# Patient Record
Sex: Male | Born: 1975
Health system: Southern US, Community
[De-identification: ages and names within clinical notes are randomized; demographics above are authoritative.]

## PROBLEM LIST (undated history)

## (undated) DIAGNOSIS — I5022 Chronic systolic (congestive) heart failure: Secondary | ICD-10-CM

## (undated) DIAGNOSIS — Z95810 Presence of automatic (implantable) cardiac defibrillator: Secondary | ICD-10-CM

## (undated) DIAGNOSIS — I509 Heart failure, unspecified: Secondary | ICD-10-CM

## (undated) DIAGNOSIS — G4733 Obstructive sleep apnea (adult) (pediatric): Secondary | ICD-10-CM

## (undated) DIAGNOSIS — I10 Essential (primary) hypertension: Secondary | ICD-10-CM

## (undated) DIAGNOSIS — Q210 Ventricular septal defect: Secondary | ICD-10-CM

## (undated) DIAGNOSIS — I428 Other cardiomyopathies: Secondary | ICD-10-CM

## (undated) HISTORY — DX: Ventricular septal defect: Q21.0

## (undated) HISTORY — PX: VSD REPAIR: SHX276

## (undated) HISTORY — DX: Heart failure, unspecified: I50.9

## (undated) HISTORY — DX: Obstructive sleep apnea (adult) (pediatric): G47.33

## (undated) HISTORY — DX: Essential (primary) hypertension: I10

## (undated) HISTORY — DX: Other cardiomyopathies: I42.8

---
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
## 1997-07-25 ENCOUNTER — Emergency Department (HOSPITAL_COMMUNITY): Admission: EM | Admit: 1997-07-25 | Discharge: 1997-07-25 | Payer: Self-pay | Admitting: Emergency Medicine

## 2006-02-13 ENCOUNTER — Encounter: Admission: RE | Admit: 2006-02-13 | Discharge: 2006-02-13 | Payer: Self-pay | Admitting: General Practice

## 2006-12-26 ENCOUNTER — Emergency Department (HOSPITAL_COMMUNITY): Admission: EM | Admit: 2006-12-26 | Discharge: 2006-12-26 | Payer: Self-pay | Admitting: Emergency Medicine

## 2007-02-28 ENCOUNTER — Inpatient Hospital Stay (HOSPITAL_COMMUNITY): Admission: EM | Admit: 2007-02-28 | Discharge: 2007-03-02 | Payer: Self-pay | Admitting: Emergency Medicine

## 2007-03-02 ENCOUNTER — Encounter (INDEPENDENT_AMBULATORY_CARE_PROVIDER_SITE_OTHER): Payer: Self-pay | Admitting: Emergency Medicine

## 2007-07-20 ENCOUNTER — Inpatient Hospital Stay (HOSPITAL_COMMUNITY): Admission: EM | Admit: 2007-07-20 | Discharge: 2007-07-27 | Payer: Self-pay | Admitting: Family Medicine

## 2007-08-26 ENCOUNTER — Ambulatory Visit (HOSPITAL_COMMUNITY): Admission: RE | Admit: 2007-08-26 | Discharge: 2007-08-26 | Payer: Self-pay | Admitting: Cardiology

## 2007-08-26 ENCOUNTER — Encounter (INDEPENDENT_AMBULATORY_CARE_PROVIDER_SITE_OTHER): Payer: Self-pay | Admitting: Cardiology

## 2009-02-12 ENCOUNTER — Observation Stay (HOSPITAL_COMMUNITY): Admission: EM | Admit: 2009-02-12 | Discharge: 2009-02-12 | Payer: Self-pay | Admitting: Emergency Medicine

## 2009-12-31 ENCOUNTER — Inpatient Hospital Stay (HOSPITAL_COMMUNITY): Admission: EM | Admit: 2009-12-31 | Discharge: 2010-01-04 | Payer: Self-pay | Admitting: Emergency Medicine

## 2009-12-31 ENCOUNTER — Emergency Department (HOSPITAL_COMMUNITY): Admission: EM | Admit: 2009-12-31 | Discharge: 2009-12-31 | Payer: Self-pay | Admitting: Emergency Medicine

## 2010-01-03 ENCOUNTER — Encounter (INDEPENDENT_AMBULATORY_CARE_PROVIDER_SITE_OTHER): Payer: Self-pay | Admitting: Internal Medicine

## 2010-06-27 LAB — CBC
HCT: 45.5 % (ref 39.0–52.0)
HCT: 48.3 % (ref 39.0–52.0)
Hemoglobin: 15.2 g/dL (ref 13.0–17.0)
Hemoglobin: 16 g/dL (ref 13.0–17.0)
MCHC: 33.4 g/dL (ref 30.0–36.0)
MCV: 97.6 fL (ref 78.0–100.0)
MCV: 97.6 fL (ref 78.0–100.0)
RBC: 4.95 MIL/uL (ref 4.22–5.81)
RDW: 13.1 % (ref 11.5–15.5)
WBC: 5.6 10*3/uL (ref 4.0–10.5)

## 2010-06-27 LAB — BASIC METABOLIC PANEL
BUN: 9 mg/dL (ref 6–23)
CO2: 30 mEq/L (ref 19–32)
CO2: 34 mEq/L — ABNORMAL HIGH (ref 19–32)
Calcium: 9.2 mg/dL (ref 8.4–10.5)
Chloride: 104 mEq/L (ref 96–112)
Chloride: 96 mEq/L (ref 96–112)
Creatinine, Ser: 1.01 mg/dL (ref 0.4–1.5)
GFR calc Af Amer: >60 mL/min (ref >60)
GFR calc Af Amer: >60 mL/min (ref >60)
GFR calc non Af Amer: >60 mL/min (ref >60)
Glucose, Bld: 159 mg/dL — ABNORMAL HIGH (ref 70–99)
Potassium: 3.8 mEq/L (ref 3.5–5.1)
Potassium: 4.6 mEq/L (ref 3.5–5.1)
Sodium: 139 mEq/L (ref 135–145)
Sodium: 140 mEq/L (ref 135–145)

## 2010-06-27 LAB — DIFFERENTIAL
Basophils Absolute: 0 10*3/uL (ref 0.0–0.1)
Basophils Relative: 0 % (ref 0–1)
Eosinophils Relative: 1 % (ref 0–5)
Eosinophils Relative: 1 % (ref 0–5)
Lymphocytes Relative: 15 % (ref 12–46)
Lymphocytes Relative: 29 % (ref 12–46)
Lymphs Abs: 0.8 10*3/uL (ref 0.7–4.0)
Monocytes Absolute: 0.6 10*3/uL (ref 0.1–1.0)
Monocytes Absolute: 0.6 10*3/uL (ref 0.1–1.0)
Monocytes Relative: 10 % (ref 3–12)
Monocytes Relative: 13 % — ABNORMAL HIGH (ref 3–12)

## 2010-06-27 LAB — POCT I-STAT, CHEM 8
BUN: 9 mg/dL (ref 6–23)
Calcium, Ion: 1.11 mmol/L — ABNORMAL LOW (ref 1.12–1.32)
Chloride: 100 mEq/L (ref 96–112)
Chloride: 110 mEq/L (ref 96–112)
Creatinine, Ser: 1.2 mg/dL (ref 0.4–1.5)
Glucose, Bld: 127 mg/dL — ABNORMAL HIGH (ref 70–99)
HCT: 42 % (ref 39.0–52.0)
Hemoglobin: 14.3 g/dL (ref 13.0–17.0)
Potassium: 2.6 mEq/L — CL (ref 3.5–5.1)
Sodium: 145 mEq/L (ref 135–145)
TCO2: 32 mmol/L (ref 0–100)

## 2010-06-27 LAB — GLUCOSE, CAPILLARY
Glucose-Capillary: 126 mg/dL — ABNORMAL HIGH (ref 70–99)
Glucose-Capillary: 131 mg/dL — ABNORMAL HIGH (ref 70–99)
Glucose-Capillary: 186 mg/dL — ABNORMAL HIGH (ref 70–99)
Glucose-Capillary: 187 mg/dL — ABNORMAL HIGH (ref 70–99)
Glucose-Capillary: 192 mg/dL — ABNORMAL HIGH (ref 70–99)
Glucose-Capillary: 212 mg/dL — ABNORMAL HIGH (ref 70–99)

## 2010-06-27 LAB — DIGOXIN LEVEL: Digoxin Level: <0.2 ng/mL — ABNORMAL LOW (ref 0.8–2.0)

## 2010-06-27 LAB — CK TOTAL AND CKMB (NOT AT ARMC): Total CK: 345 U/L — ABNORMAL HIGH (ref 7–232)

## 2010-06-27 LAB — MRSA PCR SCREENING: MRSA by PCR: POSITIVE — AB

## 2010-06-27 LAB — CARDIAC PANEL(CRET KIN+CKTOT+MB+TROPI)
CK, MB: 2.7 ng/mL (ref 0.3–4.0)
Troponin I: 0.03 ng/mL (ref 0.00–0.06)

## 2010-06-27 LAB — TROPONIN I: Troponin I: 0.01 ng/mL (ref 0.00–0.06)

## 2010-06-27 LAB — POTASSIUM: Potassium: 4.4 mEq/L (ref 3.5–5.1)

## 2010-07-17 LAB — GLUCOSE, CAPILLARY: Glucose-Capillary: 115 mg/dL — ABNORMAL HIGH (ref 70–99)

## 2010-07-17 LAB — WOUND CULTURE: Gram Stain: NONE SEEN

## 2010-08-27 NOTE — Discharge Summary (Signed)
Kevin Cardenas, Kevin Cardenas               ACCOUNT NO.:  [id number]   MEDICAL RECORD NO.:  [id number]          PATIENT TYPE:  INP   LOCATION:  4707                         FACILITY:  [facility]   PHYSICIAN:  Benito Mccreedy, M.D.DATE OF BIRTH:  1975-10-26   DATE OF ADMISSION:  02/28/2007  DATE OF DISCHARGE:                               DISCHARGE SUMMARY   CHIEF COMPLAINT:  Shortness of breath.   HISTORY OF PRESENT ILLNESS:  The patient is a 35 year old African  American gentleman with previous history of hole in heart, status post  repair as a child and hypertension.  He presented with 48 hour history  of shortness of breath.  Symptoms were proceeded by cold symptoms last  week when he walked in the rain.  The patient __________ at [employer]  where he works.  Shortness of breath had been stable, chest discomfort,  cough which is nonproductive and malaise.  He had not had any PND or  orthopnea.  He has felt a little bit dizzy.  On account of worsening  symptoms, the patient comes to the ED.  On admission to [facility] ED,  Dr. Dorie Rank, the patient was tachycardic with initial O2 saturation by  pulse oximetry, on room air, being 72%.  He was given oxygen by nasal  cannula with improvement in his oxygenation.  An x-ray was done, which  showed pneumonic infiltrates and the patient was admitted for further  evaluation and management in this regard.   PAST MEDICAL HISTORY:  As mentioned above.   PAST SURGICAL HISTORY:  The patient is status post open heart surgery as  a child, as stated above.   ALLERGIES:  HE DOES NOT HAVE ANY MEDICATION ALLERGIES, EXCEPT FOR  ASPIRIN INTOLERANCE WITH NAUSEA.   MEDICATIONS:  He takes:  1. Benicar 20 mg daily.  2. Lasix 25 mg daily p.r.n. for lower extremity edema.   FAMILY HISTORY:  Positive for heart disease in his family.   SOCIAL HISTORY:  The patient smokes cigarettes and drinks alcohol on a  social basis.   REVIEW OF SYSTEMS:  Twelve systems on 10  point systemic and __________  was unremarkable, except as note above.   PHYSICAL EXAMINATION:  VITAL SIGNS:  Blood pressure 102/67.  Temperature  was 99.4.  Pulse 109.  Respiratory rate was documented to be 22, but by  the time of my evaluation check by me was 18 per minute.  GENERAL:  Notable for morbid obese, African American gentleman, lying on  a stretcher in no acute cardiopulmonary distress.  HEENT:  Normocephalic, atraumatic.  Pupils equal, round and reactive to  light.  Extraocular movements intact.  Oropharynx moist.  NECK:  Supple.  No JVD.  LUNGS:  Diminished breath sounds at the bases.  Occasional rhonchi  without any wheezes.  CARDIOVASCULAR:  Tachycardic.  Rhythm appropriate with occasional  systolics.  No gallops.  A murmur appreciated in the log ER.  ABDOMEN:  Obese, soft, nontender.  Positive bowel sounds present.  EXTREMITIES:  No cyanosis and no edema noted.  NEURO:  The patient is alert and oriented x3.  No acute focal deficits.   LABORATORY DATA:  A 12-lead EKG showed fast heart sounds, tachycardia at  98 per minute and left anterior vesicular block upon __________  duration.  The patient had LVH.  X-ray of the chest as mentioned above  WBC count 8.9, hemoglobin 16.4, hematocrit 46.5, MCV 94.0, platelet  count 170.  Sodium 136, potassium 3.6, chloride 96, CO2 30, glucose 126,  BUN 8, creatinine 0.85, calcium 8.4, glomerular filtration rate  estimation for African American was more than 60 mL per minute.  B-type  natriuretic peptide was 164.0.   IMPRESSION:  1. Community acquired pneumonia.  2. Hypertensive heart disease with left ventricular hypertrophy.  3. Abnormal EKG with left anterior vesicular block of uncertain      duration.  4. Chest pain likely related to diagnosis number 1.  5. Hypertension.  6. Morbid obesity.   PLAN:  The patient was admitted for IV antibiotics, oxygen  supplementation, we will cycle his cardiac enzymes, we will check a 2D   echocardiogram because of his left ventricular hypertrophy, hypertensive  heart disease and abnormal EKG with left anterior vesicular block of  unknown duration with atypical chest pain, though likely related to his  pulmonary pathology.  The patient was seen and evaluated with blood  cultures x2 and further recommendations will be made according to  results.      Benito Mccreedy, M.D.  Electronically Signed     GO/MEDQ  D:  02/28/2007  T:  02/28/2007  Job:  [id number]

## 2010-08-27 NOTE — Cardiovascular Report (Signed)
Kevin Cardenas, Kevin Cardenas NO.:  [id number]   MEDICAL RECORD NO.:  [id number]          PATIENT TYPE:  INP   LOCATION:  2627                         FACILITY:  [facility]   PHYSICIAN:  Eden Lathe. Einar Gip, MD       DATE OF BIRTH:  Jan 01, 1976   DATE OF PROCEDURE:  07/22/2007  DATE OF DISCHARGE:                            CARDIAC CATHETERIZATION   PROCEDURE PERFORMED:  1. Right heart catheterization.  2. Left heart catheterization including:      a.     Left ventriculography.      b.     Selective right and left coronary arteriography.  3. Abdominal aortogram.   INDICATIONS:  Kevin Cardenas is a 35-year gentleman with history of  new-onset diabetes, history of hypertension, morbid obesity, obstructive  sleep apnea, who has found to have severe LV systolic dysfunction when  he was admitted to [facility] about 36 months ago.  He was  admitted with pneumonia that time.  He is now readmitted back with  shortness of breath and chest discomfort with positive cardiac markers.  Given his cardiomyopathy by echocardiogram and positive cardiac markers  suggestive of myocardial injury, he was brought to the cardiac  catheterization laboratory to evaluate his coronary anatomy.  He also  has history of ventricular septal defect status post pulmonary artery  banding followed by VSD repair and removal of the pulmonary artery band  at that time when he was 35 years of age.  Right heart catheterization is  being performed to evaluate for pulmonary hypertension.   HEMODYNAMIC DATA, RIGHT HEART CATHETERIZATION:  RA pressure 25/24, mean  22 mmHg.  Saturation 67%.  IVC saturation was 73%.   RV pressure 62/20 with end of pressure of 25 mmHg.   PA pressure 59/39, mean of 47 mmHg.  The PA saturation 56%.   Pulmonary capillary wedge pressure 37/41 with a mean of 37 mmHg.   Cardiac output was 6.21 with a cardiac index of 2.85 by FICK, and 4.95  and 2.27 respectively by thermodilution.   There was no shunting at the atrial or ventricular level.   HEMODYNAMIC DATA, LEFT HEART CATHETERIZATION:  Ventricular pressure was  981/19 with end-diastolic pressure of 30 mmHg.  Aortic pressure was  126/92 with a mean of 104 mmHg.  There was no pressure gradient across  the aortic valve.   ANGIOGRAPHIC DATA:  Left ventricle:  10 mL of contrast was injected and  the EF estimated to be around 35-40%.  Formal left ventriculography was  not performed given the fact that his wedge pressure mean was 37 mmHg.   Right coronary artery:  Right coronary artery is a large-caliber vessel  and a dominant vessel.  It is smooth and normal.   Left main coronary artery:  Left main coronary artery is a large-caliber  vessel that is smooth and normal.   Circumflex:  Circumflex is large-caliber vessel.  It is smooth and  normal.   Ramus intermediate:  Ramus intermedius is a large-caliber vessel.  It is  smooth and normal.   LAD:  LAD is large-caliber  vessel.  It is smooth and normal.   IMPRESSION:  1. Severe nonischemic dilated cardiomyopathy, ejection fraction of 35-      40%.  2. Moderate to severe pulmonary hypertension.  3. Congenital heart disease status post ventricular septal repair with      no residual left-to-right shunting by right heart catheterization.   RECOMMENDATIONS:  He will need medical therapy for now.  Should consider  placement of ICD for primary prevention of sudden cardiac death.  He did  have one episode of nonsustained ventricle tachycardia while he was in  the hospital.  Weight loss, exercise and diabetes control is indicated.   Right femoral arteriography was performed.  Arterial access closed with  StarClose with excellent hemostasis.   A total of 110 mL of contrast was utilized for diagnostic angiography.   TECHNIQUE OF THE PROCEDURE:  Under usual sterile precautions using a 7-  [place] right femoral venous and a 6-French right femoral arterial  access, a  balloon-tip Swan-Ganz catheter was advanced through the venous  access into the right atrium, right ventricle and pulmonary capillary  wedge position easily.  Right-sided hemodynamics were performed and then  the catheter was pulled out of body.   A 6-French multipurpose B2 catheter was advanced through the arterial  sheath into the left ventricle over a J-wire and left ventriculography  was performed in the RAO projection.  This same catheter was pulled into  the ascending aorta, right coronary artery selectively engaged and  angiography was performed.  Then the left main coronary artery was  selectively engaged and angiography was performed.  Then the catheter  was pulled back into the abdominal aorta and abdominal aortogram was  performed, then pulled out of the body.  The patient tolerated the  procedure.  Right femoral arteriography was performed through the  arterial access sheath and access closed with StarClose with excellent  hemostasis.  The venous access sheath was pulled in the catheterization  laboratory and adequate hemostasis also obtained.  The patient tolerated  the procedure.  No immediate complication.      Eden Lathe. Einar Gip, MD  Electronically Signed     JRG/MEDQ  D:  07/22/2007  T:  07/23/2007  Job:  [id number]   cc:   Nolene Ebbs, M.D.

## 2010-08-27 NOTE — H&P (Signed)
[name], Cardenas               ACCOUNT NO.:  [id number]   MEDICAL RECORD NO.:  [id number]          PATIENT TYPE:  INP   LOCATION:  2627                         FACILITY:  [facility]   PHYSICIAN:  Nolene Ebbs, M.D.    DATE OF BIRTH:  [birth year]   DATE OF ADMISSION:  07/20/2007  DATE OF DISCHARGE:                              HISTORY & PHYSICAL   PRESENTING COMPLAINTS:  Shortness of breath and leg swelling.   HISTORY OF PRESENT ILLNESS:  Kevin Cardenas is a 35 year old African American  gentleman with present medical history significant for congenital heart  disease, systemic hypertension and dyslipidemia.  He presented to the  emergency room at [facility] with 1-week history of swelling of  the legs and shortness of breath, progressively getting worse.  He  stated that he had been taking his medications and had not been using an  excessive amount of salt, but he noticed that his legs have been  progressively getting more swollen.  There was no pain and he denied  having any kind of injury.  He also noted that he became more easily  short of breath with mild exertion associated with two-pillow orthopnea  and paroxysmal nocturnal dyspnea.  He denied having any chest pain or  palpitations.  He had no nausea, diaphoresis, dizziness or hemoptysis.  He did not have any cough or sputum production and he had no fevers or  chills.  Due to progression of his symptoms, he decided to come to the  hospital.  At the emergency room his vital signs were stable.  He was  not in any acute respiratory or painful distress.  His initial  laboratory data with a chest x-ray that showed cardiomegaly with  congestive pattern and was thought to have developed congestive heart  failure and therefore admitted to the hospital for further evaluation  and appropriate management.   PAST MEDICAL HISTORY:  1. Congenital heart disease.  2. Systemic hypertension.  3. Dyslipidemia.   PAST SURGICAL HISTORY:   Heart surgery as a child to repair a hole in the  heart.   MEDICATION HISTORY:  1. Benicar/HCT 20/12.5 mg once a day.  2. Zocor 20 mg daily.  3. Aspirin 81 mg once a day.   ALLERGY HISTORY:  He says he is allergic to ASPIRIN, which upset his  stomach.   FAMILY AND SOCIAL HISTORY:  He lives with his mother and his sister.  He  is not married.  He has no children.  He works as a Engineer, maintenance (IT) at  [employer].  He denies any use of alcohol or illicit drugs.  He smokes  about half a pack of cigarettes daily.   REVIEW OF SYSTEMS:  CNS:  He denies any headaches, dizziness blurring of  vision, weakness of his extremities or slurring of speech.  CARDIAC:  As  above.  RESPIRATORY:  As above.  GI:  He has no nausea, vomiting,  diarrhea or constipation.  MUSCULOSKELETAL:  He had no joint pains or  joint swelling.  GU:  He has no dysuria, frequency or hematuria.   PHYSICAL EXAMINATION:  He is lying comfortably in the hospital bed at  about a 60-degree angle.  He is not in any acute respiratory or painful  distress.  VITAL SIGNS:  A blood pressure of 113/72, heart rate  of 88 and regular,  respiratory rate of 18, temperature 99.2, and O2 saturation on room air  initially on arrival was 88% on room air.  His pupils are equal and react to light and accommodation.  His oral  mucosa is moist.  NECK:  Supple with elevated jugular venous distention.  There is no  carotid bruit.  No cervical lymphadenopathy.  CHEST:  Reduced air entry with rales at the lung bases.  Heart sounds were not well-heard with no murmurs, no S3 gallops or rubs.  ABDOMEN:  Obese, soft and nontender, no masses.  Bowel sounds are  present.  EXTREMITIES:  2+ pitting edema of the foot, ankle and legs up to the  knees.  There is no calf tenderness or swelling.  Peripheral pulses are  present bilaterally.  CNS:  He is alert and oriented x3 with no focal neurological deficits.   LABORATORY DATA:  Chest x-ray shows moderate  congestive heart failure  with cardiomegaly.  BNP is 122.  I-STAT panel shows sodium 135,  potassium 4.5, chloride 95, BUN 16, creatinine 1.6.  Hemoglobin is 18.4,  hematocrit 54.  Point of care cardiac panel showed myoglobin 92.6, CK-MB  9.4 and troponins less than 0.05.  His white count is 7.4, hemoglobin  16.3, hematocrit 50.3, platelet count 207.  EKG shows normal sinus  rhythm with new T-wave inversions in the inferior and anterior leads.   ASSESSMENT:  Kevin Cardenas is a 27-year African American gentleman with  multiple coronary risk factors, who presented to the emergency room with  new symptoms and signs suggestive of congestive heart failure.   ADMISSION DIAGNOSES:  1. One congestive heart failure, decompensated.  2. Systemic hypertension, well-controlled.  3. History of dyslipidemia.  4. History of congenital heart disease, status post repair as a child.   PLAN OF CARE:  He will be admitted to a step-down unit.  He will be on  IV nitroglycerin at the 5 mcg, IV Lasix 40 mg q12 h., subcutaneous  Lovenox 40 mg daily, Protonix 40 mg once a day, aspirin 81 mg once a  day, lisinopril 10 mg once a day and Zocor 20 mg once a day.  A 2-D  echocardiogram be obtained.  He will be monitored on telemetry.  Serial  CKs and troponin will be performed q.8 h. x3.  Cardiology consult will  be requested for further evaluation.  Strict I&O's and daily weights  will be done.  He will be on a cardiac diet and activity as tolerated.  This plan of care has been discussed with him and his questions  answered.      Nolene Ebbs, M.D.  Electronically Signed     EA/MEDQ  D:  07/21/2007  T:  07/22/2007  Job:  [id number]

## 2010-08-30 NOTE — Discharge Summary (Signed)
NAMELEVI, KLAIBER NO.:  [id number]   MEDICAL RECORD NO.:  [id number]          PATIENT TYPE:  INP   LOCATION:  2010                         FACILITY:  [place] [facility]   PHYSICIAN:  Nolene Ebbs, M.D.    DATE OF BIRTH:  1975-10-07   DATE OF ADMISSION:  07/20/2007  DATE OF DISCHARGE:  07/27/2007                               DISCHARGE SUMMARY   HISTORY OF PRESENT ILLNESS:  Mr. Clydene Laming is a 35 year old African American  gentleman with past medical history significant for congenital heart  disease status post repair as a child, systemic hypertension, and  dyslipidemia.  He presented to the emergency room at [facility]  with 1-week history of shortness of breath and swelling of the legs  progressively getting worse.  He did not have any cough, sputum  production, fevers, or chills.  He had two-pillow orthopnea and  paroxysmal nocturnal dyspnea.  In the hospital emergency room, he was  not in acute respiratory or painful distress.  Initial laboratory data  showed chest x-ray with cardiomegaly and congestive heart.  He was,  therefore, admitted to the hospital for appropriate management.   HOSPITAL COURSE:  On admission, his BNP was 22.  He was placed on  intravenous nitroglycerin and intravenous Lasix, subcu Lovenox was given  for DVT prophylaxis and Protonix for GI prophylaxis with 2-D  echocardiogram, ejection fraction of 15-25% .  A Cardiology consult was  requested, and the patient was kindly seen by Dr. Adrian Prows who thought  that the patient had a cardiomyopathy of unknown etiology and also that  the patient to have obstructive sleep apnea, and wants to have a sleep  study done in the outpatient.  On July 22, 2007, he underwent cardiac  catheterization which revealed ejection fraction of 35-40% estimated.  He has no left-to-right shunting, he has moderate to severe pulmonary  hypertension, and severe nonischemic dilated cardiomyopathy.  Medical  therapy was  recommended and to be considered in future for biventricular  ICD.  On July 24, 2007, he had a low-grade temperature of 100.4 with  cough productive of yellowish sputum.  He was thought to have developed  acute bronchitis, and he was started on oral Avelox which he tolerated.  His Lasix was converted to p.o.  On July 27, 2007, he was feeling much  better and eager to go home.  His vital signs were stable.  His chest  was clear to auscultation.  Abdomen was benign.  She had no peripheral  leg edema, and was therefore considered stable for discharge home.   LABORATORY DATA:  His laboratory data were on July 26, 2007, sodium  123, potassium 3.8, chloride 90, bicarbonate of 34, BUN of 20,  creatinine 1.06, and glucose of 171.   CONDITION ON DISCHARGE:  Stable.   DISPOSITION:  His disposition was for home.  He is to follow up with me  in the office 1-2 weeks, with Dr. Einar Gip for sleep study on Aug 17, 2007.  He was advised not to return to work until evaluated in the office.  He  was discharged  home on oxygen 1-2 liters for ambulation and for  nighttime use.      Nolene Ebbs, M.D.  Electronically Signed     EA/MEDQ  D:  09/15/2007  T:  09/16/2007  Job:  [id number]

## 2010-09-22 ENCOUNTER — Emergency Department (HOSPITAL_COMMUNITY)
Admission: EM | Admit: 2010-09-22 | Discharge: 2010-09-22 | Disposition: A | Discharge status: Home / Self Care | Payer: BC Managed Care – PPO | Attending: Emergency Medicine | Admitting: Emergency Medicine

## 2010-09-22 ENCOUNTER — Emergency Department (HOSPITAL_COMMUNITY): Payer: BC Managed Care – PPO

## 2010-09-22 DIAGNOSIS — E119 Type 2 diabetes mellitus without complications: Secondary | ICD-10-CM | POA: Insufficient documentation

## 2010-09-22 DIAGNOSIS — H5789 Other specified disorders of eye and adnexa: Secondary | ICD-10-CM | POA: Insufficient documentation

## 2010-09-22 DIAGNOSIS — I10 Essential (primary) hypertension: Secondary | ICD-10-CM | POA: Insufficient documentation

## 2010-09-22 DIAGNOSIS — Z79899 Other long term (current) drug therapy: Secondary | ICD-10-CM | POA: Insufficient documentation

## 2010-09-22 DIAGNOSIS — Z760 Encounter for issue of repeat prescription: Secondary | ICD-10-CM | POA: Insufficient documentation

## 2010-09-22 DIAGNOSIS — I509 Heart failure, unspecified: Secondary | ICD-10-CM | POA: Insufficient documentation

## 2010-09-22 DIAGNOSIS — R42 Dizziness and giddiness: Secondary | ICD-10-CM | POA: Insufficient documentation

## 2010-09-22 DIAGNOSIS — R51 Headache: Secondary | ICD-10-CM | POA: Insufficient documentation

## 2010-09-22 LAB — DIFFERENTIAL
Eosinophils Relative: 0 % (ref 0–5)
Lymphocytes Relative: 17 % (ref 12–46)
Lymphs Abs: 1.3 10*3/uL (ref 0.7–4.0)
Monocytes Relative: 12 % (ref 3–12)

## 2010-09-22 LAB — POCT I-STAT, CHEM 8
BUN: 8 mg/dL (ref 6–23)
Chloride: 102 mEq/L (ref 96–112)
Creatinine, Ser: 0.9 mg/dL (ref 0.4–1.5)
Glucose, Bld: 114 mg/dL — ABNORMAL HIGH (ref 70–99)
HCT: 47 % (ref 39.0–52.0)
Potassium: 4.2 mEq/L (ref 3.5–5.1)

## 2010-09-22 LAB — CBC
HCT: 43.8 % (ref 39.0–52.0)
MCV: 93.8 fL (ref 78.0–100.0)
RDW: 12.6 % (ref 11.5–15.5)
WBC: 7.6 10*3/uL (ref 4.0–10.5)

## 2011-01-07 LAB — LIPID PANEL
Cholesterol: 105
HDL: 26 — ABNORMAL LOW
LDL Cholesterol: 64
Total CHOL/HDL Ratio: 4
Triglycerides: 73

## 2011-01-07 LAB — POCT I-STAT 3, VENOUS BLOOD GAS (G3P V)
Acid-Base Excess: 11 — ABNORMAL HIGH
Acid-Base Excess: 12 — ABNORMAL HIGH
Acid-Base Excess: 14 — ABNORMAL HIGH
Acid-Base Excess: 14 — ABNORMAL HIGH
Bicarbonate: 42.2 — ABNORMAL HIGH
Bicarbonate: 42.6 — ABNORMAL HIGH
O2 Saturation: 56
O2 Saturation: 67
O2 Saturation: 67
O2 Saturation: 73
Operator id: 250901
TCO2: 45
TCO2: 45
TCO2: 47
pH, Ven: 7.345 — ABNORMAL HIGH
pO2, Ven: 33
pO2, Ven: 39

## 2011-01-07 LAB — POCT I-STAT, CHEM 8
BUN: 16
Calcium, Ion: 0.94 — ABNORMAL LOW
Chloride: 95 — ABNORMAL LOW
Creatinine, Ser: 1.6 — ABNORMAL HIGH
Glucose, Bld: 145 — ABNORMAL HIGH

## 2011-01-07 LAB — POCT I-STAT 3, ART BLOOD GAS (G3+)
Acid-Base Excess: 15 — ABNORMAL HIGH
O2 Saturation: 89
TCO2: 46
pO2, Arterial: 58 — ABNORMAL LOW

## 2011-01-07 LAB — CBC
HCT: 48
HCT: 50.3
Hemoglobin: 15.6
Hemoglobin: 16.2
Hemoglobin: 16.3
MCHC: 32.7
MCHC: 33.5
MCHC: 33.7
MCV: 96.6
MCV: 96.8
Platelets: 178
Platelets: 192
RBC: 4.94
RBC: 4.98
RBC: 5.21
RDW: 14.3
RDW: 14.6
WBC: 7.4

## 2011-01-07 LAB — POCT CARDIAC MARKERS
Operator id: 277751
Troponin i, poc: <0.05

## 2011-01-07 LAB — CULTURE, BLOOD (ROUTINE X 2): Culture: NO GROWTH

## 2011-01-07 LAB — B-NATRIURETIC PEPTIDE (CONVERTED LAB)
Pro B Natriuretic peptide (BNP): 122 — ABNORMAL HIGH
Pro B Natriuretic peptide (BNP): 66
Pro B Natriuretic peptide (BNP): <30

## 2011-01-07 LAB — URINALYSIS, ROUTINE W REFLEX MICROSCOPIC
Glucose, UA: NEGATIVE
Hgb urine dipstick: NEGATIVE
Specific Gravity, Urine: 1.007
Urobilinogen, UA: 0.2

## 2011-01-07 LAB — DIFFERENTIAL
Eosinophils Relative: 1
Lymphocytes Relative: 18
Lymphs Abs: 1.3
Monocytes Absolute: 0.7
Monocytes Relative: 9

## 2011-01-07 LAB — BASIC METABOLIC PANEL
BUN: 20
CO2: 34 — ABNORMAL HIGH
CO2: 41 — ABNORMAL HIGH
CO2: 41 — ABNORMAL HIGH
Calcium: 8.3 — ABNORMAL LOW
Calcium: 8.6
Calcium: 8.8
Calcium: 8.9
Chloride: 87 — ABNORMAL LOW
Creatinine, Ser: 1.06
Creatinine, Ser: 1.11
Creatinine, Ser: 1.13
Creatinine, Ser: 1.2
GFR calc Af Amer: >60
GFR calc Af Amer: >60
GFR calc Af Amer: >60
GFR calc Af Amer: >60
GFR calc non Af Amer: >60
Glucose, Bld: 171 — ABNORMAL HIGH

## 2011-01-07 LAB — CARDIAC PANEL(CRET KIN+CKTOT+MB+TROPI): Total CK: 225

## 2011-01-07 LAB — URINE CULTURE
Culture: NO GROWTH
Special Requests: POSITIVE

## 2011-01-07 LAB — URINE DRUGS OF ABUSE SCREEN W ALC, ROUTINE (REF LAB)
Benzodiazepines.: NEGATIVE
Cocaine Metabolites: NEGATIVE
Opiate Screen, Urine: NEGATIVE
Propoxyphene: NEGATIVE

## 2011-01-07 LAB — APTT: aPTT: 29

## 2011-01-07 LAB — CK TOTAL AND CKMB (NOT AT ARMC): Total CK: 341 — ABNORMAL HIGH

## 2011-01-21 LAB — BASIC METABOLIC PANEL
BUN: 8
Calcium: 8.4
Chloride: 96
Creatinine, Ser: 0.85
GFR calc non Af Amer: >60
GFR calc non Af Amer: >60
Potassium: 3.9
Sodium: 139

## 2011-01-21 LAB — CBC
HCT: 45
Hemoglobin: 14.7
MCV: 94
Platelets: 170
WBC: 13 — ABNORMAL HIGH
WBC: 8.9

## 2011-01-21 LAB — LIPID PANEL
Cholesterol: 151
HDL: 37 — ABNORMAL LOW
LDL Cholesterol: 104 — ABNORMAL HIGH
Total CHOL/HDL Ratio: 4.1

## 2011-01-21 LAB — CARDIAC PANEL(CRET KIN+CKTOT+MB+TROPI)
CK, MB: 4.6 — ABNORMAL HIGH
Relative Index: 3.8 — ABNORMAL HIGH

## 2011-01-21 LAB — DIFFERENTIAL
Basophils Relative: 0
Eosinophils Absolute: 0.1 — ABNORMAL LOW
Lymphs Abs: 1.3
Neutro Abs: 6.7
Neutrophils Relative %: 75

## 2011-01-21 LAB — CK TOTAL AND CKMB (NOT AT ARMC)
Relative Index: 4 — ABNORMAL HIGH
Total CK: 189

## 2011-01-21 LAB — TROPONIN I: Troponin I: 0.04

## 2011-01-21 LAB — B-NATRIURETIC PEPTIDE (CONVERTED LAB): Pro B Natriuretic peptide (BNP): 164 — ABNORMAL HIGH

## 2011-01-21 LAB — CULTURE, BLOOD (ROUTINE X 2)

## 2011-01-24 LAB — CBC
MCHC: 34
MCV: 93.6
Platelets: 176
RBC: 4.62

## 2011-01-24 LAB — COMPREHENSIVE METABOLIC PANEL
Alkaline Phosphatase: 57
BUN: 9
Glucose, Bld: 96
Potassium: 3.2 — ABNORMAL LOW
Total Bilirubin: 0.9
Total Protein: 6.4

## 2011-01-24 LAB — POCT CARDIAC MARKERS
CKMB, poc: 2.4
Myoglobin, poc: 109

## 2011-01-24 LAB — DIFFERENTIAL
Basophils Absolute: 0
Basophils Relative: 0
Neutro Abs: 4.4
Neutrophils Relative %: 66

## 2011-01-28 ENCOUNTER — Emergency Department (HOSPITAL_COMMUNITY)
Admission: EM | Admit: 2011-01-28 | Discharge: 2011-01-28 | Disposition: A | Discharge status: Home / Self Care | Payer: BC Managed Care – PPO | Attending: Emergency Medicine | Admitting: Emergency Medicine

## 2011-01-28 DIAGNOSIS — E119 Type 2 diabetes mellitus without complications: Secondary | ICD-10-CM | POA: Insufficient documentation

## 2011-01-28 DIAGNOSIS — Z79899 Other long term (current) drug therapy: Secondary | ICD-10-CM | POA: Insufficient documentation

## 2011-01-28 DIAGNOSIS — I509 Heart failure, unspecified: Secondary | ICD-10-CM | POA: Insufficient documentation

## 2011-01-28 DIAGNOSIS — R05 Cough: Secondary | ICD-10-CM | POA: Insufficient documentation

## 2011-01-28 DIAGNOSIS — R059 Cough, unspecified: Secondary | ICD-10-CM | POA: Insufficient documentation

## 2011-01-28 DIAGNOSIS — I10 Essential (primary) hypertension: Secondary | ICD-10-CM | POA: Insufficient documentation

## 2011-01-28 DIAGNOSIS — J40 Bronchitis, not specified as acute or chronic: Secondary | ICD-10-CM | POA: Insufficient documentation

## 2011-01-30 ENCOUNTER — Encounter: Payer: Self-pay | Admitting: Internal Medicine

## 2011-01-30 ENCOUNTER — Ambulatory Visit (INDEPENDENT_AMBULATORY_CARE_PROVIDER_SITE_OTHER): Payer: BC Managed Care – PPO | Admitting: Internal Medicine

## 2011-01-30 ENCOUNTER — Ambulatory Visit (INDEPENDENT_AMBULATORY_CARE_PROVIDER_SITE_OTHER)
Admission: RE | Admit: 2011-01-30 | Discharge: 2011-01-30 | Disposition: A | Discharge status: Home / Self Care | Payer: BC Managed Care – PPO | Source: Ambulatory Visit | Attending: Internal Medicine | Admitting: Internal Medicine

## 2011-01-30 ENCOUNTER — Other Ambulatory Visit (INDEPENDENT_AMBULATORY_CARE_PROVIDER_SITE_OTHER): Payer: BC Managed Care – PPO

## 2011-01-30 VITALS — BP 120/86 | HR 88 | Temp 97.7°F | Resp 16 | Ht 66.0 in | Wt 202.0 lb

## 2011-01-30 DIAGNOSIS — IMO0001 Reserved for inherently not codable concepts without codable children: Secondary | ICD-10-CM

## 2011-01-30 DIAGNOSIS — Z23 Encounter for immunization: Secondary | ICD-10-CM

## 2011-01-30 DIAGNOSIS — I509 Heart failure, unspecified: Secondary | ICD-10-CM

## 2011-01-30 DIAGNOSIS — R7303 Prediabetes: Secondary | ICD-10-CM | POA: Insufficient documentation

## 2011-01-30 DIAGNOSIS — R011 Cardiac murmur, unspecified: Secondary | ICD-10-CM

## 2011-01-30 DIAGNOSIS — R05 Cough: Secondary | ICD-10-CM

## 2011-01-30 DIAGNOSIS — R059 Cough, unspecified: Secondary | ICD-10-CM

## 2011-01-30 LAB — COMPREHENSIVE METABOLIC PANEL
ALT: 19 U/L (ref 0–53)
AST: 19 U/L (ref 0–37)
Calcium: 8.9 mg/dL (ref 8.4–10.5)
Chloride: 105 mEq/L (ref 96–112)
Creatinine, Ser: 0.9 mg/dL (ref 0.4–1.5)
Potassium: 3.7 mEq/L (ref 3.5–5.1)

## 2011-01-30 LAB — LIPID PANEL
HDL: 47.4 mg/dL (ref >39.00)
LDL Cholesterol: 59 mg/dL (ref 0–99)
Total CHOL/HDL Ratio: 3
Triglycerides: 145 mg/dL (ref 0.0–149.0)
VLDL: 29 mg/dL (ref 0.0–40.0)

## 2011-01-30 LAB — HEMOGLOBIN A1C: Hgb A1c MFr Bld: 5.7 % (ref 4.6–6.5)

## 2011-01-30 LAB — CBC WITH DIFFERENTIAL/PLATELET
Basophils Absolute: 0 10*3/uL (ref 0.0–0.1)
Lymphocytes Relative: 36.8 % (ref 12.0–46.0)
Lymphs Abs: 1.4 10*3/uL (ref 0.7–4.0)
Monocytes Relative: 8.7 % (ref 3.0–12.0)
Platelets: 159 10*3/uL (ref 150.0–400.0)
RDW: 14.3 % (ref 11.5–14.6)

## 2011-01-30 LAB — BRAIN NATRIURETIC PEPTIDE: Pro B Natriuretic peptide (BNP): 68 pg/mL (ref 0.0–100.0)

## 2011-01-30 MED ORDER — FUROSEMIDE 20 MG PO TABS: 20.0000 mg | ORAL_TABLET | Freq: Two times a day (BID) | ORAL | Status: DC

## 2011-01-30 MED ORDER — SPIRONOLACTONE 25 MG PO TABS: 25.0000 mg | ORAL_TABLET | Freq: Every day | ORAL | Status: DC

## 2011-01-30 MED ORDER — SIMVASTATIN 20 MG PO TABS: 20.0000 mg | ORAL_TABLET | Freq: Every day | ORAL | Status: DC

## 2011-01-30 MED ORDER — POTASSIUM CHLORIDE 20 MEQ PO PACK: 20.0000 meq | PACK | Freq: Every day | ORAL | Status: DC

## 2011-01-30 MED ORDER — METFORMIN HCL 500 MG PO TABS: 500.0000 mg | ORAL_TABLET | Freq: Every day | ORAL | Status: DC

## 2011-01-30 MED ORDER — CARVEDILOL 6.25 MG PO TABS: 6.2500 mg | ORAL_TABLET | Freq: Two times a day (BID) | ORAL | Status: DC

## 2011-01-30 MED ORDER — DIGOXIN 250 MCG PO TABS: 250.0000 ug | ORAL_TABLET | Freq: Every day | ORAL | Status: DC

## 2011-01-30 NOTE — Assessment & Plan Note (Signed)
He needs an updated cardiology visit

## 2011-01-30 NOTE — Progress Notes (Signed)
Subjective:    Patient ID: Kevin Cardenas, male    DOB: [birth year], 35 y.o.   MRN: [id number]  HPI New to me this man is a poor historian and tells me that he was previously seeing a doctor in [place] but he does not know their name. He was seen in the ER  2 days ago for URI symptoms (cough productive of yellow phlegm, SOB, wheezing, ST) and was started on Zpak and continue with the same symptoms, he was given an inhaler but he has not started using it yet.  He tells me that he had open heart surgery as a child (?ASD repair) and has been treated for intermittent fluid retention since then with an ER visit for diuresis 4 months ago. He can't recall that last time he had any heart testing done.  He has had DM for about 15 years and does not know any recent lab results.   Review of Systems  Constitutional: Negative for fever, chills, diaphoresis, activity change, appetite change, fatigue and unexpected weight change.  HENT: Negative.   Eyes: Negative.   Respiratory: Positive for cough, shortness of breath and wheezing. Negative for apnea, choking, chest tightness and stridor.   Cardiovascular: Negative for chest pain, palpitations and leg swelling.  Gastrointestinal: Negative for nausea, vomiting, abdominal pain, diarrhea, constipation, blood in stool, abdominal distention, anal bleeding and rectal pain.  Genitourinary: Negative for dysuria, urgency, frequency, hematuria, flank pain, decreased urine volume, enuresis, difficulty urinating and genital sores.  Musculoskeletal: Negative for myalgias, back pain, joint swelling, arthralgias and gait problem.  Skin: Negative for color change, pallor, rash and wound.  Neurological: Negative for dizziness, tremors, seizures, syncope, facial asymmetry, speech difficulty, weakness, light-headedness, numbness and headaches.  Hematological: Negative for adenopathy. Does not bruise/bleed easily.  Psychiatric/Behavioral: Negative.        Objective:   Physical Exam  Vitals reviewed. Constitutional: He is oriented to person, place, and time. He appears well-developed and well-nourished. No distress.  HENT:  Head: Normocephalic and atraumatic.  Mouth/Throat: Oropharynx is clear and moist. No oropharyngeal exudate.  Eyes: Conjunctivae are normal. Right eye exhibits no discharge. Left eye exhibits no discharge. No scleral icterus.  Neck: Normal range of motion. Neck supple. No JVD present. No tracheal deviation present. No thyromegaly present.  Cardiovascular: Normal rate, regular rhythm and intact distal pulses.  Exam reveals no gallop and no friction rub.   Murmur heard. Pulmonary/Chest: No accessory muscle usage or stridor. Not tachypneic. No respiratory distress. He has no decreased breath sounds. He has no wheezes. He has rhonchi in the right upper field, the right middle field, the right lower field, the left upper field, the left middle field and the left lower field. He has no rales.  Abdominal: Soft. Bowel sounds are normal. He exhibits no distension and no mass. There is no tenderness. There is no rebound and no guarding.  Musculoskeletal: Normal range of motion. He exhibits no edema and no tenderness.  Lymphadenopathy:    He has no cervical adenopathy.  Neurological: He is alert and oriented to person, place, and time. He has normal reflexes. He displays normal reflexes. He exhibits normal muscle tone.  Skin: Skin is warm and dry. No rash noted. He is not diaphoretic. No erythema. No pallor.  Psychiatric: He has a normal mood and affect. His behavior is normal. Judgment and thought content normal.          Lab Results  Component Value Date   WBC 7.6 09/22/2010  HGB 16.0 09/22/2010   HCT 47.0 09/22/2010   PLT 154 09/22/2010   GLUCOSE 114* 09/22/2010   CHOL  Value: 105        ATP III CLASSIFICATION:  <200     mg/dL   Desirable  200-239  mg/dL   Borderline High  >=240    mg/dL   High 07/21/2007   TRIG 73 07/21/2007   HDL 26* 07/21/2007     LDLCALC  Value: 64        Total Cholesterol/HDL:CHD Risk Coronary Heart Disease Risk Table                     Men   Women  1/2 Average Risk   3.4   3.3 07/21/2007   ALT 67* 12/26/2006   AST 61* 12/26/2006   NA 135 09/22/2010   K 4.2 09/22/2010   CL 102 09/22/2010   CREATININE 0.90 09/22/2010   BUN 8 09/22/2010   CO2 34* 01/04/2010   TSH 0.659 12/31/2009   INR 1.1 07/22/2007   HGBA1C  Value: 6.8 (NOTE)   The ADA recommends the following therapeutic goals for glycemic   control related to Hgb A1C measurement:   Goal of Therapy:   < 7.0% Hgb A1C   Action Suggested:  > 8.0% Hgb A1C   Ref:  Diabetes Care, 22, Suppl. 1, 1999* 07/21/2007   Assessment & Plan:

## 2011-01-30 NOTE — Assessment & Plan Note (Signed)
I have asked him to start using the inhaler, I will check his CXR for edema, pna, etc.

## 2011-01-30 NOTE — Patient Instructions (Signed)
Diabetes, Type 2 Diabetes is a long-lasting (chronic) disease. In type 2 diabetes, the pancreas does not make enough insulin (a hormone), and the body does not respond normally to the insulin that is made. This type of diabetes was also previously called adult-onset diabetes. It usually occurs after the age of 54, but it can occur at any age.  CAUSES  Type 2 diabetes happens because the pancreasis not making enough insulin or your body has trouble using the insulin that your pancreas does make properly. SYMPTOMS   Drinking more than usual.   Urinating more than usual.   Blurred vision.   Dry, itchy skin.   Frequent infections.   Feeling more tired than usual (fatigue).  DIAGNOSIS The diagnosis of type 2 diabetes is usually made by one of the following tests:  Fasting blood glucose test. You will not eat for at least 8 hours and then take a blood test.   Random blood glucose test. Your blood glucose (sugar) is checked at any time of the day regardless of when you ate.   Oral glucose tolerance test (OGTT). Your blood glucose is measured after you have not eaten (fasted) and then after you drink a glucose containing beverage.  TREATMENT   Healthy eating.   Exercise.   Medicine, if needed.   Monitoring blood glucose.   Seeing your caregiver regularly.  HOME CARE INSTRUCTIONS   Check your blood glucose at least once a day. More frequent monitoring may be necessary, depending on your medicines and on how well your diabetes is controlled. Your caregiver will advise you.   Take your medicine as directed by your caregiver.   Do not smoke.   Make wise food choices. Ask your caregiver for information. Weight loss can improve your diabetes.   Learn about low blood glucose (hypoglycemia) and how to treat it.   Get your eyes checked regularly.   Have a yearly physical exam. Have your blood pressure checked and your blood and urine tested.   Wear a pendant or bracelet saying  that you have diabetes.   Check your feet every night for cuts, sores, blisters, and redness. Let your caregiver know if you have any problems.  SEEK MEDICAL CARE IF:   You have problems keeping your blood glucose in target range.   You have problems with your medicines.   You have symptoms of an illness that do not improve after 24 hours.   You have a sore or wound that is not healing.   You notice a change in vision or a new problem with your vision.   You have a fever.  MAKE SURE YOU:  Understand these instructions.   Will watch your condition.   Will get help right away if you are not doing well or get worse.  Document Released: 03/31/2005 Document Revised: 12/12/2010 Document Reviewed: 09/16/2010 [facility] Patient Information 2012 [place].

## 2011-01-30 NOTE — Assessment & Plan Note (Signed)
I will check his A1C and will monitor his renal function

## 2011-01-30 NOTE — Assessment & Plan Note (Addendum)
I will check a BNP to see if he is in fluid retention, his EKG today does show LVH but there is no acute pathology. Will check other labs as well and have asked him to see cardiology asap, I will refill all of his prior meds

## 2011-01-31 LAB — URINALYSIS, ROUTINE W REFLEX MICROSCOPIC
Hgb urine dipstick: NEGATIVE
Total Protein, Urine: NEGATIVE
Urine Glucose: NEGATIVE
pH: 6 (ref 5.0–8.0)

## 2011-01-31 LAB — DIGOXIN LEVEL: Digoxin Level: <0.1 ng/mL — ABNORMAL LOW (ref 0.8–2.0)

## 2011-02-04 ENCOUNTER — Encounter: Payer: Self-pay | Admitting: Cardiovascular Disease

## 2011-02-06 ENCOUNTER — Ambulatory Visit (INDEPENDENT_AMBULATORY_CARE_PROVIDER_SITE_OTHER): Payer: BC Managed Care – PPO | Admitting: Cardiovascular Disease

## 2011-02-06 ENCOUNTER — Encounter: Payer: Self-pay | Admitting: Cardiovascular Disease

## 2011-02-06 VITALS — BP 131/81 | HR 89 | Ht 66.0 in | Wt 199.8 lb

## 2011-02-06 DIAGNOSIS — I272 Pulmonary hypertension, unspecified: Secondary | ICD-10-CM | POA: Insufficient documentation

## 2011-02-06 DIAGNOSIS — Q210 Ventricular septal defect: Secondary | ICD-10-CM

## 2011-02-06 DIAGNOSIS — I2789 Other specified pulmonary heart diseases: Secondary | ICD-10-CM

## 2011-02-06 DIAGNOSIS — I428 Other cardiomyopathies: Secondary | ICD-10-CM | POA: Insufficient documentation

## 2011-02-06 DIAGNOSIS — I509 Heart failure, unspecified: Secondary | ICD-10-CM

## 2011-02-06 DIAGNOSIS — I10 Essential (primary) hypertension: Secondary | ICD-10-CM | POA: Insufficient documentation

## 2011-02-06 DIAGNOSIS — I5022 Chronic systolic (congestive) heart failure: Secondary | ICD-10-CM | POA: Insufficient documentation

## 2011-02-06 MED ORDER — LISINOPRIL 5 MG PO TABS: 5.0000 mg | ORAL_TABLET | Freq: Every day | ORAL | Status: DC

## 2011-02-06 NOTE — Assessment & Plan Note (Signed)
No assessment since 2009. Will refer to Dr. Haroldine Laws.

## 2011-02-06 NOTE — Progress Notes (Signed)
History of Present Illness:35 yo male with history of VSD repair as a child, DM, HTN, obesity and obstructive sleep apnea referred today to establish cardiology care. He was recently seen by Dr. Ronnald Ramp in primary care and was referred to our office. He is a poor historian but review of records from [facility] over past few years shines some light on his past. He had a VSD repair and removal of pulmonary artery band at age 51. He had a cardiac cath in April 2009 per Dr. Einar Gip that showed PA pressure 59/39. Pulmonary capillary wedge pressure was 37. Cardiac output was 6.21 L/min. There were no shunts. His LVEF was 35-40%. Coronary arteries were normal. He was admitted to [facility] September 2011 with SOB/volume overload and was diuresed. Echo September 2011 with LVEF of 20-25%. There was severe LVH. He has had no cardiac workup since then. There is some mention in the chart of non-sustained VT. No discussions about ICD in the past.   He is doing well. He denies any chest pain, SOB, or palpitations. His weight has been stable. No lower extremity edema. He is working without any problems.    Past Medical History  Diagnosis Date  . Diabetes mellitus   . Hypertension   . CHF (congestive heart failure)   . VSD (ventricular septal defect)   . Non-ischemic cardiomyopathy   . Obstructive sleep apnea     Past Surgical History  Procedure Date  . Vsd repair     Current Outpatient Prescriptions  Medication Sig Dispense Refill  . albuterol (PROVENTIL HFA;VENTOLIN HFA) 108 (90 BASE) MCG/ACT inhaler Inhale 2 puffs into the lungs every 6 (six) hours as needed.        . carvedilol (COREG) 6.25 MG tablet Take 1 tablet (6.25 mg total) by mouth 2 (two) times daily with a meal.  60 tablet  11  . digoxin (LANOXIN) 0.25 MG tablet Take 1 tablet (250 mcg total) by mouth daily.  30 tablet  11  . furosemide (LASIX) 20 MG tablet Take 1 tablet (20 mg total) by mouth 2 (two) times daily.  60 tablet  11  .  metFORMIN (GLUCOPHAGE) 500 MG tablet Take 1 tablet (500 mg total) by mouth daily.  30 tablet  11  . potassium chloride (KLOR-CON) 20 MEQ packet Take 20 mEq by mouth daily.  30 packet  11  . simvastatin (ZOCOR) 20 MG tablet Take 1 tablet (20 mg total) by mouth at bedtime.  30 tablet  11  . spironolactone (ALDACTONE) 25 MG tablet Take 1 tablet (25 mg total) by mouth daily.  30 tablet  11    No Known Allergies  History   Social History  . Marital Status: Single    Spouse Name: N/A    Number of Children: 0  . Years of Education: N/A   Occupational History  . Works at [employer]  . Smoking status: Former Smoker -- 0.3 packs/day for 15 years    Types: Cigarettes  . Smokeless tobacco: Never Used  . Alcohol Use: 3.0 oz/week    5 Cans of beer per week  . Drug Use: No  . Sexually Active: Yes   Other Topics Concern  . Not on file   Social History Narrative   Caffienated drinks-noSeat belt use often-noRegular Exercise-Smoke alarm in the home-yesFirearms/guns in the home-noHistory of physical abuse-no    Family History  Problem Relation Age of Onset  . Lung cancer  Father   . Stroke Father   . Diabetes Maternal Grandfather   . Diabetes Paternal Grandmother     Review of Systems:  As stated in the HPI and otherwise negative.   BP 131/81  Pulse 89  Ht _0  (1.676 m)  Wt 199 lb 12.8 oz (90.629 kg)  BMI 32.25 kg/m2  Physical Examination: General: Well developed, well nourished, NAD HEENT: OP clear, mucus membranes moist SKIN: warm, dry. No rashes. Neuro: No focal deficits Musculoskeletal: Muscle strength 5/5 all ext Psychiatric: Mood and affect normal Neck: No JVD, no carotid bruits, no thyromegaly, no lymphadenopathy. Lungs:Clear bilaterally, no wheezes, rhonci, crackles Cardiovascular: Regular rate and rhythm. No murmurs. +S3 gallop. No rubs. Abdomen:Soft. Bowel sounds present. Non-tender.  Extremities: No lower extremity edema. Pulses are 2 +  in the bilateral DP/PT.

## 2011-02-06 NOTE — Assessment & Plan Note (Signed)
Volume status is ok. Only change in therapy is addition of Lisinopril.

## 2011-02-06 NOTE — Assessment & Plan Note (Signed)
Will continue his current medical regimen. Volume ok with Lasix 40 mg po once daily. Will add Lisinopril 5 mg po Qdaily. Will order echocardiogram to assess LV function. He will need to be considered for an ICD in the future. Given his non-ischemic CM and his pulmonary HTN with h/o congenital heart disease and prior VSD repair, I will refer him to the [facility] to see Dr. Haroldine Laws. He is in agreement with this plan.

## 2011-02-06 NOTE — Patient Instructions (Signed)
Your physician recommends that you schedule a follow-up appointment in 4 weeks with Dr. Haroldine Laws in the [facility].   Your physician has requested that you have an echocardiogram. Echocardiography is a painless test that uses sound waves to create images of your heart. It provides your doctor with information about the size and shape of your heart and how well your heart's chambers and valves are working. This procedure takes approximately one hour. There are no restrictions for this procedure.  Your physician has recommended you make the following change in your medication: Start Lisinopril 5 mg by mouth daily.

## 2011-02-06 NOTE — Assessment & Plan Note (Signed)
No evidence of shunting on right heart cath April 2009. No VSD present on echo September 2011. Repeat echo.

## 2011-02-13 ENCOUNTER — Ambulatory Visit (HOSPITAL_COMMUNITY): Payer: BC Managed Care – PPO | Attending: Cardiovascular Disease | Admitting: Radiology

## 2011-02-13 DIAGNOSIS — I428 Other cardiomyopathies: Secondary | ICD-10-CM

## 2011-02-13 DIAGNOSIS — I10 Essential (primary) hypertension: Secondary | ICD-10-CM | POA: Insufficient documentation

## 2011-02-13 DIAGNOSIS — I059 Rheumatic mitral valve disease, unspecified: Secondary | ICD-10-CM | POA: Insufficient documentation

## 2011-02-13 DIAGNOSIS — I379 Nonrheumatic pulmonary valve disorder, unspecified: Secondary | ICD-10-CM | POA: Insufficient documentation

## 2011-02-13 DIAGNOSIS — E119 Type 2 diabetes mellitus without complications: Secondary | ICD-10-CM | POA: Insufficient documentation

## 2011-02-13 DIAGNOSIS — R011 Cardiac murmur, unspecified: Secondary | ICD-10-CM | POA: Insufficient documentation

## 2011-02-13 DIAGNOSIS — I079 Rheumatic tricuspid valve disease, unspecified: Secondary | ICD-10-CM | POA: Insufficient documentation

## 2011-02-20 ENCOUNTER — Ambulatory Visit: Payer: BC Managed Care – PPO | Admitting: Internal Medicine

## 2011-02-20 DIAGNOSIS — Z0289 Encounter for other administrative examinations: Secondary | ICD-10-CM

## 2011-03-11 ENCOUNTER — Ambulatory Visit (HOSPITAL_COMMUNITY)
Admission: RE | Admit: 2011-03-11 | Discharge: 2011-03-11 | Disposition: A | Discharge status: Home / Self Care | Payer: BC Managed Care – PPO | Source: Ambulatory Visit | Attending: Internal Medicine | Admitting: Internal Medicine

## 2011-03-11 ENCOUNTER — Other Ambulatory Visit (HOSPITAL_COMMUNITY): Payer: Self-pay | Admitting: Pharmacist

## 2011-03-11 ENCOUNTER — Encounter (HOSPITAL_COMMUNITY): Payer: Self-pay

## 2011-03-11 VITALS — BP 114/68 | HR 81 | Wt 198.0 lb

## 2011-03-11 DIAGNOSIS — I509 Heart failure, unspecified: Secondary | ICD-10-CM

## 2011-03-11 DIAGNOSIS — I5022 Chronic systolic (congestive) heart failure: Secondary | ICD-10-CM | POA: Insufficient documentation

## 2011-03-11 DIAGNOSIS — I428 Other cardiomyopathies: Secondary | ICD-10-CM

## 2011-03-11 LAB — BASIC METABOLIC PANEL
BUN: 15 mg/dL (ref 6–23)
Calcium: 8.7 mg/dL (ref 8.4–10.5)
GFR calc Af Amer: >90 mL/min (ref >90)
GFR calc non Af Amer: >90 mL/min (ref >90)
Potassium: 4.2 mEq/L (ref 3.5–5.1)

## 2011-03-11 MED ORDER — CARVEDILOL 6.25 MG PO TABS: 9.3750 mg | ORAL_TABLET | Freq: Two times a day (BID) | ORAL | Status: DC

## 2011-03-11 MED ORDER — ALBUTEROL SULFATE HFA 108 (90 BASE) MCG/ACT IN AERS: 2.0000 | INHALATION_SPRAY | Freq: Four times a day (QID) | RESPIRATORY_TRACT | Status: DC | PRN

## 2011-03-11 MED ORDER — LISINOPRIL 5 MG PO TABS: 5.0000 mg | ORAL_TABLET | Freq: Two times a day (BID) | ORAL | Status: DC

## 2011-03-11 NOTE — Assessment & Plan Note (Addendum)
Discussed role of HF clinic in full with the patient and his wife.  Volume status looks good today.  NYHA I-II.  Currently patient is not on optimal doses of his HF medications therefore at this time will titrate coreg to 9.375 mg BID and lisinopril to 5 mg BID.  Will continue to optimize dosages prior to evaluation for ICD implantation.  Currently with NYHA I-II symptoms digoxin is not recommended, will discontinue at this time.  Discussed need for daily weights, scale given and sliding scale lasix discussed.  Reviewed low sodium diet and fluid restrictions.  Patient and his wife voiced understanding.  Patient seen and examined with Leone Haven PA-C. We discussed all aspects of the encounter. I agree with the assessment and plan as stated above.   Greater than 50 min spent with the patient and 50% of the time spent educating about diet, weight and medication compliance.

## 2011-03-11 NOTE — Progress Notes (Signed)
HPI:  Kevin Cardenas is a 35 y.o. gentlemen with history of VSD repair and removal of a pulmonary artery band at age 66, DM, HTN, systolic heart failure, NICM and OSA with CPAP but doesn't wear it all the time.  Cardiac cath in April 2009 per Dr. Einar Gip that showed His LVEF was 35-40%. Coronary arteries were normal. RA pressure 25/24, mean 22 mmHg.  Saturation 67%.  IVC saturation was 73%. RV pressure 62/20 with end of pressure of 25 mmHg. PA pressure 59/39, mean of 47 mmHg.  The PA saturation 56%. Pulmonary capillary wedge pressure 37/41 with a mean of 37 mmHg. Cardiac output was 6.21 with a cardiac index of 2.85 by FICK, and 4.95 and 2.27 respectively by thermodilution.  There was no shunting at the atrial or ventricular level.  Echo 9/11: LVEF 20-25%  Echo November 1st, 2012.  LVEF 30-35% Diffuse hypokinesis and mild LVH.  RV systolic mildly reduced.  LA mildly dilated.  Pulmonic valve with mild stenosis.    The patient has been referred by Dr. Angelena Form for further management of his heart failure.  He feels well today.  He occasionally takes extra lasix when he feels dyspneic or has lower extremity edema.  He took an extra lasix last week.  He does not weigh himself daily but is agreeable to getting a scale.  He has been out of his digoxin for several days, he is otherwise compliant with his medications.  He denies SOB/othopnea or PND.  He tries to wear his CPAP but isn't always compliant. No CP.    Recently married to high school sweetheart who wants to keep the patient on track with his medications and diet.  He continues to use tobacco occasionally and rarely uses ETOH.  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  Occasionally is SOB after work.      Review of Systems:     Cardiac Review of Systems: {Y] = yes _0  = no  Chest Pain [    ]  Resting SOB [   ] Exertional SOB  [  ]  Orthopnea [  ]   Pedal Edema [   ]    Palpitations [  ] Syncope   [  ]   Presyncope [   ]  General Review of Systems: [Y] = yes [  ]=no Constitional: recent weight change [  ]; anorexia [  ]; fatigue [  ]; nausea [  ]; night sweats [  ]; fever [  ]; or chills [  ];                                                                                                                                          Dental: poor dentition[  ];   Eye : blurred vision [  ]; diplopia [   ];  vision changes [  ];  Amaurosis fugax[  ]; Resp: cough [  ];  wheezing[  ];  hemoptysis[  ]; shortness of breath[  ]; paroxysmal nocturnal dyspnea[  ]; dyspnea on exertion[  ]; or orthopnea[  ];  GI:  gallstones[  ], vomiting[  ];  dysphagia[  ]; melena[  ];  hematochezia [  ]; heartburn[  ];   Hx of  Colonoscopy[  ]; GU: kidney stones [  ]; hematuria[  ];   dysuria [  ];  nocturia[  ];  history of     obstruction [  ];                 Skin: rash, swelling[  ];, hair loss[  ];  peripheral edema[  ];  or itching[  ]; Musculosketetal: myalgias[  ];  joint swelling[  ];  joint erythema[  ];  joint pain[  ];  back pain[  ];  Heme/Lymph: bruising[  ];  bleeding[  ];  anemia[  ];  Neuro: TIA[  ];  headaches[  ];  stroke[  ];  vertigo[  ];  seizures[  ];   paresthesias[  ];  difficulty walking[  ];  Psych:depression[  ]; anxiety[  ];  Endocrine: diabetes[  ];  thyroid dysfunction[  ];  Immunizations: Flu [  ]; Pneumococcal[  ];  Other:    Past Medical History  Diagnosis Date  . Diabetes mellitus   . Hypertension   . CHF (congestive heart failure)   . VSD (ventricular septal defect)   . Non-ischemic cardiomyopathy   . Obstructive sleep apnea     Current Outpatient Prescriptions  Medication Sig Dispense Refill  . acetaminophen (TYLENOL) 500 MG tablet Take 1,000 mg by mouth as needed.        Marland Kitchen albuterol (PROVENTIL HFA;VENTOLIN HFA) 108 (90 BASE) MCG/ACT inhaler Inhale 2 puffs into the lungs every 6 (six) hours as needed.        . carvedilol (COREG) 6.25 MG tablet Take 1 tablet (6.25 mg  total) by mouth 2 (two) times daily with a meal.  60 tablet  11  . digoxin (LANOXIN) 0.25 MG tablet Take 1 tablet (250 mcg total) by mouth daily.  30 tablet  11  . furosemide (LASIX) 20 MG tablet Take 1 tablet (20 mg total) by mouth 2 (two) times daily.  60 tablet  11  . lisinopril (PRINIVIL,ZESTRIL) 5 MG tablet Take 1 tablet (5 mg total) by mouth daily.  30 tablet  11  . metFORMIN (GLUCOPHAGE) 500 MG tablet Take 1 tablet (500 mg total) by mouth daily.  30 tablet  11  . potassium chloride SA (K-DUR,KLOR-CON) 20 MEQ tablet Take 20 mEq by mouth daily.        . simvastatin (ZOCOR) 20 MG tablet Take 1 tablet (20 mg total) by mouth at bedtime.  30 tablet  11  . spironolactone (ALDACTONE) 25 MG tablet Take 1 tablet (25 mg total) by mouth daily.  30 tablet  11     No Known Allergies  History   Social History  . Marital Status: Single    Spouse Name: Recently Married     Number of Children: 0  . Years of Education: N/A   Occupational History  . 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    Social History Main Topics  . Smoking status: Current Some Day Smoker -- 0.3 packs/day for 15 years  Types: Cigarettes  . Smokeless tobacco: Never Used   Comment: Smokes depends on who is around him.    . Alcohol Use: 0.0 oz/week     Rarely   . Drug Use: No  . Sexually Active: Yes   Other Topics Concern  . Not on file   Social History Narrative   Caffienated drinks-noSeat belt use often-noRegular Exercise-Smoke alarm in the home-yesFirearms/guns in the home-noHistory of physical abuse-no    Family History  Problem Relation Age of Onset  . Lung cancer Father   . Stroke Father   . Diabetes Maternal Grandfather   . Diabetes Paternal Grandmother   . Stroke Mother     PHYSICAL EXAM: Filed Vitals:   03/11/11 1113  BP: 98/60  Pulse: 81  Wt 198  General:  Well appearing. No respiratory difficulty HEENT: normal Neck: supple. JVD 6-7.  Carotids 2+ bilat; no  bruits. No lymphadenopathy or thryomegaly appreciated. Cor: PMI nondisplaced. Regular rate & rhythm. No rubs, gallops or murmurs. Lungs: clear Abdomen: soft, nontender, nondistended. No hepatosplenomegaly. No bruits or masses. Good bowel sounds. Extremities: no cyanosis, clubbing, rash, edema Neuro: alert & oriented x 3, cranial nerves grossly intact. moves all 4 extremities w/o difficulty. Affect pleasant.    ASSESSMENT & PLAN:

## 2011-03-11 NOTE — Patient Instructions (Signed)
Stop digoxin.  Increase Carvedilol 9.375 mg (1.5 tabs) twice daily.  Increase Lisinopril 5 mg twice daily.  Weigh yourself daily, call if increase in 3-4 lbs over night.    Follow up in 4 weeks.   Labs today.

## 2011-03-14 NOTE — Progress Notes (Signed)
Patient seen and examined with Leone Haven PA-C. We discussed all aspects of the encounter. I agree with the assessment and plan as stated above.

## 2011-04-22 ENCOUNTER — Ambulatory Visit (HOSPITAL_COMMUNITY)
Admission: RE | Admit: 2011-04-22 | Discharge: 2011-04-22 | Disposition: A | Discharge status: Home / Self Care | Payer: BC Managed Care – PPO | Source: Ambulatory Visit | Attending: Internal Medicine | Admitting: Internal Medicine

## 2011-04-22 ENCOUNTER — Encounter (HOSPITAL_COMMUNITY): Payer: Self-pay

## 2011-04-22 VITALS — BP 110/74 | HR 79 | Wt 195.8 lb

## 2011-04-22 DIAGNOSIS — I5022 Chronic systolic (congestive) heart failure: Secondary | ICD-10-CM

## 2011-04-22 DIAGNOSIS — I509 Heart failure, unspecified: Secondary | ICD-10-CM

## 2011-04-22 LAB — BASIC METABOLIC PANEL
BUN: 10 mg/dL (ref 6–23)
Calcium: 9.1 mg/dL (ref 8.4–10.5)
Chloride: 106 mEq/L (ref 96–112)
Creatinine, Ser: 0.85 mg/dL (ref 0.50–1.35)
GFR calc Af Amer: >90 mL/min (ref >90)
GFR calc non Af Amer: >90 mL/min (ref >90)

## 2011-04-22 MED ORDER — CARVEDILOL 6.25 MG PO TABS: 12.5000 mg | ORAL_TABLET | Freq: Two times a day (BID) | ORAL | Status: DC

## 2011-04-22 MED ORDER — LISINOPRIL 5 MG PO TABS: 5.0000 mg | ORAL_TABLET | Freq: Two times a day (BID) | ORAL | Status: DC

## 2011-04-22 NOTE — Progress Notes (Signed)
HPI:  Kevin Cardenas is a 36 y.o. gentlemen with history of VSD repair and removal of a pulmonary artery band at age 6, DM, HTN, systolic heart failure, NICM and OSA with CPAP but doesn't wear it all the time.   Cardiac cath in April 2009 per Dr. Einar Gip that showed His LVEF was 35-40%. Coronary arteries were normal. RA pressure 25/24, mean 22 mmHg. Saturation 67%. IVC saturation was 73%. RV pressure 62/20 with end of pressure of 25 mmHg. PA pressure 59/39, mean of 47 mmHg. The PA saturation 56%. Pulmonary capillary wedge pressure 37/41 with a mean of 37 mmHg. Cardiac output was 6.21 with a cardiac index of 2.85 by FICK, and 4.95 and 2.27 respectively by thermodilution. There was no shunting at the atrial or ventricular level.   Echo 9/11: LVEF 20-25%   Echo November 1st, 2012. LVEF 30-35% Diffuse hypokinesis and mild LVH. RV systolic mildly reduced. LA mildly dilated. Pulmonic valve with mild stenosis.   The patient has been referred by Dr. Angelena Form for further management of his heart failure. Recently married to high school sweetheart who wants to keep the patient on track with his medications and diet. He continues to use tobacco occasionally and rarely uses ETOH. He works at [employer].    He returns for follow up today.  Coreg increased 9.375 mg BID, potassium stopped last visit.  He was suppose to increase lisinopril as well but this did not happen.  He feels well.  No c/o SOB/orthopnea/PND/CP.  He has to walk up the hill to get to the bus stop and he has no issues with this.  He has sliding scale lasix but has not needed this since last visit.  He weighs daily, stable at home.  He is compliant with all meds.  No extra lasix.  Weight steady at home.  Exercising some, walking and lifting weights.     ROS: All systems negative except as listed in HPI, PMH and Problem List.  Past Medical History  Diagnosis Date  . Diabetes mellitus   . Hypertension   . VSD (ventricular septal defect)   .  Non-ischemic cardiomyopathy   . Obstructive sleep apnea   . CHF (congestive heart failure)     Systolic, EF 38% per echo 11/12    Current Outpatient Prescriptions  Medication Sig Dispense Refill  . acetaminophen (TYLENOL) 500 MG tablet Take 1,000 mg by mouth as needed.        Marland Kitchen aspirin 81 MG tablet Take 81 mg by mouth daily.        . carvedilol (COREG) 6.25 MG tablet Take 1.5 tablets (9.375 mg total) by mouth 2 (two) times daily with a meal.  90 tablet  11  . furosemide (LASIX) 20 MG tablet Take 1 tablet (20 mg total) by mouth 2 (two) times daily.  60 tablet  11  . lisinopril (PRINIVIL,ZESTRIL) 5 MG tablet Take 5 mg by mouth daily.        . metFORMIN (GLUCOPHAGE) 500 MG tablet Take 1 tablet (500 mg total) by mouth daily.  30 tablet  11  . simvastatin (ZOCOR) 20 MG tablet Take 1 tablet (20 mg total) by mouth at bedtime.  30 tablet  11  . spironolactone (ALDACTONE) 25 MG tablet Take 1 tablet (25 mg total) by mouth daily.  30 tablet  11  . DISCONTD: lisinopril (PRINIVIL,ZESTRIL) 5 MG tablet Take 1 tablet (5 mg total) by mouth 2 (two) times daily.  60 tablet  11  . albuterol (  PROVENTIL HFA;VENTOLIN HFA) 108 (90 BASE) MCG/ACT inhaler Inhale 2 puffs into the lungs every 6 (six) hours as needed.  1 Inhaler  1    PHYSICAL EXAM: Filed Vitals:   04/22/11 0911  BP: 110/74  Pulse: 79  Weight: 195 lb 12 oz (88.792 kg)  SpO2: 98%    General: Well appearing. No respiratory difficulty  HEENT: normal  Neck: supple. JVD 7-8. Carotids 2+ bilat; no bruits. No lymphadenopathy or thryomegaly appreciated.  Cor: PMI nondisplaced. Regular rate & rhythm. No rubs, gallops or murmurs.  Lungs: clear  Abdomen: soft, nontender, nondistended. No hepatosplenomegaly. No bruits or masses. Good bowel sounds.  Extremities: no cyanosis, clubbing, rash, edema  Neuro: alert & oriented x 3, cranial nerves grossly intact. moves all 4 extremities w/o difficulty. Affect pleasant.     ASSESSMENT & PLAN:

## 2011-04-22 NOTE — Assessment & Plan Note (Addendum)
Volume status looks good today.  NYHA II.  Will continue to optimize HF meds, increase coreg 12.5 mg BID and increase lisinopril 5 mg BID.  Follow up in 4 weeks.  Will need further evaluation for ICD placement after HF meds are optimized.  Continue daily weights and sodium restriction.   Patient seen and examined with Darrick Grinder, NP. We discussed all aspects of the encounter. I agree with the assessment and plan as stated above. Agree with medication titration.

## 2011-04-22 NOTE — Patient Instructions (Signed)
Increase coreg 12.5 mg (2 tabs) twice daily.  Increase lisinopril 5 mg twice daily.  Follow up in 1 month.  Labs today.  Do the following things EVERYDAY: 1) Weigh yourself in the morning before breakfast. Write it down and keep it in a log. 2) Take your medicines as prescribed 3) Eat low salt foods-Limit salt (sodium) to 2059m per day.  4) Stay as active as you can everyday

## 2011-04-24 ENCOUNTER — Encounter (HOSPITAL_COMMUNITY): Payer: BC Managed Care – PPO

## 2011-05-27 ENCOUNTER — Ambulatory Visit (HOSPITAL_COMMUNITY): Payer: BC Managed Care – PPO | Attending: Internal Medicine

## 2011-06-20 ENCOUNTER — Encounter (HOSPITAL_COMMUNITY): Payer: Self-pay | Admitting: *Deleted

## 2012-01-12 ENCOUNTER — Encounter (HOSPITAL_COMMUNITY): Payer: Self-pay | Admitting: Family Medicine

## 2012-01-12 ENCOUNTER — Emergency Department (HOSPITAL_COMMUNITY): Payer: BC Managed Care – PPO

## 2012-01-12 ENCOUNTER — Emergency Department (HOSPITAL_COMMUNITY)
Admission: EM | Admit: 2012-01-12 | Discharge: 2012-01-12 | Disposition: A | Discharge status: Home / Self Care | Payer: BC Managed Care – PPO | Attending: Emergency Medicine | Admitting: Emergency Medicine

## 2012-01-12 DIAGNOSIS — Z87891 Personal history of nicotine dependence: Secondary | ICD-10-CM | POA: Insufficient documentation

## 2012-01-12 DIAGNOSIS — G4733 Obstructive sleep apnea (adult) (pediatric): Secondary | ICD-10-CM | POA: Insufficient documentation

## 2012-01-12 DIAGNOSIS — I10 Essential (primary) hypertension: Secondary | ICD-10-CM | POA: Insufficient documentation

## 2012-01-12 DIAGNOSIS — Z7982 Long term (current) use of aspirin: Secondary | ICD-10-CM | POA: Insufficient documentation

## 2012-01-12 DIAGNOSIS — I428 Other cardiomyopathies: Secondary | ICD-10-CM | POA: Insufficient documentation

## 2012-01-12 DIAGNOSIS — J40 Bronchitis, not specified as acute or chronic: Secondary | ICD-10-CM

## 2012-01-12 DIAGNOSIS — I509 Heart failure, unspecified: Secondary | ICD-10-CM | POA: Insufficient documentation

## 2012-01-12 DIAGNOSIS — E119 Type 2 diabetes mellitus without complications: Secondary | ICD-10-CM | POA: Insufficient documentation

## 2012-01-12 DIAGNOSIS — Q210 Ventricular septal defect: Secondary | ICD-10-CM | POA: Insufficient documentation

## 2012-01-12 MED ORDER — AEROCHAMBER Z-STAT PLUS/MEDIUM MISC
1.0000 | Freq: Once | Status: AC
  Administered 2012-01-12: 1
  Filled 2012-01-12: qty 1

## 2012-01-12 MED ORDER — DOXYCYCLINE HYCLATE 100 MG PO CAPS: 100.0000 mg | ORAL_CAPSULE | Freq: Two times a day (BID) | ORAL | Status: DC

## 2012-01-12 MED ORDER — ALBUTEROL SULFATE HFA 108 (90 BASE) MCG/ACT IN AERS
2.0000 | INHALATION_SPRAY | RESPIRATORY_TRACT | Status: DC | PRN
  Administered 2012-01-12: 2 via RESPIRATORY_TRACT
  Filled 2012-01-12: qty 6.7

## 2012-01-12 MED ORDER — ALBUTEROL SULFATE (2.5 MG/3ML) 0.083% IN NEBU: 2.5000 mg | INHALATION_SOLUTION | RESPIRATORY_TRACT | Status: DC | PRN

## 2012-01-12 NOTE — ED Notes (Signed)
Pt sts SOB and prodcutive cough for the past few days.

## 2012-01-12 NOTE — ED Provider Notes (Signed)
History  This chart was scribed for Kevin Kick, MD by Jenne Campus. This patient was seen in room TR02C/TR02C and the patient's care was started at 7:10PM.  CSN: [id number]  Arrival date & time 01/12/12  1653   First MD Initiated Contact with Patient 01/12/12 1910    Chief Complaint  Patient presents with  . Shortness of Breath  . Cough     The history is provided by the patient. No language interpreter was used.    STALIN GRUENBERG is a 36 y.o. male who presents to the Emergency Department complaining of 2 to 3 days of gradual onset, gradually worsening, constant cough productive of yellow tinged with brown mucus with associated occasional SOB. He states that he used his wife's nebulizer with improvement in his symptoms. He denies fever, nausea, emesis, leg swelling and rash as associated symptoms. He has a h/o HTN, DM and CHF. He reports that he weights 190 currently but is trying to get down to 175 (recommended weight by cardiologist).   He sees [facility] for his CHF and cardiomyopathy  Past Medical History  Diagnosis Date  . Diabetes mellitus   . Hypertension   . VSD (ventricular septal defect)   . Non-ischemic cardiomyopathy   . Obstructive sleep apnea   . CHF (congestive heart failure)     Systolic, EF 47% per echo 11/12    Past Surgical History  Procedure Date  . Vsd repair     Family History  Problem Relation Age of Onset  . Lung cancer Father   . Stroke Father   . Diabetes Maternal Grandfather   . Diabetes Paternal Grandmother   . Stroke Mother     History  Substance Use Topics  . Smoking status: Former Smoker -- 0.3 packs/day for 15 years    Types: Cigarettes    Quit date: 04/08/2011  . Smokeless tobacco: Never Used   Comment: Smokes depends on who is around him.    . Alcohol Use: 0.0 oz/week     Rarely       Review of Systems  A complete 10 system review of systems was obtained and all systems are negative except as  noted in the HPI and PMH.    Allergies  Review of patient's allergies indicates no known allergies.  Home Medications   Current Outpatient Rx  Name Route Sig Dispense Refill  . ASPIRIN 81 MG PO TABS Oral Take 81 mg by mouth daily.      Marland Kitchen CARVEDILOL 6.25 MG PO TABS Oral Take 2 tablets (12.5 mg total) by mouth 2 (two) times daily with a meal. 90 tablet 11  . FUROSEMIDE 20 MG PO TABS Oral Take 1 tablet (20 mg total) by mouth 2 (two) times daily. 60 tablet 11  . LISINOPRIL 5 MG PO TABS Oral Take 1 tablet (5 mg total) by mouth 2 (two) times daily. 60 tablet 6  . METFORMIN HCL 500 MG PO TABS Oral Take 1 tablet (500 mg total) by mouth daily. 30 tablet 11  . SIMVASTATIN 20 MG PO TABS Oral Take 1 tablet (20 mg total) by mouth at bedtime. 30 tablet 11  . SPIRONOLACTONE 25 MG PO TABS Oral Take 1 tablet (25 mg total) by mouth daily. 30 tablet 11  . ALBUTEROL SULFATE (2.5 MG/3ML) 0.083% IN NEBU Nebulization Take 3 mLs (2.5 mg total) by nebulization every 4 (four) hours as needed for wheezing. 30 vial 0  . DOXYCYCLINE HYCLATE 100 MG PO CAPS  Oral Take 1 capsule (100 mg total) by mouth 2 (two) times daily. 20 capsule 0    Triage Vitals: BP 126/89  Pulse 84  Temp 98.6 F (37 C)  Resp 18  SpO2 98%  Physical Exam  Nursing note and vitals reviewed. Constitutional: He is oriented to person, place, and time. He appears well-developed and well-nourished. No distress.  HENT:  Head: Normocephalic and atraumatic.  Eyes: Conjunctivae normal and EOM are normal.  Neck: Neck supple. No tracheal deviation present.  Cardiovascular: Normal rate and regular rhythm.   Pulmonary/Chest: Effort normal. No respiratory distress. He has no wheezes. He has no rales.       Decreased air movement bilaterally with scattered wheezes, no rhonchi   Abdominal: He exhibits no distension.  Musculoskeletal: Normal range of motion. He exhibits no edema.  Neurological: He is alert and oriented to person, place, and time. No  sensory deficit.  Skin: Skin is dry.  Psychiatric: He has a normal mood and affect. His behavior is normal.    ED Course  Procedures (including critical care time)  DIAGNOSTIC STUDIES: Oxygen Saturation is 98% on room air, normal by my interpretation.    COORDINATION OF CARE: 7:21PM-Discussed discharge plan which includes an albuterol inhaler, prescription for albuterol nebulizer and antibiotic with pt at bedside and pt agreed to plan.  Labs Reviewed - No data to display No results found.   1. Bronchitis       MDM  Evaluation consistent with bronchitis. Doubt pneumonia, metabolic instability, or serious bacterial infection  Plan: Home Medications- albuterol inhaler, albuterol nebulizer and doxycycline; Home Treatments- take prescriptions as directed; Recommended follow up- with PCP or return to ED if symptoms worsen  I personally performed the services described in this documentation, which was scribed in my presence. The recorded information has been reviewed and considered.      Richarda Blade, MD 01/16/12 [phone]

## 2012-03-18 ENCOUNTER — Other Ambulatory Visit: Payer: Self-pay | Admitting: Internal Medicine

## 2012-04-01 ENCOUNTER — Ambulatory Visit (INDEPENDENT_AMBULATORY_CARE_PROVIDER_SITE_OTHER): Payer: BC Managed Care – PPO | Admitting: Internal Medicine

## 2012-04-01 ENCOUNTER — Encounter: Payer: Self-pay | Admitting: Internal Medicine

## 2012-04-01 ENCOUNTER — Other Ambulatory Visit (INDEPENDENT_AMBULATORY_CARE_PROVIDER_SITE_OTHER): Payer: BC Managed Care – PPO

## 2012-04-01 VITALS — BP 110/68 | HR 88 | Temp 97.0°F | Resp 16 | Ht 66.0 in | Wt 188.0 lb

## 2012-04-01 DIAGNOSIS — I10 Essential (primary) hypertension: Secondary | ICD-10-CM

## 2012-04-01 DIAGNOSIS — I428 Other cardiomyopathies: Secondary | ICD-10-CM

## 2012-04-01 DIAGNOSIS — Q210 Ventricular septal defect: Secondary | ICD-10-CM

## 2012-04-01 DIAGNOSIS — I5022 Chronic systolic (congestive) heart failure: Secondary | ICD-10-CM

## 2012-04-01 DIAGNOSIS — IMO0001 Reserved for inherently not codable concepts without codable children: Secondary | ICD-10-CM

## 2012-04-01 DIAGNOSIS — I509 Heart failure, unspecified: Secondary | ICD-10-CM

## 2012-04-01 DIAGNOSIS — Z23 Encounter for immunization: Secondary | ICD-10-CM

## 2012-04-01 LAB — COMPREHENSIVE METABOLIC PANEL
ALT: 17 U/L (ref 0–53)
AST: 26 U/L (ref 0–37)
CO2: 30 mEq/L (ref 19–32)
Chloride: 104 mEq/L (ref 96–112)
Creatinine, Ser: 1 mg/dL (ref 0.4–1.5)
Sodium: 141 mEq/L (ref 135–145)
Total Bilirubin: 0.6 mg/dL (ref 0.3–1.2)
Total Protein: 7.1 g/dL (ref 6.0–8.3)

## 2012-04-01 LAB — CBC WITH DIFFERENTIAL/PLATELET
Basophils Absolute: 0.1 10*3/uL (ref 0.0–0.1)
Eosinophils Absolute: 0.1 10*3/uL (ref 0.0–0.7)
Hemoglobin: 14.7 g/dL (ref 13.0–17.0)
Lymphocytes Relative: 36.1 % (ref 12.0–46.0)
Lymphs Abs: 1.9 10*3/uL (ref 0.7–4.0)
MCHC: 34.4 g/dL (ref 30.0–36.0)
Monocytes Relative: 10 % (ref 3.0–12.0)
Neutro Abs: 2.8 10*3/uL (ref 1.4–7.7)
Platelets: 167 10*3/uL (ref 150.0–400.0)
RDW: 13.4 % (ref 11.5–14.6)

## 2012-04-01 LAB — LIPID PANEL
HDL: 50.3 mg/dL (ref >39.00)
LDL Cholesterol: 76 mg/dL (ref 0–99)
Total CHOL/HDL Ratio: 3
Triglycerides: 77 mg/dL (ref 0.0–149.0)
VLDL: 15.4 mg/dL (ref 0.0–40.0)

## 2012-04-01 LAB — URINALYSIS, ROUTINE W REFLEX MICROSCOPIC
Bilirubin Urine: NEGATIVE
Hgb urine dipstick: NEGATIVE
Ketones, ur: NEGATIVE
Total Protein, Urine: NEGATIVE
Urine Glucose: NEGATIVE
pH: 5.5 (ref 5.0–8.0)

## 2012-04-01 LAB — HEMOGLOBIN A1C: Hgb A1c MFr Bld: 5.8 % (ref 4.6–6.5)

## 2012-04-01 LAB — HM DIABETES FOOT EXAM

## 2012-04-01 MED ORDER — SIMVASTATIN 20 MG PO TABS: 20.0000 mg | ORAL_TABLET | Freq: Every day | ORAL | Status: DC

## 2012-04-01 MED ORDER — CARVEDILOL 6.25 MG PO TABS: 12.5000 mg | ORAL_TABLET | Freq: Two times a day (BID) | ORAL | Status: DC

## 2012-04-01 MED ORDER — SPIRONOLACTONE 25 MG PO TABS: 25.0000 mg | ORAL_TABLET | Freq: Every day | ORAL | Status: DC

## 2012-04-01 MED ORDER — METFORMIN HCL 500 MG PO TABS: 500.0000 mg | ORAL_TABLET | Freq: Every day | ORAL | Status: DC

## 2012-04-01 MED ORDER — ROSUVASTATIN CALCIUM 10 MG PO TABS: 10.0000 mg | ORAL_TABLET | Freq: Every day | ORAL | Status: DC

## 2012-04-01 MED ORDER — FUROSEMIDE 20 MG PO TABS: 20.0000 mg | ORAL_TABLET | Freq: Two times a day (BID) | ORAL | Status: DC

## 2012-04-01 MED ORDER — LISINOPRIL 5 MG PO TABS: 5.0000 mg | ORAL_TABLET | Freq: Two times a day (BID) | ORAL | Status: DC

## 2012-04-01 NOTE — Patient Instructions (Signed)

## 2012-04-01 NOTE — Progress Notes (Signed)
  Subjective:    Patient ID: Kevin Cardenas, male    DOB: 1975-05-19, 36 y.o.   MRN: [id number]  Hypertension This is a chronic problem. The current episode started more than 1 year ago. The problem is unchanged. The problem is controlled. Pertinent negatives include no anxiety, blurred vision, chest pain, headaches, malaise/fatigue, neck pain, orthopnea, palpitations, peripheral edema, PND, shortness of breath or sweats. Past treatments include diuretics, beta blockers and ACE inhibitors. The current treatment provides significant improvement. Compliance problems include exercise and diet.  Hypertensive end-organ damage includes heart failure.      Review of Systems  Constitutional: Negative.  Negative for malaise/fatigue.  HENT: Negative.  Negative for neck pain.   Eyes: Negative.  Negative for blurred vision.  Respiratory: Negative for apnea, cough, choking, chest tightness, shortness of breath, wheezing and stridor.   Cardiovascular: Negative for chest pain, palpitations, orthopnea, leg swelling and PND.  Gastrointestinal: Negative for nausea, vomiting, abdominal pain, diarrhea, constipation and blood in stool.  Genitourinary: Negative.   Musculoskeletal: Negative for myalgias, back pain, joint swelling, arthralgias and gait problem.  Neurological: Negative for dizziness, weakness, light-headedness, numbness and headaches.  Hematological: Negative for adenopathy. Does not bruise/bleed easily.  Psychiatric/Behavioral: Negative.        Objective:   Physical Exam  Vitals reviewed. Constitutional: He is oriented to person, place, and time. He appears well-developed and well-nourished. No distress.  HENT:  Head: Normocephalic and atraumatic.  Mouth/Throat: Oropharynx is clear and moist. No oropharyngeal exudate.  Eyes: Conjunctivae normal are normal. Right eye exhibits no discharge. Left eye exhibits no discharge. No scleral icterus.  Neck: Normal range of motion. Neck supple. No JVD  present. No tracheal deviation present. No thyromegaly present.  Cardiovascular: Normal rate, regular rhythm and intact distal pulses.  Exam reveals no gallop and no friction rub.   Murmur heard.  Decrescendo systolic murmur is present with a grade of 3/6   No diastolic murmur is present  Pulmonary/Chest: Effort normal and breath sounds normal. No stridor. No respiratory distress. He has no wheezes. He has no rales. He exhibits no tenderness.  Abdominal: Soft. Bowel sounds are normal. He exhibits no distension and no mass. There is no tenderness. There is no rebound and no guarding.  Musculoskeletal: Normal range of motion. He exhibits no edema and no tenderness.  Lymphadenopathy:    He has no cervical adenopathy.  Neurological: He is oriented to person, place, and time.  Skin: Skin is warm and dry. No rash noted. He is not diaphoretic. No erythema. No pallor.  Psychiatric: He has a normal mood and affect. His behavior is normal. Judgment and thought content normal.     Lab Results  Component Value Date   WBC 3.9* 01/30/2011   HGB 14.4 01/30/2011   HCT 42.6 01/30/2011   PLT 159.0 01/30/2011   GLUCOSE 95 04/22/2011   CHOL 135 01/30/2011   TRIG 145.0 01/30/2011   HDL 47.40 01/30/2011   LDLCALC 59 01/30/2011   ALT 19 01/30/2011   AST 19 01/30/2011   NA 140 04/22/2011   K 4.0 04/22/2011   CL 106 04/22/2011   CREATININE 0.85 04/22/2011   BUN 10 04/22/2011   CO2 26 04/22/2011   TSH 1.02 01/30/2011   INR 1.1 07/22/2007   HGBA1C 5.7 01/30/2011       Assessment & Plan:

## 2012-04-02 ENCOUNTER — Encounter: Payer: Self-pay | Admitting: Internal Medicine

## 2012-04-02 NOTE — Assessment & Plan Note (Addendum)
His A1C is low so I have asked him to stop taking metformin He has normal renal function He needs to have his annual eye exam done

## 2012-04-02 NOTE — Assessment & Plan Note (Signed)
His BP is well controlled I will check his lytes and renal function

## 2012-04-02 NOTE — Assessment & Plan Note (Signed)
He looks great today but I think he needs to have his annual cardiology f/up soon

## 2012-04-02 NOTE — Assessment & Plan Note (Signed)
He needs a cardiology f/up

## 2012-04-11 ENCOUNTER — Encounter (HOSPITAL_COMMUNITY): Payer: Self-pay | Admitting: *Deleted

## 2012-04-11 ENCOUNTER — Emergency Department (HOSPITAL_COMMUNITY)
Admission: EM | Admit: 2012-04-11 | Discharge: 2012-04-11 | Disposition: A | Discharge status: Home / Self Care | Payer: BC Managed Care – PPO | Attending: Emergency Medicine | Admitting: Emergency Medicine

## 2012-04-11 DIAGNOSIS — E119 Type 2 diabetes mellitus without complications: Secondary | ICD-10-CM | POA: Insufficient documentation

## 2012-04-11 DIAGNOSIS — Z7982 Long term (current) use of aspirin: Secondary | ICD-10-CM | POA: Insufficient documentation

## 2012-04-11 DIAGNOSIS — Z8669 Personal history of other diseases of the nervous system and sense organs: Secondary | ICD-10-CM | POA: Insufficient documentation

## 2012-04-11 DIAGNOSIS — R0989 Other specified symptoms and signs involving the circulatory and respiratory systems: Secondary | ICD-10-CM | POA: Insufficient documentation

## 2012-04-11 DIAGNOSIS — R42 Dizziness and giddiness: Secondary | ICD-10-CM | POA: Insufficient documentation

## 2012-04-11 DIAGNOSIS — Z79899 Other long term (current) drug therapy: Secondary | ICD-10-CM | POA: Insufficient documentation

## 2012-04-11 DIAGNOSIS — F411 Generalized anxiety disorder: Secondary | ICD-10-CM | POA: Insufficient documentation

## 2012-04-11 DIAGNOSIS — I428 Other cardiomyopathies: Secondary | ICD-10-CM | POA: Insufficient documentation

## 2012-04-11 DIAGNOSIS — R0609 Other forms of dyspnea: Secondary | ICD-10-CM | POA: Insufficient documentation

## 2012-04-11 DIAGNOSIS — Z87891 Personal history of nicotine dependence: Secondary | ICD-10-CM | POA: Insufficient documentation

## 2012-04-11 DIAGNOSIS — F419 Anxiety disorder, unspecified: Secondary | ICD-10-CM

## 2012-04-11 DIAGNOSIS — R450 Nervousness: Secondary | ICD-10-CM | POA: Insufficient documentation

## 2012-04-11 DIAGNOSIS — Z8774 Personal history of (corrected) congenital malformations of heart and circulatory system: Secondary | ICD-10-CM | POA: Insufficient documentation

## 2012-04-11 DIAGNOSIS — I10 Essential (primary) hypertension: Secondary | ICD-10-CM | POA: Insufficient documentation

## 2012-04-11 DIAGNOSIS — I509 Heart failure, unspecified: Secondary | ICD-10-CM | POA: Insufficient documentation

## 2012-04-11 DIAGNOSIS — R0600 Dyspnea, unspecified: Secondary | ICD-10-CM

## 2012-04-11 MED ORDER — SIMVASTATIN 20 MG PO TABS
20.0000 mg | ORAL_TABLET | Freq: Every day | ORAL | Status: DC
Start: 2012-04-11 — End: 2014-04-17

## 2012-04-11 MED ORDER — METFORMIN HCL 500 MG PO TABS: 500.0000 mg | ORAL_TABLET | Freq: Every day | ORAL | Status: DC

## 2012-04-11 MED ORDER — CARVEDILOL 6.25 MG PO TABS: 12.5000 mg | ORAL_TABLET | Freq: Two times a day (BID) | ORAL | Status: DC

## 2012-04-11 MED ORDER — FUROSEMIDE 20 MG PO TABS: 20.0000 mg | ORAL_TABLET | Freq: Two times a day (BID) | ORAL | Status: DC

## 2012-04-11 MED ORDER — ASPIRIN 81 MG PO CHEW: 81.0000 mg | CHEWABLE_TABLET | Freq: Every day | ORAL | Status: DC

## 2012-04-11 MED ORDER — LISINOPRIL 5 MG PO TABS: 5.0000 mg | ORAL_TABLET | Freq: Two times a day (BID) | ORAL | Status: DC

## 2012-04-11 MED ORDER — SPIRONOLACTONE 25 MG PO TABS: 25.0000 mg | ORAL_TABLET | Freq: Every day | ORAL | Status: DC

## 2012-04-11 NOTE — ED Notes (Addendum)
C/o sob & dizziness. H/o DM. Presents anxious, "trying to calm self down", "upset about meds being stolen today from home, all my meds gone, need my meds". Alert, NAD, anxious, interactive skin W&D, resps e/u, speaking in clear complete sentences. "Last dose of meds was this am, but takes lasix & another med BID". Here with family. Here both for acute sx & needs meds. "he just got them filled", easily working himself up, more anxious the more he answers questions. LS CTA, no pedal edema noted. VS WNL. Low grade fever.

## 2012-04-11 NOTE — ED Notes (Signed)
"  feel better after I was able to sit, relax and calm down", ambulatory to room 31 with steady gait, NAD, calm.

## 2012-04-11 NOTE — ED Provider Notes (Signed)
History     CSN: [id number]  Arrival date & time 04/11/12  0016   First MD Initiated Contact with Patient 04/11/12 0401      Chief Complaint  Patient presents with  . Shortness of Breath  . Dizziness     Patient is a 36 y.o. male presenting with anxiety. The history is provided by the patient.  Anxiety This is a new problem. The problem occurs constantly. The problem has been gradually improving. Associated symptoms include shortness of breath. Pertinent negatives include no chest pain. Nothing aggravates the symptoms. Nothing relieves the symptoms. Treatments tried: rest. The treatment provided mild relief.  pt reports after work, he had "to have police escort" to get home "was unable to get my meds" Due to this, he became anxious, short of breath and dizzy.  No cp.  No syncope.  No abd pain.  No vomiting.  He is now feeling improved.  While at work he felt fine and has otherwise been at his baseline.  He reports he can not go back to get his meds and is unable to take them  Pt reports he has safe place to go.  He is with an escort who confirms story.  He does not want to share details why he needed police escort to go home from work  Past Medical History  Diagnosis Date  . Diabetes mellitus   . Hypertension   . VSD (ventricular septal defect)   . Non-ischemic cardiomyopathy   . Obstructive sleep apnea   . CHF (congestive heart failure)     Systolic, EF 18% per echo 11/12    Past Surgical History  Procedure Date  . Vsd repair     Family History  Problem Relation Age of Onset  . Lung cancer Father   . Stroke Father   . Diabetes Maternal Grandfather   . Diabetes Paternal Grandmother   . Stroke Mother   . Cancer Neg Hx   . Alcohol abuse Neg Hx   . Drug abuse Neg Hx   . Early death Neg Hx   . Heart disease Neg Hx   . Hyperlipidemia Neg Hx   . Hypertension Neg Hx   . Kidney disease Neg Hx     History  Substance Use Topics  . Smoking status: Former Smoker -- 0.3  packs/day for 15 years    Types: Cigarettes    Quit date: 04/08/2011  . Smokeless tobacco: Never Used     Comment: Smokes depends on who is around him.    . Alcohol Use: No     Comment: Rarely       Review of Systems  Constitutional: Negative for fever.  Respiratory: Positive for shortness of breath.   Cardiovascular: Negative for chest pain.  Neurological: Negative for weakness.  Psychiatric/Behavioral: The patient is nervous/anxious.     Allergies  Review of patient's allergies indicates no known allergies.  Home Medications   Current Outpatient Rx  Name  Route  Sig  Dispense  Refill  . ASPIRIN 81 MG PO TABS   Oral   Take 81 mg by mouth daily.           Marland Kitchen CARVEDILOL 6.25 MG PO TABS   Oral   Take 2 tablets (12.5 mg total) by mouth 2 (two) times daily with a meal.   180 tablet   1   . FUROSEMIDE 20 MG PO TABS   Oral   Take 1 tablet (20 mg total) by mouth 2 (  two) times daily.   180 tablet   1   . LISINOPRIL 5 MG PO TABS   Oral   Take 1 tablet (5 mg total) by mouth 2 (two) times daily.   180 tablet   1   . METFORMIN HCL 500 MG PO TABS   Oral   Take 500 mg by mouth daily with breakfast.         . SIMVASTATIN 20 MG PO TABS   Oral   Take 1 tablet (20 mg total) by mouth at bedtime.   90 tablet   3   . SPIRONOLACTONE 25 MG PO TABS   Oral   Take 1 tablet (25 mg total) by mouth daily.   90 tablet   1     BP 125/73  Temp 99.1 F (37.3 C) (Oral)  Resp 18  SpO2 97%  Physical Exam CONSTITUTIONAL: Well developed/well nourished HEAD AND FACE: Normocephalic/atraumatic EYES: EOMI/PERRL ENMT: Mucous membranes moist NECK: supple no meningeal signs CV: S1/S2 noted LUNGS: Lungs are clear to auscultation bilaterally, no apparent distress ABDOMEN: soft, nontender, no rebound or guarding GU:no cva tenderness NEURO: Pt is awake/alert, moves all extremitiesx4 EXTREMITIES: pulses normal, full ROM SKIN: warm, color normal PSYCH: mildly anxious in  appearance  ED Course  Procedures   Pt reports all of his symptoms started after having stressful situation.  He now feels improved.  No active CP.  EKG reviewed.  He requests refills on all his meds and this was given.  He reports he feels safe for d/c home  MDM  Nursing notes including past medical history and social history reviewed and considered in documentation        Date: 04/11/2012  Rate: 69  Rhythm: normal sinus rhythm  QRS Axis: left  Intervals: normal  ST/T Wave abnormalities: nonspecific ST changes  Conduction Disutrbances:nonspecific intraventricular conduction delay  Narrative Interpretation:   Old EKG Reviewed: unchanged and from EKG from 2012    Sharyon Cable, MD 04/11/12 [phone]

## 2012-04-28 LAB — HM DIABETES EYE EXAM: HM Diabetic Eye Exam: NORMAL

## 2012-06-30 ENCOUNTER — Ambulatory Visit: Payer: BC Managed Care – PPO | Admitting: Internal Medicine

## 2012-07-02 ENCOUNTER — Encounter: Payer: Self-pay | Admitting: Internal Medicine

## 2012-07-02 ENCOUNTER — Other Ambulatory Visit (INDEPENDENT_AMBULATORY_CARE_PROVIDER_SITE_OTHER): Payer: BC Managed Care – PPO

## 2012-07-02 ENCOUNTER — Ambulatory Visit (INDEPENDENT_AMBULATORY_CARE_PROVIDER_SITE_OTHER): Payer: BC Managed Care – PPO | Admitting: Internal Medicine

## 2012-07-02 VITALS — BP 106/82 | HR 79 | Temp 97.5°F | Resp 16 | Wt 188.0 lb

## 2012-07-02 DIAGNOSIS — I10 Essential (primary) hypertension: Secondary | ICD-10-CM

## 2012-07-02 DIAGNOSIS — I5022 Chronic systolic (congestive) heart failure: Secondary | ICD-10-CM

## 2012-07-02 DIAGNOSIS — I509 Heart failure, unspecified: Secondary | ICD-10-CM

## 2012-07-02 LAB — BASIC METABOLIC PANEL
BUN: 13 mg/dL (ref 6–23)
CO2: 25 mEq/L (ref 19–32)
Chloride: 103 mEq/L (ref 96–112)
Potassium: 3.8 mEq/L (ref 3.5–5.1)

## 2012-07-02 NOTE — Patient Instructions (Signed)

## 2012-07-02 NOTE — Progress Notes (Signed)
Subjective:    Patient ID: Kevin Cardenas, male    DOB: [birth year], 37 y.o.   MRN: [id number]  Hypertension This is a chronic problem. The current episode started more than 1 year ago. The problem has been gradually improving since onset. The problem is controlled. Pertinent negatives include no anxiety, blurred vision, chest pain, headaches, malaise/fatigue, neck pain, orthopnea, palpitations, peripheral edema, PND, shortness of breath or sweats. Past treatments include beta blockers, diuretics and ACE inhibitors. The current treatment provides significant improvement. There are no compliance problems.  Hypertensive end-organ damage includes heart failure.      Review of Systems  Constitutional: Negative.  Negative for malaise/fatigue.  HENT: Negative.  Negative for neck pain.   Eyes: Negative.  Negative for blurred vision.  Respiratory: Negative.  Negative for apnea, cough, choking, chest tightness, shortness of breath, wheezing and stridor.   Cardiovascular: Negative.  Negative for chest pain, palpitations, orthopnea, leg swelling and PND.  Gastrointestinal: Negative.  Negative for nausea, vomiting, abdominal pain and diarrhea.  Endocrine: Negative.   Genitourinary: Negative.   Musculoskeletal: Negative.  Negative for myalgias and arthralgias.  Skin: Negative.   Allergic/Immunologic: Negative.   Neurological: Negative.  Negative for dizziness, syncope, weakness, light-headedness and headaches.  Hematological: Negative.  Negative for adenopathy. Does not bruise/bleed easily.  Psychiatric/Behavioral: Negative.        Objective:   Physical Exam  Vitals reviewed. Constitutional: He is oriented to person, place, and time. He appears well-developed and well-nourished. No distress.  HENT:  Head: Normocephalic and atraumatic.  Mouth/Throat: Oropharynx is clear and moist. No oropharyngeal exudate.  Eyes: Conjunctivae are normal. Right eye exhibits no discharge. Left eye exhibits no  discharge. No scleral icterus.  Neck: Normal range of motion. Neck supple. No JVD present. No tracheal deviation present. No thyromegaly present.  Cardiovascular: Normal rate, regular rhythm, S1 normal, S2 normal and intact distal pulses.  Exam reveals no gallop, no S3, no S4, no distant heart sounds and no friction rub.   Murmur heard.  Decrescendo systolic murmur is present with a grade of 3/6   No diastolic murmur is present  Pulses:      Carotid pulses are 1+ on the right side, and 1+ on the left side.      Radial pulses are 1+ on the right side, and 1+ on the left side.       Femoral pulses are 1+ on the right side, and 1+ on the left side.      Popliteal pulses are 1+ on the right side, and 1+ on the left side.       Dorsalis pedis pulses are 1+ on the right side, and 1+ on the left side.       Posterior tibial pulses are 1+ on the right side, and 1+ on the left side.  Pulmonary/Chest: Effort normal and breath sounds normal. No stridor. No respiratory distress. He has no wheezes. He has no rales. He exhibits no tenderness.  Abdominal: Soft. Bowel sounds are normal. He exhibits no distension and no mass. There is no tenderness. There is no rebound and no guarding.  Musculoskeletal: Normal range of motion. He exhibits no edema and no tenderness.  Lymphadenopathy:    He has no cervical adenopathy.  Neurological: He is oriented to person, place, and time.  Skin: Skin is warm and dry. No rash noted. He is not diaphoretic. No erythema. No pallor.  Psychiatric: He has a normal mood and affect. His behavior is normal. Judgment  and thought content normal.     Lab Results  Component Value Date   WBC 5.4 04/01/2012   HGB 14.7 04/01/2012   HCT 42.7 04/01/2012   PLT 167.0 04/01/2012   GLUCOSE 98 04/01/2012   CHOL 142 04/01/2012   TRIG 77.0 04/01/2012   HDL 50.30 04/01/2012   LDLCALC 76 04/01/2012   ALT 17 04/01/2012   AST 26 04/01/2012   NA 141 04/01/2012   K 3.9 04/01/2012   CL 104  04/01/2012   CREATININE 1.0 04/01/2012   BUN 10 04/01/2012   CO2 30 04/01/2012   TSH 2.07 04/01/2012   INR 1.1 07/22/2007   HGBA1C 5.8 04/01/2012       Assessment & Plan:

## 2012-07-04 ENCOUNTER — Encounter: Payer: Self-pay | Admitting: Internal Medicine

## 2012-07-04 NOTE — Assessment & Plan Note (Signed)
His volume status is normal today

## 2012-07-04 NOTE — Assessment & Plan Note (Signed)
His a1c has been low so I asked him to stop metformin I will check his random blood sugar and his renal function today

## 2012-07-04 NOTE — Assessment & Plan Note (Signed)
His BP is well controlled Today I will check his lytes and renal function 

## 2012-10-04 ENCOUNTER — Ambulatory Visit: Payer: BC Managed Care – PPO | Admitting: Internal Medicine

## 2012-11-01 ENCOUNTER — Ambulatory Visit: Payer: BC Managed Care – PPO | Admitting: Internal Medicine

## 2012-11-01 DIAGNOSIS — Z0289 Encounter for other administrative examinations: Secondary | ICD-10-CM

## 2014-02-18 ENCOUNTER — Other Ambulatory Visit: Payer: Self-pay | Admitting: Internal Medicine

## 2014-04-17 ENCOUNTER — Ambulatory Visit (INDEPENDENT_AMBULATORY_CARE_PROVIDER_SITE_OTHER): Payer: Self-pay | Admitting: *Deleted

## 2014-04-17 ENCOUNTER — Ambulatory Visit (INDEPENDENT_AMBULATORY_CARE_PROVIDER_SITE_OTHER): Payer: Self-pay | Admitting: Internal Medicine

## 2014-04-17 ENCOUNTER — Encounter: Payer: Self-pay | Admitting: Internal Medicine

## 2014-04-17 ENCOUNTER — Other Ambulatory Visit (INDEPENDENT_AMBULATORY_CARE_PROVIDER_SITE_OTHER): Payer: Self-pay

## 2014-04-17 VITALS — BP 114/72 | HR 61 | Temp 98.0°F | Resp 16 | Ht 66.0 in | Wt 202.0 lb

## 2014-04-17 DIAGNOSIS — Z23 Encounter for immunization: Secondary | ICD-10-CM

## 2014-04-17 DIAGNOSIS — I5022 Chronic systolic (congestive) heart failure: Secondary | ICD-10-CM

## 2014-04-17 DIAGNOSIS — E118 Type 2 diabetes mellitus with unspecified complications: Secondary | ICD-10-CM

## 2014-04-17 DIAGNOSIS — E785 Hyperlipidemia, unspecified: Secondary | ICD-10-CM

## 2014-04-17 DIAGNOSIS — I10 Essential (primary) hypertension: Secondary | ICD-10-CM

## 2014-04-17 DIAGNOSIS — I429 Cardiomyopathy, unspecified: Secondary | ICD-10-CM

## 2014-04-17 DIAGNOSIS — I428 Other cardiomyopathies: Secondary | ICD-10-CM

## 2014-04-17 DIAGNOSIS — Z0000 Encounter for general adult medical examination without abnormal findings: Secondary | ICD-10-CM

## 2014-04-17 LAB — MICROALBUMIN / CREATININE URINE RATIO
Creatinine,U: 153.1 mg/dL
MICROALB/CREAT RATIO: 0.3 mg/g (ref 0.0–30.0)
Microalb, Ur: 0.5 mg/dL (ref 0.0–1.9)

## 2014-04-17 LAB — COMPREHENSIVE METABOLIC PANEL
ALT: 46 U/L (ref 0–53)
AST: 27 U/L (ref 0–37)
Albumin: 4 g/dL (ref 3.5–5.2)
Alkaline Phosphatase: 62 U/L (ref 39–117)
BILIRUBIN TOTAL: 0.6 mg/dL (ref 0.2–1.2)
BUN: 16 mg/dL (ref 6–23)
CHLORIDE: 104 meq/L (ref 96–112)
CO2: 28 meq/L (ref 19–32)
CREATININE: 0.9 mg/dL (ref 0.4–1.5)
Calcium: 8.9 mg/dL (ref 8.4–10.5)
GFR: 117.62 mL/min (ref >60.00)
Glucose, Bld: 86 mg/dL (ref 70–99)
Potassium: 4.1 mEq/L (ref 3.5–5.1)
SODIUM: 141 meq/L (ref 135–145)
TOTAL PROTEIN: 6.9 g/dL (ref 6.0–8.3)

## 2014-04-17 LAB — CBC WITH DIFFERENTIAL/PLATELET
BASOS PCT: 0.6 % (ref 0.0–3.0)
Basophils Absolute: 0 10*3/uL (ref 0.0–0.1)
EOS ABS: 0.1 10*3/uL (ref 0.0–0.7)
EOS PCT: 1.6 % (ref 0.0–5.0)
HCT: 46 % (ref 39.0–52.0)
HEMOGLOBIN: 15 g/dL (ref 13.0–17.0)
LYMPHS PCT: 41.1 % (ref 12.0–46.0)
Lymphs Abs: 1.9 10*3/uL (ref 0.7–4.0)
MCHC: 32.5 g/dL (ref 30.0–36.0)
MCV: 97.6 fl (ref 78.0–100.0)
MONOS PCT: 10 % (ref 3.0–12.0)
Monocytes Absolute: 0.5 10*3/uL (ref 0.1–1.0)
NEUTROS ABS: 2.2 10*3/uL (ref 1.4–7.7)
NEUTROS PCT: 46.7 % (ref 43.0–77.0)
Platelets: 150 10*3/uL (ref 150.0–400.0)
RBC: 4.72 Mil/uL (ref 4.22–5.81)
RDW: 13.7 % (ref 11.5–15.5)
WBC: 4.7 10*3/uL (ref 4.0–10.5)

## 2014-04-17 LAB — URINALYSIS, ROUTINE W REFLEX MICROSCOPIC
BILIRUBIN URINE: NEGATIVE
Hgb urine dipstick: NEGATIVE
KETONES UR: NEGATIVE
LEUKOCYTES UA: NEGATIVE
Nitrite: NEGATIVE
PH: 5.5 (ref 5.0–8.0)
TOTAL PROTEIN, URINE-UPE24: NEGATIVE
URINE GLUCOSE: NEGATIVE
Urobilinogen, UA: 0.2 (ref 0.0–1.0)

## 2014-04-17 LAB — LIPID PANEL
CHOL/HDL RATIO: 3
Cholesterol: 151 mg/dL (ref 0–200)
HDL: 46.2 mg/dL (ref >39.00)
LDL Cholesterol: 89 mg/dL (ref 0–99)
NONHDL: 104.8
Triglycerides: 80 mg/dL (ref 0.0–149.0)
VLDL: 16 mg/dL (ref 0.0–40.0)

## 2014-04-17 LAB — HEMOGLOBIN A1C: Hgb A1c MFr Bld: 5.9 % (ref 4.6–6.5)

## 2014-04-17 LAB — TSH: TSH: 1.9 u[IU]/mL (ref 0.35–4.50)

## 2014-04-17 MED ORDER — FUROSEMIDE 20 MG PO TABS: 20.0000 mg | ORAL_TABLET | Freq: Two times a day (BID) | ORAL | Status: DC

## 2014-04-17 MED ORDER — SIMVASTATIN 20 MG PO TABS: 20.0000 mg | ORAL_TABLET | Freq: Every day | ORAL | Status: DC

## 2014-04-17 MED ORDER — SPIRONOLACTONE 25 MG PO TABS: 25.0000 mg | ORAL_TABLET | Freq: Every day | ORAL | Status: DC

## 2014-04-17 MED ORDER — LISINOPRIL 5 MG PO TABS: 5.0000 mg | ORAL_TABLET | Freq: Two times a day (BID) | ORAL | Status: DC

## 2014-04-17 MED ORDER — CARVEDILOL 6.25 MG PO TABS: 12.5000 mg | ORAL_TABLET | Freq: Two times a day (BID) | ORAL | Status: DC

## 2014-04-17 NOTE — Patient Instructions (Signed)
Hypertension Hypertension, commonly called high blood pressure, is when the force of blood pumping through your arteries is too strong. Your arteries are the blood vessels that carry blood from your heart throughout your body. A blood pressure reading consists of a higher number over a lower number, such as 110/72. The higher number (systolic) is the pressure inside your arteries when your heart pumps. The lower number (diastolic) is the pressure inside your arteries when your heart relaxes. Ideally you want your blood pressure below 120/80. Hypertension forces your heart to work harder to pump blood. Your arteries may become narrow or stiff. Having hypertension puts you at risk for heart disease, stroke, and other problems.  RISK FACTORS Some risk factors for high blood pressure are controllable. Others are not.  Risk factors you cannot control include:   Race. You may be at higher risk if you are African American.  Age. Risk increases with age.  Gender. Men are at higher risk than women before age 45 years. After age 65, women are at higher risk than men. Risk factors you can control include:  Not getting enough exercise or physical activity.  Being overweight.  Getting too much fat, sugar, calories, or salt in your diet.  Drinking too much alcohol. SIGNS AND SYMPTOMS Hypertension does not usually cause signs or symptoms. Extremely high blood pressure (hypertensive crisis) may cause headache, anxiety, shortness of breath, and nosebleed. DIAGNOSIS  To check if you have hypertension, your health care provider will measure your blood pressure while you are seated, with your arm held at the level of your heart. It should be measured at least twice using the same arm. Certain conditions can cause a difference in blood pressure between your right and left arms. A blood pressure reading that is higher than normal on one occasion does not mean that you need treatment. If one blood pressure reading  is high, ask your health care provider about having it checked again. TREATMENT  Treating high blood pressure includes making lifestyle changes and possibly taking medicine. Living a healthy lifestyle can help lower high blood pressure. You may need to change some of your habits. Lifestyle changes may include:  Following the DASH diet. This diet is high in fruits, vegetables, and whole grains. It is low in salt, red meat, and added sugars.  Getting at least 2 hours of brisk physical activity every week.  Losing weight if necessary.  Not smoking.  Limiting alcoholic beverages.  Learning ways to reduce stress. If lifestyle changes are not enough to get your blood pressure under control, your health care provider may prescribe medicine. You may need to take more than one. Work closely with your health care provider to understand the risks and benefits. HOME CARE INSTRUCTIONS  Have your blood pressure rechecked as directed by your health care provider.   Take medicines only as directed by your health care provider. Follow the directions carefully. Blood pressure medicines must be taken as prescribed. The medicine does not work as well when you skip doses. Skipping doses also puts you at risk for problems.   Do not smoke.   Monitor your blood pressure at home as directed by your health care provider. SEEK MEDICAL CARE IF:   You think you are having a reaction to medicines taken.  You have recurrent headaches or feel dizzy.  You have swelling in your ankles.  You have trouble with your vision. SEEK IMMEDIATE MEDICAL CARE IF:  You develop a severe headache or confusion.    You have unusual weakness, numbness, or feel faint.  You have severe chest or abdominal pain.  You vomit repeatedly.  You have trouble breathing. MAKE SURE YOU:   Understand these instructions.  Will watch your condition.  Will get help right away if you are not doing well or get worse. Document  Released: 03/31/2005 Document Revised: 08/15/2013 Document Reviewed: 01/21/2013 [facility] Patient Information 2015 [place], [place]. This information is not intended to replace advice given to you by your health care provider. Make sure you discuss any questions you have with your health care provider. Health Maintenance A healthy lifestyle and preventative care can promote health and wellness.  Maintain regular health, dental, and eye exams.  Eat a healthy diet. Foods like vegetables, fruits, whole grains, low-fat dairy products, and lean protein foods contain the nutrients you need and are low in calories. Decrease your intake of foods high in solid fats, added sugars, and salt. Get information about a proper diet from your health care provider, if necessary.  Regular physical exercise is one of the most important things you can do for your health. Most adults should get at least 150 minutes of moderate-intensity exercise (any activity that increases your heart rate and causes you to sweat) each week. In addition, most adults need muscle-strengthening exercises on 2 or more days a week.   Maintain a healthy weight. The body mass index (BMI) is a screening tool to identify possible weight problems. It provides an estimate of body fat based on height and weight. Your health care provider can find your BMI and can help you achieve or maintain a healthy weight. For males 20 years and older:  A BMI below 18.5 is considered underweight.  A BMI of 18.5 to 24.9 is normal.  A BMI of 25 to 29.9 is considered overweight.  A BMI of 30 and above is considered obese.  Maintain normal blood lipids and cholesterol by exercising and minimizing your intake of saturated fat. Eat a balanced diet with plenty of fruits and vegetables. Blood tests for lipids and cholesterol should begin at age 56 and be repeated every 5 years. If your lipid or cholesterol levels are high, you are over age 25, or you are at high risk  for heart disease, you may need your cholesterol levels checked more frequently.Ongoing high lipid and cholesterol levels should be treated with medicines if diet and exercise are not working.  If you smoke, find out from your health care provider how to quit. If you do not use tobacco, do not start.  Lung cancer screening is recommended for adults aged 46-80 years who are at high risk for developing lung cancer because of a history of smoking. A yearly low-dose CT scan of the lungs is recommended for people who have at least a 30-pack-year history of smoking and are current smokers or have quit within the past 15 years. A pack year of smoking is smoking an average of 1 pack of cigarettes a day for 1 year (for example, a 30-pack-year history of smoking could mean smoking 1 pack a day for 30 years or 2 packs a day for 15 years). Yearly screening should continue until the smoker has stopped smoking for at least 15 years. Yearly screening should be stopped for people who develop a health problem that would prevent them from having lung cancer treatment.  If you choose to drink alcohol, do not have more than 2 drinks per day. One drink is considered to be 12 oz (  360 mL) of beer, 5 oz (150 mL) of wine, or 1.5 oz (45 mL) of liquor.  Avoid the use of street drugs. Do not share needles with anyone. Ask for help if you need support or instructions about stopping the use of drugs.  High blood pressure causes heart disease and increases the risk of stroke. Blood pressure should be checked at least every 1-2 years. Ongoing high blood pressure should be treated with medicines if weight loss and exercise are not effective.  If you are 58-3 years old, ask your health care provider if you should take aspirin to prevent heart disease.  Diabetes screening involves taking a blood sample to check your fasting blood sugar level. This should be done once every 3 years after age 28 if you are at a normal weight and without  risk factors for diabetes. Testing should be considered at a younger age or be carried out more frequently if you are overweight and have at least 1 risk factor for diabetes.  Colorectal cancer can be detected and often prevented. Most routine colorectal cancer screening begins at the age of 69 and continues through age 36. However, your health care provider may recommend screening at an earlier age if you have risk factors for colon cancer. On a yearly basis, your health care provider may provide home test kits to check for hidden blood in the stool. A small camera at the end of a tube may be used to directly examine the colon (sigmoidoscopy or colonoscopy) to detect the earliest forms of colorectal cancer. Talk to your health care provider about this at age 79 when routine screening begins. A direct exam of the colon should be repeated every 5-10 years through age 39, unless early forms of precancerous polyps or small growths are found.  People who are at an increased risk for hepatitis B should be screened for this virus. You are considered at high risk for hepatitis B if:  You were born in a country where hepatitis B occurs often. Talk with your health care provider about which countries are considered high risk.  Your parents were born in a high-risk country and you have not received a shot to protect against hepatitis B (hepatitis B vaccine).  You have HIV or AIDS.  You use needles to inject street drugs.  You live with, or have sex with, someone who has hepatitis B.  You are a man who has sex with other men (MSM).  You get hemodialysis treatment.  You take certain medicines for conditions like cancer, organ transplantation, and autoimmune conditions.  Hepatitis C blood testing is recommended for all people born from [birth year] through 1966 and any individual with known risk factors for hepatitis C.  Healthy men should no longer receive prostate-specific antigen (PSA) blood tests as part of  routine cancer screening. Talk to your health care provider about prostate cancer screening.  Testicular cancer screening is not recommended for adolescents or adult males who have no symptoms. Screening includes self-exam, a health care provider exam, and other screening tests. Consult with your health care provider about any symptoms you have or any concerns you have about testicular cancer.  Practice safe sex. Use condoms and avoid high-risk sexual practices to reduce the spread of sexually transmitted infections (STIs).  You should be screened for STIs, including gonorrhea and chlamydia if:  You are sexually active and are younger than 24 years.  You are older than 24 years, and your health care provider tells you  that you are at risk for this type of infection.  Your sexual activity has changed since you were last screened, and you are at an increased risk for chlamydia or gonorrhea. Ask your health care provider if you are at risk.  If you are at risk of being infected with HIV, it is recommended that you take a prescription medicine daily to prevent HIV infection. This is called pre-exposure prophylaxis (PrEP). You are considered at risk if:  You are a man who has sex with other men (MSM).  You are a heterosexual man who is sexually active with multiple partners.  You take drugs by injection.  You are sexually active with a partner who has HIV.  Talk with your health care provider about whether you are at high risk of being infected with HIV. If you choose to begin PrEP, you should first be tested for HIV. You should then be tested every 3 months for as long as you are taking PrEP.  Use sunscreen. Apply sunscreen liberally and repeatedly throughout the day. You should seek shade when your shadow is shorter than you. Protect yourself by wearing long sleeves, pants, a wide-brimmed hat, and sunglasses year round whenever you are outdoors.  Tell your health care provider of new moles  or changes in moles, especially if there is a change in shape or color. Also, tell your health care provider if a mole is larger than the size of a pencil eraser.  A one-time screening for abdominal aortic aneurysm (AAA) and surgical repair of large AAAs by ultrasound is recommended for men aged 65-75 years who are current or former smokers.  Stay current with your vaccines (immunizations). Document Released: 09/27/2007 Document Revised: 04/05/2013 Document Reviewed: 08/26/2010 [facility] Patient Information 2015 [place], [place]. This information is not intended to replace advice given to you by your health care provider. Make sure you discuss any questions you have with your health care provider.

## 2014-04-17 NOTE — Progress Notes (Signed)
Subjective:    Patient ID: Kevin Cardenas, male    DOB: [birth year], 39 y.o.   MRN: [id number]  Hypertension This is a chronic problem. The current episode started more than 1 year ago. The problem is unchanged. The problem is controlled. Pertinent negatives include no anxiety, blurred vision, chest pain, headaches, malaise/fatigue, neck pain, orthopnea, palpitations, peripheral edema, PND, shortness of breath or sweats. Past treatments include beta blockers, ACE inhibitors and diuretics. The current treatment provides significant improvement. Compliance problems include diet, exercise and psychosocial issues.  Hypertensive end-organ damage includes heart failure and left ventricular hypertrophy. There is no history of angina, kidney disease, CAD/MI, CVA, PVD or retinopathy.      Review of Systems  Constitutional: Negative.  Negative for fever, chills, malaise/fatigue, diaphoresis, appetite change and fatigue.  HENT: Negative.   Eyes: Negative.  Negative for blurred vision.  Respiratory: Negative.  Negative for apnea, cough, choking, chest tightness, shortness of breath, wheezing and stridor.   Cardiovascular: Negative.  Negative for chest pain, palpitations, orthopnea, leg swelling and PND.  Gastrointestinal: Negative.  Negative for nausea, abdominal pain, diarrhea, constipation and blood in stool.  Endocrine: Negative.   Genitourinary: Negative.  Negative for scrotal swelling, difficulty urinating and testicular pain.  Musculoskeletal: Negative.  Negative for back pain, arthralgias, gait problem, neck pain and neck stiffness.  Skin: Negative.  Negative for rash.  Allergic/Immunologic: Negative.   Neurological: Negative.  Negative for headaches.  Hematological: Negative.  Negative for adenopathy. Does not bruise/bleed easily.  Psychiatric/Behavioral: Negative.        Objective:   Physical Exam  Constitutional: He is oriented to person, place, and time. He appears well-developed and  well-nourished. No distress.  HENT:  Head: Normocephalic and atraumatic.  Mouth/Throat: Oropharynx is clear and moist. No oropharyngeal exudate.  Eyes: Conjunctivae are normal. Right eye exhibits no discharge. Left eye exhibits no discharge. No scleral icterus.  Neck: Normal range of motion. Neck supple. No JVD present. No tracheal deviation present. No thyromegaly present.  Cardiovascular: Normal rate, regular rhythm and intact distal pulses.  Exam reveals no gallop and no friction rub.   Murmur heard. Pulmonary/Chest: Effort normal and breath sounds normal. No stridor. No respiratory distress. He has no wheezes. He has no rales. He exhibits no tenderness.  Abdominal: Soft. Bowel sounds are normal. He exhibits no distension and no mass. There is no tenderness. There is no rebound and no guarding. Hernia confirmed negative in the right inguinal area and confirmed negative in the left inguinal area.  Genitourinary: Testes normal and penis normal. Right testis shows no mass, no swelling and no tenderness. Right testis is descended. Left testis shows no mass, no swelling and no tenderness. Left testis is descended. Circumcised. No penile erythema or penile tenderness. No discharge found.  Musculoskeletal: Normal range of motion. He exhibits no edema or tenderness.  Lymphadenopathy:    He has no cervical adenopathy.       Right: No inguinal adenopathy present.       Left: No inguinal adenopathy present.  Neurological: He is oriented to person, place, and time.  Skin: Skin is warm and dry. No rash noted. He is not diaphoretic. No erythema. No pallor.  Psychiatric: He has a normal mood and affect. His behavior is normal. Judgment and thought content normal.  Vitals reviewed.     Lab Results  Component Value Date   WBC 5.4 04/01/2012   HGB 14.7 04/01/2012   HCT 42.7 04/01/2012   PLT 167.0  04/01/2012   GLUCOSE 91 07/02/2012   CHOL 142 04/01/2012   TRIG 77.0 04/01/2012   HDL 50.30 04/01/2012     LDLCALC 76 04/01/2012   ALT 17 04/01/2012   AST 26 04/01/2012   NA 137 07/02/2012   K 3.8 07/02/2012   CL 103 07/02/2012   CREATININE 0.9 07/02/2012   BUN 13 07/02/2012   CO2 25 07/02/2012   TSH 2.07 04/01/2012   INR 1.1 07/22/2007   HGBA1C 5.8 04/01/2012      Assessment & Plan:

## 2014-04-18 DIAGNOSIS — Z0000 Encounter for general adult medical examination without abnormal findings: Secondary | ICD-10-CM | POA: Insufficient documentation

## 2014-04-18 NOTE — Assessment & Plan Note (Signed)
He has achieved his LDL goal and is doing well on zocor

## 2014-04-18 NOTE — Assessment & Plan Note (Signed)
His blood sugars are well controlled Ne meds needed at this time

## 2014-04-18 NOTE — Assessment & Plan Note (Signed)
He has not seen cardiology in a few years and may be due for a f/up ECHO, refer to cardiology

## 2014-04-18 NOTE — Assessment & Plan Note (Signed)
His BP is well controlled Will cont the current meds Will monitor his lytes and renal function

## 2014-04-18 NOTE — Assessment & Plan Note (Signed)
Vaccines were reviewed and updated Exam done Labs ordered Pt ed material was given

## 2014-05-09 ENCOUNTER — Encounter (HOSPITAL_COMMUNITY): Payer: Self-pay

## 2014-05-09 ENCOUNTER — Ambulatory Visit (HOSPITAL_COMMUNITY)
Admission: RE | Admit: 2014-05-09 | Discharge: 2014-05-09 | Disposition: A | Discharge status: Home / Self Care | Payer: Self-pay | Source: Ambulatory Visit | Attending: Cardiology | Admitting: Cardiology

## 2014-05-09 DIAGNOSIS — Q210 Ventricular septal defect: Secondary | ICD-10-CM | POA: Insufficient documentation

## 2014-05-09 DIAGNOSIS — I5022 Chronic systolic (congestive) heart failure: Secondary | ICD-10-CM | POA: Insufficient documentation

## 2014-05-09 DIAGNOSIS — Q256 Stenosis of pulmonary artery: Secondary | ICD-10-CM | POA: Insufficient documentation

## 2014-05-09 MED ORDER — SACUBITRIL-VALSARTAN 24-26 MG PO TABS: 1.0000 | ORAL_TABLET | Freq: Two times a day (BID) | ORAL | Status: DC

## 2014-05-09 NOTE — Patient Instructions (Signed)
STOP Lisinopril.  START Entresto 24/34m one tablet twice daily - WAIT until Thursday morning.  Return 1-2 weeks for lab work.  [facility]  Follow up 6 weeks.  Do the following things EVERYDAY: 1) Weigh yourself in the morning before breakfast. Write it down and keep it in a log. 2) Take your medicines as prescribed 3) Eat low salt foods-Limit salt (sodium) to 2000 mg per day.  4) Stay as active as you can everyday 5) Limit all fluids for the day to less than 2 liters

## 2014-05-10 NOTE — Progress Notes (Signed)
Patient ID: DERION KREITER, male   DOB: 1975/11/14, 39 y.o.   MRN: [id number] PCP: Dr. Ronnald Ramp  Mr. Bobrowski is a 39 y.o. gentlemen with history of VSD repair and removal of a pulmonary artery band at age 55, DM, HTN, systolic heart failure, NICM and OSA with CPAP.   Cardiac cath in April 2009 showed His LVEF was 35-40%. Coronary arteries were normal. RA mean 22 mmHg, RV 62/25, PA 59/39 mean 47 mmHg, mean pulmonary capillary wedge pressure 37 mmHg. Cardiac output was 6.21 with a cardiac index of 2.85 by FICK, and 4.95 and 2.27 respectively by thermodilution. There was no shunting at the atrial or ventricular level.   Echo 9/11: LVEF 20-25%   Echo November 1st, 2012: LVEF 30-35%, diffuse hypokinesis and mild LVH. RV systolic mildly reduced. LA mildly dilated. Pulmonic valve with mild stenosis.   Patient returns for cardiology evaluation today.  He has not been seen for 3 years and was referred back by his PCP.  He is taking his medications.  He is short of breath after walking 1-2 blocks if he walks briskly.  Mild dyspnea with stairs. No orthopnea/PND.  No chest pain.  No lightheadedness, syncope, or palpitations. He is using CPAP.   Labs (1/16): K 4.1, creatinine 0.9, TSH normal, LDL 89, HDL 46  ROS: All systems negative except as listed in HPI, PMH and Problem List.  Past Medical History  Diagnosis Date  . Diabetes mellitus   . Hypertension   . VSD (ventricular septal defect)   . Non-ischemic cardiomyopathy   . Obstructive sleep apnea   . CHF (congestive heart failure)     Systolic, EF 27% per echo 11/12    Current Outpatient Prescriptions  Medication Sig Dispense Refill  . aspirin 81 MG chewable tablet Chew 1 tablet (81 mg total) by mouth daily. 30 tablet 0  . carvedilol (COREG) 6.25 MG tablet Take 2 tablets (12.5 mg total) by mouth 2 (two) times daily with a meal. 60 tablet 5  . furosemide (LASIX) 20 MG tablet Take 1 tablet (20 mg total) by mouth 2 (two) times daily. 60 tablet 5  .  simvastatin (ZOCOR) 20 MG tablet Take 1 tablet (20 mg total) by mouth at bedtime. 30 tablet 5  . spironolactone (ALDACTONE) 25 MG tablet Take 1 tablet (25 mg total) by mouth daily. 30 tablet 5  . sacubitril-valsartan (ENTRESTO) 24-26 MG Take 1 tablet by mouth 2 (two) times daily. 60 tablet 1   No current facility-administered medications for this encounter.    PHYSICAL EXAM: Filed Vitals:   05/09/14 1354  BP: 113/76  Pulse: 86  Resp: 18  Weight: 202 lb 4 oz (91.74 kg)  SpO2: 97%    General: Well appearing. No respiratory difficulty  HEENT: normal  Neck: supple. JVD 7-8. Carotids 2+ bilat; no bruits. No lymphadenopathy or thryomegaly appreciated.  Cor: PMI nondisplaced. Regular rate & rhythm with widely split S2. No rubs, gallops or murmurs.  Lungs: clear  Abdomen: soft, nontender, nondistended. No hepatosplenomegaly. No bruits or masses. Good bowel sounds.  Extremities: no cyanosis, clubbing, rash, edema  Neuro: alert & oriented x 3, cranial nerves grossly intact. moves all 4 extremities w/o difficulty. Affect pleasant.   ASSESSMENT & PLAN: 1. Chronic systolic CHF: Nonischemic cardiomyopathy.  Last echo in 2012 showed EF 30-35%.  Currently NYHA class II symptoms.   - Echocardiogram to reassess LV systolic function.  - Continue current spironolactone, Coreg, and Lasix.  - Stop lisinopril, start Entresto 24/26  bid with BMET in 10 days.  2. H/o VSD: Will assess for residual shunt on echo.  Last RHC did not suggest any significant left to right shunting.  3. Pulmonary stenosis: Mild by last echo, will reassess.  4. OSA: Continue CPAP.  5. Will make social work referral to help with finding health insurance and to look into getting him an [employer].   Followup in 6 wks.   [name] 05/10/2014

## 2014-05-12 ENCOUNTER — Ambulatory Visit (HOSPITAL_COMMUNITY): Payer: Self-pay | Attending: Cardiology | Admitting: Radiology

## 2014-05-12 DIAGNOSIS — G4733 Obstructive sleep apnea (adult) (pediatric): Secondary | ICD-10-CM | POA: Insufficient documentation

## 2014-05-12 DIAGNOSIS — E119 Type 2 diabetes mellitus without complications: Secondary | ICD-10-CM | POA: Insufficient documentation

## 2014-05-12 DIAGNOSIS — I10 Essential (primary) hypertension: Secondary | ICD-10-CM | POA: Insufficient documentation

## 2014-05-12 DIAGNOSIS — Q210 Ventricular septal defect: Secondary | ICD-10-CM | POA: Insufficient documentation

## 2014-05-12 DIAGNOSIS — G210 Malignant neuroleptic syndrome: Secondary | ICD-10-CM

## 2014-05-12 DIAGNOSIS — I428 Other cardiomyopathies: Secondary | ICD-10-CM

## 2014-05-12 DIAGNOSIS — I5022 Chronic systolic (congestive) heart failure: Secondary | ICD-10-CM

## 2014-05-12 NOTE — Progress Notes (Signed)
Echocardiogram performed.

## 2014-05-24 ENCOUNTER — Ambulatory Visit (HOSPITAL_COMMUNITY)
Admission: RE | Admit: 2014-05-24 | Discharge: 2014-05-24 | Disposition: A | Discharge status: Home / Self Care | Payer: Self-pay | Source: Ambulatory Visit | Attending: Adult Health | Admitting: Adult Health

## 2014-05-24 DIAGNOSIS — I5022 Chronic systolic (congestive) heart failure: Secondary | ICD-10-CM

## 2014-05-24 LAB — BASIC METABOLIC PANEL
ANION GAP: 8 (ref 5–15)
BUN: 10 mg/dL (ref 6–23)
CHLORIDE: 106 mmol/L (ref 96–112)
CO2: 24 mmol/L (ref 19–32)
Calcium: 8.9 mg/dL (ref 8.4–10.5)
Creatinine, Ser: 0.97 mg/dL (ref 0.50–1.35)
GFR calc non Af Amer: >90 mL/min (ref >90)
Glucose, Bld: 98 mg/dL (ref 70–99)
POTASSIUM: 4.1 mmol/L (ref 3.5–5.1)
SODIUM: 138 mmol/L (ref 135–145)

## 2014-06-20 ENCOUNTER — Ambulatory Visit (HOSPITAL_COMMUNITY)
Admission: RE | Admit: 2014-06-20 | Discharge: 2014-06-20 | Disposition: A | Discharge status: Home / Self Care | Payer: Self-pay | Source: Ambulatory Visit | Attending: Cardiology | Admitting: Cardiology

## 2014-06-20 ENCOUNTER — Encounter: Payer: Self-pay | Admitting: Licensed Clinical Social Worker

## 2014-06-20 VITALS — BP 122/78 | HR 78 | Wt 203.0 lb

## 2014-06-20 DIAGNOSIS — Z79899 Other long term (current) drug therapy: Secondary | ICD-10-CM | POA: Insufficient documentation

## 2014-06-20 DIAGNOSIS — I371 Nonrheumatic pulmonary valve insufficiency: Secondary | ICD-10-CM | POA: Insufficient documentation

## 2014-06-20 DIAGNOSIS — Z8774 Personal history of (corrected) congenital malformations of heart and circulatory system: Secondary | ICD-10-CM | POA: Insufficient documentation

## 2014-06-20 DIAGNOSIS — G4733 Obstructive sleep apnea (adult) (pediatric): Secondary | ICD-10-CM

## 2014-06-20 DIAGNOSIS — I10 Essential (primary) hypertension: Secondary | ICD-10-CM | POA: Insufficient documentation

## 2014-06-20 DIAGNOSIS — I429 Cardiomyopathy, unspecified: Secondary | ICD-10-CM

## 2014-06-20 DIAGNOSIS — Z7982 Long term (current) use of aspirin: Secondary | ICD-10-CM | POA: Insufficient documentation

## 2014-06-20 DIAGNOSIS — I5022 Chronic systolic (congestive) heart failure: Secondary | ICD-10-CM

## 2014-06-20 DIAGNOSIS — I428 Other cardiomyopathies: Secondary | ICD-10-CM

## 2014-06-20 DIAGNOSIS — Z9989 Dependence on other enabling machines and devices: Secondary | ICD-10-CM

## 2014-06-20 DIAGNOSIS — E119 Type 2 diabetes mellitus without complications: Secondary | ICD-10-CM | POA: Insufficient documentation

## 2014-06-20 DIAGNOSIS — I159 Secondary hypertension, unspecified: Secondary | ICD-10-CM

## 2014-06-20 DIAGNOSIS — Q210 Ventricular septal defect: Secondary | ICD-10-CM

## 2014-06-20 MED ORDER — SACUBITRIL-VALSARTAN 49-51 MG PO TABS: 1.0000 | ORAL_TABLET | Freq: Two times a day (BID) | ORAL | Status: DC

## 2014-06-20 NOTE — Patient Instructions (Signed)
Increase Entresto to 49/51 mg Twice daily, we have sent you in a new prescription at [facility] and have activated a free trial card for you.  Please contact [facility] at [phone] and let them know you need assistance affording medication  Labs in 7-10 days  Your physician has recommended that you have a cardiopulmonary stress test (CPX). CPX testing is a non-invasive measurement of heart and lung function. It replaces a traditional treadmill stress test. This type of test provides a tremendous amount of information that relates not only to your present condition but also for future outcomes. This test combines measurements of you ventilation, respiratory gas exchange in the lungs, electrocardiogram (EKG), blood pressure and physical response before, during, and following an exercise protocol.  Your physician recommends that you schedule a follow-up appointment in: 6 weeks

## 2014-06-20 NOTE — Progress Notes (Addendum)
Patient ID: Kevin Cardenas, male   DOB: 01/29/1976, 39 y.o.   MRN: [id number] PCP: Dr. Ronnald Ramp  Kevin Cardenas is a 39 y.o. gentlemen with history of VSD repair and removal of a pulmonary artery band at age 39, DM, HTN, systolic heart failure, NICM and OSA with CPAP.   Cardiac cath in April 2009 showed His LVEF was 35-40%. Coronary arteries were normal. RA mean 22 mmHg, RV 62/25, PA 59/39 mean 47 mmHg, mean pulmonary capillary wedge pressure 37 mmHg. Cardiac output was 6.21 with a cardiac index of 2.85 by FICK, and 4.95 and 2.27 respectively by thermodilution. There was no shunting at the atrial or ventricular level.   He returns for follow up. Last visit lisinopril was stopped and he started entresto 24/26. Can walk 3-4 blocks but gets tired. + Bendopnea. No orthopnea.  Does admit to dyspnea with steps. Uses CPAP nightly. Weight at home 199-200 pounds. Taking all medications. Currently not working. Lives with his Mom and wife. Does smoke or drink alcohol.    Echo 9/11: LVEF 20-25%  Echo November 1st, 2012: LVEF 30-35%, diffuse hypokinesis and mild LVH. RV systolic mildly reduced. LA mildly dilated. Pulmonic valve with mild stenosis.  Echo 05/12/2014 EF 15-20% Grade IDD, LV moderately dilated.  RV normal. Mild pulmonc stenosis mean peak gradient 14.   Labs (1/16): K 4.1, creatinine 0.9, TSH normal, LDL 89, HDL 46 Labs 05/24/2014: K 4.1 Creatinine 0.97   ECG: NSR, LVH with repolarization abnormality  ROS: All systems negative except as listed in HPI, PMH and Problem List.  Past Medical History  Diagnosis Date  . Diabetes mellitus   . Hypertension   . VSD (ventricular septal defect)   . Non-ischemic cardiomyopathy   . Obstructive sleep apnea   . CHF (congestive heart failure)     Systolic, EF 97% per echo 11/12    Current Outpatient Prescriptions  Medication Sig Dispense Refill  . aspirin 81 MG chewable tablet Chew 1 tablet (81 mg total) by mouth daily. 30 tablet 0  . carvedilol (COREG) 6.25 MG  tablet Take 2 tablets (12.5 mg total) by mouth 2 (two) times daily with a meal. 60 tablet 5  . furosemide (LASIX) 20 MG tablet Take 1 tablet (20 mg total) by mouth 2 (two) times daily. 60 tablet 5  . sacubitril-valsartan (ENTRESTO) 24-26 MG Take 1 tablet by mouth 2 (two) times daily. 60 tablet 1  . simvastatin (ZOCOR) 20 MG tablet Take 1 tablet (20 mg total) by mouth at bedtime. 30 tablet 5  . spironolactone (ALDACTONE) 25 MG tablet Take 1 tablet (25 mg total) by mouth daily. 30 tablet 5   No current facility-administered medications for this encounter.    PHYSICAL EXAM: Filed Vitals:   06/20/14 1336  BP: 122/78  Pulse: 78  Weight: 203 lb (92.08 kg)  SpO2: 98%    General: Well appearing. No respiratory difficulty  HEENT: normal  Neck: supple. JVD 7-8. Carotids 2+ bilat; no bruits. No lymphadenopathy or thyromegaly appreciated.  Cor: PMI nondisplaced. Regular rate & rhythm with widely split S2. No rubs, gallops.  2/6 SEM LUSB.  Lungs: clear  Abdomen: soft, nontender, nondistended. No hepatosplenomegaly. No bruits or masses. Good bowel sounds.  Extremities: no cyanosis, clubbing, rash, edema  Neuro: alert & oriented x 3, cranial nerves grossly intact. moves all 4 extremities w/o difficulty. Affect pleasant.   ASSESSMENT & PLAN: 1. Chronic systolic CHF: Nonischemic cardiomyopathy.  Last echo 04/2014 EF 15-20% RV normal.  NYHA class III  symptoms.  Euvolemic on exam.   -Volume status stable. Continue lasix 20 mg twice a day + 25 mg spironolactone daily -Continue 12.5 mg carvedilol twice a day. - Increase Entresto to 49/51 twice a day.   - Set up for CPX 2. H/o VSD: No residual shunt noted on last echo.  Last RHC also did not suggest any significant left to right shunting.  3. Pulmonary stenosis: Very mild by last echo in January.  4. OSA: Continue CPAP.  5. Ask HF SW to met with him today for orange card.     CLEGG,AMY NP_C  06/20/2014   Patient seen with NP, agree with the above  note. He looks euvolemic on exam.  - Increase Entresto as above.   - Arrange for CPX.  - BMET/BNP in 10 days.   Followup 6 wks.   [name] 06/21/2014

## 2014-06-20 NOTE — Progress Notes (Signed)
CSW referred to assist with [facility] card. 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 CSW provided process and location for orange card application. Patient verbalizes understanding of follow up and will contact CSW if further needs arise. CSW continues to be available as needed. Raquel Sarna, [place]

## 2014-06-21 DIAGNOSIS — Z9989 Dependence on other enabling machines and devices: Secondary | ICD-10-CM

## 2014-06-21 DIAGNOSIS — G4733 Obstructive sleep apnea (adult) (pediatric): Secondary | ICD-10-CM | POA: Insufficient documentation

## 2014-06-21 NOTE — Addendum Note (Signed)
Encounter addended by: Larey Dresser, MD on: 06/21/2014  8:47 PM<BR>     Documentation filed: Follow-up Section, LOS Section, Notes Section

## 2014-06-28 ENCOUNTER — Ambulatory Visit (HOSPITAL_COMMUNITY)
Admission: RE | Admit: 2014-06-28 | Discharge: 2014-06-28 | Disposition: A | Discharge status: Home / Self Care | Payer: Self-pay | Source: Ambulatory Visit | Attending: Internal Medicine | Admitting: Internal Medicine

## 2014-06-28 DIAGNOSIS — I5022 Chronic systolic (congestive) heart failure: Secondary | ICD-10-CM | POA: Insufficient documentation

## 2014-06-28 LAB — BASIC METABOLIC PANEL
Anion gap: 9 (ref 5–15)
BUN: 17 mg/dL (ref 6–23)
CALCIUM: 8.9 mg/dL (ref 8.4–10.5)
CHLORIDE: 105 mmol/L (ref 96–112)
CO2: 25 mmol/L (ref 19–32)
CREATININE: 1 mg/dL (ref 0.50–1.35)
GFR calc Af Amer: >90 mL/min (ref >90)
GLUCOSE: 92 mg/dL (ref 70–99)
Potassium: 4.4 mmol/L (ref 3.5–5.1)
Sodium: 139 mmol/L (ref 135–145)

## 2014-06-28 LAB — BRAIN NATRIURETIC PEPTIDE: B Natriuretic Peptide: 54.1 pg/mL (ref 0.0–100.0)

## 2014-07-11 ENCOUNTER — Ambulatory Visit (HOSPITAL_COMMUNITY): Payer: Self-pay | Attending: Cardiology

## 2014-07-11 DIAGNOSIS — I5032 Chronic diastolic (congestive) heart failure: Secondary | ICD-10-CM | POA: Insufficient documentation

## 2014-07-11 DIAGNOSIS — I5022 Chronic systolic (congestive) heart failure: Secondary | ICD-10-CM

## 2014-07-28 ENCOUNTER — Ambulatory Visit (HOSPITAL_COMMUNITY)
Admission: RE | Admit: 2014-07-28 | Discharge: 2014-07-28 | Disposition: A | Discharge status: Home / Self Care | Payer: Self-pay | Source: Ambulatory Visit | Attending: Cardiology | Admitting: Cardiology

## 2014-07-28 ENCOUNTER — Other Ambulatory Visit: Payer: Self-pay | Admitting: *Deleted

## 2014-07-28 ENCOUNTER — Encounter (HOSPITAL_COMMUNITY): Payer: Self-pay | Admitting: *Deleted

## 2014-07-28 ENCOUNTER — Telehealth (HOSPITAL_COMMUNITY): Payer: Self-pay | Admitting: Cardiology

## 2014-07-28 VITALS — BP 122/78 | HR 102 | Wt 206.8 lb

## 2014-07-28 DIAGNOSIS — I10 Essential (primary) hypertension: Secondary | ICD-10-CM

## 2014-07-28 DIAGNOSIS — Z0000 Encounter for general adult medical examination without abnormal findings: Secondary | ICD-10-CM

## 2014-07-28 DIAGNOSIS — I5022 Chronic systolic (congestive) heart failure: Secondary | ICD-10-CM | POA: Insufficient documentation

## 2014-07-28 DIAGNOSIS — G4733 Obstructive sleep apnea (adult) (pediatric): Secondary | ICD-10-CM | POA: Insufficient documentation

## 2014-07-28 DIAGNOSIS — Q256 Stenosis of pulmonary artery: Secondary | ICD-10-CM | POA: Insufficient documentation

## 2014-07-28 DIAGNOSIS — Q210 Ventricular septal defect: Secondary | ICD-10-CM | POA: Insufficient documentation

## 2014-07-28 DIAGNOSIS — Z87891 Personal history of nicotine dependence: Secondary | ICD-10-CM | POA: Insufficient documentation

## 2014-07-28 DIAGNOSIS — Z9989 Dependence on other enabling machines and devices: Secondary | ICD-10-CM

## 2014-07-28 MED ORDER — SACUBITRIL-VALSARTAN 49-51 MG PO TABS: 1.0000 | ORAL_TABLET | Freq: Two times a day (BID) | ORAL | Status: DC

## 2014-07-28 MED ORDER — FUROSEMIDE 20 MG PO TABS
20.0000 mg | ORAL_TABLET | Freq: Two times a day (BID) | ORAL | Status: DC
Start: 2014-07-28 — End: 2014-09-25

## 2014-07-28 NOTE — Telephone Encounter (Signed)
Pt scheduled for RHC on 08/03/14 with [name] As of 07/28/14 pt does not have insurance so NPCR Cpt [phone]

## 2014-07-28 NOTE — Patient Instructions (Addendum)
Restart your Furosemide (Lasix) 20 mg Twice daily  Please bring [place] your income information so we can send in your application for assistance with Delene Loll  You have been referred to Pulmonary for sleep apnea and abnormal pft's  Right Heart Catheterization is sch for 08/03/14, see instruction sheet  Your physician recommends that you schedule a follow-up appointment in: 3 weeks

## 2014-07-28 NOTE — Progress Notes (Signed)
Ambulated pt in hallway and O2 sats ranged from 96-99% on RA

## 2014-07-30 NOTE — Progress Notes (Signed)
Patient ID: Kevin Cardenas, male   DOB: 01/23/1976, 39 y.o.   MRN: [id number] PCP: Dr. Jones  [name] is a 39 y.o. gentlemen with history of VSD repair and removal of a pulmonary artery band at age 1, DM, HTN, systolic heart failure, NICM and OSA with CPAP.   Cardiac cath in April 2009 showed His LVEF was 35-40%. Coronary arteries were normal. RA mean 22 mmHg, RV 62/25, PA 59/39 mean 47 mmHg, mean pulmonary capillary wedge pressure 37 mmHg. Cardiac output was 6.21 with a cardiac index of 2.85 by FICK, and 4.95 and 2.27 respectively by thermodilution. There was no shunting at the atrial or ventricular level.   He returns for follow up. Stable symptomatically.  Dyspnea with heavy exertion. No orthopnea, PND, palpitations, or lightheadedness.  Using CPAP at night.  Taking all medications except ran out of Entresto and Lasix this week. Currently not working. Lives with his Mom and wife. Does smoke or drink alcohol.    We walked him in the hall today, oxygen saturation dropped to 96% at the lowest.   Echo 9/11: LVEF 20-25%  Echo November 1st, 2012: LVEF 30-35%, diffuse hypokinesis and mild LVH. RV systolic mildly reduced. LA mildly dilated. Pulmonic valve with mild stenosis.  Echo 05/12/2014: EF 15-20% Grade IDD, LV moderately dilated.  RV normal. Mild pulmonc stenosis mean peak gradient 14.   CPX (3/16): RER 1.03, peak VO2 19.9, VE/VCO2 slope 18, mild to moderately impaired functional capacity.  There was moderate to severe mixed restrictive/obstructive pattern on spirometry => exertional hypoxemia, oxygen saturation decreased to 84%.   Labs (1/16): K 4.1, creatinine 0.9, TSH normal, LDL 89, HDL 46 Labs (05/24/2014): K 4.1 Creatinine 0.97  Labs (3/16): BNP 54, K 4.4, creatinine 1.0  ECG: NSR, LVH with repolarization abnormality  ROS: All systems negative except as listed in HPI, PMH and Problem List.  Past Medical History  Diagnosis Date  . Diabetes mellitus   . Hypertension   . VSD  (ventricular septal defect)   . Non-ischemic cardiomyopathy   . Obstructive sleep apnea   . CHF (congestive heart failure)     Systolic, EF 30% per echo 11/12    Current Outpatient Prescriptions  Medication Sig Dispense Refill  . aspirin 81 MG chewable tablet Chew 1 tablet (81 mg total) by mouth daily. 30 tablet 0  . carvedilol (COREG) 6.25 MG tablet Take 2 tablets (12.5 mg total) by mouth 2 (two) times daily with a meal. 60 tablet 5  . simvastatin (ZOCOR) 20 MG tablet Take 1 tablet (20 mg total) by mouth at bedtime. 30 tablet 5  . spironolactone (ALDACTONE) 25 MG tablet Take 1 tablet (25 mg total) by mouth daily. 30 tablet 5  . furosemide (LASIX) 20 MG tablet Take 1 tablet (20 mg total) by mouth 2 (two) times daily. 60 tablet 5  . sacubitril-valsartan (ENTRESTO) 49-51 MG Take 1 tablet by mouth 2 (two) times daily. 60 tablet 3   No current facility-administered medications for this encounter.    PHYSICAL EXAM: Filed Vitals:   07/28/14 1427  BP: 122/78  Pulse: 102  Weight: 206 lb 12 oz (93.781 kg)  SpO2: 98%    General: Well appearing. No respiratory difficulty  HEENT: normal  Neck: Thick. JVP 8. Carotids 2+ bilat; no bruits. No lymphadenopathy or thyromegaly appreciated.  Cor: PMI nondisplaced. Regular rate & rhythm with widely split S2. No rubs, gallops.  2/6 SEM LUSB.  Lungs: End expiratory wheezes Abdomen: soft, nontender, nondistended. No   hepatosplenomegaly. No bruits or masses. Good bowel sounds.  Extremities: no cyanosis, clubbing, rash, edema  Neuro: alert & oriented x 3, cranial nerves grossly intact. moves all 4 extremities w/o difficulty. Affect pleasant.   ASSESSMENT & PLAN: 1. Chronic systolic CHF: Nonischemic cardiomyopathy.  Last echo 04/2014 EF 15-20% RV normal.  NYHA class II symptoms.  Euvolemic on exam. CPX in 3/16 showed a mild to moderate functional limitation, but PFTs were quite abnormal with a mixed restrictive/obstructive picture. There was exertional  hypoxemia that could not be reproduced today.  Lung abnormalities raise some concern for the possibility of pulmonary hypertension.   -Volume status stable. Continue lasix 20 mg twice a day + 25 mg spironolactone daily -Continue 12.5 mg carvedilol twice a day. - Refill Entresto 49/51 twice a day.   - I will arrange for RHC to assess cardiac output and look for pulmonary hypertension.  - Repeat echo in 7/16.  If EF remains low, arrange for ICD.  Last QRS 128 msec, probably not much benefit to CRT.  2. H/o VSD: No residual shunt noted on last echo.  Last RHC also did not suggest any significant left to right shunting.  3. Pulmonary stenosis: Very mild by last echo in 1/16.  4. OSA: Continue CPAP.  I will refer to sleep medicine for evaluation/adjustment, has been a long time since this has been evaluated.  5. Abnormal PFTs: I would like him to have a formal pulmonary evaluation, will refer.    [name] 07/30/2014

## 2014-07-30 NOTE — Addendum Note (Signed)
Encounter addended by: Larey Dresser, MD on: 07/30/2014 10:21 PM<BR>     Documentation filed: Follow-up Section, LOS Section

## 2014-08-01 ENCOUNTER — Encounter (HOSPITAL_COMMUNITY): Payer: Self-pay

## 2014-08-03 ENCOUNTER — Ambulatory Visit (HOSPITAL_COMMUNITY)
Admission: RE | Admit: 2014-08-03 | Discharge: 2014-08-03 | Disposition: A | Discharge status: Home / Self Care | Payer: Self-pay | Source: Ambulatory Visit | Attending: Cardiology | Admitting: Cardiology

## 2014-08-03 ENCOUNTER — Encounter (HOSPITAL_COMMUNITY)
Admission: RE | Disposition: A | Discharge status: Home / Self Care | Payer: Self-pay | Source: Ambulatory Visit | Attending: Cardiology

## 2014-08-03 ENCOUNTER — Encounter (HOSPITAL_COMMUNITY): Payer: Self-pay | Admitting: Cardiology

## 2014-08-03 DIAGNOSIS — I10 Essential (primary) hypertension: Secondary | ICD-10-CM | POA: Insufficient documentation

## 2014-08-03 DIAGNOSIS — E119 Type 2 diabetes mellitus without complications: Secondary | ICD-10-CM | POA: Insufficient documentation

## 2014-08-03 DIAGNOSIS — G4733 Obstructive sleep apnea (adult) (pediatric): Secondary | ICD-10-CM | POA: Insufficient documentation

## 2014-08-03 DIAGNOSIS — I5022 Chronic systolic (congestive) heart failure: Secondary | ICD-10-CM | POA: Insufficient documentation

## 2014-08-03 DIAGNOSIS — Z9889 Other specified postprocedural states: Secondary | ICD-10-CM | POA: Insufficient documentation

## 2014-08-03 DIAGNOSIS — Z79899 Other long term (current) drug therapy: Secondary | ICD-10-CM | POA: Insufficient documentation

## 2014-08-03 DIAGNOSIS — Z9981 Dependence on supplemental oxygen: Secondary | ICD-10-CM | POA: Insufficient documentation

## 2014-08-03 DIAGNOSIS — R942 Abnormal results of pulmonary function studies: Secondary | ICD-10-CM | POA: Insufficient documentation

## 2014-08-03 DIAGNOSIS — I270 Primary pulmonary hypertension: Secondary | ICD-10-CM

## 2014-08-03 DIAGNOSIS — I429 Cardiomyopathy, unspecified: Secondary | ICD-10-CM | POA: Insufficient documentation

## 2014-08-03 DIAGNOSIS — Z7982 Long term (current) use of aspirin: Secondary | ICD-10-CM | POA: Insufficient documentation

## 2014-08-03 DIAGNOSIS — I370 Nonrheumatic pulmonary valve stenosis: Secondary | ICD-10-CM | POA: Insufficient documentation

## 2014-08-03 HISTORY — PX: RIGHT HEART CATHETERIZATION: SHX5447

## 2014-08-03 LAB — POCT I-STAT 3, VENOUS BLOOD GAS (G3P V)
ACID-BASE DEFICIT: 2 mmol/L (ref 0.0–2.0)
ACID-BASE DEFICIT: 2 mmol/L (ref 0.0–2.0)
ACID-BASE DEFICIT: 2 mmol/L (ref 0.0–2.0)
ACID-BASE DEFICIT: 2 mmol/L (ref 0.0–2.0)
Acid-base deficit: 2 mmol/L (ref 0.0–2.0)
BICARBONATE: 24.5 meq/L — AB (ref 20.0–24.0)
BICARBONATE: 24.8 meq/L — AB (ref 20.0–24.0)
Bicarbonate: 24.3 mEq/L — ABNORMAL HIGH (ref 20.0–24.0)
Bicarbonate: 24.4 mEq/L — ABNORMAL HIGH (ref 20.0–24.0)
Bicarbonate: 24.6 mEq/L — ABNORMAL HIGH (ref 20.0–24.0)
O2 SAT: 60 %
O2 SAT: 61 %
O2 Saturation: 54 %
O2 Saturation: 56 %
O2 Saturation: 60 %
PH VEN: 7.306 — AB (ref 7.250–7.300)
PH VEN: 7.316 — AB (ref 7.250–7.300)
PO2 VEN: 33 mmHg (ref 30.0–45.0)
PO2 VEN: 34 mmHg (ref 30.0–45.0)
TCO2: 26 mmol/L (ref 0–100)
TCO2: 26 mmol/L (ref 0–100)
TCO2: 26 mmol/L (ref 0–100)
TCO2: 26 mmol/L (ref 0–100)
TCO2: 26 mmol/L (ref 0–100)
pCO2, Ven: 46.9 mmHg (ref 45.0–50.0)
pCO2, Ven: 47.1 mmHg (ref 45.0–50.0)
pCO2, Ven: 47.8 mmHg (ref 45.0–50.0)
pCO2, Ven: 48.2 mmHg (ref 45.0–50.0)
pCO2, Ven: 49.1 mmHg (ref 45.0–50.0)
pH, Ven: 7.313 — ABNORMAL HIGH (ref 7.250–7.300)
pH, Ven: 7.324 — ABNORMAL HIGH (ref 7.250–7.300)
pH, Ven: 7.33 — ABNORMAL HIGH (ref 7.250–7.300)
pO2, Ven: 31 mmHg (ref 30.0–45.0)
pO2, Ven: 34 mmHg (ref 30.0–45.0)
pO2, Ven: 34 mmHg (ref 30.0–45.0)

## 2014-08-03 LAB — BASIC METABOLIC PANEL
Anion gap: 9 (ref 5–15)
BUN: 11 mg/dL (ref 6–23)
CHLORIDE: 105 mmol/L (ref 96–112)
CO2: 26 mmol/L (ref 19–32)
Calcium: 8.5 mg/dL (ref 8.4–10.5)
Creatinine, Ser: 0.93 mg/dL (ref 0.50–1.35)
GFR calc Af Amer: >90 mL/min (ref >90)
GFR calc non Af Amer: >90 mL/min (ref >90)
Glucose, Bld: 95 mg/dL (ref 70–99)
Potassium: 3.9 mmol/L (ref 3.5–5.1)
Sodium: 140 mmol/L (ref 135–145)

## 2014-08-03 LAB — PROTIME-INR
INR: 1.06 (ref 0.00–1.49)
PROTHROMBIN TIME: 13.9 s (ref 11.6–15.2)

## 2014-08-03 LAB — CBC
HCT: 44.6 % (ref 39.0–52.0)
Hemoglobin: 14.9 g/dL (ref 13.0–17.0)
MCH: 32.3 pg (ref 26.0–34.0)
MCHC: 33.4 g/dL (ref 30.0–36.0)
MCV: 96.7 fL (ref 78.0–100.0)
PLATELETS: 139 10*3/uL — AB (ref 150–400)
RBC: 4.61 MIL/uL (ref 4.22–5.81)
RDW: 13.3 % (ref 11.5–15.5)
WBC: 3.4 10*3/uL — AB (ref 4.0–10.5)

## 2014-08-03 LAB — GLUCOSE, CAPILLARY
Glucose-Capillary: 101 mg/dL — ABNORMAL HIGH (ref 70–99)
Glucose-Capillary: 76 mg/dL (ref 70–99)

## 2014-08-03 SURGERY — RIGHT HEART CATH
Anesthesia: LOCAL

## 2014-08-03 MED ORDER — ACETAMINOPHEN 325 MG PO TABS: 650.0000 mg | ORAL_TABLET | ORAL | Status: DC | PRN

## 2014-08-03 MED ORDER — HEPARIN (PORCINE) IN NACL 2-0.9 UNIT/ML-% IJ SOLN
INTRAMUSCULAR | Status: AC
  Filled 2014-08-03: qty 1000

## 2014-08-03 MED ORDER — MIDAZOLAM HCL 2 MG/2ML IJ SOLN
INTRAMUSCULAR | Status: AC
  Filled 2014-08-03: qty 2

## 2014-08-03 MED ORDER — SODIUM CHLORIDE 0.9 % IV SOLN: INTRAVENOUS | Status: DC

## 2014-08-03 MED ORDER — SODIUM CHLORIDE 0.9 % IJ SOLN: 3.0000 mL | Freq: Two times a day (BID) | INTRAMUSCULAR | Status: DC

## 2014-08-03 MED ORDER — ONDANSETRON HCL 4 MG/2ML IJ SOLN: 4.0000 mg | Freq: Four times a day (QID) | INTRAMUSCULAR | Status: DC | PRN

## 2014-08-03 MED ORDER — SODIUM CHLORIDE 0.9 % IJ SOLN: 3.0000 mL | INTRAMUSCULAR | Status: DC | PRN

## 2014-08-03 MED ORDER — FENTANYL CITRATE (PF) 100 MCG/2ML IJ SOLN
INTRAMUSCULAR | Status: AC
  Filled 2014-08-03: qty 2

## 2014-08-03 MED ORDER — SODIUM CHLORIDE 0.9 % IV SOLN: 250.0000 mL | INTRAVENOUS | Status: DC | PRN

## 2014-08-03 MED ORDER — LIDOCAINE HCL (PF) 1 % IJ SOLN
INTRAMUSCULAR | Status: AC
  Filled 2014-08-03: qty 30

## 2014-08-03 MED ORDER — ASPIRIN 81 MG PO CHEW
CHEWABLE_TABLET | ORAL | Status: AC
  Administered 2014-08-03: 81 mg
  Filled 2014-08-03: qty 1

## 2014-08-03 NOTE — Progress Notes (Signed)
Pt eating and sipping fluids, denies dizziness.

## 2014-08-03 NOTE — Interval H&P Note (Signed)
History and Physical Interval Note:  08/03/2014 1:39 PM  Kevin Cardenas  has presented today for surgery, with the diagnosis of chf  The various methods of treatment have been discussed with the patient and family. After consideration of risks, benefits and other options for treatment, the patient has consented to  Procedure(s): RIGHT HEART CATH (N/A) as a surgical intervention .  The patient's history has been reviewed, patient examined, no change in status, stable for surgery.  I have reviewed the patient's chart and labs.  Questions were answered to the patient's satisfaction.     Dalton [employer]

## 2014-08-03 NOTE — Discharge Instructions (Signed)
Venogram, Care After post right heart cath. Refer to this sheet in the next few weeks. These instructions provide you with information on caring for yourself after your procedure. Your health care provider may also give you more specific instructions. Your treatment has been planned according to current medical practices, but problems sometimes occur. Call your health care provider if you have any problems or questions after your procedure. WHAT TO EXPECT AFTER THE PROCEDURE After your procedure, it is typical to have the following sensations:  Mild discomfort at the catheter insertion site.  If your puncture site bleeds/ hold pressure 20 minutes. Hold for 10 more if oozing continues.  HOME CARE INSTRUCTIONS   Take all medicines exactly as directed.  Follow any prescribed diet.  Follow instructions regarding both rest and physical activity.  Drink more fluids for the first several days after the procedure in order to help flush dye from your kidneys. SEEK MEDICAL CARE IF:  You develop a rash.  You have fever not controlled by medicine. SEEK IMMEDIATE MEDICAL CARE IF:  There is pain, drainage, bleeding, redness, swelling, warmth or a red streak at the site of the IV tube.  The extremity where your IV tube was placed becomes discolored, numb, or cool.  You have difficulty breathing or shortness of breath.  You develop chest pain.  You have excessive dizziness or fainting. Document Released: 01/19/2013 Document Revised: 04/05/2013 Document Reviewed: 01/19/2013 [name] Patient Information 2015 [place], [place]. This information is not intended to replace advice given to you by your health care provider. Make sure you discuss any questions you have with your health care provider.

## 2014-08-03 NOTE — H&P (View-Only) (Signed)
Patient ID: Kevin Cardenas, male   DOB: [birth year], 39 y.o.   MRN: [id number] PCP: Dr. Ronnald Ramp  Kevin Cardenas is a 39 y.o. gentlemen with history of VSD repair and removal of a pulmonary artery band at age 39, DM, HTN, systolic heart failure, NICM and OSA with CPAP.   Cardiac cath in April 2009 showed His LVEF was 35-40%. Coronary arteries were normal. RA mean 22 mmHg, RV 62/25, PA 59/39 mean 47 mmHg, mean pulmonary capillary wedge pressure 37 mmHg. Cardiac output was 6.21 with a cardiac index of 2.85 by FICK, and 4.95 and 2.27 respectively by thermodilution. There was no shunting at the atrial or ventricular level.   He returns for follow up. Stable symptomatically.  Dyspnea with heavy exertion. No orthopnea, PND, palpitations, or lightheadedness.  Using CPAP at night.  Taking all medications except ran out of Entresto and Lasix this week. Currently not working. Lives with his Mom and wife. Does smoke or drink alcohol.    We walked him in the hall today, oxygen saturation dropped to 96% at the lowest.   Echo 9/11: LVEF 20-25%  Echo November 1st, 2012: LVEF 30-35%, diffuse hypokinesis and mild LVH. RV systolic mildly reduced. LA mildly dilated. Pulmonic valve with mild stenosis.  Echo 05/12/2014: EF 15-20% Grade IDD, LV moderately dilated.  RV normal. Mild pulmonc stenosis mean peak gradient 14.   CPX (3/16): RER 1.03, peak VO2 19.9, VE/VCO2 slope 18, mild to moderately impaired functional capacity.  There was moderate to severe mixed restrictive/obstructive pattern on spirometry => exertional hypoxemia, oxygen saturation decreased to 84%.   Labs (1/16): K 4.1, creatinine 0.9, TSH normal, LDL 89, HDL 46 Labs (05/24/2014): K 4.1 Creatinine 0.97  Labs (3/16): BNP 54, K 4.4, creatinine 1.0  ECG: NSR, LVH with repolarization abnormality  ROS: All systems negative except as listed in HPI, PMH and Problem List.  Past Medical History  Diagnosis Date  . Diabetes mellitus   . Hypertension   . VSD  (ventricular septal defect)   . Non-ischemic cardiomyopathy   . Obstructive sleep apnea   . CHF (congestive heart failure)     Systolic, EF 10% per echo 11/12    Current Outpatient Prescriptions  Medication Sig Dispense Refill  . aspirin 81 MG chewable tablet Chew 1 tablet (81 mg total) by mouth daily. 30 tablet 0  . carvedilol (COREG) 6.25 MG tablet Take 2 tablets (12.5 mg total) by mouth 2 (two) times daily with a meal. 60 tablet 5  . simvastatin (ZOCOR) 20 MG tablet Take 1 tablet (20 mg total) by mouth at bedtime. 30 tablet 5  . spironolactone (ALDACTONE) 25 MG tablet Take 1 tablet (25 mg total) by mouth daily. 30 tablet 5  . furosemide (LASIX) 20 MG tablet Take 1 tablet (20 mg total) by mouth 2 (two) times daily. 60 tablet 5  . sacubitril-valsartan (ENTRESTO) 49-51 MG Take 1 tablet by mouth 2 (two) times daily. 60 tablet 3   No current facility-administered medications for this encounter.    PHYSICAL EXAM: Filed Vitals:   07/28/14 1427  BP: 122/78  Pulse: 102  Weight: 206 lb 12 oz (93.781 kg)  SpO2: 98%    General: Well appearing. No respiratory difficulty  HEENT: normal  Neck: Thick. JVP 8. Carotids 2+ bilat; no bruits. No lymphadenopathy or thyromegaly appreciated.  Cor: PMI nondisplaced. Regular rate & rhythm with widely split S2. No rubs, gallops.  2/6 SEM LUSB.  Lungs: End expiratory wheezes Abdomen: soft, nontender, nondistended. No  hepatosplenomegaly. No bruits or masses. Good bowel sounds.  Extremities: no cyanosis, clubbing, rash, edema  Neuro: alert & oriented x 3, cranial nerves grossly intact. moves all 4 extremities w/o difficulty. Affect pleasant.   ASSESSMENT & PLAN: 1. Chronic systolic CHF: Nonischemic cardiomyopathy.  Last echo 04/2014 EF 15-20% RV normal.  NYHA class II symptoms.  Euvolemic on exam. CPX in 3/16 showed a mild to moderate functional limitation, but PFTs were quite abnormal with a mixed restrictive/obstructive picture. There was exertional  hypoxemia that could not be reproduced today.  Lung abnormalities raise some concern for the possibility of pulmonary hypertension.   -Volume status stable. Continue lasix 20 mg twice a day + 25 mg spironolactone daily -Continue 12.5 mg carvedilol twice a day. - Refill Entresto 49/51 twice a day.   - I will arrange for RHC to assess cardiac output and look for pulmonary hypertension.  - Repeat echo in 7/16.  If EF remains low, arrange for ICD.  Last QRS 128 msec, probably not much benefit to CRT.  2. H/o VSD: No residual shunt noted on last echo.  Last RHC also did not suggest any significant left to right shunting.  3. Pulmonary stenosis: Very mild by last echo in 1/16.  4. OSA: Continue CPAP.  I will refer to sleep medicine for evaluation/adjustment, has been a long time since this has been evaluated.  5. Abnormal PFTs: I would like him to have a formal pulmonary evaluation, will refer.    [name] 07/30/2014

## 2014-08-03 NOTE — CV Procedure (Signed)
Cardiac Catheterization Procedure Note  Name: Kevin Cardenas MRN: [id number] DOB: 01-Dec-1975  Procedure: Right Heart Cath  Indication: Assess for pulmonary hypertension, cardiac output and filling pressures.    Procedural Details: The right groin was prepped, draped, and anesthetized with 1% lidocaine. Using the modified Seldinger technique a 7 French sheath was placed in the right femoral vein. A Swan-Ganz catheter was used for the right heart catheterization. Standard protocol was followed for recording of right heart pressures and sampling of oxygen saturations. Fick and thermodilution cardiac output was calculated. There were no immediate procedural complications. The patient was transferred to the post catheterization recovery area for further monitoring.  Procedural Findings: Hemodynamics (mmHg) RA mean 9 RV 42/9 PA 35/12, mean 22 PCWP mean 8  Peak to peak gradient across pulmonic valve 7 mmHg, mean gradient 6 mmHg.   Oxygen saturations: RA 56% RV 60% PA 61% AO 93%  Cardiac Output (Fick) 3.44  Cardiac Index (Fick) 1.77  Cardiac Output (Thermo) 4.73 Cardiac Index (Thermo) 2.44   Final Conclusions:  Normal PCWP and PA pressure.  Mild pulmonic stenosis.  Cardiac output significantly decreased by Fick but only mildly decreased by thermodilution.  Will follow closely in clinic, may need addition of digoxin.    [name] 08/03/2014, 2:30 PM

## 2014-08-14 ENCOUNTER — Ambulatory Visit (HOSPITAL_COMMUNITY)
Admission: RE | Admit: 2014-08-14 | Discharge: 2014-08-14 | Disposition: A | Discharge status: Home / Self Care | Payer: Self-pay | Source: Ambulatory Visit | Attending: Cardiology | Admitting: Cardiology

## 2014-08-14 VITALS — BP 110/86 | HR 79 | Wt 203.1 lb

## 2014-08-14 DIAGNOSIS — G4733 Obstructive sleep apnea (adult) (pediatric): Secondary | ICD-10-CM

## 2014-08-14 DIAGNOSIS — I5022 Chronic systolic (congestive) heart failure: Secondary | ICD-10-CM

## 2014-08-14 DIAGNOSIS — E119 Type 2 diabetes mellitus without complications: Secondary | ICD-10-CM | POA: Insufficient documentation

## 2014-08-14 DIAGNOSIS — I10 Essential (primary) hypertension: Secondary | ICD-10-CM | POA: Insufficient documentation

## 2014-08-14 DIAGNOSIS — I429 Cardiomyopathy, unspecified: Secondary | ICD-10-CM | POA: Insufficient documentation

## 2014-08-14 DIAGNOSIS — Z7982 Long term (current) use of aspirin: Secondary | ICD-10-CM | POA: Insufficient documentation

## 2014-08-14 DIAGNOSIS — R942 Abnormal results of pulmonary function studies: Secondary | ICD-10-CM | POA: Insufficient documentation

## 2014-08-14 DIAGNOSIS — Z79899 Other long term (current) drug therapy: Secondary | ICD-10-CM | POA: Insufficient documentation

## 2014-08-14 DIAGNOSIS — Q210 Ventricular septal defect: Secondary | ICD-10-CM

## 2014-08-14 DIAGNOSIS — Z9989 Dependence on other enabling machines and devices: Secondary | ICD-10-CM

## 2014-08-14 DIAGNOSIS — I370 Nonrheumatic pulmonary valve stenosis: Secondary | ICD-10-CM | POA: Insufficient documentation

## 2014-08-14 LAB — BASIC METABOLIC PANEL
Anion gap: 8 (ref 5–15)
BUN: 13 mg/dL (ref 6–20)
CO2: 26 mmol/L (ref 22–32)
CREATININE: 0.93 mg/dL (ref 0.61–1.24)
Calcium: 9.3 mg/dL (ref 8.9–10.3)
Chloride: 106 mmol/L (ref 101–111)
GFR calc non Af Amer: >60 mL/min (ref >60)
Glucose, Bld: 74 mg/dL (ref 70–99)
POTASSIUM: 4 mmol/L (ref 3.5–5.1)
Sodium: 140 mmol/L (ref 135–145)

## 2014-08-14 MED ORDER — DIGOXIN 125 MCG PO TABS: 0.1250 mg | ORAL_TABLET | Freq: Every day | ORAL | Status: DC

## 2014-08-14 NOTE — Progress Notes (Signed)
Patient ID: Kevin Cardenas, male   DOB: July 27, 1975, 39 y.o.   MRN: [id number] PCP: Dr. Ronnald Ramp  Kevin Cardenas is a 39 y.o. gentlemen with history of VSD repair and removal of a pulmonary artery band at age 39, DM, HTN, systolic heart failure, NICM and OSA with CPAP.   Cardiac cath in April 2009 showed His LVEF was 35-40%. Coronary arteries were normal. RA mean 22 mmHg, RV 62/25, PA 59/39 mean 47 mmHg, mean pulmonary capillary wedge pressure 37 mmHg. Cardiac output was 6.21 with a cardiac index of 2.85 by FICK, and 4.95 and 2.27 respectively by thermodilution. There was no shunting at the atrial or ventricular level.   He returns for follow up. Stable symptomatically.  Dyspnea with walking 2-3 blocks. No orthopnea, PND, palpitations, or lightheadedness.  Using CPAP at night.  Taking all medications. Currently not working. Lives with his Mom and wife. Does smoke or drink alcohol.  He had RHC done since last appointment.  This showed low CI by Fick but preserved by Thermodilution.  Filling pressures were near normal.   Echo 9/11: LVEF 20-25%  Echo November 1st, 2012: LVEF 30-35%, diffuse hypokinesis and mild LVH. RV systolic mildly reduced. LA mildly dilated. Pulmonic valve with mild stenosis.  Echo 05/12/2014: EF 15-20% Grade IDD, LV moderately dilated.  RV normal. Mild pulmonc stenosis mean peak gradient 14.   CPX (3/16): RER 1.03, peak VO2 19.9, VE/VCO2 slope 18, mild to moderately impaired functional capacity.  There was moderate to severe mixed restrictive/obstructive pattern on spirometry => exertional hypoxemia, oxygen saturation decreased to 84%.   RHC (4/16): mean RA 9, PA 35/12 mean 22, mean PCWP 8, peak-peak gradient PV 7 mmHg, CI 1.77 Fick/2.44 Thermo, no evidence for significant left to right shunt.   Labs (1/16): K 4.1, creatinine 0.9, TSH normal, LDL 89, HDL 46 Labs (05/24/2014): K 4.1 Creatinine 0.97  Labs (3/16): BNP 54, K 4.4, creatinine 1.0 Labs (4/16): K 3.9, creatinine 0.93, HCT  44.6  ROS: All systems negative except as listed in HPI, PMH and Problem List.  Past Medical History  Diagnosis Date  . Diabetes mellitus   . Hypertension   . VSD (ventricular septal defect)   . Non-ischemic cardiomyopathy   . Obstructive sleep apnea   . CHF (congestive heart failure)     Systolic, EF 59% per echo 11/12    Current Outpatient Prescriptions  Medication Sig Dispense Refill  . aspirin 81 MG chewable tablet Chew 1 tablet (81 mg total) by mouth daily. 30 tablet 0  . carvedilol (COREG) 6.25 MG tablet Take 2 tablets (12.5 mg total) by mouth 2 (two) times daily with a meal. 60 tablet 5  . furosemide (LASIX) 20 MG tablet Take 1 tablet (20 mg total) by mouth 2 (two) times daily. 60 tablet 5  . sacubitril-valsartan (ENTRESTO) 49-51 MG Take 1 tablet by mouth 2 (two) times daily. 60 tablet 3  . simvastatin (ZOCOR) 20 MG tablet Take 1 tablet (20 mg total) by mouth at bedtime. 30 tablet 5  . spironolactone (ALDACTONE) 25 MG tablet Take 1 tablet (25 mg total) by mouth daily. 30 tablet 5  . digoxin (LANOXIN) 0.125 MG tablet Take 1 tablet (0.125 mg total) by mouth daily. 30 tablet 2   No current facility-administered medications for this encounter.    PHYSICAL EXAM: Filed Vitals:   08/14/14 1507  BP: 110/86  Pulse: 79  Weight: 203 lb 1.9 oz (92.135 kg)  SpO2: 99%    General: Well appearing.  No respiratory difficulty  HEENT: normal  Neck: Thick. JVP 7. Carotids 2+ bilat; no bruits. No lymphadenopathy or thyromegaly appreciated.  Cor: PMI nondisplaced. Regular rate & rhythm with widely split S2. No rubs, gallops.  2/6 SEM LUSB.  Lungs: End expiratory wheezes Abdomen: soft, nontender, nondistended. No hepatosplenomegaly. No bruits or masses. Good bowel sounds.  Extremities: no cyanosis, clubbing, rash, edema  Neuro: alert & oriented x 3, cranial nerves grossly intact. moves all 4 extremities w/o difficulty. Affect pleasant.   ASSESSMENT & PLAN: 1. Chronic systolic CHF:  Nonischemic cardiomyopathy.  Last echo 04/2014 EF 15-20% RV normal.  NYHA class II symptoms.  Euvolemic on exam. CPX in 3/16 showed a mild to moderate functional limitation, but PFTs were quite abnormal with a mixed restrictive/obstructive picture. There was exertional hypoxemia that could not be reproduced in the office.  Lung abnormalities raised some concern for the possibility of pulmonary hypertension.  RHC in 4/16 showed no pulmonary hypertension and near normal filling pressures.  Cardiac output was very low by Fick but more preserved by thermodilution.  Given his mild symptoms, the thermodilution cardiac output may be more accurate.  - Volume status stable. Continue lasix 20 mg twice a day + 25 mg spironolactone daily - Continue 12.5 mg carvedilol twice a day. - Continue Entresto 49/51 twice a day.   - I will add digoxin 0.125 mg daily (low CO by Fick).  - Repeat echo in 7/16.  If EF remains low, arrange for ICD.  Last QRS 128 msec, probably not much benefit to CRT.  - BMET today.  2. H/o VSD: No residual shunt noted on last echo.  Recent RHC also did not suggest any significant left to right shunting.  3. Pulmonary stenosis: Very mild by last echo in 1/16 and mild by peak-peak gradient across valve on recent RHC.  4. OSA: Continue CPAP.  I have referred to sleep medicine for evaluation/adjustment, has been a long time since this has been evaluated.  5. Abnormal PFTs: I would like him to have a formal pulmonary evaluation, I have referred.    Kevin Cardenas 08/14/2014

## 2014-08-14 NOTE — Patient Instructions (Signed)
START digoxin 0.125 mg, one tab daily  LABS today  Your physician recommends that you schedule a follow-up appointment in: 6 weeks with labs (BMET)  Do the following things EVERYDAY: 1) Weigh yourself in the morning before breakfast. Write it down and keep it in a log. 2) Take your medicines as prescribed 3) Eat low salt foods-Limit salt (sodium) to 2000 mg per day.  4) Stay as active as you can everyday 5) Limit all fluids for the day to less than 2 liters 6)

## 2014-08-18 ENCOUNTER — Encounter (HOSPITAL_COMMUNITY): Payer: Self-pay

## 2014-09-05 ENCOUNTER — Encounter (HOSPITAL_COMMUNITY): Payer: Self-pay | Admitting: Cardiology

## 2014-09-25 ENCOUNTER — Encounter (HOSPITAL_COMMUNITY): Payer: Self-pay

## 2014-09-25 ENCOUNTER — Ambulatory Visit (HOSPITAL_COMMUNITY)
Admission: RE | Admit: 2014-09-25 | Discharge: 2014-09-25 | Disposition: A | Discharge status: Home / Self Care | Payer: Self-pay | Source: Ambulatory Visit | Attending: Internal Medicine | Admitting: Internal Medicine

## 2014-09-25 VITALS — BP 112/70 | HR 88 | Wt 212.8 lb

## 2014-09-25 DIAGNOSIS — Z7982 Long term (current) use of aspirin: Secondary | ICD-10-CM | POA: Insufficient documentation

## 2014-09-25 DIAGNOSIS — I10 Essential (primary) hypertension: Secondary | ICD-10-CM

## 2014-09-25 DIAGNOSIS — R942 Abnormal results of pulmonary function studies: Secondary | ICD-10-CM | POA: Insufficient documentation

## 2014-09-25 DIAGNOSIS — I5022 Chronic systolic (congestive) heart failure: Secondary | ICD-10-CM

## 2014-09-25 DIAGNOSIS — I370 Nonrheumatic pulmonary valve stenosis: Secondary | ICD-10-CM | POA: Insufficient documentation

## 2014-09-25 DIAGNOSIS — G4733 Obstructive sleep apnea (adult) (pediatric): Secondary | ICD-10-CM | POA: Insufficient documentation

## 2014-09-25 DIAGNOSIS — E119 Type 2 diabetes mellitus without complications: Secondary | ICD-10-CM | POA: Insufficient documentation

## 2014-09-25 DIAGNOSIS — Z8774 Personal history of (corrected) congenital malformations of heart and circulatory system: Secondary | ICD-10-CM | POA: Insufficient documentation

## 2014-09-25 DIAGNOSIS — Z79899 Other long term (current) drug therapy: Secondary | ICD-10-CM | POA: Insufficient documentation

## 2014-09-25 DIAGNOSIS — I429 Cardiomyopathy, unspecified: Secondary | ICD-10-CM | POA: Insufficient documentation

## 2014-09-25 LAB — DIGOXIN LEVEL: Digoxin Level: <0.2 ng/mL — ABNORMAL LOW (ref 0.8–2.0)

## 2014-09-25 MED ORDER — FUROSEMIDE 20 MG PO TABS
40.0000 mg | ORAL_TABLET | Freq: Two times a day (BID) | ORAL | Status: DC
Start: 2014-09-25 — End: 2014-10-19

## 2014-09-25 NOTE — Progress Notes (Signed)
Patient ID: Kevin Cardenas, male   DOB: 01-Nov-1975, 39 y.o.   MRN: [id number] PCP: Dr. Ronnald Ramp  [name] is a 39 y.o. gentlemen with history of VSD repair and removal of a pulmonary artery band at age 3, DM, HTN, systolic heart failure, NICM and OSA with CPAP.   Cardiac cath in April 2009 showed His LVEF was 35-40%. Coronary arteries were normal. RA mean 22 mmHg, RV 62/25, PA 59/39 mean 47 mmHg, mean pulmonary capillary wedge pressure 37 mmHg. Cardiac output was 6.21 with a cardiac index of 2.85 by FICK, and 4.95 and 2.27 respectively by thermodilution. There was no shunting at the atrial or ventricular level.   He returns for follow up. Denies SOB/PND/Orthopnea. Does admit after walking a few minutes he has mild dyspnea. Weight at home 198-200 pounds. Currently not working. Lives with his Mom and wife. Does smoke or drink alcohol.  Drinking > 2 liters per day.   Echo 9/11: LVEF 20-25%  Echo November 1st, 2012: LVEF 30-35%, diffuse hypokinesis and mild LVH. RV systolic mildly reduced. LA mildly dilated. Pulmonic valve with mild stenosis.  Echo 05/12/2014: EF 15-20% Grade IDD, LV moderately dilated.  RV normal. Mild pulmonc stenosis mean peak gradient 14.   CPX (3/16): RER 1.03, peak VO2 19.9, VE/VCO2 slope 18, mild to moderately impaired functional capacity.  There was moderate to severe mixed restrictive/obstructive pattern on spirometry => exertional hypoxemia, oxygen saturation decreased to 84%.   Kevin Cardenas 08/03/2014-  RA mean 9 RV 42/9 PA 35/12, mean 22 PCWP mean 8 Peak to peak gradient across pulmonic valve 7 mmHg, mean gradient 6 mmHg.  Oxygen saturations: RA 56% RV 60% PA 61% AO 93% Cardiac Output (Fick) 3.44  Cardiac Index (Fick) 1.77 Cardiac Output (Thermo) 4.73 Cardiac Index (Thermo) 2.44  Labs (1/16): K 4.1, creatinine 0.9, TSH normal, LDL 89, HDL 46 Labs (05/24/2014): K 4.1 Creatinine 0.97  Labs (3/16): BNP 54, K 4.4, creatinine 1.0 Labs 08/14/2014: K 4.0 Creatinine 0.93      ROS: All systems negative except as listed in HPI, PMH and Problem List.  Past Medical History  Diagnosis Date  . Diabetes mellitus   . Hypertension   . VSD (ventricular septal defect)   . Non-ischemic cardiomyopathy   . Obstructive sleep apnea   . CHF (congestive heart failure)     Systolic, EF 58% per echo 11/12    Current Outpatient Prescriptions  Medication Sig Dispense Refill  . aspirin 81 MG chewable tablet Chew 1 tablet (81 mg total) by mouth daily. 30 tablet 0  . carvedilol (COREG) 6.25 MG tablet Take 2 tablets (12.5 mg total) by mouth 2 (two) times daily with a meal. 60 tablet 5  . digoxin (LANOXIN) 0.125 MG tablet Take 1 tablet (0.125 mg total) by mouth daily. 30 tablet 2  . furosemide (LASIX) 20 MG tablet Take 1 tablet (20 mg total) by mouth 2 (two) times daily. 60 tablet 5  . sacubitril-valsartan (ENTRESTO) 49-51 MG Take 1 tablet by mouth 2 (two) times daily. 60 tablet 3  . simvastatin (ZOCOR) 20 MG tablet Take 1 tablet (20 mg total) by mouth at bedtime. 30 tablet 5  . spironolactone (ALDACTONE) 25 MG tablet Take 1 tablet (25 mg total) by mouth daily. 30 tablet 5   No current facility-administered medications for this encounter.    PHYSICAL EXAM: Filed Vitals:   09/25/14 1355  BP: 112/70  Pulse: 88  Weight: 212 lb 12 oz (96.503 kg)  SpO2: 98%  General: Well appearing. No respiratory difficulty  HEENT: normal  Neck: Thick. JVP to jaw  Carotids 2+ bilat; no bruits. No lymphadenopathy or thyromegaly appreciated.  Cor: PMI nondisplaced. Regular rate & rhythm with widely split S2. No rubs, gallops.  2/6 SEM LUSB.  Lungs: Clear Abdomen: soft, nontender, + distended. No hepatosplenomegaly. No bruits or masses. Good bowel sounds.  Extremities: no cyanosis, clubbing, rash, R and LLE 1+ edema   Neuro: alert & oriented x 3, cranial nerves grossly intact. moves all 4 extremities w/o difficulty. Affect pleasant.   ASSESSMENT & PLAN: 1. Chronic systolic CHF:  Nonischemic cardiomyopathy.  Last echo 04/2014 EF 15-20% RV normal.  CPX in 3/16 showed a mild to moderate functional limitation, but PFTs were quite abnormal with a mixed restrictive/obstructive picture. There was exertional hypoxemia that could not be reproduced today.  Lung abnormalities raise some concern for the possibility of pulmonary hypertension.   -NYHA II. Volume status elevated.   Increase lasix to 40 mg twice a day.  Continue  25 mg spironolactone daily -Continue 12.5 mg carvedilol twice a day. Will not increase due to increased volume status.  - Refill Entresto 49/11m  twice a day.   - Repeat echo in 7/16.  If EF remains low, arrange for ICD.  Last QRS 128 msec, probably not much benefit to CRT.  2. H/o VSD: No residual shunt noted on last echo.  Last RHC also did not suggest any significant left to right shunting.  3. Pulmonary stenosis: Very mild by last echo in 1/16.  4. OSA: Continue CPAP.  Refer to pulmonary for sleep study.   5. Abnormal PFTs: Referred to pulmonary .     Follow up in 4 weeks with an ECHO. Refer to paramedicine.  CLEGG,AMY NP-C  09/25/2014   Patient seen and examined with Kevin Grinder NP. We discussed all aspects of the encounter. I agree with the assessment and plan as stated above.   He is volume overloaded on exam. Agree with increasing lasix. Reinforced need for daily weights and reviewed use of sliding scale diuretics. Will need repeat echo next month and if EF remains low will need ICD. CPX shows mainly pulmonary limitation and has been referred to pulmonary. Will need 6MW and assessment for home O2.   Kevin Raske,MD 6:27 PM

## 2014-09-25 NOTE — Patient Instructions (Signed)
LABS today (dig level)  INCREASE LASIX to 61m (2 tablets) twice a day.  FOLLOW UP in 4 weeks with an ECHO.

## 2014-09-25 NOTE — Progress Notes (Signed)
Patient referred to [facility]F Paramedicine Program through [facility] for weekly visits to manage medicine, diet, and fluid compliance per Darrick Grinder NP-C. Referral form faxed to [phone] along with demographic sheet and today's office visit note. Copy of referral form scanned into electronic medical records, original copy given to HF Navigator [place].  Renee Pain

## 2014-09-27 ENCOUNTER — Encounter: Payer: Self-pay | Admitting: Pulmonary Disease

## 2014-09-27 ENCOUNTER — Ambulatory Visit (INDEPENDENT_AMBULATORY_CARE_PROVIDER_SITE_OTHER): Payer: Self-pay | Admitting: Pulmonary Disease

## 2014-09-27 VITALS — BP 112/80 | HR 98 | Ht 63.0 in | Wt 207.2 lb

## 2014-09-27 DIAGNOSIS — G4733 Obstructive sleep apnea (adult) (pediatric): Secondary | ICD-10-CM

## 2014-09-27 DIAGNOSIS — R0600 Dyspnea, unspecified: Secondary | ICD-10-CM

## 2014-09-27 NOTE — Progress Notes (Deleted)
Subjective:    Patient ID: Kevin Cardenas, male    DOB: 19-Feb-1976, 38 y.o.   MRN: [id number]  HPI    Review of Systems  Constitutional: Negative for fever and unexpected weight change.  HENT: Negative for congestion, dental problem, ear pain, nosebleeds, postnasal drip, rhinorrhea, sinus pressure, sneezing, sore throat and trouble swallowing.   Eyes: Negative for redness and itching.  Respiratory: Positive for shortness of breath. Negative for cough, chest tightness and wheezing.   Cardiovascular: Positive for leg swelling ( takes Lasix). Negative for palpitations.  Gastrointestinal: Negative for nausea and vomiting.  Genitourinary: Negative for dysuria.  Musculoskeletal: Negative for joint swelling.  Skin: Negative for rash.  Neurological: Negative for headaches.  Hematological: Does not bruise/bleed easily.  Psychiatric/Behavioral: Negative for dysphoric mood. The patient is not nervous/anxious.        Objective:   Physical Exam        Assessment & Plan:

## 2014-09-27 NOTE — Patient Instructions (Signed)
Will schedule sleep study Follow up in 4 months

## 2014-09-27 NOTE — Progress Notes (Signed)
Chief Complaint  Patient presents with  . SLEEP CONSULT    Referred by Dr Aundra Dubin. Sleep study 2010. Uses CPAP nightly. Epworth Score: 8    History of Present Illness: Kevin Cardenas is a 39 y.o. male former smoker for evaluation of sleep problems and dyspnea.  He has been followed by cardiology for systolic heart failure.  He has history of sleep apnea and has been on CPAP for at least 7 or 8 years.  He thinks his last sleep study was from 2009 or 2010.  He has same machine/mask/tube since original set up.  He is not sure how much CPAP is helping.  He still snores when he uses CPAP.  His mouth will get dry and he has to take his mask off.  He uses CPAP for about 5 or 6 hours per night.  He goes to sleep at 11 pm.  He falls asleep after 15 minutes.  He wakes up 2 to 3 times to use the bathroom.  He gets out of bed at 7 am.  He feels okay in the morning.  He denies morning headache.  He does not use anything to help him fall sleep or stay awake.  He denies sleep walking, sleep talking, bruxism, or nightmares.  There is no history of restless legs.  He denies sleep hallucinations, sleep paralysis, or cataplexy.  The Epworth score is 8 out of 24.  He has history of smoking.  He would smoke 1 pack in a week.  He quit in 2007.  He denies cough, wheeze, sputum, or chest pain.  He had pneumonia in 2011.  There is no hx of exposure to tuberculosis.  There is no hx of asthma.  He was having leg swelling >> this improved after his lasix was changed.  He feels his breathing is better after changing lasix also.  He currently doesn't feel like his breathing is as much of an issue.  Spirometry on CPST from March showed suggestion of restrictive defect.  Tests: Echo 05/12/14 >> EF 15 to 46%, grade 1 diastolic dyfsx CPST 11/15/19 >> FEV1 1.40 (40%), FEV1% 69, SpO2 low 84% Rt heart cath 08/03/14 >> RA 9, RV 42/9, PA 35/12, PAOP 8, CI 3.44  Kevin Cardenas  has a past medical history of Diabetes mellitus;  Hypertension; VSD (ventricular septal defect); Non-ischemic cardiomyopathy; Obstructive sleep apnea; and CHF (congestive heart failure).  Kevin Cardenas  has past surgical history that includes VSD repair and right heart catheterization (N/A, 08/03/2014).  Prior to Admission medications   Medication Sig Start Date End Date Taking? Authorizing Provider  aspirin 81 MG chewable tablet Chew 1 tablet (81 mg total) by mouth daily. 04/11/12  Yes Ripley Fraise, MD  carvedilol (COREG) 6.25 MG tablet Take 2 tablets (12.5 mg total) by mouth 2 (two) times daily with a meal. 04/17/14  Yes [name], MD  digoxin (LANOXIN) 0.125 MG tablet Take 1 tablet (0.125 mg total) by mouth daily. 08/14/14  Yes Larey Dresser, MD  furosemide (LASIX) 20 MG tablet Take 2 tablets (40 mg total) by mouth 2 (two) times daily. 09/25/14  Yes Amy D Clegg, NP  sacubitril-valsartan (ENTRESTO) 49-51 MG Take 1 tablet by mouth 2 (two) times daily. 07/28/14  Yes Larey Dresser, MD  simvastatin (ZOCOR) 20 MG tablet Take 1 tablet (20 mg total) by mouth at bedtime. 04/17/14  Yes [name], MD  spironolactone (ALDACTONE) 25 MG tablet Take 1 tablet (25 mg total) by  mouth daily. 04/17/14  Yes [name], MD    No Known Allergies  His family history includes Diabetes in his maternal grandfather and paternal grandmother; Lung cancer in his father; Stroke in his father and mother. There is no history of Cancer, Alcohol abuse, Drug abuse, Early death, Heart disease, Hyperlipidemia, Hypertension, or Kidney disease.  He  reports that he quit smoking about 3 years ago. His smoking use included Cigarettes. He has a 4.5 pack-year smoking history. He has never used smokeless tobacco. He reports that he does not drink alcohol or use illicit drugs.  Review of Systems  Constitutional: Negative for fever and unexpected weight change.  HENT: Negative for congestion, dental problem, ear pain, nosebleeds, postnasal drip, rhinorrhea, sinus pressure,  sneezing, sore throat and trouble swallowing.   Eyes: Negative for redness and itching.  Respiratory: Positive for shortness of breath. Negative for cough, chest tightness and wheezing.   Cardiovascular: Positive for leg swelling ( takes Lasix). Negative for palpitations.  Gastrointestinal: Negative for nausea and vomiting.  Genitourinary: Negative for dysuria.  Musculoskeletal: Negative for joint swelling.  Skin: Negative for rash.  Neurological: Negative for headaches.  Hematological: Does not bruise/bleed easily.  Psychiatric/Behavioral: Negative for dysphoric mood. The patient is not nervous/anxious.    Physical Exam: Blood pressure 112/80, pulse 98, height _0  (1.6 m), weight 207 lb 3.2 oz (93.985 kg), SpO2 97 %. Body mass index is 36.71 kg/(m^2).  General - No distress ENT - No sinus tenderness, no oral exudate, no LAN, no thyromegaly, TM clear, pupils equal/reactive, MP 3 Cardiac - s1s2 regular, 2/6 systolic murmur, pulses symmetric Chest - No wheeze/rales/dullness, good air entry, normal respiratory excursion Back - No focal tenderness Abd - Soft, non-tender, no organomegaly, + bowel sounds Ext - No edema Neuro - Normal strength, cranial nerves intact Skin - No rashes, varicose veins Psych - Normal mood, and behavior  Discussion: He has history of sleep study.  His sleep study is from several years ago, and is not available.  He has been using same CPAP machine and supplies since original set up.  He likely still has sleep apnea, and this is in the setting of systolic heart failure, HTN, and DM.  He had suggestion of restrictive defect on spirometry from CPST in March 2016.  He does not feel like his breathing is as much of an issue after his lasix dose was adjusted.  He is worried about paying for medical tests prior to getting approval for disability and medicaid.  Assessment/plan:  Obstructive sleep apnea. Plan: - will arrange for in lab sleep study - assuming he  still has sleep apnea will then set him up with DME and get new CPAP and supplies  Dyspnea. Plan: - will need CXR and full PFT's to further assess  - he would like to defer testing until his disability/medicaid application is approved  Systolic heart failure. Plan: - per cardiology  Obesity. Plan: - discussed importance of weight loss   Chesley Mires, M.D. Pager [phone]

## 2014-10-19 ENCOUNTER — Other Ambulatory Visit (HOSPITAL_COMMUNITY): Payer: Self-pay | Admitting: *Deleted

## 2014-10-19 DIAGNOSIS — I10 Essential (primary) hypertension: Secondary | ICD-10-CM

## 2014-10-19 MED ORDER — FUROSEMIDE 40 MG PO TABS: 40.0000 mg | ORAL_TABLET | Freq: Two times a day (BID) | ORAL | Status: DC

## 2014-10-23 ENCOUNTER — Encounter (HOSPITAL_COMMUNITY): Payer: Self-pay

## 2014-10-23 ENCOUNTER — Ambulatory Visit (HOSPITAL_COMMUNITY)
Admission: RE | Admit: 2014-10-23 | Discharge: 2014-10-23 | Disposition: A | Discharge status: Home / Self Care | Payer: Self-pay | Source: Ambulatory Visit | Attending: Cardiology | Admitting: Cardiology

## 2014-10-23 ENCOUNTER — Ambulatory Visit (HOSPITAL_BASED_OUTPATIENT_CLINIC_OR_DEPARTMENT_OTHER)
Admission: RE | Admit: 2014-10-23 | Discharge: 2014-10-23 | Disposition: A | Discharge status: Home / Self Care | Payer: Self-pay | Source: Ambulatory Visit | Attending: Cardiology | Admitting: Cardiology

## 2014-10-23 VITALS — BP 102/66 | HR 61 | Wt 206.2 lb

## 2014-10-23 DIAGNOSIS — Z7982 Long term (current) use of aspirin: Secondary | ICD-10-CM | POA: Insufficient documentation

## 2014-10-23 DIAGNOSIS — Z8774 Personal history of (corrected) congenital malformations of heart and circulatory system: Secondary | ICD-10-CM | POA: Insufficient documentation

## 2014-10-23 DIAGNOSIS — I5022 Chronic systolic (congestive) heart failure: Secondary | ICD-10-CM

## 2014-10-23 DIAGNOSIS — I370 Nonrheumatic pulmonary valve stenosis: Secondary | ICD-10-CM | POA: Insufficient documentation

## 2014-10-23 DIAGNOSIS — I429 Cardiomyopathy, unspecified: Secondary | ICD-10-CM | POA: Insufficient documentation

## 2014-10-23 DIAGNOSIS — I10 Essential (primary) hypertension: Secondary | ICD-10-CM | POA: Insufficient documentation

## 2014-10-23 DIAGNOSIS — Z79899 Other long term (current) drug therapy: Secondary | ICD-10-CM | POA: Insufficient documentation

## 2014-10-23 DIAGNOSIS — I428 Other cardiomyopathies: Secondary | ICD-10-CM

## 2014-10-23 DIAGNOSIS — R001 Bradycardia, unspecified: Secondary | ICD-10-CM | POA: Insufficient documentation

## 2014-10-23 DIAGNOSIS — E119 Type 2 diabetes mellitus without complications: Secondary | ICD-10-CM | POA: Insufficient documentation

## 2014-10-23 DIAGNOSIS — I509 Heart failure, unspecified: Secondary | ICD-10-CM | POA: Insufficient documentation

## 2014-10-23 DIAGNOSIS — G4733 Obstructive sleep apnea (adult) (pediatric): Secondary | ICD-10-CM | POA: Insufficient documentation

## 2014-10-23 LAB — BRAIN NATRIURETIC PEPTIDE: B Natriuretic Peptide: 55.3 pg/mL (ref 0.0–100.0)

## 2014-10-23 LAB — BASIC METABOLIC PANEL
Anion gap: 6 (ref 5–15)
BUN: 11 mg/dL (ref 6–20)
CALCIUM: 8.9 mg/dL (ref 8.9–10.3)
CO2: 29 mmol/L (ref 22–32)
Chloride: 106 mmol/L (ref 101–111)
Creatinine, Ser: 1.05 mg/dL (ref 0.61–1.24)
GFR calc Af Amer: >60 mL/min (ref >60)
GFR calc non Af Amer: >60 mL/min (ref >60)
Glucose, Bld: 91 mg/dL (ref 65–99)
POTASSIUM: 4.1 mmol/L (ref 3.5–5.1)
Sodium: 141 mmol/L (ref 135–145)

## 2014-10-23 NOTE — Progress Notes (Signed)
  Echocardiogram 2D Echocardiogram has been performed.  Jennette Dubin 10/23/2014, 2:03 PM

## 2014-10-23 NOTE — Progress Notes (Signed)
Patient ID: ALDOUS HOUSEL, male   DOB: 1975/07/06, 39 y.o.   MRN: [id number] PCP: Dr. Ronnald Ramp  Mr. Salguero is a 39 y.o. gentlemen with history of VSD repair and removal of a pulmonary artery band at age 9, DM, HTN, systolic heart failure, NICM and OSA diagnosed in past but not on CPAP.   Cardiac cath in April 2009 showed His LVEF was 35-40%. Coronary arteries were normal. RA mean 22 mmHg, RV 62/25, PA 59/39 mean 47 mmHg, mean pulmonary capillary wedge pressure 37 mmHg. Cardiac output was 6.21 with a cardiac index of 2.85 by FICK, and 4.95 and 2.27 respectively by thermodilution. There was no shunting at the atrial or ventricular level.   He returns for follow up.  He is short of breath after walking about 75-100 yards or walking up a flight of steps.  Weight is down 6 lbs after increasing Lasix after last appointment.  No orthopnea/PND.  Snores loudly, sleep study scheduled.   Echo 9/11: LVEF 20-25%  Echo November 1st, 2012: LVEF 30-35%, diffuse hypokinesis and mild LVH. RV systolic mildly reduced. LA mildly dilated. Pulmonic valve with mild stenosis.  Echo 05/12/2014: EF 15-20% Grade IDD, LV moderately dilated.  RV normal. Mild pulmonc stenosis mean peak gradient 14.  Echo 7/16: EF 25-30%, mild LV dilation, mild LVH, trivial pulmonic stenosis with peak gradient 11 mmHg, mildly decreased RV function with mild RV dilation.    CPX (3/16): RER 1.03, peak VO2 19.9, VE/VCO2 slope 18, mild to moderately impaired functional capacity.  There was moderate to severe mixed restrictive/obstructive pattern on spirometry => exertional hypoxemia, oxygen saturation decreased to 84%.   RHC 08/03/2014  RA mean 9 RV 42/9 PA 35/12, mean 22 PCWP mean 8 Peak to peak gradient across pulmonic valve 7 mmHg, mean gradient 6 mmHg.  Oxygen saturations: RA 56% RV 60% PA 61% AO 93% Cardiac Output (Fick) 3.44  Cardiac Index (Fick) 1.77 Cardiac Output (Thermo) 4.73 Cardiac Index (Thermo) 2.44  Labs (1/16): K 4.1,  creatinine 0.9, TSH normal, LDL 89, HDL 46 Labs (05/24/2014): K 4.1 Creatinine 0.97  Labs (3/16): BNP 54, K 4.4, creatinine 1.0 Labs 08/14/2014: K 4.0 Creatinine 0.93  Labs (6/16): digoxin < 0.2  ECG: NSR, LVH with QRS widening (IVCD)  ROS: All systems negative except as listed in HPI, PMH and Problem List.  Past Medical History  Diagnosis Date  . Diabetes mellitus   . Hypertension   . VSD (ventricular septal defect)   . Non-ischemic cardiomyopathy   . Obstructive sleep apnea   . CHF (congestive heart failure)     Systolic, EF 17% per echo 11/12    Current Outpatient Prescriptions  Medication Sig Dispense Refill  . aspirin 81 MG chewable tablet Chew 1 tablet (81 mg total) by mouth daily. 30 tablet 0  . carvedilol (COREG) 6.25 MG tablet Take 2 tablets (12.5 mg total) by mouth 2 (two) times daily with a meal. 60 tablet 5  . digoxin (LANOXIN) 0.125 MG tablet Take 1 tablet (0.125 mg total) by mouth daily. 30 tablet 2  . furosemide (LASIX) 40 MG tablet Take 1 tablet (40 mg total) by mouth 2 (two) times daily. 60 tablet 2  . sacubitril-valsartan (ENTRESTO) 49-51 MG Take 1 tablet by mouth 2 (two) times daily. 60 tablet 3  . simvastatin (ZOCOR) 20 MG tablet Take 1 tablet (20 mg total) by mouth at bedtime. 30 tablet 5  . spironolactone (ALDACTONE) 25 MG tablet Take 1 tablet (25 mg total) by mouth  daily. 30 tablet 5   No current facility-administered medications for this encounter.    PHYSICAL EXAM: Filed Vitals:   10/23/14 1406  BP: 102/66  Pulse: 61  Weight: 206 lb 4 oz (93.554 kg)  SpO2: 98%    General: Well appearing. No respiratory difficulty  HEENT: normal  Neck: Thick. JVP not elevated. Carotids 2+ bilat; no bruits. No lymphadenopathy or thyromegaly appreciated.  Cor: PMI nondisplaced. Regular rate & rhythm with widely split S2. No rubs, gallops.  2/6 SEM LUSB.  Lungs: Clear Abdomen: soft, nontender, + distended. No hepatosplenomegaly. No bruits or masses. Good bowel sounds.   Extremities: no cyanosis, clubbing, rash, no edema.   Neuro: alert & oriented x 3, cranial nerves grossly intact. moves all 4 extremities w/o difficulty. Affect pleasant.   ASSESSMENT & PLAN: 1. Chronic systolic CHF: Nonischemic cardiomyopathy.  CPX in 3/16 showed a mild to moderate functional limitation, but PFTs were quite abnormal with a mixed restrictive/obstructive picture. RHC in 4/16 showed preserved cardiac index by thermodilution (low by Fick).   Echo today reviewed, EF 25-30% with mildly dilated and mildly dysfunctional RV. NYHA class II-III symptoms, volume status improved on increased Lasix and weight down 6 lbs.  - Continue Lasix 40 mg bid, BMET/BNP today.  - Continue  25 mg spironolactone daily - Continue 12.5 mg carvedilol twice a day and Entresto 49/51 bid.  - EF remains low on echo today.  Will refer for EP evaluation for ICD.  IVCD on ECG, probably would not benefit much from CRT but will defer to EP on this.  2. H/o VSD: No residual shunt noted on today's echo.  Last RHC also did not suggest any significant left to right shunting.  3. Pulmonary stenosis: Very mild by today's echo.  4. OSA: Plan for repeat sleep study, needs to go back on CPAP.    5. Abnormal PFTs: Pulmonary following.    Merwyn Hodapp,MD 10/23/2014

## 2014-10-23 NOTE — Patient Instructions (Signed)
Labs today  You have been referred to EP to discuss getting ICD (defibrillator)  We will contact you in 3 months to schedule your next appointment.

## 2014-11-07 ENCOUNTER — Telehealth (HOSPITAL_COMMUNITY): Payer: Self-pay | Admitting: Surgery

## 2014-11-07 NOTE — Telephone Encounter (Signed)
I received a call from Marylouise Stacks regarding [name].  She was visiting him this afternoon and his BP is 100/74.  Per Joellen Jersey he was concerned because he has been experiencing some dizziness while bending and when he exerts himself.  He weight is maintaining and he has no significant swelling- according to [facility].  I discussed with Darrick Grinder NP and after review of his medications and BP trends she recommends that he just move a bit slower and avoid bending as much as possible.  He has an appt with Dr Caryl Comes on Friday to evaluate for an ICD.  I have asked Katie to relay to him to call the clinic if his dizziness worsens.

## 2014-11-10 ENCOUNTER — Ambulatory Visit (INDEPENDENT_AMBULATORY_CARE_PROVIDER_SITE_OTHER): Payer: Self-pay | Admitting: Internal Medicine

## 2014-11-10 ENCOUNTER — Encounter: Payer: Self-pay | Admitting: Internal Medicine

## 2014-11-10 VITALS — BP 88/62 | HR 74 | Ht 66.0 in | Wt 201.8 lb

## 2014-11-10 DIAGNOSIS — I428 Other cardiomyopathies: Secondary | ICD-10-CM

## 2014-11-10 DIAGNOSIS — G4733 Obstructive sleep apnea (adult) (pediatric): Secondary | ICD-10-CM

## 2014-11-10 DIAGNOSIS — I429 Cardiomyopathy, unspecified: Secondary | ICD-10-CM

## 2014-11-10 DIAGNOSIS — Z9989 Dependence on other enabling machines and devices: Secondary | ICD-10-CM

## 2014-11-10 NOTE — Progress Notes (Signed)
ELECTROPHYSIOLOGY CONSULT NOTE  Patient ID: Kevin Cardenas, MRN: [id number], DOB/AGE: 07-28-1975 39 y.o. Admit date: (Not on file) Date of Consult: 11/10/2014  Primary Physician: [name], MD Primary Cardiologist: CHF  Chief Complaint: ICD?   HPI Kevin Cardenas is a 39 y.o. male seen for consideration of CRT-D implantation  He has a history of nonischemic heart disease. She is status post VSD repair at the age of 20 with pulmonary artery banding removal at that time. Ejection fraction 2009 was noted to be 3 5-40% with   RA pressure of 22 and RV pressure 62;  no shunting was noted. Over the last few years ejection fraction has remained in the 20-30% range. There has been mild decreased RV function. Most recent right heart catheterization 4/16 demonstrated an RA pressure of 9 a mean PA pressure 22 a cardiac output of 2.5--1.75 L  Echo Ef has remained in the 20-25% range for many months now. He has modest dyspnea on exertion and occasional peripheral edema. He has had some nocturnal dyspnea and orthopnea with 3 pillows.  He had an episode of syncope in 2009. This was preceded by tachypalpitations. His had recurrent episodes of tachypalpitations associated with lightheadedness. These occur 1-2 times per year.  Past Medical History  Diagnosis Date  . Diabetes mellitus   . Hypertension   . VSD (ventricular septal defect)   . Non-ischemic cardiomyopathy   . Obstructive sleep apnea   . CHF (congestive heart failure)     Systolic, EF 96% per echo 11/12      Surgical History:  Past Surgical History  Procedure Laterality Date  . Vsd repair    . Right heart catheterization N/A 08/03/2014    Procedure: RIGHT HEART CATH;  Surgeon: Larey Dresser, MD;  Location: [facility] CATH LAB;  Service: Cardiovascular;  Laterality: N/A;     Home Meds: Prior to Admission medications   Medication Sig Start Date End Date Taking? Authorizing Provider  aspirin 81 MG chewable tablet Chew 1 tablet (81 mg  total) by mouth daily. 04/11/12  Yes Ripley Fraise, MD  carvedilol (COREG) 6.25 MG tablet Take 2 tablets (12.5 mg total) by mouth 2 (two) times daily with a meal. 04/17/14  Yes [name], MD  digoxin (LANOXIN) 0.125 MG tablet Take 1 tablet (0.125 mg total) by mouth daily. 08/14/14  Yes Larey Dresser, MD  furosemide (LASIX) 40 MG tablet Take 1 tablet (40 mg total) by mouth 2 (two) times daily. 10/19/14  Yes Larey Dresser, MD  sacubitril-valsartan (ENTRESTO) 49-51 MG Take 1 tablet by mouth 2 (two) times daily. 07/28/14  Yes Larey Dresser, MD  simvastatin (ZOCOR) 20 MG tablet Take 1 tablet (20 mg total) by mouth at bedtime. 04/17/14  Yes [name], MD  spironolactone (ALDACTONE) 25 MG tablet Take 1 tablet (25 mg total) by mouth daily. 04/17/14  Yes [name], MD    I   Allergies: No Known Allergies  History   Social History  . Marital Status: Single    Spouse Name: Recently Married   . Number of Children: 0  . Years of Education: N/A   Occupational History  . [place] Employee      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    Social History Main Topics  . Smoking status: Former Smoker -- 0.30 packs/day for 15 years    Types: Cigarettes    Quit date: 04/08/2011  . Smokeless tobacco: Never Used  Comment: Smokes depends on who is around him.    . Alcohol Use: No     Comment: Rarely   . Drug Use: No  . Sexual Activity: Yes   Other Topics Concern  . Not on file   Social History Narrative   Caffienated drinks-no   Seat belt use often-no   Regular Exercise-   Smoke alarm in the home-yes   Firearms/guns in the home-no   History of physical abuse-no                 Family History  Problem Relation Age of Onset  . Lung cancer Father   . Stroke Father   . Diabetes Maternal Grandfather   . Diabetes Paternal Grandmother   . Stroke Mother   . Cancer Neg Hx   . Alcohol abuse Neg Hx   . Drug abuse Neg Hx   . Early death Neg Hx   . Heart disease Neg Hx     . Hyperlipidemia Neg Hx   . Hypertension Neg Hx   . Kidney disease Neg Hx      ROS:  Please see the history of present illness.     All other systems reviewed and negative.    Physical Exam:   Blood pressure 88/62, pulse 74, height _0  (1.676 m), weight 201 lb 12.8 oz (91.536 kg). General: Well developed, well nourished male in no acute distress. Head: Normocephalic, atraumatic, sclera non-icteric, no xanthomas, nares are without discharge. EENT: normal Lymph Nodes:  none Back: without scoliosis/kyphosis, no CVA tendersness Neck: Negative for carotid bruits. JVD not elevated. Lungs: Clear bilaterally to auscultation without wheezes, rales, or rhonchi. Breathing is unlabored. Heart: RRR with S1 S2.  2/6 systolic murmur , rubs, or gallops appreciated. Abdomen: Soft, non-tender, non-distended with normoactive bowel sounds. No hepatomegaly. No rebound/guarding. No obvious abdominal masses. Msk:  Strength and tone appear normal for age. Extremities: No clubbing or cyanosis. tr edema.  Distal pedal pulses are 2+ and equal bilaterally. Skin: Warm and Dry Neuro: Alert and oriented X 3. CN III-XII intact Grossly normal sensory and motor function . Psych:  Responds to questions appropriately with a normal affect.      Labs: Cardiac Enzymes No results for input(s): CKTOTAL, CKMB, TROPONINI in the last 72 hours. CBC Lab Results  Component Value Date   WBC 3.4* 08/03/2014   HGB 14.9 08/03/2014   HCT 44.6 08/03/2014   MCV 96.7 08/03/2014   PLT 139* 08/03/2014   PROTIME: No results for input(s): LABPROT, INR in the last 72 hours. Chemistry No results for input(s): NA, K, CL, CO2, BUN, CREATININE, CALCIUM, PROT, BILITOT, ALKPHOS, ALT, AST, GLUCOSE in the last 168 hours.  Invalid input(s): LABALBU Lipids Lab Results  Component Value Date   CHOL 151 04/17/2014   HDL 46.20 04/17/2014   LDLCALC 89 04/17/2014   TRIG 80.0 04/17/2014   BNP PRO B NATRIURETIC PEPTIDE (BNP)  Date/Time  Value Ref Range Status  01/30/2011 03:57 PM 68.0 0.0 - 100.0 pg/mL Final  12/31/2009 07:29 PM 168.0* 0.0 - 100.0 pg/mL Final  12/31/2009 02:23 PM 139.0* 0.0 - 100.0 pg/mL Final  07/23/2007 04:20 AM <30.0  Final   Thyroid Function Tests: No results for input(s): TSH, T4TOTAL, T3FREE, THYROIDAB in the last 72 hours.  Invalid input(s): FREET3    Miscellaneous No results found for: DDIMER  Radiology/Studies:  No results found.  EKG: NSR 74  17/15/40 With Q waves in lead 1 and L and a QRS in  lead V6  LAD    Assessment and Plan: Nonischemic cardiomyopathy  VSD repair  IVCD  Syncope  Tachypalpitations  Congestive heart failure-class II  Hypotension  The patient has persistent left ventricular dysfunction despite guidelines directed medical therapy. It is reasonable to consider ICD implantation for primary prevention. His IVCD makes it unlikely that he would benefit from CRT; hence, either standard ICD-transvenous or S ICD. We will discuss with the hospital cost considerations. He would like to be considered for S ICD if possible.  His syncopal episode associated with prodrome with palpitations and subsequent episodes of lightheadedness with tachypalpitations are worrisome for potential malignant ventricular arrhythmias and oriented this further highlights the potential benefit and importance of  ICD  Have reviewed the potential benefits and risks of ICD implantation including but not limited to death, perforation of heart or lung, lead dislodgement, infection,  device malfunction and inappropriate shocks.  The patient  *express understanding  and are willing to proceed.           Virl Axe

## 2014-11-22 ENCOUNTER — Other Ambulatory Visit (HOSPITAL_COMMUNITY): Payer: Self-pay | Admitting: Cardiology

## 2014-11-22 DIAGNOSIS — E785 Hyperlipidemia, unspecified: Secondary | ICD-10-CM

## 2014-11-22 DIAGNOSIS — E118 Type 2 diabetes mellitus with unspecified complications: Secondary | ICD-10-CM

## 2014-11-22 MED ORDER — SIMVASTATIN 20 MG PO TABS: 20.0000 mg | ORAL_TABLET | Freq: Every day | ORAL | Status: DC

## 2014-12-04 ENCOUNTER — Telehealth (HOSPITAL_COMMUNITY): Payer: Self-pay | Admitting: *Deleted

## 2014-12-04 NOTE — Telephone Encounter (Signed)
Joellen Jersey is out seeing pt and is a little concerned about his BP, she states it was 80/60 then 90/60 when she checked it today, pt states he is asymptomatic.  She states his BP usually runs 659-935 systolically.  She states he has been taking all of his medication as prescribed including Entresto 49/51 mg.  Since pt is asymptomatic she will see him later this week to recheck

## 2014-12-11 ENCOUNTER — Ambulatory Visit (HOSPITAL_BASED_OUTPATIENT_CLINIC_OR_DEPARTMENT_OTHER): Payer: Self-pay | Attending: Pulmonary Disease

## 2014-12-11 DIAGNOSIS — G4733 Obstructive sleep apnea (adult) (pediatric): Secondary | ICD-10-CM | POA: Insufficient documentation

## 2014-12-11 DIAGNOSIS — I493 Ventricular premature depolarization: Secondary | ICD-10-CM | POA: Insufficient documentation

## 2014-12-11 DIAGNOSIS — R0683 Snoring: Secondary | ICD-10-CM | POA: Insufficient documentation

## 2014-12-12 ENCOUNTER — Telehealth: Payer: Self-pay | Admitting: Pulmonary Disease

## 2014-12-12 DIAGNOSIS — G4733 Obstructive sleep apnea (adult) (pediatric): Secondary | ICD-10-CM

## 2014-12-12 NOTE — Progress Notes (Signed)
Patient Name: Kevin Cardenas, Kevin Cardenas Date: 12/11/2014 Gender: Male D.O.B: 1976-01-27 Age (years): 38 Referring Provider: Chesley Mires MD, ABSM Height (inches): 63 Interpreting Physician: Chesley Mires MD, ABSM Weight (lbs): 207 RPSGT: Joni Reining BMI: 75 MRN: [id number] Neck Size: 17.00  CLINICAL INFORMATION Sleep Study Type: NPSG  Indication for sleep study: He has a prior history of sleep apnea.  He also has history of non ischemic cardiomyopathy.  He continues to have snoring, sleep disruption and daytie sleepiness.  He presents to the sleep lab to assess for obstructive sleep apnea.  Epworth Sleepiness Score: 8.  SLEEP STUDY TECHNIQUE As per the AASM Manual for the Scoring of Sleep and Associated Events v2.3 (April 2016) with a hypopnea requiring 4% desaturations.  The channels recorded and monitored were frontal, central and occipital EEG, electrooculogram (EOG), submentalis EMG (chin), nasal and oral airflow, thoracic and abdominal wall motion, anterior tibialis EMG, snore microphone, electrocardiogram, and pulse oximetry.  MEDICATIONS Patient's medications include: reviewed in electronic medical record. Medications self-administered by patient during sleep study : No sleep medicine administered.  SLEEP ARCHITECTURE The study was initiated at 10:01:05 PM and ended at 4:53:07 AM.  Sleep onset time was 15.7 minutes and the sleep efficiency was 71.0%. The total sleep time was 292.3 minutes.  Stage REM latency was 278.0 minutes.  The patient spent 3.42% of the night in stage N1 sleep, 83.92% in stage N2 sleep, 0.00% in stage N3 and 12.66% in REM.  Alpha intrusion was absent.  Supine sleep was 30.73%.  RESPIRATORY PARAMETERS The overall apnea/hypopnea index (AHI) was 9.9 per hour. There were 27 total apneas, including 25 obstructive, 2 central and 0 mixed apneas. There were 21 hypopneas and 15 RERAs.  The AHI during Stage REM sleep was 6.5 per hour.  AHI while supine was  14.7 per hour.  The mean oxygen saturation was 93.04%. The minimum SpO2 during sleep was 84.00%.  Moderate snoring was noted during this study.  CARDIAC DATA The 2 lead EKG demonstrated sinus rhythm. The mean heart rate was N/A beats per minute. Other EKG findings include: PVCs.  LEG MOVEMENT DATA The total PLMS were 0 with a resulting PLMS index of 0.00. Associated arousal with leg movement index was 0.0 .  IMPRESSIONS Mild obstructive sleep apnea occurred during this study (AHI = 9.9/h). No significant central sleep apnea occurred during this study (CAI = 0.4/h).   Mild oxygen desaturation was noted during this study (Min O2 = 84.00%). The patient snored with Moderate snoring volume. EKG findings include PVCs. Clinically significant periodic limb movements did not occur during sleep. No significant associated arousals.  DIAGNOSIS Obstructive Sleep Apnea (327.23 [G47.33 ICD-10]).  RECOMMENDATIONS Additional therapies include weight loss, CPAP, oral appliance, or surgical assessment.   Chesley Mires, MD, [place], American Board of Sleep Medicine 12/12/2014, 9:58 AM NPI: [id number]

## 2014-12-12 NOTE — Telephone Encounter (Signed)
PSG 12/11/14 >> AHI 9.9, SaO2 low 84%.  Will have my nurse inform pt that sleep study shows mild sleep apnea.  Options are 1) CPAP now, 2) ROV first.  If He is agreeable to CPAP, then please send order for auto CPAP range 5 to 15 cm H2O with heated humidity and mask of choice.  Have download sent 1 month after starting CPAP and set up ROV 2 months after starting CPAP.

## 2014-12-13 NOTE — Telephone Encounter (Signed)
lmomtcb x1 

## 2014-12-21 NOTE — Telephone Encounter (Signed)
Attempted to call pt Unable to LVM 

## 2014-12-25 ENCOUNTER — Telehealth: Payer: Self-pay | Admitting: *Deleted

## 2014-12-25 DIAGNOSIS — I5022 Chronic systolic (congestive) heart failure: Secondary | ICD-10-CM

## 2014-12-25 DIAGNOSIS — Z01812 Encounter for preprocedural laboratory examination: Secondary | ICD-10-CM

## 2014-12-25 NOTE — Telephone Encounter (Signed)
lmtcb with patient's mother.  (when he calls back - discuss ICD/SICD implantation)

## 2014-12-26 ENCOUNTER — Encounter: Payer: Self-pay | Admitting: *Deleted

## 2014-12-26 NOTE — Telephone Encounter (Addendum)
Spoke with patient yesterday. We spoke last month and agreed to speak this month to arrange procedure. ICD scheduled for 10/07. Pre procedure labs 9/30. Wound check 10/17. Letter of instructions reviewed with patient and left at front desk for him to pick up next week. Patient verbalized understanding and agreeable to plan.    OV arranged, next week, for H&P prior to procedure.

## 2014-12-26 NOTE — Telephone Encounter (Signed)
Patient notified. Patient scheduled for ROV 03/15/2015 Order entered for CPAP. Nothing further needed.

## 2014-12-26 NOTE — Addendum Note (Signed)
Addended by: Stanton Kidney on: 12/26/2014 02:15 PM   Modules accepted: Orders

## 2015-01-04 ENCOUNTER — Ambulatory Visit (INDEPENDENT_AMBULATORY_CARE_PROVIDER_SITE_OTHER): Payer: Self-pay | Admitting: Internal Medicine

## 2015-01-04 ENCOUNTER — Encounter: Payer: Self-pay | Admitting: Internal Medicine

## 2015-01-04 VITALS — BP 114/72 | HR 87 | Ht 66.0 in | Wt 206.6 lb

## 2015-01-04 DIAGNOSIS — I5022 Chronic systolic (congestive) heart failure: Secondary | ICD-10-CM

## 2015-01-04 DIAGNOSIS — I428 Other cardiomyopathies: Secondary | ICD-10-CM

## 2015-01-04 DIAGNOSIS — I429 Cardiomyopathy, unspecified: Secondary | ICD-10-CM

## 2015-01-04 DIAGNOSIS — I454 Nonspecific intraventricular block: Secondary | ICD-10-CM

## 2015-01-04 NOTE — Progress Notes (Signed)
Patient Care Team: Larey Dresser, MD as PCP - General (Cardiology)   HPI  Kevin Cardenas is a 39 y.o. male   seen in anticipation for CRT-D implantation.  He has a history of nonischemic heart disease and is status post VSD repair at the age of 57 with pulmonary artery banding having been removed at that time. Ejection fraction 2009 was 35% or so ejection fraction most recently has remained in the 20-25% range. He has modest dyspnea on exertion some peripheral edema. His nocturnal dyspnea and orthopnea. He has recurrent episodes of tachypalpitations associated with presyncope.  His ECG previously had demonstrated Q waves in leads 1, L, and V6 rendering him into the category of IVCD and hence unlikely to benefit from CRT Records and Results Reviewed* hopsital records   Past Medical History  Diagnosis Date  . Diabetes mellitus   . Hypertension   . VSD (ventricular septal defect)   . Non-ischemic cardiomyopathy   . Obstructive sleep apnea   . CHF (congestive heart failure)     Systolic, EF 69% per echo 11/12    Past Surgical History  Procedure Laterality Date  . Vsd repair    . Right heart catheterization N/A 08/03/2014    Procedure: RIGHT HEART CATH;  Surgeon: Larey Dresser, MD;  Location: [facility] CATH LAB;  Service: Cardiovascular;  Laterality: N/A;    Current Outpatient Prescriptions  Medication Sig Dispense Refill  . aspirin 81 MG chewable tablet Chew 1 tablet (81 mg total) by mouth daily. 30 tablet 0  . carvedilol (COREG) 6.25 MG tablet Take 2 tablets (12.5 mg total) by mouth 2 (two) times daily with a meal. 60 tablet 5  . digoxin (LANOXIN) 0.125 MG tablet Take 1 tablet (0.125 mg total) by mouth daily. 30 tablet 2  . furosemide (LASIX) 40 MG tablet Take 1 tablet (40 mg total) by mouth 2 (two) times daily. 60 tablet 2  . sacubitril-valsartan (ENTRESTO) 49-51 MG Take 1 tablet by mouth 2 (two) times daily. 60 tablet 3  . simvastatin (ZOCOR) 20 MG tablet Take 1 tablet (20  mg total) by mouth at bedtime. 30 tablet 5  . spironolactone (ALDACTONE) 25 MG tablet Take 1 tablet (25 mg total) by mouth daily. 30 tablet 5   No current facility-administered medications for this visit.    No Known Allergies    Review of Systems negative except from HPI and PMH  Physical Exam Ht 5' 6" (1.676 m)  Wt 206 lb 9.6 oz (93.713 kg)  BMI 33.36 kg/m2 Well developed and well nourished in no acute distress HENT normal E scleral and icterus clear Neck Supple JVP flat; carotids brisk and full Clear to ausculation  Regular rate and rhythm, 2/6 systolic murmur Soft with active bowel sounds No clubbing cyanosis  Edema Alert and oriented, grossly normal motor and sensory function Skin Warm and Dry    Assessment and  Plan  Nonischemic cardiomyopathy  Congestive heart failure-class II!  History of VSD repair   Based on the recently published DANISH trial remains appropriate to consider ICD implantation for primary prevention in this younger man with class III symptoms.  Have reviewed the potential benefits and risks of ICD implantation including but not limited to death, perforation of heart or lung, lead dislodgement, infection,  device malfunction and inappropriate shocks.  The patient and family express understanding  and are willing to proceed.     We have discussed the relative benefits and merits of  transvenous versus subcutaneous ICD implantation.  The advantages of the former including battery longevity, history derived from use in randomized controlled trials and perhaps a somewhat lower rate of inappropriate ICD discharges. Advantages of the latter  include the fact that it is extravascular,  Resulting different implications of device infection and it being non-transvalvular.     He  has elected to pursue ICD transvenously as opposed to subcutaneously.

## 2015-01-12 ENCOUNTER — Other Ambulatory Visit (INDEPENDENT_AMBULATORY_CARE_PROVIDER_SITE_OTHER): Payer: Self-pay | Admitting: *Deleted

## 2015-01-12 DIAGNOSIS — I5022 Chronic systolic (congestive) heart failure: Secondary | ICD-10-CM

## 2015-01-12 DIAGNOSIS — Z01812 Encounter for preprocedural laboratory examination: Secondary | ICD-10-CM

## 2015-01-12 LAB — CBC WITH DIFFERENTIAL/PLATELET
BASOS PCT: 0.4 % (ref 0.0–3.0)
Basophils Absolute: 0 10*3/uL (ref 0.0–0.1)
EOS PCT: 1.9 % (ref 0.0–5.0)
Eosinophils Absolute: 0.1 10*3/uL (ref 0.0–0.7)
HCT: 40.4 % (ref 39.0–52.0)
HEMOGLOBIN: 13.5 g/dL (ref 13.0–17.0)
LYMPHS ABS: 1.3 10*3/uL (ref 0.7–4.0)
Lymphocytes Relative: 32 % (ref 12.0–46.0)
MCHC: 33.3 g/dL (ref 30.0–36.0)
MCV: 97.9 fl (ref 78.0–100.0)
MONO ABS: 0.5 10*3/uL (ref 0.1–1.0)
MONOS PCT: 11.7 % (ref 3.0–12.0)
NEUTROS PCT: 54 % (ref 43.0–77.0)
Neutro Abs: 2.1 10*3/uL (ref 1.4–7.7)
Platelets: 145 10*3/uL — ABNORMAL LOW (ref 150.0–400.0)
RBC: 4.13 Mil/uL — ABNORMAL LOW (ref 4.22–5.81)
RDW: 13.8 % (ref 11.5–15.5)
WBC: 3.9 10*3/uL — ABNORMAL LOW (ref 4.0–10.5)

## 2015-01-12 LAB — BASIC METABOLIC PANEL
BUN: 13 mg/dL (ref 6–23)
CO2: 26 mEq/L (ref 19–32)
Calcium: 8.7 mg/dL (ref 8.4–10.5)
Chloride: 106 mEq/L (ref 96–112)
Creatinine, Ser: 0.93 mg/dL (ref 0.40–1.50)
GFR: 115.73 mL/min (ref >60.00)
GLUCOSE: 103 mg/dL — AB (ref 70–99)
POTASSIUM: 3.7 meq/L (ref 3.5–5.1)
Sodium: 140 mEq/L (ref 135–145)

## 2015-01-13 LAB — DIGOXIN LEVEL: Digoxin Level: <0.5 ug/L — ABNORMAL LOW (ref 0.8–2.0)

## 2015-01-17 ENCOUNTER — Other Ambulatory Visit (HOSPITAL_COMMUNITY): Payer: Self-pay | Admitting: Adult Health

## 2015-01-19 ENCOUNTER — Ambulatory Visit (HOSPITAL_COMMUNITY)
Admission: RE | Admit: 2015-01-19 | Discharge: 2015-01-20 | Disposition: A | Discharge status: Home / Self Care | Payer: Medicaid Other | Source: Ambulatory Visit | Attending: Internal Medicine | Admitting: Internal Medicine

## 2015-01-19 ENCOUNTER — Encounter (HOSPITAL_COMMUNITY)
Admission: RE | Disposition: A | Discharge status: Home / Self Care | Payer: Self-pay | Source: Ambulatory Visit | Attending: Internal Medicine

## 2015-01-19 DIAGNOSIS — I5022 Chronic systolic (congestive) heart failure: Secondary | ICD-10-CM | POA: Diagnosis not present

## 2015-01-19 DIAGNOSIS — E785 Hyperlipidemia, unspecified: Secondary | ICD-10-CM | POA: Diagnosis not present

## 2015-01-19 DIAGNOSIS — I110 Hypertensive heart disease with heart failure: Secondary | ICD-10-CM | POA: Diagnosis not present

## 2015-01-19 DIAGNOSIS — E118 Type 2 diabetes mellitus with unspecified complications: Secondary | ICD-10-CM | POA: Diagnosis not present

## 2015-01-19 DIAGNOSIS — Z79899 Other long term (current) drug therapy: Secondary | ICD-10-CM | POA: Diagnosis not present

## 2015-01-19 DIAGNOSIS — G4733 Obstructive sleep apnea (adult) (pediatric): Secondary | ICD-10-CM | POA: Diagnosis present

## 2015-01-19 DIAGNOSIS — Q210 Ventricular septal defect: Secondary | ICD-10-CM

## 2015-01-19 DIAGNOSIS — Z7982 Long term (current) use of aspirin: Secondary | ICD-10-CM | POA: Diagnosis not present

## 2015-01-19 DIAGNOSIS — Z959 Presence of cardiac and vascular implant and graft, unspecified: Secondary | ICD-10-CM

## 2015-01-19 DIAGNOSIS — R7303 Prediabetes: Secondary | ICD-10-CM | POA: Diagnosis present

## 2015-01-19 DIAGNOSIS — I255 Ischemic cardiomyopathy: Secondary | ICD-10-CM

## 2015-01-19 DIAGNOSIS — I10 Essential (primary) hypertension: Secondary | ICD-10-CM | POA: Diagnosis present

## 2015-01-19 DIAGNOSIS — I454 Nonspecific intraventricular block: Secondary | ICD-10-CM

## 2015-01-19 DIAGNOSIS — Z9989 Dependence on other enabling machines and devices: Secondary | ICD-10-CM

## 2015-01-19 DIAGNOSIS — I428 Other cardiomyopathies: Secondary | ICD-10-CM

## 2015-01-19 HISTORY — PX: EP IMPLANTABLE DEVICE: SHX172B

## 2015-01-19 LAB — GLUCOSE, CAPILLARY
GLUCOSE-CAPILLARY: 127 mg/dL — AB (ref 65–99)
GLUCOSE-CAPILLARY: 91 mg/dL (ref 65–99)
Glucose-Capillary: 97 mg/dL (ref 65–99)

## 2015-01-19 LAB — SURGICAL PCR SCREEN
MRSA, PCR: NEGATIVE
Staphylococcus aureus: POSITIVE — AB

## 2015-01-19 SURGERY — ICD IMPLANT
Anesthesia: LOCAL

## 2015-01-19 MED ORDER — HEPARIN (PORCINE) IN NACL 2-0.9 UNIT/ML-% IJ SOLN
INTRAMUSCULAR | Status: DC | PRN
  Administered 2015-01-19: 14:00:00

## 2015-01-19 MED ORDER — FENTANYL CITRATE (PF) 100 MCG/2ML IJ SOLN
INTRAMUSCULAR | Status: AC
  Filled 2015-01-19: qty 4

## 2015-01-19 MED ORDER — MUPIROCIN 2 % EX OINT
TOPICAL_OINTMENT | Freq: Two times a day (BID) | CUTANEOUS | Status: DC
  Administered 2015-01-19 (×2): 1 via NASAL
  Administered 2015-01-20: 09:00:00 via NASAL
  Filled 2015-01-19 (×2): qty 22

## 2015-01-19 MED ORDER — ACETAMINOPHEN 325 MG PO TABS
325.0000 mg | ORAL_TABLET | ORAL | Status: DC | PRN
  Administered 2015-01-19 (×2): 650 mg via ORAL
  Filled 2015-01-19 (×2): qty 2

## 2015-01-19 MED ORDER — MIDAZOLAM HCL 5 MG/5ML IJ SOLN
INTRAMUSCULAR | Status: AC
  Filled 2015-01-19: qty 25

## 2015-01-19 MED ORDER — DIGOXIN 125 MCG PO TABS
125.0000 ug | ORAL_TABLET | Freq: Every day | ORAL | Status: DC
  Administered 2015-01-19 – 2015-01-20 (×2): 125 ug via ORAL
  Filled 2015-01-19 (×2): qty 1

## 2015-01-19 MED ORDER — CEFAZOLIN SODIUM 1-5 GM-% IV SOLN
1.0000 g | Freq: Four times a day (QID) | INTRAVENOUS | Status: AC
  Administered 2015-01-19 – 2015-01-20 (×3): 1 g via INTRAVENOUS
  Filled 2015-01-19 (×5): qty 50

## 2015-01-19 MED ORDER — SPIRONOLACTONE 25 MG PO TABS
25.0000 mg | ORAL_TABLET | Freq: Every day | ORAL | Status: DC
  Administered 2015-01-19 – 2015-01-20 (×2): 25 mg via ORAL
  Filled 2015-01-19 (×2): qty 1

## 2015-01-19 MED ORDER — FENTANYL CITRATE (PF) 100 MCG/2ML IJ SOLN
INTRAMUSCULAR | Status: DC | PRN
  Administered 2015-01-19: 25 ug via INTRAVENOUS
  Administered 2015-01-19: 50 ug via INTRAVENOUS

## 2015-01-19 MED ORDER — CEFAZOLIN SODIUM-DEXTROSE 2-3 GM-% IV SOLR
2.0000 g | INTRAVENOUS | Status: AC
  Administered 2015-01-19: 2 g via INTRAVENOUS

## 2015-01-19 MED ORDER — SODIUM CHLORIDE 0.9 % IV SOLN
INTRAVENOUS | Status: DC
  Administered 2015-01-19: 09:00:00 via INTRAVENOUS

## 2015-01-19 MED ORDER — CARVEDILOL 12.5 MG PO TABS
12.5000 mg | ORAL_TABLET | Freq: Two times a day (BID) | ORAL | Status: DC
  Administered 2015-01-19 – 2015-01-20 (×2): 12.5 mg via ORAL
  Filled 2015-01-19: qty 2
  Filled 2015-01-19: qty 1

## 2015-01-19 MED ORDER — MIDAZOLAM HCL 5 MG/5ML IJ SOLN
INTRAMUSCULAR | Status: DC | PRN
  Administered 2015-01-19: 1 mg via INTRAVENOUS
  Administered 2015-01-19: 2 mg via INTRAVENOUS

## 2015-01-19 MED ORDER — SACUBITRIL-VALSARTAN 49-51 MG PO TABS
1.0000 | ORAL_TABLET | Freq: Two times a day (BID) | ORAL | Status: DC
  Administered 2015-01-19 – 2015-01-20 (×2): 1 via ORAL
  Filled 2015-01-19 (×5): qty 1

## 2015-01-19 MED ORDER — FUROSEMIDE 10 MG/ML IJ SOLN
INTRAMUSCULAR | Status: AC
  Filled 2015-01-19: qty 4

## 2015-01-19 MED ORDER — SODIUM CHLORIDE 0.9 % IV SOLN: INTRAVENOUS | Status: AC

## 2015-01-19 MED ORDER — CHLORHEXIDINE GLUCONATE 4 % EX LIQD
60.0000 mL | Freq: Once | CUTANEOUS | Status: DC
  Filled 2015-01-19: qty 60

## 2015-01-19 MED ORDER — LIDOCAINE HCL (PF) 1 % IJ SOLN
INTRAMUSCULAR | Status: DC | PRN
  Administered 2015-01-19: 45 mL via INTRADERMAL

## 2015-01-19 MED ORDER — SODIUM CHLORIDE 0.9 % IR SOLN
80.0000 mg | Status: AC
  Administered 2015-01-19: 80 mg
  Filled 2015-01-19: qty 2

## 2015-01-19 MED ORDER — SIMVASTATIN 20 MG PO TABS
20.0000 mg | ORAL_TABLET | Freq: Every day | ORAL | Status: DC
  Administered 2015-01-19: 20 mg via ORAL
  Filled 2015-01-19: qty 1

## 2015-01-19 MED ORDER — CEFAZOLIN SODIUM-DEXTROSE 2-3 GM-% IV SOLR
INTRAVENOUS | Status: AC
  Filled 2015-01-19: qty 50

## 2015-01-19 MED ORDER — FUROSEMIDE 40 MG PO TABS
40.0000 mg | ORAL_TABLET | Freq: Two times a day (BID) | ORAL | Status: DC
  Administered 2015-01-19 – 2015-01-20 (×2): 40 mg via ORAL
  Filled 2015-01-19 (×2): qty 1

## 2015-01-19 MED ORDER — SODIUM CHLORIDE 0.9 % IR SOLN
Status: AC
  Filled 2015-01-19: qty 2

## 2015-01-19 MED ORDER — ASPIRIN 81 MG PO CHEW
81.0000 mg | CHEWABLE_TABLET | Freq: Every day | ORAL | Status: DC
  Administered 2015-01-19 – 2015-01-20 (×2): 81 mg via ORAL
  Filled 2015-01-19 (×2): qty 1

## 2015-01-19 MED ORDER — FUROSEMIDE 10 MG/ML IJ SOLN
INTRAMUSCULAR | Status: DC | PRN
  Administered 2015-01-19: 40 mg via INTRAVENOUS

## 2015-01-19 MED ORDER — LIDOCAINE HCL (PF) 1 % IJ SOLN
INTRAMUSCULAR | Status: AC
  Filled 2015-01-19: qty 60

## 2015-01-19 MED ORDER — ONDANSETRON HCL 4 MG/2ML IJ SOLN: 4.0000 mg | Freq: Four times a day (QID) | INTRAMUSCULAR | Status: DC | PRN

## 2015-01-19 MED ORDER — MUPIROCIN 2 % EX OINT
TOPICAL_OINTMENT | CUTANEOUS | Status: AC
  Administered 2015-01-19: 1 via NASAL
  Filled 2015-01-19: qty 22

## 2015-01-19 SURGICAL SUPPLY — 9 items
CABLE SURGICAL S-101-97-12 (CABLE) ×1 IMPLANT
HEMOSTAT SURGICEL 2X4 FIBR (HEMOSTASIS) ×1 IMPLANT
ICD FORTIFY ASSU VR CD1357-40C (ICD Generator) ×1 IMPLANT
LEAD ENDOTAK RELIANCE 0181 (Lead) ×1 IMPLANT
PAD DEFIB LIFELINK (PAD) ×1 IMPLANT
SHEATH CLASSIC 7F (SHEATH) IMPLANT
SHEATH CLASSIC 8F (SHEATH) ×1 IMPLANT
SHEATH CLASSIC 9F (SHEATH) ×1 IMPLANT
TRAY PACEMAKER INSERTION (CUSTOM PROCEDURE TRAY) ×1 IMPLANT

## 2015-01-19 NOTE — H&P (View-Only) (Signed)
Patient Care Team: Larey Dresser, MD as PCP - General (Cardiology)   HPI  Kevin Cardenas is a 39 y.o. male   seen in anticipation for CRT-D implantation.  He has a history of nonischemic heart disease and is status post VSD repair at the age of 19 with pulmonary artery banding having been removed at that time. Ejection fraction 2009 was 35% or so ejection fraction most recently has remained in the 20-25% range. He has modest dyspnea on exertion some peripheral edema. His nocturnal dyspnea and orthopnea. He has recurrent episodes of tachypalpitations associated with presyncope.  His ECG previously had demonstrated Q waves in leads 1, L, and V6 rendering him into the category of IVCD and hence unlikely to benefit from CRT Records and Results Reviewed* hopsital records   Past Medical History  Diagnosis Date  . Diabetes mellitus   . Hypertension   . VSD (ventricular septal defect)   . Non-ischemic cardiomyopathy   . Obstructive sleep apnea   . CHF (congestive heart failure)     Systolic, EF 40% per echo 11/12    Past Surgical History  Procedure Laterality Date  . Vsd repair    . Right heart catheterization N/A 08/03/2014    Procedure: RIGHT HEART CATH;  Surgeon: Larey Dresser, MD;  Location: [facility] CATH LAB;  Service: Cardiovascular;  Laterality: N/A;    Current Outpatient Prescriptions  Medication Sig Dispense Refill  . aspirin 81 MG chewable tablet Chew 1 tablet (81 mg total) by mouth daily. 30 tablet 0  . carvedilol (COREG) 6.25 MG tablet Take 2 tablets (12.5 mg total) by mouth 2 (two) times daily with a meal. 60 tablet 5  . digoxin (LANOXIN) 0.125 MG tablet Take 1 tablet (0.125 mg total) by mouth daily. 30 tablet 2  . furosemide (LASIX) 40 MG tablet Take 1 tablet (40 mg total) by mouth 2 (two) times daily. 60 tablet 2  . sacubitril-valsartan (ENTRESTO) 49-51 MG Take 1 tablet by mouth 2 (two) times daily. 60 tablet 3  . simvastatin (ZOCOR) 20 MG tablet Take 1 tablet (20  mg total) by mouth at bedtime. 30 tablet 5  . spironolactone (ALDACTONE) 25 MG tablet Take 1 tablet (25 mg total) by mouth daily. 30 tablet 5   No current facility-administered medications for this visit.    No Known Allergies    Review of Systems negative except from HPI and PMH  Physical Exam Ht 5' 6" (1.676 m)  Wt 206 lb 9.6 oz (93.713 kg)  BMI 33.36 kg/m2 Well developed and well nourished in no acute distress HENT normal E scleral and icterus clear Neck Supple JVP flat; carotids brisk and full Clear to ausculation  Regular rate and rhythm, 2/6 systolic murmur Soft with active bowel sounds No clubbing cyanosis  Edema Alert and oriented, grossly normal motor and sensory function Skin Warm and Dry    Assessment and  Plan  Nonischemic cardiomyopathy  Congestive heart failure-class II!  History of VSD repair   Based on the recently published DANISH trial remains appropriate to consider ICD implantation for primary prevention in this younger man with class III symptoms.  Have reviewed the potential benefits and risks of ICD implantation including but not limited to death, perforation of heart or lung, lead dislodgement, infection,  device malfunction and inappropriate shocks.  The patient and family express understanding  and are willing to proceed.     We have discussed the relative benefits and merits of  transvenous versus subcutaneous ICD implantation.  The advantages of the former including battery longevity, history derived from use in randomized controlled trials and perhaps a somewhat lower rate of inappropriate ICD discharges. Advantages of the latter  include the fact that it is extravascular,  Resulting different implications of device infection and it being non-transvalvular.     He  has elected to pursue ICD transvenously as opposed to subcutaneously.

## 2015-01-19 NOTE — Interval H&P Note (Signed)
History and Physical Interval Note:  01/19/2015 1:02 PM  Kevin Cardenas  has presented today for surgery, with the diagnosis of cardiomyopathy  The various methods of treatment have been discussed with the patient and family. After consideration of risks, benefits and other options for treatment, the patient has consented to  Procedure(s): ICD Implant (N/A) as a surgical intervention .  The patient's history has been reviewed, patient examined, no change in status, stable for surgery.  I have reviewed the patient's chart and labs.  Questions were answered to the patient's satisfaction.     Virl Axe  No interval change  .ICD Criteria  Current LVEF:25%. Within 12 months prior to implant: Yes   Heart failure history: Yes, Class II  Cardiomyopathy history: Yes, Non-Ischemic Cardiomyopathy.  Atrial Fibrillation/Atrial Flutter: No.  Ventricular tachycardia history: No.  Cardiac arrest history: No.  History of syndromes with risk of sudden death: No.  Previous ICD: No.  Current ICD indication: Primary  PPM indication: No.   Class I or II Bradycardia indication present: No  Beta Blocker therapy for 3 or more months: Yes, prescribed.   Ace Inhibitor/ARB therapy for 3 or more months: Yes, prescribed.

## 2015-01-20 ENCOUNTER — Encounter: Payer: Self-pay | Admitting: Nurse Practitioner

## 2015-01-20 ENCOUNTER — Ambulatory Visit (HOSPITAL_COMMUNITY): Payer: Medicaid Other

## 2015-01-20 DIAGNOSIS — I429 Cardiomyopathy, unspecified: Secondary | ICD-10-CM | POA: Diagnosis not present

## 2015-01-20 DIAGNOSIS — I428 Other cardiomyopathies: Secondary | ICD-10-CM | POA: Diagnosis not present

## 2015-01-20 DIAGNOSIS — I5022 Chronic systolic (congestive) heart failure: Secondary | ICD-10-CM | POA: Diagnosis not present

## 2015-01-20 DIAGNOSIS — I110 Hypertensive heart disease with heart failure: Secondary | ICD-10-CM | POA: Diagnosis not present

## 2015-01-20 DIAGNOSIS — E785 Hyperlipidemia, unspecified: Secondary | ICD-10-CM | POA: Diagnosis not present

## 2015-01-20 NOTE — Progress Notes (Signed)
Patient Name: Kevin Cardenas      SUBJECTIVE: wthoutcomplaint  Past Medical History  Diagnosis Date  . Diabetes mellitus   . Hypertension   . VSD (ventricular septal defect)   . Non-ischemic cardiomyopathy   . Obstructive sleep apnea   . CHF (congestive heart failure)     Systolic, EF 80% per echo 11/12    Scheduled Meds:  Scheduled Meds: . aspirin  81 mg Oral Daily  . carvedilol  12.5 mg Oral BID  . digoxin  125 mcg Oral Daily  . furosemide  40 mg Oral BID  . mupirocin ointment   Nasal BID  . sacubitril-valsartan  1 tablet Oral BID  . simvastatin  20 mg Oral QHS  . spironolactone  25 mg Oral Daily   Continuous Infusions:  acetaminophen, ondansetron (ZOFRAN) IV    PHYSICAL EXAM Filed Vitals:   01/19/15 1446 01/19/15 2122 01/19/15 2143 01/20/15 0520  BP:  120/76 120/78 138/101  Pulse: 0 78 84 74  Temp:   98.1 F (36.7 C) 97.7 F (36.5 C)  TempSrc:   Oral Oral  Resp: _0 Height:      Weight:    198 lb 4.8 oz (89.948 kg)  SpO2: 0%  97% 95%    Well developed and nourished in no acute distress HENT normal Neck supple with JVP-flat Clear Pocket without hematoma, swelling or tenderness  Regular rate and rhythm, no murmurs or gallops Abd-soft with active BS No Clubbing cyanosis edema Skin-warm and dry A & Oriented  Grossly normal sensory and motor function   TELEMETRY: Reviewed telemetry pt in nsr   Intake/Output Summary (Last 24 hours) at 01/20/15 0913 Last data filed at 01/20/15 0842  Gross per 24 hour  Intake    530 ml  Output   1575 ml  Net  -1045 ml    LABS: Basic Metabolic Panel: No results for input(s): NA, K, CL, CO2, GLUCOSE, BUN, CREATININE, CALCIUM, MG, PHOS in the last 168 hours. Cardiac Enzymes: No results for input(s): CKTOTAL, CKMB, CKMBINDEX, TROPONINI in the last 72 hours. CBC: No results for input(s): WBC, NEUTROABS, HGB, HCT, MCV, PLT in the last 168 hours. PROTIME: No results for input(s): LABPROT, INR in  the last 72 hours. Liver Function Tests: No results for input(s): AST, ALT, ALKPHOS, BILITOT, PROT, ALBUMIN in the last 72 hours. No results for input(s): LIPASE, AMYLASE in the last 72 hours. BNP: BNP (last 3 results)  Recent Labs  06/28/14 1217 10/23/14 1500  BNP 54.1 55.3    ProBNP (last 3 results) No results for input(s): PROBNP in the last 8760 hours.  D-Dimer: No results for input(s): DDIMER in the last 72 hours. Hemoglobin A1C: No results for input(s): HGBA1C in the last 72 hours. Fasting Lipid Panel: No results for input(s): CHOL, HDL, LDLCALC, TRIG, CHOLHDL, LDLDIRECT in the last 72 hours. Thyroid Function Tests: No results for input(s): TSH, T4TOTAL, T3FREE, THYROIDAB in the last 72 hours.  Invalid input(s): FREET3 Anemia Panel: No results for input(s): VITAMINB12, FOLATE, FERRITIN, TIBC, IRON, RETICCTPCT in the last 72 hours.   Device Interrogation normal device   ASSESSMENT AND PLAN:  Active Problems:   Non-ischemic cardiomyopathy (HCC)   Systolic CHF, chronic (HCC)   VSD (ventricular septal defect)   NICM (nonischemic cardiomyopathy) ([place])  Instructions given Signed, Virl Axe MD  01/20/2015

## 2015-01-20 NOTE — Progress Notes (Signed)
Pt discharged to home via [facility], condition stable, discharge instructions given with verbal understanding received.  Edward Qualia RN

## 2015-01-20 NOTE — Discharge Instructions (Signed)
Supplemental Discharge Instructions for  Pacemaker/Defibrillator Patients  Activity No heavy lifting or vigorous activity with your left/right arm for 6 to 8 weeks.  Do not raise your left/right arm above your head for one week.  Gradually raise your affected arm as drawn below.           __    10/11/6                     01/24/15               01/25/15                 01/26/15  NO DRIVING for 1 week    ; you may begin driving on  05/39/76   .  WOUND CARE - Keep the wound area clean and dry.  Do not get this area wet for one week. No showers for one week; you may shower on  01/26/15   . - The tape/steri-strips on your wound will fall off; do not pull them off.  No bandage is needed on the site.  DO  NOT apply any creams, oils, or ointments to the wound area. - If you notice any drainage or discharge from the wound, any swelling or bruising at the site, or you develop a fever > 101? F after you are discharged home, call the office at once.  Special Instructions - You are still able to use cellular telephones; use the ear opposite the side where you have your pacemaker/defibrillator.  Avoid carrying your cellular phone near your device. - When traveling through airports, show security personnel your identification card to avoid being screened in the metal detectors.  Ask the security personnel to use the hand wand. - Avoid arc welding equipment, MRI testing (magnetic resonance imaging), TENS units (transcutaneous nerve stimulators).  Call the office for questions about other devices. - Avoid electrical appliances that are in poor condition or are not properly grounded. - Microwave ovens are safe to be near or to operate.  Additional information for defibrillator patients should your device go off: - If your device goes off ONCE and you feel fine afterward, notify the device clinic nurses. - If your device goes off ONCE and you do not feel well afterward, call 911. - If your device goes  off TWICE, call 911. - If your device goes off THREE times in one day, call 911.  DO NOT DRIVE YOURSELF OR A FAMILY MEMBER WITH A DEFIBRILLATOR TO THE HOSPITAL--CALL 911.  Heart Failure Heart failure is a condition in which the heart has trouble pumping blood. This means your heart does not pump blood efficiently for your body to work well. In some cases of heart failure, fluid may back up into your lungs or you may have swelling (edema) in your lower legs. Heart failure is usually a long-term (chronic) condition. It is important for you to take good care of yourself and follow your health care provider's treatment plan. CAUSES  Some health conditions can cause heart failure. Those health conditions include:  High blood pressure (hypertension). Hypertension causes the heart muscle to work harder than normal. When pressure in the blood vessels is high, the heart needs to pump (contract) with more force in order to circulate blood throughout the body. High blood pressure eventually causes the heart to become stiff and weak.  Coronary artery disease (CAD). CAD is the buildup of cholesterol and fat (plaque) in the arteries of  the heart. The blockage in the arteries deprives the heart muscle of oxygen and blood. This can cause chest pain and may lead to a heart attack. High blood pressure can also contribute to CAD.  Heart attack (myocardial infarction). A heart attack occurs when one or more arteries in the heart become blocked. The loss of oxygen damages the muscle tissue of the heart. When this happens, part of the heart muscle dies. The injured tissue does not contract as well and weakens the heart's ability to pump blood.  Abnormal heart valves. When the heart valves do not open and close properly, it can cause heart failure. This makes the heart muscle pump harder to keep the blood flowing.  Heart muscle disease (cardiomyopathy or myocarditis). Heart muscle disease is damage to the heart muscle  from a variety of causes. These can include drug or alcohol abuse, infections, or unknown reasons. These can increase the risk of heart failure.  Lung disease. Lung disease makes the heart work harder because the lungs do not work properly. This can cause a strain on the heart, leading it to fail.  Diabetes. Diabetes increases the risk of heart failure. High blood sugar contributes to high fat (lipid) levels in the blood. Diabetes can also cause slow damage to tiny blood vessels that carry important nutrients to the heart muscle. When the heart does not get enough oxygen and food, it can cause the heart to become weak and stiff. This leads to a heart that does not contract efficiently.  Other conditions can contribute to heart failure. These include abnormal heart rhythms, thyroid problems, and low blood counts (anemia). Certain unhealthy behaviors can increase the risk of heart failure, including:  Being overweight.  Smoking or chewing tobacco.  Eating foods high in fat and cholesterol.  Abusing illicit drugs or alcohol.  Lacking physical activity. SYMPTOMS  Heart failure symptoms may vary and can be hard to detect. Symptoms may include:  Shortness of breath with activity, such as climbing stairs.  Persistent cough.  Swelling of the feet, ankles, legs, or abdomen.  Unexplained weight gain.  Difficulty breathing when lying flat (orthopnea).  Waking from sleep because of the need to sit up and get more air.  Rapid heartbeat.  Fatigue and loss of energy.  Feeling light-headed, dizzy, or close to fainting.  Loss of appetite.  Nausea.  Increased urination during the night (nocturia). DIAGNOSIS  A diagnosis of heart failure is based on your history, symptoms, physical examination, and diagnostic tests. Diagnostic tests for heart failure may include:  Echocardiography.  Electrocardiography.  Chest X-ray.  Blood tests.  Exercise stress test.  Cardiac  angiography.  Radionuclide scans. TREATMENT  Treatment is aimed at managing the symptoms of heart failure. Medicines, behavioral changes, or surgical intervention may be necessary to treat heart failure.  Medicines to help treat heart failure may include:  Angiotensin-converting enzyme (ACE) inhibitors. This type of medicine blocks the effects of a blood protein called angiotensin-converting enzyme. ACE inhibitors relax (dilate) the blood vessels and help lower blood pressure.  Angiotensin receptor blockers (ARBs). This type of medicine blocks the actions of a blood protein called angiotensin. Angiotensin receptor blockers dilate the blood vessels and help lower blood pressure.  Water pills (diuretics). Diuretics cause the kidneys to remove salt and water from the blood. The extra fluid is removed through urination. This loss of extra fluid lowers the volume of blood the heart pumps.  Beta blockers. These prevent the heart from beating too fast and  improve heart muscle strength.  Digitalis. This increases the force of the heartbeat.  Healthy behavior changes include:  Obtaining and maintaining a healthy weight.  Stopping smoking or chewing tobacco.  Eating heart-healthy foods.  Limiting or avoiding alcohol.  Stopping illicit drug use.  Physical activity as directed by your health care provider.  Surgical treatment for heart failure may include:  A procedure to open blocked arteries, repair damaged heart valves, or remove damaged heart muscle tissue.  A pacemaker to improve heart muscle function and control certain abnormal heart rhythms.  An internal cardioverter defibrillator to treat certain serious abnormal heart rhythms.  A left ventricular assist device (LVAD) to assist the pumping ability of the heart. HOME CARE INSTRUCTIONS   Take medicines only as directed by your health care provider. Medicines are important in reducing the workload of your heart, slowing the  progression of heart failure, and improving your symptoms.  Do not stop taking your medicine unless directed by your health care provider.  Do not skip any dose of medicine.  Refill your prescriptions before you run out of medicine. Your medicines are needed every day.  Engage in moderate physical activity if directed by your health care provider. Moderate physical activity can benefit some people. The elderly and people with severe heart failure should consult with a health care provider for physical activity recommendations.  Eat heart-healthy foods. Food choices should be free of trans fat and low in saturated fat, cholesterol, and salt (sodium). Healthy choices include fresh or frozen fruits and vegetables, fish, lean meats, legumes, fat-free or low-fat dairy products, and whole grain or high fiber foods. Talk to a dietitian to learn more about heart-healthy foods.  Limit sodium if directed by your health care provider. Sodium restriction may reduce symptoms of heart failure in some people. Talk to a dietitian to learn more about heart-healthy seasonings.  Use healthy cooking methods. Healthy cooking methods include roasting, grilling, broiling, baking, poaching, steaming, or stir-frying. Talk to a dietitian to learn more about healthy cooking methods.  Limit fluids if directed by your health care provider. Fluid restriction may reduce symptoms of heart failure in some people.  Weigh yourself every day. Daily weights are important in the early recognition of excess fluid. You should weigh yourself every morning after you urinate and before you eat breakfast. Wear the same amount of clothing each time you weigh yourself. Record your daily weight. Provide your health care provider with your weight record.  Monitor and record your blood pressure if directed by your health care provider.  Check your pulse if directed by your health care provider.  Lose weight if directed by your health care  provider. Weight loss may reduce symptoms of heart failure in some people.  Stop smoking or chewing tobacco. Nicotine makes your heart work harder by causing your blood vessels to constrict. Do not use nicotine gum or patches before talking to your health care provider.  Keep all follow-up visits as directed by your health care provider. This is important.  Limit alcohol intake to no more than 1 drink per day for nonpregnant women and 2 drinks per day for men. One drink equals 12 ounces of beer, 5 ounces of wine, or 1 ounces of hard liquor. Drinking more than that is harmful to your heart. Tell your health care provider if you drink alcohol several times a week. Talk with your health care provider about whether alcohol is safe for you. If your heart has already been damaged  by alcohol or you have severe heart failure, drinking alcohol should be stopped completely.  Stop illicit drug use.  Stay up-to-date with immunizations. It is especially important to prevent respiratory infections through current pneumococcal and influenza immunizations.  Manage other health conditions such as hypertension, diabetes, thyroid disease, or abnormal heart rhythms as directed by your health care provider.  Learn to manage stress.  Plan rest periods when fatigued.  Learn strategies to manage high temperatures. If the weather is extremely hot:  Avoid vigorous physical activity.  Use air conditioning or fans or seek a cooler location.  Avoid caffeine and alcohol.  Wear loose-fitting, lightweight, and light-colored clothing.  Learn strategies to manage cold temperatures. If the weather is extremely cold:  Avoid vigorous physical activity.  Layer clothes.  Wear mittens or gloves, a hat, and a scarf when going outside.  Avoid alcohol.  Obtain ongoing education and support as needed.  Participate in or seek rehabilitation as needed to maintain or improve independence and quality of life. SEEK MEDICAL  CARE IF:   You have a rapid weight gain.  You have increasing shortness of breath that is unusual for you.  You are unable to participate in your usual physical activities.  You tire easily.  You cough more than normal, especially with physical activity.  You have any or more swelling in areas such as your hands, feet, ankles, or abdomen.  You are unable to sleep because it is hard to breathe.  You feel like your heart is beating fast (palpitations).  You become dizzy or light-headed upon standing up. SEEK IMMEDIATE MEDICAL CARE IF:   You have difficulty breathing.  There is a change in mental status such as decreased alertness or difficulty with concentration.  You have a pain or discomfort in your chest.  You have an episode of fainting (syncope). MAKE SURE YOU:   Understand these instructions.  Will watch your condition.  Will get help right away if you are not doing well or get worse.   This information is not intended to replace advice given to you by your health care provider. Make sure you discuss any questions you have with your health care provider.   Document Released: 03/31/2005 Document Revised: 08/15/2014 Document Reviewed: 04/30/2012 Elsevier Interactive Patient Education [employer].

## 2015-01-20 NOTE — Discharge Summary (Signed)
Discharge Summary   Patient ID: Kevin Cardenas,  MRN: [id number], DOB/AGE: [birth year] 39 y.o.  Admit date: 01/19/2015 Discharge date: 01/20/2015  Primary Care Provider: [name] Primary Cardiologist: [name]  Discharge Diagnoses Principal Problem:   Non-ischemic cardiomyopathy [facility]) Active Problems:   Systolic CHF, chronic ([place])   Type II diabetes mellitus with manifestations ([place])   VSD (ventricular septal defect)   HTN (hypertension)   Hyperlipidemia with target LDL less than 100   OSA on CPAP   Allergies No Known Allergies  Procedures  Single chamber St Jude ICD implantation by Dr. Caryl Comes 01/19/2015 Procedure: single chamber ICD implantation without intraoperative defibrillation threshold testing  Device St Jude ICD, serial [id number] [facility] Scientific single coil active fixation defibrillator lead, model 0181 serial number [id number]   Postop CXR 01/20/2015  FINDINGS: Single lead AICD implanted from a left subclavian approach with the lead in the region of the right ventricle. No pneumothorax or hemothorax. Evidence of distant median sternotomy with chronic prominence of the left heart border. The lungs are clear. No edema.  IMPRESSION: No complication seen following single lead AICD placement with the lead in the region of the right ventricle.    Hospital Course  The patient is a 39 year old male with history of nonischemic cardiomyopathy, history of VSD status post repair at age 50 with pulmonary artery banding having been removed at that time, chronic systolic heart failure with EF 20-25%. He has moderate dyspnea on exertion and peripheral edema. He also has recurrent episode of tachycardia palpitation associated with presyncope. His EKG previously demonstrated Q-wave in lead 1, L, easy 6 [place] into the category of IVCD and hence unlikely to benefit from CRT. Given his persistent symptom, based on recently published DANISH trial , he will remain  appropriate to consider ICD implantation for primary prevention in this younger man with class III symptoms. After discussing potential benefit and risk of ICD implantation, he agreed to undergo transvenous ICD implantation as opposed to subcutaneous ICD.  Patient presented to [name] on 01/19/2015 for the planned procedure, a St. Jude single-chamber ICD ([place] ICD, serial [id number], [place] single coil active fixation defibrillator lead, model N2163866 serial number [id number]) was placed without intraoperative defibrillation threshold testing. Patient tolerated the procedure well without apparent complication. He was kept in the hospital for observation. He was seen on the following morning on 10/80/2016, chest x-ray was done which showed no apparent complication follow single lead ICD placement with lead in the region of the right ventricle. Patient appears to be doing well. He is deemed stable for discharge from electrophysiology perspective. Pacemaker activity and care instruction has been given to the patient prior to discharge.    Discharge Vitals Blood pressure 138/101, pulse 74, temperature 97.7 F (36.5 C), temperature source Oral, resp. rate 17, height _0  (1.676 m), weight 198 lb 4.8 oz (89.948 kg), SpO2 95 %.  Filed Weights   01/19/15 0548 01/20/15 0520  Weight: 200 lb (90.719 kg) 198 lb 4.8 oz (89.948 kg)    Disposition  Pt is being discharged home today in good condition.  Follow-up Plans & Appointments      Follow-up Information    Follow up with [facility] On 01/29/2015.   Specialty:  Cardiology   Why:  at [facility] for wound check   Contact information:   [address] [place] [phone]      Follow up with [facility] On 01/22/2015.   Specialty:  Cardiology   Why:  10:00am   Contact information:   [address] [id number] [place] [phone]      Discharge Medications    Medication List    TAKE these medications        aspirin 81 MG chewable tablet  Chew 1 tablet (81 mg total) by mouth daily.     carvedilol 12.5 MG tablet  Commonly known as:  COREG  Take 12.5 mg by mouth 2 (two) times daily.     digoxin 0.125 MG tablet  Commonly known as:  LANOXIN  TAKE 1 TABLET BY MOUTH DAILY     furosemide 40 MG tablet  Commonly known as:  LASIX  Take 1 tablet (40 mg total) by mouth 2 (two) times daily.     sacubitril-valsartan 49-51 MG  Commonly known as:  ENTRESTO  Take 1 tablet by mouth 2 (two) times daily.     simvastatin 20 MG tablet  Commonly known as:  ZOCOR  Take 1 tablet (20 mg total) by mouth at bedtime.     spironolactone 25 MG tablet  Commonly known as:  ALDACTONE  TAKE 1 TABLET BY MOUTH DAILY        Duration of Discharge Encounter   Greater than 30 minutes including physician time.  Hilbert Corrigan PA-C Pager: [phone] 01/20/2015, 10:45 AM

## 2015-01-22 ENCOUNTER — Ambulatory Visit (HOSPITAL_COMMUNITY)
Admission: RE | Admit: 2015-01-22 | Discharge: 2015-01-22 | Disposition: A | Discharge status: Home / Self Care | Payer: Medicaid Other | Source: Ambulatory Visit | Attending: Cardiology | Admitting: Cardiology

## 2015-01-22 ENCOUNTER — Encounter (HOSPITAL_COMMUNITY): Payer: Self-pay | Admitting: Internal Medicine

## 2015-01-22 VITALS — BP 106/64 | HR 84 | Wt 209.0 lb

## 2015-01-22 DIAGNOSIS — Z79899 Other long term (current) drug therapy: Secondary | ICD-10-CM | POA: Insufficient documentation

## 2015-01-22 DIAGNOSIS — Z7982 Long term (current) use of aspirin: Secondary | ICD-10-CM | POA: Diagnosis not present

## 2015-01-22 DIAGNOSIS — Z95810 Presence of automatic (implantable) cardiac defibrillator: Secondary | ICD-10-CM | POA: Insufficient documentation

## 2015-01-22 DIAGNOSIS — I10 Essential (primary) hypertension: Secondary | ICD-10-CM | POA: Insufficient documentation

## 2015-01-22 DIAGNOSIS — G4733 Obstructive sleep apnea (adult) (pediatric): Secondary | ICD-10-CM | POA: Insufficient documentation

## 2015-01-22 DIAGNOSIS — I370 Nonrheumatic pulmonary valve stenosis: Secondary | ICD-10-CM | POA: Insufficient documentation

## 2015-01-22 DIAGNOSIS — Q210 Ventricular septal defect: Secondary | ICD-10-CM

## 2015-01-22 DIAGNOSIS — E119 Type 2 diabetes mellitus without complications: Secondary | ICD-10-CM | POA: Diagnosis not present

## 2015-01-22 DIAGNOSIS — I428 Other cardiomyopathies: Secondary | ICD-10-CM | POA: Diagnosis not present

## 2015-01-22 DIAGNOSIS — I5022 Chronic systolic (congestive) heart failure: Secondary | ICD-10-CM | POA: Diagnosis not present

## 2015-01-22 MED FILL — Lidocaine HCl Local Preservative Free (PF) Inj 1%: INTRAMUSCULAR | Qty: 30 | Status: AC

## 2015-01-22 NOTE — Patient Instructions (Signed)
Continue current medications.   If run out of samples of Entresto please call us.   Follow up in 2 months with Amy  Do the following things EVERYDAY: 1. Weigh yourself in the morning before breakfast. Write it down and keep it in a log. 2. Take your medicines as prescribed 3. Eat low salt foods-Limit salt (sodium) to 2000 mg per day.  4. Stay as active as you can everyday 5. Limit all fluids for the day to less than 2 liters

## 2015-01-22 NOTE — Progress Notes (Signed)
Patient ID: Kevin Cardenas, male   DOB: [birth year], 39 y.o.   MRN: [id number] PCP: Dr. Ronnald Ramp Cardiology: [name]  Kevin Cardenas is a 39 y.o. gentlemen with history of VSD repair and removal of a pulmonary artery band at age 50, DM, HTN, systolic heart failure, NICM and OSA diagnosed in past but not on CPAP.   Cardiac cath in April 2009 showed His LVEF was 35-40%. Coronary arteries were normal. RA mean 22 mmHg, RV 62/25, PA 59/39 mean 47 mmHg, mean pulmonary capillary wedge pressure 37 mmHg. Cardiac output was 6.21 with a cardiac index of 2.85 by FICK, and 4.95 and 2.27 respectively by thermodilution. There was no shunting at the atrial or ventricular level.   He returns for follow up.  He is not doing much walking but says that his breathing overall is better.  Ok on flat ground and with a short flight of steps.  No orthopnea/PND.  Waiting for CPAP.  He has been out of Entresto for 3-4 days.   St Jude ICD implanted 10/16.   Echo 9/11: LVEF 20-25%  Echo November 1st, 2012: LVEF 30-35%, diffuse hypokinesis and mild LVH. RV systolic mildly reduced. LA mildly dilated. Pulmonic valve with mild stenosis.  Echo 05/12/2014: EF 15-20% Grade IDD, LV moderately dilated.  RV normal. Mild pulmonc stenosis mean peak gradient 14.  Echo 7/16: EF 25-30%, mild LV dilation, mild LVH, trivial pulmonic stenosis with peak gradient 11 mmHg, mildly decreased RV function with mild RV dilation.    CPX (3/16): RER 1.03, peak VO2 19.9, VE/VCO2 slope 18, mild to moderately impaired functional capacity.  There was moderate to severe mixed restrictive/obstructive pattern on spirometry => exertional hypoxemia, oxygen saturation decreased to 84%.   RHC 08/03/2014  RA mean 9 RV 42/9 PA 35/12, mean 22 PCWP mean 8 Peak to peak gradient across pulmonic valve 7 mmHg, mean gradient 6 mmHg.  Oxygen saturations: RA 56% RV 60% PA 61% AO 93% Cardiac Output (Fick) 3.44  Cardiac Index (Fick) 1.77 Cardiac Output (Thermo) 4.73 Cardiac  Index (Thermo) 2.44  Labs (1/16): K 4.1, creatinine 0.9, TSH normal, LDL 89, HDL 46 Labs (05/24/2014): K 4.1 Creatinine 0.97  Labs (3/16): BNP 54, K 4.4, creatinine 1.0 Labs 08/14/2014: K 4.0 Creatinine 0.93  Labs (6/16): digoxin < 0.2 Labs (9/16): K 3.7, creatinine 0.93, digoxin < 0.5, HCT 40.4  ROS: All systems negative except as listed in HPI, PMH and Problem List.  Past Medical History  Diagnosis Date  . Diabetes mellitus   . Hypertension   . VSD (ventricular septal defect)   . Non-ischemic cardiomyopathy ([place])   . Obstructive sleep apnea   . CHF (congestive heart failure) (H[facility])     Systolic, EF 40% per echo 11/12    Current Outpatient Prescriptions  Medication Sig Dispense Refill  . aspirin 81 MG chewable tablet Chew 1 tablet (81 mg total) by mouth daily. 30 tablet 0  . carvedilol (COREG) 12.5 MG tablet Take 12.5 mg by mouth 2 (two) times daily.  2  . digoxin (LANOXIN) 0.125 MG tablet TAKE 1 TABLET BY MOUTH DAILY 30 tablet 2  . furosemide (LASIX) 40 MG tablet Take 1 tablet (40 mg total) by mouth 2 (two) times daily. 60 tablet 2  . sacubitril-valsartan (ENTRESTO) 49-51 MG Take 1 tablet by mouth 2 (two) times daily. 60 tablet 3  . simvastatin (ZOCOR) 20 MG tablet Take 1 tablet (20 mg total) by mouth at bedtime. 30 tablet 5  . spironolactone (ALDACTONE) 25  MG tablet TAKE 1 TABLET BY MOUTH DAILY 30 tablet 2   No current facility-administered medications for this encounter.    PHYSICAL EXAM: Filed Vitals:   01/22/15 1002  BP: 106/64  Pulse: 84  Weight: 209 lb (94.802 kg)  SpO2: 98%    General: Well appearing. No respiratory difficulty  HEENT: normal  Neck: Thick. JVP not elevated. Carotids 2+ bilat; no bruits. No lymphadenopathy or thyromegaly appreciated.  Cor: PMI nondisplaced. Regular rate & rhythm with widely split S2. No rubs, gallops.  2/6 SEM LUSB.  Lungs: Clear Abdomen: soft, nontender, + distended. No hepatosplenomegaly. No bruits or masses. Good bowel sounds.   Extremities: no cyanosis, clubbing, rash, no edema.   Neuro: alert & oriented x 3, cranial nerves grossly intact. moves all 4 extremities w/o difficulty. Affect pleasant.   ASSESSMENT & PLAN: 1. Chronic systolic CHF: Nonischemic cardiomyopathy.  CPX in 3/16 showed a mild to moderate functional limitation, but PFTs were quite abnormal with a mixed restrictive/obstructive picture. RHC in 4/16 showed preserved cardiac index by thermodilution (low by Fick).   Echo in 7/16 showed EF 25-30% with mildly dilated and mildly dysfunctional RV.  He has a Research officer, political party ICD (IVCD on ECG, not thought to be likely to benefit from CRT).  NYHA class II symptoms, volume status stable.  - Continue Lasix 40 mg bid, BMET/BNP today.  - Continue  25 mg spironolactone daily - Continue 12.5 mg carvedilol twice a day and Entresto 49/51 bid. Will give him samples of Entresto today.  He should qualify for assistance program once he provides all the required information.  2. H/o VSD: No residual shunt noted on 7/16 echo.  Last RHC also did not suggest any significant left to right shunting.  3. Pulmonary stenosis: Very mild by 7/16 echo.  4. OSA: Waiting to get CPAP machine.    5. Abnormal PFTs: Pulmonary following.    Kevin McLean,MD 01/22/2015

## 2015-01-22 NOTE — Progress Notes (Signed)
Medication Samples have been provided to the patient.  Drug name: Alyson Reedy: 28  LOT: [id number]  Exp.Date: 05/2015  The patient has been instructed regarding the correct time, dose, and frequency of taking this medication, including desired effects and most common side effects.   Philemon Kingdom D 10:12 AM 01/22/2015

## 2015-01-29 ENCOUNTER — Ambulatory Visit (INDEPENDENT_AMBULATORY_CARE_PROVIDER_SITE_OTHER): Payer: Medicaid Other | Admitting: *Deleted

## 2015-01-29 DIAGNOSIS — I429 Cardiomyopathy, unspecified: Secondary | ICD-10-CM

## 2015-01-29 DIAGNOSIS — I428 Other cardiomyopathies: Secondary | ICD-10-CM

## 2015-01-30 LAB — CUP PACEART INCLINIC DEVICE CHECK
Battery Remaining Longevity: 103.2
Brady Statistic RV Percent Paced: 0 %
Date Time Interrogation Session: 20161017180540
HIGH POWER IMPEDANCE MEASURED VALUE: 59.625
Implantable Lead Model: 181
Lead Channel Impedance Value: 387.5 Ohm
Lead Channel Pacing Threshold Amplitude: 0.75 V
Lead Channel Pacing Threshold Amplitude: 0.75 V
Lead Channel Sensing Intrinsic Amplitude: 9.5 mV
Lead Channel Setting Pacing Amplitude: 3.5 V
Lead Channel Setting Pacing Pulse Width: 0.5 ms
MDC IDC LEAD IMPLANT DT: 20161007
MDC IDC LEAD LOCATION: 753860
MDC IDC LEAD SERIAL: 331179
MDC IDC MSMT LEADCHNL RV PACING THRESHOLD PULSEWIDTH: 0.5 ms
MDC IDC MSMT LEADCHNL RV PACING THRESHOLD PULSEWIDTH: 0.5 ms
MDC IDC SET LEADCHNL RV SENSING SENSITIVITY: 0.5 mV
Pulse Gen Serial Number: 7286706

## 2015-01-30 NOTE — Progress Notes (Signed)
Wound check appointment. Wound without redness or edema. Incision edges approximated, wound well healed. Normal device function. Threshold, sensing, and impedances consistent with implant measurements. Device programmed at 3.5V for extra safety margin until 3 month visit. Histogram distribution appropriate for patient and level of activity. No ventricular arrhythmias noted. Patient educated about wound care, arm mobility, lifting restrictions, shock plan. ROV in 3 months with SK.

## 2015-02-01 ENCOUNTER — Telehealth (HOSPITAL_COMMUNITY): Payer: Self-pay

## 2015-02-01 NOTE — Telephone Encounter (Signed)
Patient found to have a blood pressure of 124/92 today.  Is taking medications correctly and not eating a diet or fluids high in sodium.  Patient told he is welcome to come by for a nurse BP check tomorrow.  He is down 3 lbs from last week

## 2015-02-13 ENCOUNTER — Other Ambulatory Visit (HOSPITAL_COMMUNITY): Payer: Self-pay | Admitting: Cardiology

## 2015-02-20 ENCOUNTER — Other Ambulatory Visit (HOSPITAL_COMMUNITY): Payer: Self-pay | Admitting: Adult Health

## 2015-03-13 ENCOUNTER — Telehealth (HOSPITAL_COMMUNITY): Payer: Self-pay | Admitting: Pharmacist

## 2015-03-13 ENCOUNTER — Telehealth (HOSPITAL_COMMUNITY): Payer: Self-pay

## 2015-03-13 NOTE — Telephone Encounter (Signed)
Entresto patient assistance approved through 03/11/2016. Patient will receive at no cost.   Doroteo Bradford K. Velva Harman, PharmD, BCPS, CPP Clinical Pharmacist Pager: [phone] Phone: [phone] 03/13/2015 3:01 PM

## 2015-03-13 NOTE — Telephone Encounter (Signed)
[employer] Patient assistance called and said they are missing documents to work on her approval for assistance. Call: [phone]  Routed to [facility]

## 2015-03-15 ENCOUNTER — Other Ambulatory Visit (HOSPITAL_COMMUNITY): Payer: Self-pay | Admitting: Pharmacist

## 2015-03-15 ENCOUNTER — Ambulatory Visit (INDEPENDENT_AMBULATORY_CARE_PROVIDER_SITE_OTHER): Payer: Medicaid Other | Admitting: Pulmonary Disease

## 2015-03-15 DIAGNOSIS — G4733 Obstructive sleep apnea (adult) (pediatric): Secondary | ICD-10-CM

## 2015-03-15 MED ORDER — SACUBITRIL-VALSARTAN 49-51 MG PO TABS: 1.0000 | ORAL_TABLET | Freq: Two times a day (BID) | ORAL | Status: DC

## 2015-03-16 ENCOUNTER — Encounter: Payer: Self-pay | Admitting: Internal Medicine

## 2015-03-16 ENCOUNTER — Telehealth (HOSPITAL_COMMUNITY): Payer: Self-pay

## 2015-03-16 NOTE — Telephone Encounter (Signed)
Signed prescription sent to [facility]

## 2015-03-27 ENCOUNTER — Encounter (HOSPITAL_COMMUNITY): Payer: Self-pay

## 2015-03-27 ENCOUNTER — Ambulatory Visit (HOSPITAL_COMMUNITY)
Admission: RE | Admit: 2015-03-27 | Discharge: 2015-03-27 | Disposition: A | Discharge status: Home / Self Care | Payer: Medicaid Other | Source: Ambulatory Visit | Attending: Adult Health | Admitting: Adult Health

## 2015-03-27 VITALS — BP 112/72 | HR 80 | Wt 215.0 lb

## 2015-03-27 DIAGNOSIS — I429 Cardiomyopathy, unspecified: Secondary | ICD-10-CM | POA: Diagnosis not present

## 2015-03-27 DIAGNOSIS — I5022 Chronic systolic (congestive) heart failure: Secondary | ICD-10-CM | POA: Diagnosis present

## 2015-03-27 DIAGNOSIS — Z95810 Presence of automatic (implantable) cardiac defibrillator: Secondary | ICD-10-CM | POA: Insufficient documentation

## 2015-03-27 DIAGNOSIS — I110 Hypertensive heart disease with heart failure: Secondary | ICD-10-CM | POA: Insufficient documentation

## 2015-03-27 DIAGNOSIS — I159 Secondary hypertension, unspecified: Secondary | ICD-10-CM

## 2015-03-27 DIAGNOSIS — Z7982 Long term (current) use of aspirin: Secondary | ICD-10-CM | POA: Diagnosis not present

## 2015-03-27 DIAGNOSIS — Z79899 Other long term (current) drug therapy: Secondary | ICD-10-CM | POA: Diagnosis not present

## 2015-03-27 DIAGNOSIS — Q210 Ventricular septal defect: Secondary | ICD-10-CM

## 2015-03-27 DIAGNOSIS — G4733 Obstructive sleep apnea (adult) (pediatric): Secondary | ICD-10-CM

## 2015-03-27 DIAGNOSIS — E119 Type 2 diabetes mellitus without complications: Secondary | ICD-10-CM | POA: Diagnosis not present

## 2015-03-27 DIAGNOSIS — I370 Nonrheumatic pulmonary valve stenosis: Secondary | ICD-10-CM | POA: Diagnosis not present

## 2015-03-27 DIAGNOSIS — I428 Other cardiomyopathies: Secondary | ICD-10-CM | POA: Diagnosis not present

## 2015-03-27 DIAGNOSIS — Z9989 Dependence on other enabling machines and devices: Secondary | ICD-10-CM

## 2015-03-27 LAB — BASIC METABOLIC PANEL
Anion gap: 8 (ref 5–15)
BUN: 14 mg/dL (ref 6–20)
CHLORIDE: 103 mmol/L (ref 101–111)
CO2: 25 mmol/L (ref 22–32)
CREATININE: 1.1 mg/dL (ref 0.61–1.24)
Calcium: 9.2 mg/dL (ref 8.9–10.3)
Glucose, Bld: 126 mg/dL — ABNORMAL HIGH (ref 65–99)
POTASSIUM: 4.9 mmol/L (ref 3.5–5.1)
Sodium: 136 mmol/L (ref 135–145)

## 2015-03-27 MED ORDER — CARVEDILOL 12.5 MG PO TABS: ORAL_TABLET | ORAL | Status: DC

## 2015-03-27 NOTE — Progress Notes (Signed)
Patient ID: Kevin Cardenas, male   DOB: [birth year], 39 y.o.   MRN: [id number] PCP: Dr. Ronnald Ramp Cardiology: [name]  [name] is a 39 y.o. gentlemen with history of VSD repair and removal of a pulmonary artery band at age 84, DM, HTN, systolic heart failure, NICM and OSA diagnosed in past but not on CPAP.   Cardiac cath in April 2009 showed His LVEF was 35-40%. Coronary arteries were normal. RA mean 22 mmHg, RV 62/25, PA 59/39 mean 47 mmHg, mean pulmonary capillary wedge pressure 37 mmHg. Cardiac output was 6.21 with a cardiac index of 2.85 by FICK, and 4.95 and 2.27 respectively by thermodilution. There was no shunting at the atrial or ventricular level.   He returns for follow up.  Overall feeling ok. Has to take his time getting dressed. SOB with steps and inclines. Denies PND/Orthopnea. Waiting for CPAP. Weight at home 205-208 pounds.  Taking  all medications. No ICD shocks. Followed by paramedicine. Tries to follow low salt diet.   St Jude ICD implanted 10/16.   Echo 9/11: LVEF 20-25%  Echo November 1st, 2012: LVEF 30-35%, diffuse hypokinesis and mild LVH. RV systolic mildly reduced. LA mildly dilated. Pulmonic valve with mild stenosis.  Echo 05/12/2014: EF 15-20% Grade IDD, LV moderately dilated.  RV normal. Mild pulmonc stenosis mean peak gradient 14.  Echo 7/16: EF 25-30%, mild LV dilation, mild LVH, trivial pulmonic stenosis with peak gradient 11 mmHg, mildly decreased RV function with mild RV dilation.    CPX (3/16): RER 1.03, peak VO2 19.9, VE/VCO2 slope 18, mild to moderately impaired functional capacity.  There was moderate to severe mixed restrictive/obstructive pattern on spirometry => exertional hypoxemia, oxygen saturation decreased to 84%.   RHC 08/03/2014  RA mean 9 RV 42/9 PA 35/12, mean 22 PCWP mean 8 Peak to peak gradient across pulmonic valve 7 mmHg, mean gradient 6 mmHg.  Oxygen saturations: RA 56% RV 60% PA 61% AO 93% Cardiac Output (Fick) 3.44  Cardiac Index  (Fick) 1.77 Cardiac Output (Thermo) 4.73 Cardiac Index (Thermo) 2.44  Labs (1/16): K 4.1, creatinine 0.9, TSH normal, LDL 89, HDL 46 Labs (05/24/2014): K 4.1 Creatinine 0.97  Labs (3/16): BNP 54, K 4.4, creatinine 1.0 Labs 08/14/2014: K 4.0 Creatinine 0.93  Labs (6/16): digoxin < 0.2 Labs (9/16): K 3.7, creatinine 0.93, digoxin < 0.5, HCT 40.4  ROS: All systems negative except as listed in HPI, PMH and Problem List.  Past Medical History  Diagnosis Date  . Diabetes mellitus   . Hypertension   . VSD (ventricular septal defect)   . Non-ischemic cardiomyopathy ([employer])   . Obstructive sleep apnea   . CHF (congestive heart failure) (H[facility])     Systolic, EF 16% per echo 11/12    Current Outpatient Prescriptions  Medication Sig Dispense Refill  . aspirin 81 MG chewable tablet Chew 1 tablet (81 mg total) by mouth daily. 30 tablet 0  . carvedilol (COREG) 12.5 MG tablet TAKE 1 TABLET BY MOUTH TWICE DAILY 60 tablet 3  . digoxin (LANOXIN) 0.125 MG tablet TAKE 1 TABLET BY MOUTH DAILY 30 tablet 2  . furosemide (LASIX) 40 MG tablet TAKE 1 TABLET BY MOUTH 2 TIMES DAILY. 60 tablet 2  . sacubitril-valsartan (ENTRESTO) 49-51 MG Take 1 tablet by mouth 2 (two) times daily. 60 tablet 11  . simvastatin (ZOCOR) 20 MG tablet Take 1 tablet (20 mg total) by mouth at bedtime. 30 tablet 5  . spironolactone (ALDACTONE) 25 MG tablet TAKE 1 TABLET BY  MOUTH DAILY 30 tablet 2   No current facility-administered medications for this encounter.    PHYSICAL EXAM: Filed Vitals:   03/27/15 0947  BP: 112/72  Pulse: 80  Weight: 215 lb (97.523 kg)  SpO2: 97%    General: Well appearing. No respiratory difficulty  HEENT: normal  Neck: Thick. JVP not elevated. Carotids 2+ bilat; no bruits. No lymphadenopathy or thyromegaly appreciated.  Cor: PMI nondisplaced. Regular rate & rhythm with widely split S2. No rubs, gallops.  2/6 SEM LUSB. L upper chest scar from ICD.  Lungs: Clear Abdomen: soft, nontender, + distended.  No hepatosplenomegaly. No bruits or masses. Good bowel sounds.  Extremities: no cyanosis, clubbing, rash, no edema.   Neuro: alert & oriented x 3, cranial nerves grossly intact. moves all 4 extremities w/o difficulty. Affect pleasant.   ASSESSMENT & PLAN: 1. Chronic systolic CHF: Nonischemic cardiomyopathy.  CPX in 3/16 showed a mild to moderate functional limitation, but PFTs were quite abnormal with a mixed restrictive/obstructive picture. RHC in 4/16 showed preserved cardiac index by thermodilution (low by Fick).   Echo in 7/16 showed EF 25-30% with mildly dilated and mildly dysfunctional RV.  He has a Research officer, political party ICD (IVCD on ECG, not thought to be likely to benefit from CRT).   NYHA class II symptoms, volume status stable depsite weight gain.   - Continue Lasix 40 mg bid - Continue  25 mg spironolactone daily - Continue  12.5 mg carvedilol in am and increase pm dose to 18.75 mg.  -Continue Entresto 49/51 bid.  2. H/o VSD: No residual shunt noted on 7/16 echo.  Last RHC also did not suggest any significant left to right shunting.  3. Pulmonary stenosis: Very mild by 7/16 echo.  4. OSA: Waiting to get CPAP machine. Can not afford. Trying to get medicaid.    5. Abnormal PFTs: Pulmonary following.    Follow up in 2 months. Check BMET today.   [name],NP-C  03/27/2015

## 2015-03-27 NOTE — Patient Instructions (Signed)
INCREASE Coreg to 12.5 mg (1 tab) in the AM and 18.75 mg ( 1 and 1/2 tab) in the PM  Labs today  Your physician recommends that you schedule a follow-up appointment in: 2 months in the NP clinic  Do the following things EVERYDAY: 1) Weigh yourself in the morning before breakfast. Write it down and keep it in a log. 2) Take your medicines as prescribed 3) Eat low salt foods-Limit salt (sodium) to 2000 mg per day.  4) Stay as active as you can everyday 5) Limit all fluids for the day to less than 2 liters 6)

## 2015-04-18 ENCOUNTER — Encounter (HOSPITAL_COMMUNITY): Payer: Self-pay

## 2015-04-18 NOTE — Progress Notes (Signed)
Paperwork and forms completed and faxed to [employer] for SS claim. Patient made aware, copy of paperwork and forms scanned into electronic medical record.  Renee Pain

## 2015-04-23 ENCOUNTER — Ambulatory Visit (INDEPENDENT_AMBULATORY_CARE_PROVIDER_SITE_OTHER): Payer: Medicaid Other | Admitting: Internal Medicine

## 2015-04-23 ENCOUNTER — Encounter: Payer: Self-pay | Admitting: Internal Medicine

## 2015-04-23 VITALS — BP 106/70 | HR 71 | Ht 66.0 in | Wt 219.2 lb

## 2015-04-23 DIAGNOSIS — I428 Other cardiomyopathies: Secondary | ICD-10-CM

## 2015-04-23 DIAGNOSIS — I5022 Chronic systolic (congestive) heart failure: Secondary | ICD-10-CM | POA: Diagnosis not present

## 2015-04-23 DIAGNOSIS — I429 Cardiomyopathy, unspecified: Secondary | ICD-10-CM

## 2015-04-23 NOTE — Patient Instructions (Addendum)
Medication Instructions: -Continue all your current medications  Lab work: -no lab today  Procedures/Testing: -none  Follow-Up: You will have a remote check from home on April 10th and you will follow up with Dr. Caryl Comes in 6 months.

## 2015-04-23 NOTE — Progress Notes (Signed)
Patient Care Team: [name], MD as PCP - General (Internal Medicine)   HPI  Kevin Cardenas is a 40 y.o. male Seen in followup for ICD implanted for primary prevention in setting of nonischemic cardiomyopathy with prior VSD repair    Ejection fraction 2009 was 35% or so ejection fraction most recently has remained in the 20-25% range. He has modest dyspnea on exertion some peripheral edema.    Some edema but no PND or orthopnea   Diet well restricted  His ECG previously had demonstrated Q waves in leads 1, L, and V6 rendering him into the category of IVCD and hence unlikely to benefit from CRT  LAbs 12/16  K 4.9 Cr 1.1    Past Medical History  Diagnosis Date  . Diabetes mellitus   . Hypertension   . VSD (ventricular septal defect)   . Non-ischemic cardiomyopathy ([place])   . Obstructive sleep apnea   . CHF (congestive heart failure) (H[facility])     Systolic, EF 81% per echo 11/12    Past Surgical History  Procedure Laterality Date  . Vsd repair    . Right heart catheterization N/A 08/03/2014    Procedure: RIGHT HEART CATH;  Surgeon: Larey Dresser, MD;  Location: [facility] CATH LAB;  Service: Cardiovascular;  Laterality: N/A;  . Ep implantable device N/A 01/19/2015    Procedure: ICD Implant;  Surgeon: Deboraha Sprang, MD;  Location: [facility] CV LAB;  Service: Cardiovascular;  Laterality: N/A;    Current Outpatient Prescriptions  Medication Sig Dispense Refill  . aspirin 81 MG chewable tablet Chew 1 tablet (81 mg total) by mouth daily. 30 tablet 0  . carvedilol (COREG) 12.5 MG tablet Take one tab (12.18m ) in the AM and take one and one half tab (18.759m in the PM 90 tablet 3  . Chlorphen-PE-Acetaminophen (CORICIDIN D COLD/FLU/SINUS PO) Take 2 tablets by mouth daily as needed (for cold and cough).    . digoxin (LANOXIN) 0.125 MG tablet TAKE 1 TABLET BY MOUTH DAILY 30 tablet 2  . furosemide (LASIX) 40 MG tablet TAKE 1 TABLET BY MOUTH 2 TIMES DAILY. 60 tablet 2  .  sacubitril-valsartan (ENTRESTO) 49-51 MG Take 1 tablet by mouth 2 (two) times daily. 60 tablet 11  . simvastatin (ZOCOR) 20 MG tablet Take 1 tablet (20 mg total) by mouth at bedtime. 30 tablet 5  . spironolactone (ALDACTONE) 25 MG tablet TAKE 1 TABLET BY MOUTH DAILY 30 tablet 2   No current facility-administered medications for this visit.    No Known Allergies    Review of Systems negative except from HPI and PMH  Physical Exam BP 106/70 mmHg  Pulse 71  Ht 5' 6" (1.676 m)  Wt 219 lb 3.2 oz (99.428 kg)  BMI 35.40 kg/m2 Well developed and well nourished in no acute distress HENT normal E scleral and icterus clear Neck Supple Device pocket well healed; without hematoma or erythema.  There is no tethering  JVP flat; carotids brisk and full Clear to ausculation  Regular rate and rhythm, 2/6 systolic murmur Soft with active bowel sounds No clubbing cyanosis  Edema Alert and oriented, grossly normal motor and sensory function Skin Warm and Dry   ECG demonstrates sinus rhythm at 71 Intervals 17/14/39 IVCD  Assessment and  Plan  Nonischemic cardiomyopathy  Congestive heart failure-class II!  History of VSD repair  ICD St Jude   Euvolemic continue current meds  Guideline directed medical therapy  Reviewed  device issues  We spent more than 50% of our >25 min visit in face to face counseling regarding the above

## 2015-05-16 ENCOUNTER — Other Ambulatory Visit (HOSPITAL_COMMUNITY): Payer: Self-pay | Admitting: Adult Health

## 2015-05-16 MED FILL — FUROSEMIDE 40 MG TABLET: 40 | 30 days supply | Qty: 60 | Fill #2

## 2015-05-16 MED FILL — DIGITEK 125 MCG TABLET: 125 | 30 days supply | Qty: 30 | Fill #0

## 2015-05-16 MED FILL — SPIRONOLACTONE 25 MG TABLET: 25 | 30 days supply | Qty: 30 | Fill #0

## 2015-06-13 NOTE — Progress Notes (Signed)
Entered in error

## 2015-06-19 MED FILL — SPIRONOLACTONE 25 MG TABLET: 25 | 30 days supply | Qty: 30 | Fill #1

## 2015-06-19 MED FILL — DIGITEK 125 MCG TABLET: 125 | 30 days supply | Qty: 30 | Fill #1

## 2015-06-19 MED FILL — CARVEDILOL 12.5 MG TABLET: 12.5 | 30 days supply | Qty: 75 | Fill #1

## 2015-07-02 ENCOUNTER — Other Ambulatory Visit (HOSPITAL_COMMUNITY): Payer: Self-pay | Admitting: *Deleted

## 2015-07-02 MED ORDER — FUROSEMIDE 40 MG PO TABS: ORAL_TABLET | ORAL | Status: DC

## 2015-07-03 MED FILL — FUROSEMIDE 40 MG TABLET: 40 | 30 days supply | Qty: 60 | Fill #0

## 2015-07-03 MED FILL — SIMVASTATIN 20 MG TABLET: 20 | 30 days supply | Qty: 30 | Fill #3

## 2015-07-23 ENCOUNTER — Encounter: Payer: Medicaid Other | Admitting: *Deleted

## 2015-07-23 ENCOUNTER — Telehealth: Payer: Self-pay | Admitting: Cardiology

## 2015-07-23 NOTE — Telephone Encounter (Signed)
Spoke with pt and reminded pt of remote transmission that is due today. Pt verbalized understanding.   

## 2015-07-30 ENCOUNTER — Encounter: Payer: Self-pay | Admitting: Cardiology

## 2015-08-03 ENCOUNTER — Ambulatory Visit (INDEPENDENT_AMBULATORY_CARE_PROVIDER_SITE_OTHER): Payer: Medicare Other | Admitting: *Deleted

## 2015-08-03 DIAGNOSIS — I429 Cardiomyopathy, unspecified: Secondary | ICD-10-CM | POA: Diagnosis not present

## 2015-08-03 DIAGNOSIS — I428 Other cardiomyopathies: Secondary | ICD-10-CM

## 2015-08-03 NOTE — Progress Notes (Signed)
Remote ICD transmission.   

## 2015-08-27 MED FILL — CARVEDILOL 12.5 MG TABLET: 12.5 | 30 days supply | Qty: 75 | Fill #2

## 2015-08-27 MED FILL — SIMVASTATIN 20 MG TABLET: 20 | 30 days supply | Qty: 30 | Fill #4

## 2015-08-27 MED FILL — SPIRONOLACTONE 25 MG TABLET: 25 | 30 days supply | Qty: 30 | Fill #2

## 2015-08-27 MED FILL — DIGITEK 125 MCG TABLET: 125 | 30 days supply | Qty: 30 | Fill #2

## 2015-08-27 MED FILL — FUROSEMIDE 40 MG TABLET: 40 | 30 days supply | Qty: 60 | Fill #1

## 2015-09-07 ENCOUNTER — Encounter: Payer: Self-pay | Admitting: Cardiology

## 2015-09-07 LAB — CUP PACEART REMOTE DEVICE CHECK
HighPow Impedance: 66 Ohm
Implantable Lead Model: 181
Implantable Lead Serial Number: 331179
Lead Channel Impedance Value: 400 Ohm
Lead Channel Setting Pacing Amplitude: 3.5 V
MDC IDC LEAD IMPLANT DT: 20161007
MDC IDC LEAD LOCATION: 753860
MDC IDC MSMT LEADCHNL RV SENSING INTR AMPL: 9.1 mV
MDC IDC PG SERIAL: 7286706
MDC IDC SESS DTM: 20170526124326
MDC IDC SET LEADCHNL RV PACING PULSEWIDTH: 0.5 ms
MDC IDC SET LEADCHNL RV SENSING SENSITIVITY: 0.5 mV

## 2015-10-25 ENCOUNTER — Other Ambulatory Visit (HOSPITAL_COMMUNITY): Payer: Self-pay | Admitting: Adult Health

## 2015-10-25 MED FILL — FUROSEMIDE 40 MG TABLET: 40 | 30 days supply | Qty: 60 | Fill #2

## 2015-10-25 MED FILL — CARVEDILOL 12.5 MG TABLET: 12.5 | 30 days supply | Qty: 75 | Fill #3

## 2015-10-25 MED FILL — SPIRONOLACTONE 25 MG TABLET: 25 | 30 days supply | Qty: 30 | Fill #0

## 2015-10-25 MED FILL — SIMVASTATIN 20 MG TABLET: 20 | 30 days supply | Qty: 30 | Fill #5

## 2015-10-25 MED FILL — DIGITEK 125 MCG TABLET: 125 | 30 days supply | Qty: 30 | Fill #0

## 2015-11-05 ENCOUNTER — Ambulatory Visit (INDEPENDENT_AMBULATORY_CARE_PROVIDER_SITE_OTHER): Payer: Medicare Other | Admitting: *Deleted

## 2015-11-05 DIAGNOSIS — I429 Cardiomyopathy, unspecified: Secondary | ICD-10-CM | POA: Diagnosis not present

## 2015-11-05 DIAGNOSIS — I428 Other cardiomyopathies: Secondary | ICD-10-CM

## 2015-11-05 LAB — CUP PACEART REMOTE DEVICE CHECK
Battery Remaining Longevity: 93 mo
Battery Remaining Percentage: 90 %
Brady Statistic RV Percent Paced: 1 % — CL
Date Time Interrogation Session: 20170726101926
HighPow Impedance: 68 Ohm
Implantable Lead Location: 753860
Implantable Lead Model: 181
Implantable Lead Serial Number: 331179
Lead Channel Impedance Value: 400 Ohm
Lead Channel Sensing Intrinsic Amplitude: 9.8 mV
Lead Channel Setting Pacing Amplitude: 2.5 V
Lead Channel Setting Pacing Pulse Width: 0.5 ms
Lead Channel Setting Sensing Sensitivity: 0.5 mV
MDC IDC LEAD IMPLANT DT: 20161007
Pulse Gen Serial Number: 7286706

## 2015-11-05 NOTE — Progress Notes (Signed)
Remote ICD transmission.

## 2015-11-07 ENCOUNTER — Encounter: Payer: Self-pay | Admitting: Cardiology

## 2015-12-10 ENCOUNTER — Other Ambulatory Visit (HOSPITAL_COMMUNITY): Payer: Self-pay | Admitting: Internal Medicine

## 2015-12-10 ENCOUNTER — Other Ambulatory Visit (HOSPITAL_COMMUNITY): Payer: Self-pay | Admitting: Adult Health

## 2015-12-10 DIAGNOSIS — E118 Type 2 diabetes mellitus with unspecified complications: Secondary | ICD-10-CM

## 2015-12-10 DIAGNOSIS — E785 Hyperlipidemia, unspecified: Secondary | ICD-10-CM

## 2015-12-10 MED FILL — FUROSEMIDE 40 MG TABLET: 40 | 30 days supply | Qty: 60 | Fill #0

## 2015-12-10 MED FILL — CARVEDILOL 12.5 MG TABLET: 12.5 | 24 days supply | Qty: 60 | Fill #4

## 2015-12-10 MED FILL — SPIRONOLACTONE 25 MG TABLET: 25 | 30 days supply | Qty: 30 | Fill #1

## 2015-12-10 MED FILL — DIGITEK 125 MCG TABLET: 125 | 30 days supply | Qty: 30 | Fill #1

## 2015-12-10 MED FILL — SIMVASTATIN 20 MG TABLET: 20 | 30 days supply | Qty: 30 | Fill #0

## 2016-01-15 ENCOUNTER — Ambulatory Visit (INDEPENDENT_AMBULATORY_CARE_PROVIDER_SITE_OTHER): Payer: Medicare Other | Admitting: Internal Medicine

## 2016-01-15 ENCOUNTER — Ambulatory Visit (INDEPENDENT_AMBULATORY_CARE_PROVIDER_SITE_OTHER)
Admission: RE | Admit: 2016-01-15 | Discharge: 2016-01-15 | Disposition: A | Discharge status: Home / Self Care | Payer: Medicare Other | Source: Ambulatory Visit | Attending: Internal Medicine | Admitting: Internal Medicine

## 2016-01-15 ENCOUNTER — Encounter: Payer: Self-pay | Admitting: Internal Medicine

## 2016-01-15 ENCOUNTER — Other Ambulatory Visit (INDEPENDENT_AMBULATORY_CARE_PROVIDER_SITE_OTHER): Payer: Medicare Other

## 2016-01-15 VITALS — BP 124/82 | HR 90 | Temp 98.5°F | Resp 16 | Ht 66.0 in | Wt 232.4 lb

## 2016-01-15 DIAGNOSIS — E785 Hyperlipidemia, unspecified: Secondary | ICD-10-CM

## 2016-01-15 DIAGNOSIS — E6609 Other obesity due to excess calories: Secondary | ICD-10-CM

## 2016-01-15 DIAGNOSIS — R059 Cough, unspecified: Secondary | ICD-10-CM

## 2016-01-15 DIAGNOSIS — J309 Allergic rhinitis, unspecified: Secondary | ICD-10-CM | POA: Diagnosis not present

## 2016-01-15 DIAGNOSIS — N400 Enlarged prostate without lower urinary tract symptoms: Secondary | ICD-10-CM | POA: Diagnosis not present

## 2016-01-15 DIAGNOSIS — R05 Cough: Secondary | ICD-10-CM

## 2016-01-15 DIAGNOSIS — IMO0001 Reserved for inherently not codable concepts without codable children: Secondary | ICD-10-CM

## 2016-01-15 DIAGNOSIS — E118 Type 2 diabetes mellitus with unspecified complications: Secondary | ICD-10-CM

## 2016-01-15 DIAGNOSIS — D696 Thrombocytopenia, unspecified: Secondary | ICD-10-CM

## 2016-01-15 DIAGNOSIS — Z0000 Encounter for general adult medical examination without abnormal findings: Secondary | ICD-10-CM | POA: Diagnosis not present

## 2016-01-15 DIAGNOSIS — I5022 Chronic systolic (congestive) heart failure: Secondary | ICD-10-CM

## 2016-01-15 DIAGNOSIS — Z23 Encounter for immunization: Secondary | ICD-10-CM | POA: Diagnosis not present

## 2016-01-15 DIAGNOSIS — J4520 Mild intermittent asthma, uncomplicated: Secondary | ICD-10-CM | POA: Insufficient documentation

## 2016-01-15 DIAGNOSIS — I10 Essential (primary) hypertension: Secondary | ICD-10-CM

## 2016-01-15 DIAGNOSIS — J4541 Moderate persistent asthma with (acute) exacerbation: Secondary | ICD-10-CM | POA: Diagnosis not present

## 2016-01-15 LAB — CBC WITH DIFFERENTIAL/PLATELET
BASOS ABS: 0 10*3/uL (ref 0.0–0.1)
Basophils Relative: 0.6 % (ref 0.0–3.0)
EOS ABS: 0.1 10*3/uL (ref 0.0–0.7)
Eosinophils Relative: 1.5 % (ref 0.0–5.0)
HCT: 41.8 % (ref 39.0–52.0)
Hemoglobin: 14.4 g/dL (ref 13.0–17.0)
LYMPHS ABS: 1.6 10*3/uL (ref 0.7–4.0)
Lymphocytes Relative: 31.4 % (ref 12.0–46.0)
MCHC: 34.3 g/dL (ref 30.0–36.0)
MCV: 94.3 fl (ref 78.0–100.0)
MONO ABS: 0.5 10*3/uL (ref 0.1–1.0)
Monocytes Relative: 10.5 % (ref 3.0–12.0)
NEUTROS ABS: 2.8 10*3/uL (ref 1.4–7.7)
NEUTROS PCT: 56 % (ref 43.0–77.0)
PLATELETS: 168 10*3/uL (ref 150.0–400.0)
RBC: 4.44 Mil/uL (ref 4.22–5.81)
RDW: 13.1 % (ref 11.5–15.5)
WBC: 5.1 10*3/uL (ref 4.0–10.5)

## 2016-01-15 LAB — LIPID PANEL
CHOL/HDL RATIO: 3
CHOLESTEROL: 131 mg/dL (ref 0–200)
HDL: 49.4 mg/dL (ref >39.00)
LDL CALC: 48 mg/dL (ref 0–99)
NonHDL: 81.4
TRIGLYCERIDES: 165 mg/dL — AB (ref 0.0–149.0)
VLDL: 33 mg/dL (ref 0.0–40.0)

## 2016-01-15 LAB — COMPREHENSIVE METABOLIC PANEL
ALT: 19 U/L (ref 0–53)
AST: 20 U/L (ref 0–37)
Albumin: 4 g/dL (ref 3.5–5.2)
Alkaline Phosphatase: 59 U/L (ref 39–117)
BILIRUBIN TOTAL: 0.7 mg/dL (ref 0.2–1.2)
BUN: 14 mg/dL (ref 6–23)
CO2: 32 meq/L (ref 19–32)
CREATININE: 0.94 mg/dL (ref 0.40–1.50)
Calcium: 8.6 mg/dL (ref 8.4–10.5)
Chloride: 103 mEq/L (ref 96–112)
GFR: 113.73 mL/min (ref >60.00)
GLUCOSE: 99 mg/dL (ref 70–99)
Potassium: 4.1 mEq/L (ref 3.5–5.1)
Sodium: 141 mEq/L (ref 135–145)
Total Protein: 6.7 g/dL (ref 6.0–8.3)

## 2016-01-15 LAB — MICROALBUMIN / CREATININE URINE RATIO
Creatinine,U: 184.3 mg/dL
MICROALB UR: 0.8 mg/dL (ref 0.0–1.9)
Microalb Creat Ratio: 0.4 mg/g (ref 0.0–30.0)

## 2016-01-15 LAB — URINALYSIS, ROUTINE W REFLEX MICROSCOPIC
Bilirubin Urine: NEGATIVE
Hgb urine dipstick: NEGATIVE
KETONES UR: NEGATIVE
Leukocytes, UA: NEGATIVE
NITRITE: NEGATIVE
RBC / HPF: NONE SEEN (ref 0–?)
SPECIFIC GRAVITY, URINE: 1.025 (ref 1.000–1.030)
Total Protein, Urine: NEGATIVE
Urine Glucose: NEGATIVE
Urobilinogen, UA: 0.2 (ref 0.0–1.0)
WBC UA: NONE SEEN (ref 0–?)
pH: 6 (ref 5.0–8.0)

## 2016-01-15 LAB — PSA: PSA: 0.36 ng/mL (ref 0.10–4.00)

## 2016-01-15 LAB — HEMOGLOBIN A1C: Hgb A1c MFr Bld: 6.1 % (ref 4.6–6.5)

## 2016-01-15 LAB — TSH: TSH: 1.92 u[IU]/mL (ref 0.35–4.50)

## 2016-01-15 MED ORDER — FLUTICASONE FUROATE-VILANTEROL 200-25 MCG/INH IN AEPB: 1.0000 | INHALATION_SPRAY | Freq: Every day | RESPIRATORY_TRACT | 11 refills | Status: DC

## 2016-01-15 MED ORDER — ALBUTEROL SULFATE (2.5 MG/3ML) 0.083% IN NEBU: 2.5000 mg | INHALATION_SOLUTION | Freq: Four times a day (QID) | RESPIRATORY_TRACT | 11 refills | Status: DC | PRN

## 2016-01-15 MED ORDER — METHYLPREDNISOLONE ACETATE 80 MG/ML IJ SUSP
120.0000 mg | Freq: Once | INTRAMUSCULAR | Status: AC
  Administered 2016-01-15: 120 mg via INTRAMUSCULAR

## 2016-01-15 MED FILL — BREO ELLIPTA 200-25 MCG INH: 200-25 | 30 days supply | Qty: 30 | Fill #0

## 2016-01-15 MED FILL — ALBUTEROL 0.083% INHAL SOLN: (2.5 MG/3ML | 6 days supply | Qty: 75 | Fill #0

## 2016-01-15 NOTE — Progress Notes (Signed)
Subjective:  Patient ID: Kevin Cardenas, male    DOB: 10/26/1975  Age: 40 y.o. MRN: [id number]  CC: Annual Exam; Hypertension; Hyperlipidemia; Diabetes; and Cough   HPI Kevin Cardenas presents for an AWV/CPX.  He complains of a one-week history of cough that's productive of yellow phlegm with wheezing, sneezing, nasal congestion, and runny nose. He denies fever, chills, night sweats, sore throat, hemoptysis, or chest pain. He has a history of exposure to secondhand smoke and he is an intermittent smoker himself. He has been using a family members nebulizer at home with albuterol and has gotten some symptom relief. He also has a history of asthma.  Past Medical History:  Diagnosis Date  . CHF (congestive heart failure) (HCC)    Systolic, EF 19% per echo 11/12  . Diabetes mellitus   . Hypertension   . Non-ischemic cardiomyopathy ([place])   . Obstructive sleep apnea   . VSD (ventricular septal defect)    Past Surgical History:  Procedure Laterality Date  . EP IMPLANTABLE DEVICE N/A 01/19/2015   Procedure: ICD Implant;  Surgeon: Deboraha Sprang, MD;  Location: [facility];  Service: Cardiovascular;  Laterality: N/A;  . RIGHT HEART CATHETERIZATION N/A 08/03/2014   Procedure: RIGHT HEART CATH;  Surgeon: Larey Dresser, MD;  Location: [facility] C[facility] LAB;  Service: Cardiovascular;  Laterality: N/A;  . VSD REPAIR      reports that he has been smoking Cigarettes.  He has a 4.50 pack-year smoking history. He has never used smokeless tobacco. He reports that he does not drink alcohol or use drugs. family history includes Diabetes in his maternal grandfather and paternal grandmother; Lung cancer in his father; Stroke in his father and mother. No Known Allergies  Outpatient Medications Prior to Visit  Medication Sig Dispense Refill  . aspirin 81 MG chewable tablet Chew 1 tablet (81 mg total) by mouth daily. 30 tablet 0  . carvedilol (COREG) 12.5 MG tablet Take one tab (12.44m ) in the AM and  take one and one half tab (18.751m in the PM 90 tablet 3  . DIGITEK 125 MCG tablet TAKE 1 TABLET BY MOUTH DAILY 30 tablet PRN  . furosemide (LASIX) 40 MG tablet TAKE 1 TABLET BY MOUTH 2 TIMES DAILY. 60 tablet 2  . sacubitril-valsartan (ENTRESTO) 49-51 MG Take 1 tablet by mouth 2 (two) times daily. 60 tablet 11  . simvastatin (ZOCOR) 20 MG tablet TAKE 1 TABLET BY MOUTH ONCE DAILY AT BEDTIME 30 tablet 5  . spironolactone (ALDACTONE) 25 MG tablet TAKE 1 TABLET BY MOUTH ONCE DAILY 30 tablet PRN  . Chlorphen-PE-Acetaminophen (CORICIDIN D COLD/FLU/SINUS PO) Take 2 tablets by mouth daily as needed (for cold and cough).     No facility-administered medications prior to visit.     ROS Review of Systems  Constitutional: Negative.  Negative for activity change, appetite change, chills, diaphoresis, fatigue, fever and unexpected weight change.  HENT: Positive for congestion, postnasal drip, rhinorrhea and sneezing. Negative for facial swelling, nosebleeds, sinus pressure, sore throat, tinnitus, trouble swallowing and voice change.   Eyes: Negative.  Negative for photophobia and visual disturbance.  Respiratory: Positive for cough and wheezing. Negative for choking, chest tightness, shortness of breath and stridor.   Cardiovascular: Negative.  Negative for chest pain, palpitations and leg swelling.  Gastrointestinal: Negative.  Negative for abdominal pain, constipation, diarrhea, nausea and vomiting.  Endocrine: Negative.   Genitourinary: Negative.  Negative for decreased urine volume, difficulty urinating and urgency.  Musculoskeletal:  Negative.  Negative for arthralgias, back pain, myalgias and neck pain.  Skin: Negative.  Negative for color change and rash.  Allergic/Immunologic: Negative.   Neurological: Negative.  Negative for dizziness and weakness.  Hematological: Negative.  Negative for adenopathy. Does not bruise/bleed easily.  Psychiatric/Behavioral: Negative.     Objective:  BP 124/82  (BP Location: Right Arm, Patient Position: Sitting, Cuff Size: Normal)   Pulse 90   Temp 98.5 F (36.9 C) (Oral)   Resp 16   Ht _0  (1.676 m)   Wt 232 lb 6 oz (105.4 kg)   SpO2 96%   BMI 37.51 kg/m   BP Readings from Last 3 Encounters:  01/15/16 124/82  04/23/15 106/70  03/27/15 112/72    Wt Readings from Last 3 Encounters:  01/15/16 232 lb 6 oz (105.4 kg)  04/23/15 219 lb 3.2 oz (99.4 kg)  03/27/15 215 lb (97.5 kg)    Physical Exam  Constitutional: He is oriented to person, place, and time. No distress.  HENT:  Head: Normocephalic and atraumatic.  Right Ear: Hearing, tympanic membrane, external ear and ear canal normal.  Left Ear: Hearing, tympanic membrane, external ear and ear canal normal.  Nose: Mucosal edema and rhinorrhea present. Right sinus exhibits no maxillary sinus tenderness and no frontal sinus tenderness. Left sinus exhibits no maxillary sinus tenderness and no frontal sinus tenderness.  Mouth/Throat: Oropharynx is clear and moist and mucous membranes are normal. Mucous membranes are not pale, not dry and not cyanotic. No trismus in the jaw. No uvula swelling. No oropharyngeal exudate, posterior oropharyngeal edema, posterior oropharyngeal erythema or tonsillar abscesses.  Eyes: Conjunctivae are normal. Right eye exhibits no discharge. Left eye exhibits no discharge. No scleral icterus.  Neck: Normal range of motion. Neck supple. No JVD present. No tracheal deviation present. No thyromegaly present.  Cardiovascular: Normal rate, regular rhythm, S1 normal, S2 normal and intact distal pulses.  Exam reveals gallop and S3. Exam reveals no S4 and no friction rub.   Murmur heard.  Systolic murmur is present with a grade of 1/6   No diastolic murmur is present  Pulmonary/Chest: Effort normal. No accessory muscle usage or stridor. No tachypnea. No respiratory distress. He has no decreased breath sounds. He has wheezes in the right upper field, the right middle field, the  right lower field, the left upper field, the left middle field and the left lower field. He has rhonchi in the right middle field and the left middle field. He has no rales. He exhibits no tenderness.  Abdominal: Soft. Bowel sounds are normal. He exhibits no distension and no mass. There is no tenderness. There is no rebound and no guarding.  Musculoskeletal: Normal range of motion. He exhibits no edema, tenderness or deformity.  Lymphadenopathy:    He has no cervical adenopathy.  Neurological: He is oriented to person, place, and time.  Skin: Skin is warm and dry. No rash noted. He is not diaphoretic. No erythema. No pallor.  Psychiatric: He has a normal mood and affect. His behavior is normal. Judgment and thought content normal.  Vitals reviewed.   Lab Results  Component Value Date   WBC 5.1 01/15/2016   HGB 14.4 01/15/2016   HCT 41.8 01/15/2016   PLT 168.0 01/15/2016   GLUCOSE 99 01/15/2016   CHOL 131 01/15/2016   TRIG 165.0 (H) 01/15/2016   HDL 49.40 01/15/2016   LDLCALC 48 01/15/2016   ALT 19 01/15/2016   AST 20 01/15/2016   NA 141 01/15/2016  K 4.1 01/15/2016   CL 103 01/15/2016   CREATININE 0.94 01/15/2016   BUN 14 01/15/2016   CO2 32 01/15/2016   TSH 1.92 01/15/2016   PSA 0.36 01/15/2016   INR 1.06 08/03/2014   HGBA1C 6.1 01/15/2016   MICROALBUR 0.8 01/15/2016    No results found.  Assessment & Plan:   Gabriel was seen today for annual exam, hypertension, hyperlipidemia, diabetes and cough.  Diagnoses and all orders for this visit:  Essential hypertension- His blood pressure is well-controlled, electrolytes and renal function are stable. -     CBC with Differential/Platelet; Future -     Comprehensive metabolic panel; Future -     Urinalysis, Routine w reflex microscopic (not at [facility]); Future  Hyperlipidemia with target LDL less than 100- he is achieved his LDL goal, statin therapy is not indicated. -     Lipid panel; Future -     TSH; Future  Type 2  diabetes mellitus with complication, without long-term current use of insulin ([place])- his A1c is at 6.1% on a medication so he is actually prediabetic, no medications are needed at this time, he agrees to work on his lifestyle modifications to lower his blood sugars. -     Comprehensive metabolic panel; Future -     Hemoglobin A1c; Future -     Microalbumin / creatinine urine ratio; Future -     Ambulatory referral to Ophthalmology  Thrombocytopenia [facility])- this has resolved -     CBC with Differential/Platelet; Future -     Hepatitis C antibody; Future  Benign prostatic hyperplasia without lower urinary tract symptoms- based on his symptoms and PSA level I'm not concerned about prostate cancer, he does not have any symptoms that need to be treated. -     PSA; Future  Cough- his chest x-rays normal -     DG Chest 2 View; Future  Systolic CHF, chronic ([place])- his digoxin level is undetectable, he will see cardiology soon and will inquire as to whether or not he needs to restart digoxin therapy, for now he has a normal volume status and appears well compensated. -     Digoxin level; Future  Moderate persistent asthma with acute exacerbation- he is having a moderately severe exacerbation so I gave him an injection of Depo-Medrol. I also think he should have his own nebulizer at home so I gave him an nebulizer and prescription for albuterol. Will also start a LABA/ICS combination to decrease his symptoms and inflammation. I gave him a sample of BREO and showed him how to use it, he demonstrated proficiency with its use. -     albuterol (PROVENTIL) (2.5 MG/3ML) 0.083% nebulizer solution; Take 3 mLs (2.5 mg total) by nebulization every 6 (six) hours as needed for wheezing or shortness of breath. -     fluticasone furoate-vilanterol (BREO ELLIPTA) 200-25 MCG/INH AEPB; Inhale 1 puff into the lungs daily. -     methylPREDNISolone acetate (DEPO-MEDROL) injection 120 mg; Inject 1.5 mLs (120 mg total) into  the muscle once.  Need for prophylactic vaccination and inoculation against influenza -     Flu Vaccine QUAD 36+ mos IM  Acute allergic rhinitis, unspecified seasonality, unspecified trigger -     methylPREDNISolone acetate (DEPO-MEDROL) injection 120 mg; Inject 1.5 mLs (120 mg total) into the muscle once.   I have discontinued Mr. Quizhpi's Chlorphen-PE-Acetaminophen (CORICIDIN D COLD/FLU/SINUS PO). I am also having him start on albuterol and fluticasone furoate-vilanterol. Additionally, I am having him maintain  his aspirin, sacubitril-valsartan, carvedilol, DIGITEK, spironolactone, furosemide, and simvastatin. We administered methylPREDNISolone acetate.  Meds ordered this encounter  Medications  . albuterol (PROVENTIL) (2.5 MG/3ML) 0.083% nebulizer solution    Sig: Take 3 mLs (2.5 mg total) by nebulization every 6 (six) hours as needed for wheezing or shortness of breath.    Dispense:  75 mL    Refill:  11  . fluticasone furoate-vilanterol (BREO ELLIPTA) 200-25 MCG/INH AEPB    Sig: Inhale 1 puff into the lungs daily.    Dispense:  30 each    Refill:  11  . methylPREDNISolone acetate (DEPO-MEDROL) injection 120 mg   See AVS for instructions about healthy living and anticipatory guidance.  Follow-up: Return in about 3 weeks (around 02/05/2016).  Scarlette Calico, MD

## 2016-01-15 NOTE — Progress Notes (Signed)
Pre visit review using our clinic review tool, if applicable. No additional management support is needed unless otherwise documented below in the visit note.

## 2016-01-15 NOTE — Patient Instructions (Signed)
Health Maintenance, Male A healthy lifestyle and preventative care can promote health and wellness.  Maintain regular health, dental, and eye exams.  Eat a healthy diet. Foods like vegetables, fruits, whole grains, low-fat dairy products, and lean protein foods contain the nutrients you need and are low in calories. Decrease your intake of foods high in solid fats, added sugars, and salt. Get information about a proper diet from your health care provider, if necessary.  Regular physical exercise is one of the most important things you can do for your health. Most adults should get at least 150 minutes of moderate-intensity exercise (any activity that increases your heart rate and causes you to sweat) each week. In addition, most adults need muscle-strengthening exercises on 2 or more days a week.   Maintain a healthy weight. The body mass index (BMI) is a screening tool to identify possible weight problems. It provides an estimate of body fat based on height and weight. Your health care provider can find your BMI and can help you achieve or maintain a healthy weight. For males 20 years and older:  A BMI below 18.5 is considered underweight.  A BMI of 18.5 to 24.9 is normal.  A BMI of 25 to 29.9 is considered overweight.  A BMI of 30 and above is considered obese.  Maintain normal blood lipids and cholesterol by exercising and minimizing your intake of saturated fat. Eat a balanced diet with plenty of fruits and vegetables. Blood tests for lipids and cholesterol should begin at age 75 and be repeated every 5 years. If your lipid or cholesterol levels are high, you are over age 18, or you are at high risk for heart disease, you may need your cholesterol levels checked more frequently.Ongoing high lipid and cholesterol levels should be treated with medicines if diet and exercise are not working.  If you smoke, find out from your health care provider how to quit. If you do not use tobacco, do not  start.  Lung cancer screening is recommended for adults aged 38-80 years who are at high risk for developing lung cancer because of a history of smoking. A yearly low-dose CT scan of the lungs is recommended for people who have at least a 30-pack-year history of smoking and are current smokers or have quit within the past 15 years. A pack year of smoking is smoking an average of 1 pack of cigarettes a day for 1 year (for example, a 30-pack-year history of smoking could mean smoking 1 pack a day for 30 years or 2 packs a day for 15 years). Yearly screening should continue until the smoker has stopped smoking for at least 15 years. Yearly screening should be stopped for people who develop a health problem that would prevent them from having lung cancer treatment.  If you choose to drink alcohol, do not have more than 2 drinks per day. One drink is considered to be 12 oz (360 mL) of beer, 5 oz (150 mL) of wine, or 1.5 oz (45 mL) of liquor.  Avoid the use of street drugs. Do not share needles with anyone. Ask for help if you need support or instructions about stopping the use of drugs.  High blood pressure causes heart disease and increases the risk of stroke. High blood pressure is more likely to develop in:  People who have blood pressure in the end of the normal range (100-139/85-89 mm Hg).  People who are overweight or obese.  People who are African American.  If you are 49-32 years of age, have your blood pressure checked every 3-5 years. If you are 68 years of age or older, have your blood pressure checked every year. You should have your blood pressure measured twice--once when you are at a hospital or clinic, and once when you are not at a hospital or clinic. Record the average of the two measurements. To check your blood pressure when you are not at a hospital or clinic, you can use:  An automated blood pressure machine at a pharmacy.  A home blood pressure monitor.  If you are 30-79 years  old, ask your health care provider if you should take aspirin to prevent heart disease.  Diabetes screening involves taking a blood sample to check your fasting blood sugar level. This should be done once every 3 years after age 52 if you are at a normal weight and without risk factors for diabetes. Testing should be considered at a younger age or be carried out more frequently if you are overweight and have at least 1 risk factor for diabetes.  Colorectal cancer can be detected and often prevented. Most routine colorectal cancer screening begins at the age of 18 and continues through age 66. However, your health care provider may recommend screening at an earlier age if you have risk factors for colon cancer. On a yearly basis, your health care provider may provide home test kits to check for hidden blood in the stool. A small camera at the end of a tube may be used to directly examine the colon (sigmoidoscopy or colonoscopy) to detect the earliest forms of colorectal cancer. Talk to your health care provider about this at age 56 when routine screening begins. A direct exam of the colon should be repeated every 5-10 years through age 59, unless early forms of precancerous polyps or small growths are found.  People who are at an increased risk for hepatitis B should be screened for this virus. You are considered at high risk for hepatitis B if:  You were born in a country where hepatitis B occurs often. Talk with your health care provider about which countries are considered high risk.  Your parents were born in a high-risk country and you have not received a shot to protect against hepatitis B (hepatitis B vaccine).  You have HIV or AIDS.  You use needles to inject street drugs.  You live with, or have sex with, someone who has hepatitis B.  You are a man who has sex with other men (MSM).  You get hemodialysis treatment.  You take certain medicines for conditions like cancer, organ  transplantation, and autoimmune conditions.  Hepatitis C blood testing is recommended for all people born from [birth year] through 1966 and any individual with known risk factors for hepatitis C.  Healthy men should no longer receive prostate-specific antigen (PSA) blood tests as part of routine cancer screening. Talk to your health care provider about prostate cancer screening.  Testicular cancer screening is not recommended for adolescents or adult males who have no symptoms. Screening includes self-exam, a health care provider exam, and other screening tests. Consult with your health care provider about any symptoms you have or any concerns you have about testicular cancer.  Practice safe sex. Use condoms and avoid high-risk sexual practices to reduce the spread of sexually transmitted infections (STIs).  You should be screened for STIs, including gonorrhea and chlamydia if:  You are sexually active and are younger than 24 years.  You  are older than 24 years, and your health care provider tells you that you are at risk for this type of infection.  Your sexual activity has changed since you were last screened, and you are at an increased risk for chlamydia or gonorrhea. Ask your health care provider if you are at risk.  If you are at risk of being infected with HIV, it is recommended that you take a prescription medicine daily to prevent HIV infection. This is called pre-exposure prophylaxis (PrEP). You are considered at risk if:  You are a man who has sex with other men (MSM).  You are a heterosexual man who is sexually active with multiple partners.  You take drugs by injection.  You are sexually active with a partner who has HIV.  Talk with your health care provider about whether you are at high risk of being infected with HIV. If you choose to begin PrEP, you should first be tested for HIV. You should then be tested every 3 months for as long as you are taking PrEP.  Use sunscreen. Apply  sunscreen liberally and repeatedly throughout the day. You should seek shade when your shadow is shorter than you. Protect yourself by wearing long sleeves, pants, a wide-brimmed hat, and sunglasses year round whenever you are outdoors.  Tell your health care provider of new moles or changes in moles, especially if there is a change in shape or color. Also, tell your health care provider if a mole is larger than the size of a pencil eraser.  A one-time screening for abdominal aortic aneurysm (AAA) and surgical repair of large AAAs by ultrasound is recommended for men aged 47-75 years who are current or former smokers.  Stay current with your vaccines (immunizations).   This information is not intended to replace advice given to you by your health care provider. Make sure you discuss any questions you have with your health care provider.   Document Released: 09/27/2007 Document Revised: 04/21/2014 Document Reviewed: 08/26/2010 Elsevier Interactive Patient Education [employer].

## 2016-01-16 ENCOUNTER — Encounter: Payer: Self-pay | Admitting: Internal Medicine

## 2016-01-16 DIAGNOSIS — E669 Obesity, unspecified: Secondary | ICD-10-CM | POA: Insufficient documentation

## 2016-01-16 DIAGNOSIS — IMO0001 Reserved for inherently not codable concepts without codable children: Secondary | ICD-10-CM | POA: Insufficient documentation

## 2016-01-16 LAB — HEPATITIS C ANTIBODY: HCV Ab: NEGATIVE

## 2016-01-16 LAB — DIGOXIN LEVEL

## 2016-01-16 NOTE — Assessment & Plan Note (Signed)
He agrees to work on his lifestyle modifications to lose weight.

## 2016-01-16 NOTE — Assessment & Plan Note (Signed)

## 2016-01-23 ENCOUNTER — Encounter: Payer: Self-pay | Admitting: Internal Medicine

## 2016-01-23 ENCOUNTER — Telehealth (HOSPITAL_COMMUNITY): Payer: Self-pay | Admitting: Vascular Surgery

## 2016-01-23 ENCOUNTER — Ambulatory Visit (INDEPENDENT_AMBULATORY_CARE_PROVIDER_SITE_OTHER): Payer: Medicare Other | Admitting: Internal Medicine

## 2016-01-23 VITALS — BP 120/78 | HR 91 | Ht 66.0 in | Wt 234.4 lb

## 2016-01-23 DIAGNOSIS — I5022 Chronic systolic (congestive) heart failure: Secondary | ICD-10-CM

## 2016-01-23 DIAGNOSIS — I428 Other cardiomyopathies: Secondary | ICD-10-CM | POA: Diagnosis not present

## 2016-01-23 DIAGNOSIS — Q210 Ventricular septal defect: Secondary | ICD-10-CM | POA: Diagnosis not present

## 2016-01-23 DIAGNOSIS — Z95810 Presence of automatic (implantable) cardiac defibrillator: Secondary | ICD-10-CM | POA: Diagnosis not present

## 2016-01-23 LAB — CUP PACEART INCLINIC DEVICE CHECK
Battery Remaining Longevity: 92.4
Brady Statistic RV Percent Paced: 0 %
HIGH POWER IMPEDANCE MEASURED VALUE: 78.75 Ohm
Implantable Lead Implant Date: 20161007
Implantable Lead Location: 753860
Lead Channel Setting Pacing Amplitude: 2.5 V
Lead Channel Setting Pacing Pulse Width: 0.5 ms
Lead Channel Setting Sensing Sensitivity: 0.5 mV
MDC IDC LEAD MODEL: 181
MDC IDC LEAD SERIAL: 331179
MDC IDC MSMT LEADCHNL RV IMPEDANCE VALUE: 425 Ohm
MDC IDC MSMT LEADCHNL RV PACING THRESHOLD AMPLITUDE: 0.75 V
MDC IDC MSMT LEADCHNL RV PACING THRESHOLD PULSEWIDTH: 0.5 ms
MDC IDC MSMT LEADCHNL RV SENSING INTR AMPL: 12 mV
MDC IDC SESS DTM: 20171011181219
Pulse Gen Serial Number: 7286706

## 2016-01-23 NOTE — Patient Instructions (Signed)
Medication Instructions: - Your physician recommends that you continue on your current medications as directed. Please refer to the Current Medication list given to you today.  Labwork: - none ordered  Procedures/Testing: - none ordered  Follow-Up: - Remote monitoring is used to monitor your Pacemaker of ICD from home. This monitoring reduces the number of office visits required to check your device to one time per year. It allows [place] to keep an eye on the functioning of your device to ensure it is working properly. You are scheduled for a device check from home on 04/23/16. You may send your transmission at any time that day. If you have a wireless device, the transmission will be sent automatically. After your physician reviews your transmission, you will receive a postcard with your next transmission date.  - Your physician recommends that you schedule a follow-up appointment in: 3 months with the CHF clinic  - Your physician wants you to follow-up in: 1 year with Chanetta Marshall, NP for Dr. Caryl Comes & 2 years with Dr. Caryl Comes. You will receive a reminder letter in the mail two months in advance. If you don't receive a letter, please call our office to schedule the follow-up appointment.  Any Additional Special Instructions Will Be Listed Below (If Applicable).     If you need a refill on your cardiac medications before your next appointment, please call your pharmacy.

## 2016-01-23 NOTE — Progress Notes (Signed)
Patient Care Team: [name], MD as PCP - General (Internal Medicine)   HPI  Kevin Cardenas is a 40 y.o. male Seen in followup for ICD implanted for primary prevention in setting of nonischemic cardiomyopathy with prior VSD repair    Ejection fraction 2009 was 35% or so ejection fraction most recently has remained in the 20-25% range.  He denies edema. There has been no significant shortness of breath. He has had no chest pain.  His ECG previously had demonstrated Q waves in leads 1, L, and V6 rendering him into the category of IVCD and hence unlikely to benefit from CRT   12/16  Cr 1.1  K4.9 10/17 Cr  0.94 K4.1 Dig <0.5    Past Medical History:  Diagnosis Date  . CHF (congestive heart failure) (HCC)    Systolic, EF 98% per echo 11/12  . Diabetes mellitus   . Hypertension   . Non-ischemic cardiomyopathy ([place])   . Obstructive sleep apnea   . VSD (ventricular septal defect)     Past Surgical History:  Procedure Laterality Date  . EP IMPLANTABLE DEVICE N/A 01/19/2015   Procedure: ICD Implant;  Surgeon: Deboraha Sprang, MD;  Location: [place] [facility];  Service: Cardiovascular;  Laterality: N/A;  . RIGHT HEART CATHETERIZATION N/A 08/03/2014   Procedure: RIGHT HEART CATH;  Surgeon: Larey Dresser, MD;  Location: [facility] CATH [facility];  Service: Cardiovascular;  Laterality: N/A;  . VSD REPAIR      Current Outpatient Prescriptions  Medication Sig Dispense Refill  . albuterol (PROVENTIL) (2.5 MG/3ML) 0.083% nebulizer solution Take 3 mLs (2.5 mg total) by nebulization every 6 (six) hours as needed for wheezing or shortness of breath. 75 mL 11  . aspirin 81 MG chewable tablet Chew 1 tablet (81 mg total) by mouth daily. 30 tablet 0  . carvedilol (COREG) 12.5 MG tablet Take one tab (12.15m ) in the AM and take one and one half tab (18.736m in the PM 90 tablet 3  . DIGITEK 125 MCG tablet TAKE 1 TABLET BY MOUTH DAILY 30 tablet PRN  . fluticasone furoate-vilanterol (BREO ELLIPTA)  200-25 MCG/INH AEPB Inhale 1 puff into the lungs daily. 30 each 11  . furosemide (LASIX) 40 MG tablet TAKE 1 TABLET BY MOUTH 2 TIMES DAILY. 60 tablet 2  . sacubitril-valsartan (ENTRESTO) 49-51 MG Take 1 tablet by mouth 2 (two) times daily. 60 tablet 11  . simvastatin (ZOCOR) 20 MG tablet TAKE 1 TABLET BY MOUTH ONCE DAILY AT BEDTIME 30 tablet 5  . spironolactone (ALDACTONE) 25 MG tablet TAKE 1 TABLET BY MOUTH ONCE DAILY 30 tablet PRN   No current facility-administered medications for this visit.     No Known Allergies    Review of Systems negative except from HPI and PMH  Physical Exam BP 120/78   Pulse 91   Ht _0  (1.676 m)   Wt 234 lb 6.4 oz (106.3 kg)   SpO2 95%   BMI 37.83 kg/m  Well developed and well nourished in no acute distress HENT normal E scleral and icterus clear Neck Supple Device pocket well healed; without hematoma or erythema.  There is no tethering  JVP flat; carotids brisk and full Clear to ausculation  Regular rate and rhythm, 2/6 systolic murmur Soft with active bowel sounds No clubbing cyanosis  Edema Alert and oriented, grossly normal motor and sensory function Skin Warm and Dry   ECG demonstrates sinus rhythm at 91 Intervals 17/13/39  IVCD  Assessment and  Plan  Nonischemic cardiomyopathy  Congestive heart failure-class II!  History of VSD repair  ICD St Jude   Euvolemic continue current meds  Guideline directed medical therapy  Reviewed device issues  We spent more than 50% of our >25 min visit in face to face counseling regarding the above

## 2016-01-23 NOTE — Telephone Encounter (Signed)
Left pt message to call back to make f/u appt

## 2016-02-05 ENCOUNTER — Ambulatory Visit (INDEPENDENT_AMBULATORY_CARE_PROVIDER_SITE_OTHER): Payer: Medicare Other | Admitting: Internal Medicine

## 2016-02-05 ENCOUNTER — Encounter: Payer: Self-pay | Admitting: Internal Medicine

## 2016-02-05 ENCOUNTER — Encounter (HOSPITAL_COMMUNITY): Payer: Self-pay | Admitting: Vascular Surgery

## 2016-02-05 VITALS — BP 122/82 | HR 98 | Temp 98.6°F | Ht 66.0 in | Wt 237.0 lb

## 2016-02-05 DIAGNOSIS — J4520 Mild intermittent asthma, uncomplicated: Secondary | ICD-10-CM

## 2016-02-05 DIAGNOSIS — I10 Essential (primary) hypertension: Secondary | ICD-10-CM | POA: Diagnosis not present

## 2016-02-05 DIAGNOSIS — R7303 Prediabetes: Secondary | ICD-10-CM | POA: Diagnosis not present

## 2016-02-05 NOTE — Progress Notes (Signed)
Pre visit review using our clinic review tool, if applicable. No additional management support is needed unless otherwise documented below in the visit note.

## 2016-02-05 NOTE — Patient Instructions (Signed)
Hypertension Hypertension, commonly called high blood pressure, is when the force of blood pumping through your arteries is too strong. Your arteries are the blood vessels that carry blood from your heart throughout your body. A blood pressure reading consists of a higher number over a lower number, such as 110/72. The higher number (systolic) is the pressure inside your arteries when your heart pumps. The lower number (diastolic) is the pressure inside your arteries when your heart relaxes. Ideally you want your blood pressure below 120/80. Hypertension forces your heart to work harder to pump blood. Your arteries may become narrow or stiff. Having untreated or uncontrolled hypertension can cause heart attack, stroke, kidney disease, and other problems. RISK FACTORS Some risk factors for high blood pressure are controllable. Others are not.  Risk factors you cannot control include:   Race. You may be at higher risk if you are African American.  Age. Risk increases with age.  Gender. Men are at higher risk than women before age 50 years. After age 25, women are at higher risk than men. Risk factors you can control include:  Not getting enough exercise or physical activity.  Being overweight.  Getting too much fat, sugar, calories, or salt in your diet.  Drinking too much alcohol. SIGNS AND SYMPTOMS Hypertension does not usually cause signs or symptoms. Extremely high blood pressure (hypertensive crisis) may cause headache, anxiety, shortness of breath, and nosebleed. DIAGNOSIS To check if you have hypertension, your health care provider will measure your blood pressure while you are seated, with your arm held at the level of your heart. It should be measured at least twice using the same arm. Certain conditions can cause a difference in blood pressure between your right and left arms. A blood pressure reading that is higher than normal on one occasion does not mean that you need treatment. If  it is not clear whether you have high blood pressure, you may be asked to return on a different day to have your blood pressure checked again. Or, you may be asked to monitor your blood pressure at home for 1 or more weeks. TREATMENT Treating high blood pressure includes making lifestyle changes and possibly taking medicine. Living a healthy lifestyle can help lower high blood pressure. You may need to change some of your habits. Lifestyle changes may include:  Following the DASH diet. This diet is high in fruits, vegetables, and whole grains. It is low in salt, red meat, and added sugars.  Keep your sodium intake below 2,300 mg per day.  Getting at least 30-45 minutes of aerobic exercise at least 4 times per week.  Losing weight if necessary.  Not smoking.  Limiting alcoholic beverages.  Learning ways to reduce stress. Your health care provider may prescribe medicine if lifestyle changes are not enough to get your blood pressure under control, and if one of the following is true:  You are 62-65 years of age and your systolic blood pressure is above 140.  You are 6 years of age or older, and your systolic blood pressure is above 150.  Your diastolic blood pressure is above 90.  You have diabetes, and your systolic blood pressure is over 160 or your diastolic blood pressure is over 90.  You have kidney disease and your blood pressure is above 140/90.  You have heart disease and your blood pressure is above 140/90. Your personal target blood pressure may vary depending on your medical conditions, your age, and other factors. HOME CARE INSTRUCTIONS  Have your blood pressure rechecked as directed by your health care provider.   Take medicines only as directed by your health care provider. Follow the directions carefully. Blood pressure medicines must be taken as prescribed. The medicine does not work as well when you skip doses. Skipping doses also puts you at risk for  problems.  Do not smoke.   Monitor your blood pressure at home as directed by your health care provider. SEEK MEDICAL CARE IF:   You think you are having a reaction to medicines taken.  You have recurrent headaches or feel dizzy.  You have swelling in your ankles.  You have trouble with your vision. SEEK IMMEDIATE MEDICAL CARE IF:  You develop a severe headache or confusion.  You have unusual weakness, numbness, or feel faint.  You have severe chest or abdominal pain.  You vomit repeatedly.  You have trouble breathing. MAKE SURE YOU:   Understand these instructions.  Will watch your condition.  Will get help right away if you are not doing well or get worse.   This information is not intended to replace advice given to you by your health care provider. Make sure you discuss any questions you have with your health care provider.   Document Released: 03/31/2005 Document Revised: 08/15/2014 Document Reviewed: 01/21/2013 Elsevier Interactive Patient Education [employer].

## 2016-02-05 NOTE — Progress Notes (Signed)
Subjective:  Patient ID: Kevin Cardenas, male    DOB: 07/14/1975  Age: 40 y.o. MRN: [id number]  CC: Hypertension and Congestive Heart Failure   HPI Kevin Cardenas presents for f/up. He feels well. His BP has been well controlled.  Outpatient Medications Prior to Visit  Medication Sig Dispense Refill  . albuterol (PROVENTIL) (2.5 MG/3ML) 0.083% nebulizer solution Take 3 mLs (2.5 mg total) by nebulization every 6 (six) hours as needed for wheezing or shortness of breath. 75 mL 11  . aspirin 81 MG chewable tablet Chew 1 tablet (81 mg total) by mouth daily. 30 tablet 0  . carvedilol (COREG) 12.5 MG tablet Take one tab (12.75m ) in the AM and take one and one half tab (18.710m in the PM 90 tablet 3  . DIGITEK 125 MCG tablet TAKE 1 TABLET BY MOUTH DAILY 30 tablet PRN  . fluticasone furoate-vilanterol (BREO ELLIPTA) 200-25 MCG/INH AEPB Inhale 1 puff into the lungs daily. 30 each 11  . furosemide (LASIX) 40 MG tablet TAKE 1 TABLET BY MOUTH 2 TIMES DAILY. 60 tablet 2  . sacubitril-valsartan (ENTRESTO) 49-51 MG Take 1 tablet by mouth 2 (two) times daily. 60 tablet 11  . simvastatin (ZOCOR) 20 MG tablet TAKE 1 TABLET BY MOUTH ONCE DAILY AT BEDTIME 30 tablet 5  . spironolactone (ALDACTONE) 25 MG tablet TAKE 1 TABLET BY MOUTH ONCE DAILY 30 tablet PRN   No facility-administered medications prior to visit.     ROS Review of Systems  Constitutional: Negative for chills, diaphoresis, fatigue and unexpected weight change.  HENT: Negative.   Eyes: Negative.   Respiratory: Negative for cough, chest tightness, shortness of breath, wheezing and stridor.   Cardiovascular: Negative for chest pain, palpitations and leg swelling.  Gastrointestinal: Negative for abdominal pain.  Endocrine: Negative.   Genitourinary: Negative.   Musculoskeletal: Negative.   Skin: Negative.   Allergic/Immunologic: Negative.   Neurological: Negative.  Negative for dizziness, syncope and light-headedness.  Hematological:  Negative.  Negative for adenopathy. Does not bruise/bleed easily.  Psychiatric/Behavioral: Negative.     Objective:  BP 122/82 (BP Location: Left Arm, Patient Position: Sitting, Cuff Size: Large)   Pulse 98   Temp 98.6 F (37 C) (Oral)   Ht _0  (1.676 m)   Wt 237 lb (107.5 kg)   SpO2 93%   BMI 38.25 kg/m   BP Readings from Last 3 Encounters:  02/05/16 122/82  01/23/16 120/78  01/15/16 124/82    Wt Readings from Last 3 Encounters:  02/05/16 237 lb (107.5 kg)  01/23/16 234 lb 6.4 oz (106.3 kg)  01/15/16 232 lb 6 oz (105.4 kg)    Physical Exam  Constitutional: He is oriented to person, place, and time. No distress.  HENT:  Mouth/Throat: Oropharynx is clear and moist. No oropharyngeal exudate.  Eyes: Conjunctivae are normal. Right eye exhibits no discharge. Left eye exhibits no discharge. No scleral icterus.  Neck: Normal range of motion. Neck supple. No JVD present. No tracheal deviation present. No thyromegaly present.  Cardiovascular: Normal rate, regular rhythm and intact distal pulses.  Exam reveals no gallop and no friction rub.   Murmur heard.  Systolic murmur is present with a grade of 2/6  Pulses:      Carotid pulses are 1+ on the right side, and 1+ on the left side.      Radial pulses are 1+ on the right side, and 1+ on the left side.       Femoral pulses are  1+ on the right side, and 1+ on the left side.      Popliteal pulses are 1+ on the right side, and 1+ on the left side.       Dorsalis pedis pulses are 1+ on the right side, and 1+ on the left side.       Posterior tibial pulses are 1+ on the right side, and 1+ on the left side.  Harsh 2/6 SEM heard everywhere  Pulmonary/Chest: Effort normal and breath sounds normal. No stridor. No respiratory distress. He has no wheezes. He has no rales. He exhibits no tenderness.  Abdominal: Soft. Bowel sounds are normal. He exhibits no distension and no mass. There is no tenderness. There is no rebound and no guarding.    Musculoskeletal: Normal range of motion. He exhibits no edema, tenderness or deformity.  Lymphadenopathy:    He has no cervical adenopathy.  Neurological: He is oriented to person, place, and time.  Skin: Skin is warm and dry. No rash noted. He is not diaphoretic. No erythema. No pallor.  Vitals reviewed.   Lab Results  Component Value Date   WBC 5.1 01/15/2016   HGB 14.4 01/15/2016   HCT 41.8 01/15/2016   PLT 168.0 01/15/2016   GLUCOSE 99 01/15/2016   CHOL 131 01/15/2016   TRIG 165.0 (H) 01/15/2016   HDL 49.40 01/15/2016   LDLCALC 48 01/15/2016   ALT 19 01/15/2016   AST 20 01/15/2016   NA 141 01/15/2016   K 4.1 01/15/2016   CL 103 01/15/2016   CREATININE 0.94 01/15/2016   BUN 14 01/15/2016   CO2 32 01/15/2016   TSH 1.92 01/15/2016   PSA 0.36 01/15/2016   INR 1.06 08/03/2014   HGBA1C 6.1 01/15/2016   MICROALBUR 0.8 01/15/2016    Dg Chest 2 View  Result Date: 01/16/2016 CLINICAL DATA:  Cough and congestion . EXAM: CHEST  2 VIEW COMPARISON:  01/20/2015 . FINDINGS: Cardiac pacer with lead tip over the right ventricle. Prior median sternotomy. Stable cardiomegaly. No pulmonary venous congestion. No focal pulmonary infiltrate. Left base pleural parenchymal thickening again noted consistent with scarring. No interim change . IMPRESSION: 1.  Cardiac pacer noted stable position.  Stable cardiomegaly. 2. No acute cardiopulmonary disease . Left base pleural parenchymal scarring . Electronically Signed   By: Marcello Moores  Register   On: 01/16/2016 08:24    Assessment & Plan:   [place] was seen today for hypertension and congestive heart failure.  Diagnoses and all orders for this visit:  Prediabetes- No medications are needed at this time, he will continue to work on his lifestyle modifications  Essential hypertension- his blood pressures well controlled and he has a normal volume status  Asthma, stable, mild intermittent- his symptoms are well controlled on the LABA/ICS  combination   I am having [name] maintain his aspirin, sacubitril-valsartan, carvedilol, DIGITEK, spironolactone, furosemide, simvastatin, albuterol, and fluticasone furoate-vilanterol.  No orders of the defined types were placed in this encounter.    Follow-up: Return in about 6 months (around 08/05/2016).  Scarlette Calico, MD

## 2016-02-14 MED FILL — DIGITEK 125 MCG TABLET: 125 | 30 days supply | Qty: 30 | Fill #2

## 2016-02-14 MED FILL — SIMVASTATIN 20 MG TABLET: 20 | 30 days supply | Qty: 30 | Fill #1

## 2016-02-14 MED FILL — FUROSEMIDE 40 MG TABLET: 40 | 30 days supply | Qty: 60 | Fill #1

## 2016-02-14 MED FILL — SPIRONOLACTONE 25 MG TABLET: 25 | 30 days supply | Qty: 30 | Fill #2

## 2016-02-15 ENCOUNTER — Other Ambulatory Visit (HOSPITAL_COMMUNITY): Payer: Self-pay | Admitting: *Deleted

## 2016-02-15 DIAGNOSIS — I5022 Chronic systolic (congestive) heart failure: Secondary | ICD-10-CM

## 2016-02-15 MED ORDER — CARVEDILOL 12.5 MG PO TABS: ORAL_TABLET | ORAL | 3 refills | Status: DC

## 2016-02-18 ENCOUNTER — Encounter (HOSPITAL_COMMUNITY): Payer: Self-pay

## 2016-02-18 ENCOUNTER — Ambulatory Visit (HOSPITAL_COMMUNITY)
Admission: RE | Admit: 2016-02-18 | Discharge: 2016-02-18 | Disposition: A | Discharge status: Home / Self Care | Payer: Medicare Other | Source: Ambulatory Visit | Attending: Internal Medicine | Admitting: Internal Medicine

## 2016-02-18 VITALS — BP 160/102 | HR 100 | Wt 233.5 lb

## 2016-02-18 DIAGNOSIS — Z95810 Presence of automatic (implantable) cardiac defibrillator: Secondary | ICD-10-CM | POA: Diagnosis not present

## 2016-02-18 DIAGNOSIS — Q210 Ventricular septal defect: Secondary | ICD-10-CM | POA: Diagnosis not present

## 2016-02-18 DIAGNOSIS — I110 Hypertensive heart disease with heart failure: Secondary | ICD-10-CM | POA: Diagnosis not present

## 2016-02-18 DIAGNOSIS — Z7982 Long term (current) use of aspirin: Secondary | ICD-10-CM | POA: Insufficient documentation

## 2016-02-18 DIAGNOSIS — I429 Cardiomyopathy, unspecified: Secondary | ICD-10-CM | POA: Diagnosis not present

## 2016-02-18 DIAGNOSIS — Z79899 Other long term (current) drug therapy: Secondary | ICD-10-CM | POA: Diagnosis not present

## 2016-02-18 DIAGNOSIS — I5022 Chronic systolic (congestive) heart failure: Secondary | ICD-10-CM | POA: Insufficient documentation

## 2016-02-18 DIAGNOSIS — I428 Other cardiomyopathies: Secondary | ICD-10-CM | POA: Diagnosis not present

## 2016-02-18 DIAGNOSIS — E119 Type 2 diabetes mellitus without complications: Secondary | ICD-10-CM | POA: Insufficient documentation

## 2016-02-18 DIAGNOSIS — Z9989 Dependence on other enabling machines and devices: Secondary | ICD-10-CM

## 2016-02-18 DIAGNOSIS — I10 Essential (primary) hypertension: Secondary | ICD-10-CM

## 2016-02-18 DIAGNOSIS — G4733 Obstructive sleep apnea (adult) (pediatric): Secondary | ICD-10-CM | POA: Insufficient documentation

## 2016-02-18 MED ORDER — SACUBITRIL-VALSARTAN 97-103 MG PO TABS: 1.0000 | ORAL_TABLET | Freq: Two times a day (BID) | ORAL | 6 refills | Status: DC

## 2016-02-18 MED ORDER — CARVEDILOL 12.5 MG PO TABS: 18.7500 mg | ORAL_TABLET | Freq: Two times a day (BID) | ORAL | 3 refills | Status: DC

## 2016-02-18 NOTE — Patient Instructions (Signed)
INCREASE Entresto to 97/103 mg, one tab twice a day INCREASE Coreg to 18.75 mg, one and one half tab twice a day  Labs and b/p check in 10 days  Your physician has requested that you have an echocardiogram. Echocardiography is a painless test that uses sound waves to create images of your heart. It provides your doctor with information about the size and shape of your heart and how well your heart's chambers and valves are working. This procedure takes approximately one hour. There are no restrictions for this procedure.  Your physician recommends that you schedule a follow-up appointment in: 4 weeks with Amy Cleg,,NP

## 2016-02-18 NOTE — Progress Notes (Signed)
Patient ID: Kevin Cardenas, male   DOB: 03/28/1976, 40 y.o.   MRN: [id number] PCP: Dr. Ronnald Ramp Cardiology: [name] EP: Dr Caryl Comes  Kevin Cardenas is a 40 y.o. gentlemen with history of VSD repair and removal of a pulmonary artery band at age 40, DM, HTN, systolic heart failure, NICM and OSA diagnosed in past but not on CPAP. Has St Jude ICD (placed 01/2015) .   Cardiac cath in April 2009 showed His LVEF was 35-40%. Coronary arteries were normal. RA mean 22 mmHg, RV 62/25, PA 59/39 mean 47 mmHg, mean pulmonary capillary wedge pressure 37 mmHg. Cardiac output was 6.21 with a cardiac index of 2.85 by FICK, and 4.95 and 2.27 respectively by thermodilution. There was no shunting at the atrial or ventricular level.   He returns for follow up.  He has not been seen in over a year. Overall feeling ok. Denies SOB/PND/Orthopnea. Mild dyspnea with steps. Scale not working. Using CPAP. Tries to follow low salt diet. Not working. Lives with his wife and mother. Requires assistance with transportation. Taking all medications.   Echo 9/11: LVEF 20-25%  Echo November 1st, 2012: LVEF 30-35%, diffuse hypokinesis and mild LVH. RV systolic mildly reduced. LA mildly dilated. Pulmonic valve with mild stenosis.  Echo 05/12/2014: EF 15-20% Grade IDD, LV moderately dilated.  RV normal. Mild pulmonc stenosis mean peak gradient 14.  Echo 7/16: EF 25-30%, mild LV dilation, mild LVH, trivial pulmonic stenosis with peak gradient 11 mmHg, mildly decreased RV function with mild RV dilation.    CPX (3/16): RER 1.03, peak VO2 19.9, VE/VCO2 slope 18, mild to moderately impaired functional capacity.  There was moderate to severe mixed restrictive/obstructive pattern on spirometry => exertional hypoxemia, oxygen saturation decreased to 84%.   RHC 08/03/2014  RA mean 9 RV 42/9 PA 35/12, mean 22 PCWP mean 8 Peak to peak gradient across pulmonic valve 7 mmHg, mean gradient 6 mmHg.  Oxygen saturations: RA 56% RV 60% PA 61% AO 93% Cardiac  Output (Fick) 3.44  Cardiac Index (Fick) 1.77 Cardiac Output (Thermo) 4.73 Cardiac Index (Thermo) 2.44  Labs (1/16): K 4.1, creatinine 0.9, TSH normal, LDL 89, HDL 46 Labs (05/24/2014): K 4.1 Creatinine 0.97  Labs (3/16): BNP 54, K 4.4, creatinine 1.0 Labs 08/14/2014: K 4.0 Creatinine 0.93  Labs (6/16): digoxin < 0.2 Labs (9/16): K 3.7, creatinine 0.93, digoxin < 0.5, HCT 40.4 Labs (01/2016): dig 0.5 K 4.1 Creatinine 0.94   ROS: All systems negative except as listed in HPI, PMH and Problem List.  Past Medical History:  Diagnosis Date  . CHF (congestive heart failure) (HCC)    Systolic, EF 95% per echo 11/12  . Diabetes mellitus   . Hypertension   . Non-ischemic cardiomyopathy ([place])   . Obstructive sleep apnea   . VSD (ventricular septal defect)     Current Outpatient Prescriptions  Medication Sig Dispense Refill  . albuterol (PROVENTIL) (2.5 MG/3ML) 0.083% nebulizer solution Take 3 mLs (2.5 mg total) by nebulization every 6 (six) hours as needed for wheezing or shortness of breath. 75 mL 11  . aspirin 81 MG chewable tablet Chew 1 tablet (81 mg total) by mouth daily. 30 tablet 0  . carvedilol (COREG) 12.5 MG tablet Take one tab (12.6m ) in the AM and take one and one half tab (18.721m in the PM 90 tablet 3  . DIGITEK 125 MCG tablet TAKE 1 TABLET BY MOUTH DAILY 30 tablet PRN  . fluticasone furoate-vilanterol (BREO ELLIPTA) 200-25 MCG/INH AEPB Inhale 1  puff into the lungs daily. 30 each 11  . furosemide (LASIX) 40 MG tablet TAKE 1 TABLET BY MOUTH 2 TIMES DAILY. 60 tablet 2  . sacubitril-valsartan (ENTRESTO) 49-51 MG Take 1 tablet by mouth 2 (two) times daily. 60 tablet 11  . simvastatin (ZOCOR) 20 MG tablet TAKE 1 TABLET BY MOUTH ONCE DAILY AT BEDTIME 30 tablet 5  . spironolactone (ALDACTONE) 25 MG tablet TAKE 1 TABLET BY MOUTH ONCE DAILY 30 tablet PRN   No current facility-administered medications for this encounter.     PHYSICAL EXAM: Vitals:   02/18/16 1438  BP: (!)  160/102  Pulse: 100  SpO2: 95%  Weight: 233 lb 8 oz (105.9 kg)    General: Well appearing. No respiratory difficulty . Ambulated in the clinic without difficulty.  HEENT: normal  Neck: Thick. JVP not elevated. Carotids 2+ bilat; no bruits. No lymphadenopathy or thyromegaly appreciated.  Cor: PMI nondisplaced. Regular rate & rhythm with widely split S2. No rubs, gallops.  2/6 SEM LUSB. L upper chest scar from ICD.  Lungs: Clear Abdomen: soft, nontender,nondistended. No hepatosplenomegaly. No bruits or masses. Good bowel sounds.  Extremities: no cyanosis, clubbing, rash, no edema.   Neuro: alert & oriented x 3, cranial nerves grossly intact. moves all 4 extremities w/o difficulty. Affect pleasant.   ASSESSMENT & PLAN: 1. Chronic systolic CHF: Nonischemic cardiomyopathy.  CPX in 3/16 showed a mild to moderate functional limitation, but PFTs were quite abnormal with a mixed restrictive/obstructive picture. RHC in 4/16 showed preserved cardiac index by thermodilution (low by Fick).   Echo in 7/16 showed EF 25-30% with mildly dilated and mildly dysfunctional RV.  He has a [employer], political [employer] ICD (IVCD on ECG, not thought to be likely to benefit from CRT).   NYHA class II symptoms, volume status stable.    - Continue Lasix 40 mg bid and continue  25 mg spironolactone daily. Recent BMET ok on 01/15/2016.  - Increase carvedilol to 18.75 mg twice a day.   -Increase entresto to 97-103 mg twice a day. BMET in 10 days.  -Repeat ECHO 2. H/o VSD: No residual shunt noted on 7/16 echo.  Last RHC also did not suggest any significant left to right shunting.  3. Pulmonary stenosis: Very mild by 7/16 echo.  4. OSA: Encouraged to use CPAP nightly.     5. Abnormal PFTs: Pulmonary following.   6. HTN: As above increase carvedilol and entresto.   Follow up in  10 days for BMET and BP check . Repeat ECHO. I have asked him to bring all medications to his next appointment.   Follow up in 4 weeks.    Kevin Volland,NP-C  02/18/2016

## 2016-02-28 ENCOUNTER — Ambulatory Visit (HOSPITAL_COMMUNITY)
Admission: RE | Admit: 2016-02-28 | Discharge: 2016-02-28 | Disposition: A | Discharge status: Home / Self Care | Payer: Medicare Other | Source: Ambulatory Visit | Attending: Internal Medicine | Admitting: Internal Medicine

## 2016-02-28 DIAGNOSIS — I5022 Chronic systolic (congestive) heart failure: Secondary | ICD-10-CM

## 2016-02-28 LAB — BASIC METABOLIC PANEL
ANION GAP: 8 (ref 5–15)
BUN: 15 mg/dL (ref 6–20)
CALCIUM: 9.1 mg/dL (ref 8.9–10.3)
CO2: 27 mmol/L (ref 22–32)
Chloride: 104 mmol/L (ref 101–111)
Creatinine, Ser: 0.99 mg/dL (ref 0.61–1.24)
Glucose, Bld: 130 mg/dL — ABNORMAL HIGH (ref 65–99)
Potassium: 3.6 mmol/L (ref 3.5–5.1)
Sodium: 139 mmol/L (ref 135–145)

## 2016-02-28 NOTE — Addendum Note (Signed)
Encounter addended by: Kerry Dory, CMA on: 02/28/2016  2:50 PM<BR>    Actions taken: Vitals modified

## 2016-02-28 NOTE — Progress Notes (Signed)
Patient present for blood pressure check. Patient reports he was able to get all meds without any trouble, he did increased coreg to 18.75 mg BID and entresto to 97/103 BID.  DENIES: SOB, chest pain, dizziness  bmet done today  Advised to continue all current meds as prescribed and return for follow up as scheduled   Current Outpatient Prescriptions on File Prior to Encounter  Medication Sig Dispense Refill  . albuterol (PROVENTIL) (2.5 MG/3ML) 0.083% nebulizer solution Take 3 mLs (2.5 mg total) by nebulization every 6 (six) hours as needed for wheezing or shortness of breath. 75 mL 11  . aspirin 81 MG chewable tablet Chew 1 tablet (81 mg total) by mouth daily. 30 tablet 0  . carvedilol (COREG) 12.5 MG tablet Take 1.5 tablets (18.75 mg total) by mouth 2 (two) times daily with a meal. 90 tablet 3  . DIGITEK 125 MCG tablet TAKE 1 TABLET BY MOUTH DAILY 30 tablet PRN  . fluticasone furoate-vilanterol (BREO ELLIPTA) 200-25 MCG/INH AEPB Inhale 1 puff into the lungs daily. 30 each 11  . furosemide (LASIX) 40 MG tablet TAKE 1 TABLET BY MOUTH 2 TIMES DAILY. 60 tablet 2  . sacubitril-valsartan (ENTRESTO) 97-103 MG Take 1 tablet by mouth 2 (two) times daily. 60 tablet 6  . simvastatin (ZOCOR) 20 MG tablet TAKE 1 TABLET BY MOUTH ONCE DAILY AT BEDTIME 30 tablet 5  . spironolactone (ALDACTONE) 25 MG tablet TAKE 1 TABLET BY MOUTH ONCE DAILY 30 tablet PRN   No current facility-administered medications on file prior to encounter.

## 2016-02-28 NOTE — Addendum Note (Signed)
Encounter addended by: Kerry Dory, CMA on: 02/28/2016  2:49 PM<BR>    Actions taken: Vitals modified, Sign clinical note

## 2016-03-03 ENCOUNTER — Other Ambulatory Visit (HOSPITAL_COMMUNITY): Payer: Self-pay | Admitting: Cardiology

## 2016-03-03 ENCOUNTER — Ambulatory Visit (HOSPITAL_COMMUNITY)
Admission: RE | Admit: 2016-03-03 | Discharge: 2016-03-03 | Disposition: A | Discharge status: Home / Self Care | Payer: Medicare Other | Source: Ambulatory Visit | Attending: Internal Medicine | Admitting: Internal Medicine

## 2016-03-03 ENCOUNTER — Telehealth (HOSPITAL_COMMUNITY): Payer: Self-pay | Admitting: Pharmacist

## 2016-03-03 DIAGNOSIS — I509 Heart failure, unspecified: Secondary | ICD-10-CM | POA: Diagnosis not present

## 2016-03-03 NOTE — Telephone Encounter (Signed)
Entresto 97/103 mg BID PA approved by [place] Part D/Medicaid. Verified coverage with Outpatient Pharmacy.   Ruta Hinds. Velva Harman, PharmD, BCPS, CPP Clinical Pharmacist Pager: [phone] Phone: [phone] 03/03/2016 11:35 AM

## 2016-03-03 NOTE — Progress Notes (Signed)
*  PRELIMINARY RESULTS* Echocardiogram 2D Echocardiogram has been performed.  Kevin Cardenas 03/03/2016, 3:01 PM

## 2016-03-17 ENCOUNTER — Ambulatory Visit (HOSPITAL_COMMUNITY)
Admission: RE | Admit: 2016-03-17 | Discharge: 2016-03-17 | Disposition: A | Discharge status: Home / Self Care | Payer: Medicare Other | Source: Ambulatory Visit | Attending: Internal Medicine | Admitting: Internal Medicine

## 2016-03-17 VITALS — BP 150/100 | HR 112 | Wt 234.6 lb

## 2016-03-17 DIAGNOSIS — Z95810 Presence of automatic (implantable) cardiac defibrillator: Secondary | ICD-10-CM | POA: Diagnosis not present

## 2016-03-17 DIAGNOSIS — E119 Type 2 diabetes mellitus without complications: Secondary | ICD-10-CM | POA: Insufficient documentation

## 2016-03-17 DIAGNOSIS — I10 Essential (primary) hypertension: Secondary | ICD-10-CM

## 2016-03-17 DIAGNOSIS — I429 Cardiomyopathy, unspecified: Secondary | ICD-10-CM | POA: Insufficient documentation

## 2016-03-17 DIAGNOSIS — Z79899 Other long term (current) drug therapy: Secondary | ICD-10-CM | POA: Insufficient documentation

## 2016-03-17 DIAGNOSIS — I110 Hypertensive heart disease with heart failure: Secondary | ICD-10-CM | POA: Diagnosis not present

## 2016-03-17 DIAGNOSIS — Z7982 Long term (current) use of aspirin: Secondary | ICD-10-CM | POA: Insufficient documentation

## 2016-03-17 DIAGNOSIS — Q210 Ventricular septal defect: Secondary | ICD-10-CM

## 2016-03-17 DIAGNOSIS — I5022 Chronic systolic (congestive) heart failure: Secondary | ICD-10-CM

## 2016-03-17 DIAGNOSIS — I5042 Chronic combined systolic (congestive) and diastolic (congestive) heart failure: Secondary | ICD-10-CM | POA: Insufficient documentation

## 2016-03-17 DIAGNOSIS — I428 Other cardiomyopathies: Secondary | ICD-10-CM

## 2016-03-17 DIAGNOSIS — G4733 Obstructive sleep apnea (adult) (pediatric): Secondary | ICD-10-CM

## 2016-03-17 DIAGNOSIS — Z9989 Dependence on other enabling machines and devices: Secondary | ICD-10-CM

## 2016-03-17 MED ORDER — CARVEDILOL 25 MG PO TABS: 25.0000 mg | ORAL_TABLET | Freq: Two times a day (BID) | ORAL | 3 refills | Status: DC

## 2016-03-17 MED ORDER — SACUBITRIL-VALSARTAN 97-103 MG PO TABS: 1.0000 | ORAL_TABLET | Freq: Two times a day (BID) | ORAL | 3 refills | Status: DC

## 2016-03-17 MED FILL — CARVEDILOL 25 MG TABLET: 25 | 30 days supply | Qty: 60 | Fill #0

## 2016-03-17 MED FILL — SPIRONOLACTONE 25 MG TABLET: 25 | 30 days supply | Qty: 30 | Fill #3

## 2016-03-17 MED FILL — FUROSEMIDE 40 MG TABLET: 40 | 30 days supply | Qty: 60 | Fill #2

## 2016-03-17 MED FILL — DIGITEK 125 MCG TABLET: 125 | 30 days supply | Qty: 30 | Fill #3

## 2016-03-17 MED FILL — ENTRESTO 97 MG-103 MG TAB: 97-103 | 30 days supply | Qty: 60 | Fill #0

## 2016-03-17 MED FILL — SIMVASTATIN 20 MG TABLET: 20 | 30 days supply | Qty: 30 | Fill #2

## 2016-03-17 NOTE — Progress Notes (Signed)
Patient ID: Kevin Cardenas, male   DOB: 08/26/1975, 40 y.o.   MRN: [id number] PCP: Dr. Ronnald Ramp Cardiology: [name] EP: Dr Caryl Comes  Kevin Cardenas is a 40 y.o. gentlemenen with history of VSD repair and removal of a pulmonary artery band at age 40, DM, HTN, systolic heart failure, NICM and OSA diagnosed in past but not on CPAP. Has St Jude ICD (placed 01/2015) .   Cardiac cath in April 2009 showed His LVEF was 35-40%. Coronary arteries were normal. RA mean 22 mmHg, RV 62/25, PA 59/39 mean 47 mmHg, mean pulmonary capillary wedge pressure 37 mmHg. Cardiac output was 6.21 with a cardiac index of 2.85 by FICK, and 4.95 and 2.27 respectively by thermodilution. There was no shunting at the atrial or ventricular level.   He returns for follow up.  Last visit he was supposed to increase entresto to 97-103 twice a day but he was confused about medication coverage so he didn't pick it up. Overall feeling ok. Denies SOB/PND/Orthopnea.  Using CPAP. Tries to follow low salt diet. Not working. Lives with his wife and mother. Requires assistance with transportation. Taking all medications.   Echo 9/11: LVEF 20-25%  Echo November 1st, 2012: LVEF 30-35%, diffuse hypokinesis and mild LVH. RV systolic mildly reduced. LA mildly dilated. Pulmonic valve with mild stenosis.  Echo 05/12/2014: EF 15-20% Grade IDD, LV moderately dilated.  RV normal. Mild pulmonc stenosis mean peak gradient 14.  Echo 7/16: EF 25-30%, mild LV dilation, mild LVH, trivial pulmonic stenosis with peak gradient 11 mmHg, mildly decreased RV function with mild RV dilation.    CPX (3/16): RER 1.03, peak VO2 19.9, VE/VCO2 slope 18, mild to moderately impaired functional capacity.  There was moderate to severe mixed restrictive/obstructive pattern on spirometry => exertional hypoxemia, oxygen saturation decreased to 84%.   RHC 08/03/2014  RA mean 9 RV 42/9 PA 35/12, mean 22 PCWP mean 8 Peak to peak gradient across pulmonic valve 7 mmHg, mean gradient 6 mmHg.   Oxygen saturations: RA 56% RV 60% PA 61% AO 93% Cardiac Output (Fick) 3.44  Cardiac Index (Fick) 1.77 Cardiac Output (Thermo) 4.73 Cardiac Index (Thermo) 2.44  Labs (1/16): K 4.1, creatinine 0.9, TSH normal, LDL 89, HDL 46 Labs (05/24/2014): K 4.1 Creatinine 0.97  Labs (3/16): BNP 54, K 4.4, creatinine 1.0 Labs 08/14/2014: K 4.0 Creatinine 0.93  Labs (6/16): digoxin < 0.2 Labs (9/16): K 3.7, creatinine 0.93, digoxin < 0.5, HCT 40.4 Labs (01/2016): dig 0.5 K 4.1 Creatinine 0.94   ROS: All systems negative except as listed in HPI, PMH and Problem List.  Past Medical History:  Diagnosis Date  . CHF (congestive heart failure) (HCC)    Systolic, EF 10% per echo 11/12  . Diabetes mellitus   . Hypertension   . Non-ischemic cardiomyopathy ([place])   . Obstructive sleep apnea   . VSD (ventricular septal defect)     Current Outpatient Prescriptions  Medication Sig Dispense Refill  . albuterol (PROVENTIL) (2.5 MG/3ML) 0.083% nebulizer solution Take 3 mLs (2.5 mg total) by nebulization every 6 (six) hours as needed for wheezing or shortness of breath. 75 mL 11  . aspirin 81 MG chewable tablet Chew 1 tablet (81 mg total) by mouth daily. 30 tablet 0  . carvedilol (COREG) 12.5 MG tablet Take 1.5 tablets (18.75 mg total) by mouth 2 (two) times daily with a meal. 90 tablet 3  . DIGITEK 125 MCG tablet TAKE 1 TABLET BY MOUTH DAILY 30 tablet PRN  . fluticasone furoate-vilanterol (  BREO ELLIPTA) 200-25 MCG/INH AEPB Inhale 1 puff into the lungs daily. 30 each 11  . furosemide (LASIX) 40 MG tablet TAKE 1 TABLET BY MOUTH 2 TIMES DAILY. 60 tablet 2  . sacubitril-valsartan (ENTRESTO) 49-51 MG Take 1 tablet by mouth 2 (two) times daily.    . simvastatin (ZOCOR) 20 MG tablet TAKE 1 TABLET BY MOUTH ONCE DAILY AT BEDTIME 30 tablet 5  . spironolactone (ALDACTONE) 25 MG tablet TAKE 1 TABLET BY MOUTH ONCE DAILY 30 tablet PRN   No current facility-administered medications for this encounter.     PHYSICAL  EXAM: Vitals:   03/17/16 1426  BP: (!) 150/100  BP Location: Left Arm  Patient Position: Sitting  Cuff Size: Normal  Pulse: (!) 112  SpO2: 92%  Weight: 234 lb 9.6 oz (106.4 kg)   Filed Weights   03/17/16 1426  Weight: 234 lb 9.6 oz (106.4 kg)   General: Well appearing. No respiratory difficulty . Ambulated in the clinic without difficulty.  HEENT: normal  Neck: Thick. JVP 7-8. Carotids 2+ bilat; no bruits. No lymphadenopathy or thyromegaly appreciated.  Cor: PMI nondisplaced. Regular rate & rhythm with widely split S2. No rubs, gallops.  2/6 SEM LUSB. L upper chest scar from ICD.  Lungs: Clear Abdomen: soft, nontender,nondistended. No hepatosplenomegaly. No bruits or masses. Good bowel sounds.  Extremities: no cyanosis, clubbing, rash, no edema.   Neuro: alert & oriented x 3, cranial nerves grossly intact. moves all 4 extremities w/o difficulty. Affect pleasant.   ASSESSMENT & PLAN: 1. Chronic systolic/diastolic  CHF: Nonischemic cardiomyopathy.  CPX in 3/16 showed a mild to moderate functional limitation, but PFTs were quite abnormal with a mixed restrictive/obstructive picture. RHC in 4/16 showed preserved cardiac index by thermodilution (low by Fick).   Echo in 7/16 showed EF 25-30% with mildly dilated and mildly dysfunctional RV.  Most recent ECHO 02/2016 Ef 30-35%.  He has a [employer], political party ICD (IVCD on ECG, not thought to be likely to benefit from CRT).   NYHA class II symptoms, volume status stable. Continue Lasix 40 mg bid and continue  25 mg spironolactone daily.  - Increase carvedilol to 25 mg twice a day.   -Increase entresto to 97-103 mg twice a day again today. Marland Kitchen BMET in 10 days. Reinforced medication compliance.   2. H/o VSD: No residual shunt noted on 7/16 echo.  Last RHC also did not suggest any significant left to right shunting.  3. Pulmonary stenosis: Very mild by 7/16 echo.  4. OSA: Encouraged to use CPAP nightly.     5. Abnormal PFTs: Pulmonary following.   6. HTN: As  above increase carvedilol and entresto.   BMET in 10 days.   Follow up in 8 weeks.    Edahi Kroening,NP-C  03/17/2016

## 2016-03-17 NOTE — Patient Instructions (Signed)
INCREASE Entresto to 97/103 mg, one tab twice a day INCREASE Coreg to 25 mg, one tab twice a day   Labs needed in 10-14 days  Your physician recommends that you schedule a follow-up appointment in: 8 weeks with Amy Clegg,NP  Do the following things EVERYDAY: 1) Weigh yourself in the morning before breakfast. Write it down and keep it in a log. 2) Take your medicines as prescribed 3) Eat low salt foods-Limit salt (sodium) to 2000 mg per day.  4) Stay as active as you can everyday 5) Limit all fluids for the day to less than 2 liters

## 2016-03-17 NOTE — Progress Notes (Signed)
Advanced Heart Failure Medication Review by a Pharmacist  Does the patient  feel that his/her medications are working for him/her?  yes  Has the patient been experiencing any side effects to the medications prescribed?  no  Does the patient measure his/her own blood pressure or blood glucose at home?  no   Does the patient have any problems obtaining medications due to transportation or finances?   no  Understanding of regimen: fair Understanding of indications: fair Potential of compliance: good Patient understands to avoid NSAIDs. Patient understands to avoid decongestants.  Issues to address at subsequent visits: None   Pharmacist comments:  Kevin Cardenas is a pleasant 40 yo M presenting with a current medication list. He reports good compliance with his regimen but states that he has not picked up the higher dose of Entresto 97/103 mg BID so he has still been using the 49-51 mg BID dose. No other medication-related questions or concerns for me at this time.   Ruta Hinds. Velva Harman, PharmD, BCPS, CPP Clinical Pharmacist Pager: [phone] Phone: [phone] 03/17/2016 2:37 PM      Time with patient: 10 minutes Preparation and documentation time: 2 minutes Total time: 12 minutes

## 2016-03-21 DIAGNOSIS — Z0100 Encounter for examination of eyes and vision without abnormal findings: Secondary | ICD-10-CM | POA: Diagnosis not present

## 2016-03-21 DIAGNOSIS — E119 Type 2 diabetes mellitus without complications: Secondary | ICD-10-CM | POA: Diagnosis not present

## 2016-03-21 LAB — HM DIABETES EYE EXAM

## 2016-03-27 ENCOUNTER — Ambulatory Visit (HOSPITAL_COMMUNITY)
Admission: RE | Admit: 2016-03-27 | Discharge: 2016-03-27 | Disposition: A | Discharge status: Home / Self Care | Payer: Medicare Other | Source: Ambulatory Visit | Attending: Internal Medicine | Admitting: Internal Medicine

## 2016-03-27 DIAGNOSIS — I5022 Chronic systolic (congestive) heart failure: Secondary | ICD-10-CM | POA: Insufficient documentation

## 2016-03-27 LAB — BASIC METABOLIC PANEL
ANION GAP: 9 (ref 5–15)
BUN: 10 mg/dL (ref 6–20)
CALCIUM: 9.1 mg/dL (ref 8.9–10.3)
CO2: 27 mmol/L (ref 22–32)
Chloride: 105 mmol/L (ref 101–111)
Creatinine, Ser: 1.14 mg/dL (ref 0.61–1.24)
GLUCOSE: 118 mg/dL — AB (ref 65–99)
POTASSIUM: 4 mmol/L (ref 3.5–5.1)
Sodium: 141 mmol/L (ref 135–145)

## 2016-04-01 ENCOUNTER — Encounter: Payer: Self-pay | Admitting: Internal Medicine

## 2016-04-23 ENCOUNTER — Ambulatory Visit (INDEPENDENT_AMBULATORY_CARE_PROVIDER_SITE_OTHER): Payer: Medicare Other | Admitting: *Deleted

## 2016-04-23 DIAGNOSIS — I428 Other cardiomyopathies: Secondary | ICD-10-CM

## 2016-04-23 DIAGNOSIS — I5022 Chronic systolic (congestive) heart failure: Secondary | ICD-10-CM

## 2016-04-23 NOTE — Progress Notes (Signed)
Remote ICD transmission.   

## 2016-04-24 ENCOUNTER — Encounter: Payer: Self-pay | Admitting: Cardiology

## 2016-05-08 LAB — CUP PACEART REMOTE DEVICE CHECK
Battery Remaining Longevity: 89 mo
Brady Statistic RV Percent Paced: 1 % — CL
Date Time Interrogation Session: 20180125151103
HIGH POWER IMPEDANCE MEASURED VALUE: 79 Ohm
Implantable Lead Implant Date: 20161007
Implantable Lead Location: 753860
Implantable Lead Serial Number: 331179
MDC IDC MSMT LEADCHNL RV IMPEDANCE VALUE: 430 Ohm
MDC IDC PG IMPLANT DT: 20161007
MDC IDC SET LEADCHNL RV PACING AMPLITUDE: 2.5 V
MDC IDC SET LEADCHNL RV PACING PULSEWIDTH: 0.5 ms
MDC IDC SET LEADCHNL RV SENSING SENSITIVITY: 0.5 mV
Pulse Gen Serial Number: 7286706

## 2016-05-12 ENCOUNTER — Inpatient Hospital Stay (HOSPITAL_COMMUNITY): Admission: RE | Admit: 2016-05-12 | Payer: Medicare Other | Source: Ambulatory Visit

## 2016-07-23 ENCOUNTER — Telehealth: Payer: Self-pay | Admitting: Cardiology

## 2016-07-23 ENCOUNTER — Ambulatory Visit (INDEPENDENT_AMBULATORY_CARE_PROVIDER_SITE_OTHER): Payer: Medicare HMO | Admitting: *Deleted

## 2016-07-23 DIAGNOSIS — I428 Other cardiomyopathies: Secondary | ICD-10-CM | POA: Diagnosis not present

## 2016-07-23 DIAGNOSIS — I5022 Chronic systolic (congestive) heart failure: Secondary | ICD-10-CM

## 2016-07-23 NOTE — Telephone Encounter (Signed)
LMOVM reminding pt to send remote transmission.   

## 2016-07-23 NOTE — Progress Notes (Signed)
Remote ICD transmission.

## 2016-07-24 ENCOUNTER — Encounter: Payer: Self-pay | Admitting: Cardiology

## 2016-07-25 LAB — CUP PACEART REMOTE DEVICE CHECK
Battery Remaining Longevity: 88 mo
Battery Remaining Percentage: 84 %
Battery Voltage: 3.04 V
Brady Statistic RV Percent Paced: 1 %
Date Time Interrogation Session: 20180411141706
HIGH POWER IMPEDANCE MEASURED VALUE: 73 Ohm
HIGH POWER IMPEDANCE MEASURED VALUE: 73 Ohm
Implantable Lead Implant Date: 20161007
Implantable Lead Model: 181
Implantable Lead Serial Number: 331179
Lead Channel Impedance Value: 410 Ohm
Lead Channel Pacing Threshold Amplitude: 0.75 V
Lead Channel Sensing Intrinsic Amplitude: 10.5 mV
MDC IDC LEAD LOCATION: 753860
MDC IDC MSMT LEADCHNL RV PACING THRESHOLD PULSEWIDTH: 0.5 ms
MDC IDC PG IMPLANT DT: 20161007
MDC IDC PG SERIAL: 7286706
MDC IDC SET LEADCHNL RV PACING AMPLITUDE: 2.5 V
MDC IDC SET LEADCHNL RV PACING PULSEWIDTH: 0.5 ms
MDC IDC SET LEADCHNL RV SENSING SENSITIVITY: 0.5 mV

## 2016-08-13 ENCOUNTER — Other Ambulatory Visit (HOSPITAL_COMMUNITY): Payer: Self-pay | Admitting: Adult Health

## 2016-08-13 MED FILL — SIMVASTATIN 20 MG TABLET: 20 | 30 days supply | Qty: 30 | Fill #3

## 2016-08-13 MED FILL — SPIRONOLACTONE 25 MG TABLET: 25 | 30 days supply | Qty: 30 | Fill #4

## 2016-08-13 MED FILL — CARVEDILOL 25 MG TABLET: 25 | 30 days supply | Qty: 60 | Fill #1

## 2016-08-13 MED FILL — DIGOXIN 0.125 MG TABLET: 125 | 30 days supply | Qty: 30 | Fill #4

## 2016-08-14 MED FILL — FUROSEMIDE 40 MG TABLET: 40 | 30 days supply | Qty: 60 | Fill #0

## 2016-08-18 ENCOUNTER — Telehealth (HOSPITAL_COMMUNITY): Payer: Self-pay | Admitting: Pharmacist

## 2016-08-18 NOTE — Telephone Encounter (Signed)
Entresto 97-103 mg BID PA approved by [employer] Part D through 08/18/18.   Ruta Hinds. Velva Harman, PharmD, BCPS, CPP Clinical Pharmacist Pager: [phone] Phone: [phone] 08/18/2016 12:38 PM

## 2016-09-05 MED FILL — ENTRESTO 97 MG-103 MG TAB: 97-103 | 30 days supply | Qty: 60 | Fill #0

## 2016-10-22 ENCOUNTER — Ambulatory Visit (INDEPENDENT_AMBULATORY_CARE_PROVIDER_SITE_OTHER): Payer: Medicare HMO | Admitting: *Deleted

## 2016-10-22 DIAGNOSIS — I428 Other cardiomyopathies: Secondary | ICD-10-CM | POA: Diagnosis not present

## 2016-10-22 DIAGNOSIS — I5022 Chronic systolic (congestive) heart failure: Secondary | ICD-10-CM

## 2016-10-22 NOTE — Progress Notes (Signed)
Remote ICD transmission.   

## 2016-10-28 ENCOUNTER — Encounter: Payer: Self-pay | Admitting: Cardiology

## 2016-11-10 LAB — CUP PACEART REMOTE DEVICE CHECK
Battery Remaining Longevity: 85 mo
Brady Statistic RV Percent Paced: 1 %
HIGH POWER IMPEDANCE MEASURED VALUE: 75 Ohm
HighPow Impedance: 75 Ohm
Implantable Lead Implant Date: 20161007
Implantable Lead Location: 753860
Implantable Lead Model: 181
Lead Channel Pacing Threshold Amplitude: 0.75 V
Lead Channel Setting Pacing Amplitude: 2.5 V
Lead Channel Setting Pacing Pulse Width: 0.5 ms
MDC IDC LEAD SERIAL: 331179
MDC IDC MSMT BATTERY REMAINING PERCENTAGE: 82 %
MDC IDC MSMT BATTERY VOLTAGE: 3.01 V
MDC IDC MSMT LEADCHNL RV IMPEDANCE VALUE: 410 Ohm
MDC IDC MSMT LEADCHNL RV PACING THRESHOLD PULSEWIDTH: 0.5 ms
MDC IDC MSMT LEADCHNL RV SENSING INTR AMPL: 11.3 mV
MDC IDC PG IMPLANT DT: 20161007
MDC IDC SESS DTM: 20180711060020
MDC IDC SET LEADCHNL RV SENSING SENSITIVITY: 0.5 mV
Pulse Gen Serial Number: 7286706

## 2016-11-24 ENCOUNTER — Telehealth: Payer: Self-pay | Admitting: *Deleted

## 2016-11-24 NOTE — Telephone Encounter (Signed)
Patient called to update home address.  He confirmed address that is listed in system is correct.  Patient appreciative and denies additional questions or concerns at this time.

## 2016-12-18 ENCOUNTER — Other Ambulatory Visit (HOSPITAL_COMMUNITY): Payer: Self-pay | Admitting: Adult Health

## 2016-12-18 ENCOUNTER — Other Ambulatory Visit (HOSPITAL_COMMUNITY): Payer: Self-pay | Admitting: Internal Medicine

## 2016-12-18 DIAGNOSIS — E118 Type 2 diabetes mellitus with unspecified complications: Secondary | ICD-10-CM

## 2016-12-18 DIAGNOSIS — E785 Hyperlipidemia, unspecified: Secondary | ICD-10-CM

## 2016-12-18 MED FILL — SIMVASTATIN 20 MG TABLET: 20 | 30 days supply | Qty: 30 | Fill #0

## 2016-12-18 MED FILL — FUROSEMIDE 40 MG TABLET: 40 | 30 days supply | Qty: 60 | Fill #1

## 2016-12-18 MED FILL — CARVEDILOL 25 MG TABS: 25 | 30 days supply | Qty: 60 | Fill #2

## 2016-12-18 MED FILL — SPIRONOLACTONE 25 MG TABLET: 25 | 30 days supply | Qty: 30 | Fill #0

## 2016-12-18 MED FILL — DIGOXIN 0.125 MG TABLET: 125 | 30 days supply | Qty: 30 | Fill #0

## 2017-01-21 ENCOUNTER — Ambulatory Visit (INDEPENDENT_AMBULATORY_CARE_PROVIDER_SITE_OTHER): Payer: Medicare HMO | Admitting: *Deleted

## 2017-01-21 DIAGNOSIS — I428 Other cardiomyopathies: Secondary | ICD-10-CM

## 2017-01-21 DIAGNOSIS — I5022 Chronic systolic (congestive) heart failure: Secondary | ICD-10-CM

## 2017-01-21 NOTE — Progress Notes (Signed)
Remote ICD transmission.

## 2017-01-23 ENCOUNTER — Encounter: Payer: Self-pay | Admitting: Cardiology

## 2017-02-10 LAB — CUP PACEART REMOTE DEVICE CHECK
Implantable Lead Location: 753860
Implantable Lead Serial Number: 331179
Implantable Pulse Generator Implant Date: 20161007
Lead Channel Setting Pacing Amplitude: 2.5 V
Lead Channel Setting Pacing Pulse Width: 0.5 ms
Lead Channel Setting Sensing Sensitivity: 0.5 mV
MDC IDC LEAD IMPLANT DT: 20161007
MDC IDC SESS DTM: 20181030161141
Pulse Gen Serial Number: 7286706

## 2017-02-25 ENCOUNTER — Telehealth: Payer: Self-pay | Admitting: *Deleted

## 2017-02-25 NOTE — Telephone Encounter (Signed)
LMTCB/sss  Per Gaspar Bidding Small, STJ- episodes of noise from 9/1 and 10/4 appear to be some sort of EMI. Attempted to call patient to see if he's been around any new equipment.

## 2017-04-16 ENCOUNTER — Other Ambulatory Visit (HOSPITAL_COMMUNITY): Payer: Self-pay | Admitting: Adult Health

## 2017-04-16 DIAGNOSIS — I5022 Chronic systolic (congestive) heart failure: Secondary | ICD-10-CM

## 2017-04-16 MED FILL — ENTRESTO 97 MG-103 MG TAB: 97-103 | 30 days supply | Qty: 60 | Fill #0

## 2017-04-16 MED FILL — DIGOXIN 0.125 MG TABLET: 125 | 30 days supply | Qty: 30 | Fill #1

## 2017-04-16 MED FILL — SPIRONOLACTONE 25 MG TABLET: 25 | 30 days supply | Qty: 30 | Fill #1

## 2017-04-16 MED FILL — FUROSEMIDE 40 MG TAB: 40 | 30 days supply | Qty: 60 | Fill #2

## 2017-04-16 MED FILL — CARVEDILOL 25 MG TABLET: 25 | 30 days supply | Qty: 60 | Fill #0

## 2017-04-16 MED FILL — SIMVASTATIN 20 MG TABLET: 20 | 30 days supply | Qty: 30 | Fill #1

## 2017-04-22 ENCOUNTER — Ambulatory Visit (INDEPENDENT_AMBULATORY_CARE_PROVIDER_SITE_OTHER): Payer: Medicare HMO | Admitting: *Deleted

## 2017-04-22 DIAGNOSIS — I5022 Chronic systolic (congestive) heart failure: Secondary | ICD-10-CM

## 2017-04-22 DIAGNOSIS — I428 Other cardiomyopathies: Secondary | ICD-10-CM | POA: Diagnosis not present

## 2017-04-22 NOTE — Progress Notes (Signed)
Remote ICD transmission.   

## 2017-04-23 ENCOUNTER — Encounter: Payer: Self-pay | Admitting: Cardiology

## 2017-04-30 LAB — CUP PACEART REMOTE DEVICE CHECK
Implantable Lead Implant Date: 20161007
Implantable Lead Location: 753860
Implantable Pulse Generator Implant Date: 20161007
Lead Channel Setting Pacing Amplitude: 2.5 V
MDC IDC LEAD SERIAL: 331179
MDC IDC SESS DTM: 20190117194900
MDC IDC SET LEADCHNL RV PACING PULSEWIDTH: 0.5 ms
MDC IDC SET LEADCHNL RV SENSING SENSITIVITY: 0.5 mV
Pulse Gen Serial Number: 7286706

## 2017-07-22 ENCOUNTER — Ambulatory Visit (INDEPENDENT_AMBULATORY_CARE_PROVIDER_SITE_OTHER): Payer: Medicare HMO | Admitting: *Deleted

## 2017-07-22 DIAGNOSIS — I428 Other cardiomyopathies: Secondary | ICD-10-CM

## 2017-07-22 NOTE — Progress Notes (Signed)
Remote ICD transmission.   

## 2017-07-23 ENCOUNTER — Encounter: Payer: Self-pay | Admitting: Cardiology

## 2017-07-28 ENCOUNTER — Other Ambulatory Visit (HOSPITAL_COMMUNITY): Payer: Self-pay | Admitting: Internal Medicine

## 2017-07-28 MED FILL — ENTRESTO 97 MG-103 MG TAB: 97-103 | 30 days supply | Qty: 60 | Fill #1

## 2017-07-28 MED FILL — CARVEDILOL 25 MG TABLET: 25 | 30 days supply | Qty: 60 | Fill #1

## 2017-08-04 ENCOUNTER — Telehealth: Payer: Self-pay | Admitting: Internal Medicine

## 2017-08-04 ENCOUNTER — Other Ambulatory Visit: Payer: Self-pay | Admitting: *Deleted

## 2017-08-04 NOTE — Telephone Encounter (Signed)
Pt need refill on these medications and a call to let him know if they will be processed.  Lasix 63m Digoxin 0.1260mSpironolactone [facility]11[facility]

## 2017-08-05 ENCOUNTER — Other Ambulatory Visit (HOSPITAL_COMMUNITY): Payer: Self-pay | Admitting: *Deleted

## 2017-08-05 DIAGNOSIS — J4541 Moderate persistent asthma with (acute) exacerbation: Secondary | ICD-10-CM

## 2017-08-05 MED ORDER — SPIRONOLACTONE 25 MG PO TABS: 25.0000 mg | ORAL_TABLET | Freq: Every day | ORAL | 1 refills | Status: DC

## 2017-08-05 MED ORDER — DIGOXIN 125 MCG PO TABS: 125.0000 ug | ORAL_TABLET | Freq: Every day | ORAL | 1 refills | Status: DC

## 2017-08-05 MED ORDER — FLUTICASONE FUROATE-VILANTEROL 200-25 MCG/INH IN AEPB
1.0000 | INHALATION_SPRAY | Freq: Every day | RESPIRATORY_TRACT | 11 refills | Status: DC
Start: 2017-08-05 — End: 2021-07-24

## 2017-08-07 ENCOUNTER — Other Ambulatory Visit: Payer: Self-pay

## 2017-08-07 DIAGNOSIS — I5022 Chronic systolic (congestive) heart failure: Secondary | ICD-10-CM

## 2017-08-13 ENCOUNTER — Other Ambulatory Visit (HOSPITAL_COMMUNITY): Payer: Self-pay | Admitting: Internal Medicine

## 2017-08-17 ENCOUNTER — Telehealth: Payer: Self-pay | Admitting: Internal Medicine

## 2017-08-17 DIAGNOSIS — I5022 Chronic systolic (congestive) heart failure: Secondary | ICD-10-CM

## 2017-08-17 MED ORDER — FUROSEMIDE 40 MG PO TABS: 40.0000 mg | ORAL_TABLET | Freq: Two times a day (BID) | ORAL | 0 refills | Status: DC

## 2017-08-17 MED ORDER — DIGOXIN 125 MCG PO TABS: 125.0000 ug | ORAL_TABLET | Freq: Every day | ORAL | 0 refills | Status: DC

## 2017-08-17 MED ORDER — SPIRONOLACTONE 25 MG PO TABS: 25.0000 mg | ORAL_TABLET | Freq: Every day | ORAL | 0 refills | Status: DC

## 2017-08-17 MED ORDER — CARVEDILOL 25 MG PO TABS: ORAL_TABLET | ORAL | 0 refills | Status: DC

## 2017-08-17 MED FILL — SPIRONOLACTONE 25 MG TABLET: 25 | 45 days supply | Qty: 45 | Fill #0

## 2017-08-17 MED FILL — FUROSEMIDE 40 MG TAB: 40 | 45 days supply | Qty: 90 | Fill #0

## 2017-08-17 MED FILL — DIGOXIN 0.125 MG TABLET: 125 | 45 days supply | Qty: 45 | Fill #0

## 2017-08-17 NOTE — Telephone Encounter (Signed)
New message   *STAT* If patient is at the pharmacy, call can be transferred to refill team.   1. Which medications need to be refilled? (please list name of each medication and dose if known) carvedilol (COREG) 25 MG tablet, digoxin (LANOXIN) 0.125 MG tablet and  furosemide (LASIX) 40 MG tablet, and spironolactone (ALDACTONE) 25 MG tablet 2. Which pharmacy/location (including street and city if local pharmacy) is medication to be sent to?[place], [place].  3. Do they need a 30 day or 90 day supply? [place]

## 2017-08-17 NOTE — Telephone Encounter (Signed)
Spoke with pt regarding the need for a f/up appt for his device check, labs, and medication refills. Pt states the earliest appt he could get with Dr Caryl Comes was in October 2019. I contacted scheduling and she stated we could get him in the office in June 2019. I refilled pt's refill request for 45 days. I did not want him to go into heart failure due to not having his needed medications. Pt verbalized understanding of the importance in keeping his scheduled OV for medication mgt and lab draws.

## 2017-08-17 NOTE — Telephone Encounter (Signed)
Pt is calling requesting refills on carvedilol, digoxin, furosemide and spironolactone. Pt has an appt on 01/2018, but pt has not been seen by Dr. Caryl Comes since 01/2016. Would Dr. Caryl Comes like to refill these medications until the appointment time? Please address

## 2017-08-17 NOTE — Telephone Encounter (Signed)
Called pt and left message asking pt to call back to schedule an overdue yearly appt with Dr. Caryl Comes or refill with PCP or call CHF clinic to schedule an overdue appt as well to refill these medications. Pt has not been seen by Dr. Caryl Comes since 10 2017.

## 2017-08-21 LAB — CUP PACEART REMOTE DEVICE CHECK
Date Time Interrogation Session: 20190510122422
Implantable Lead Implant Date: 20161007
Implantable Lead Location: 753860
Implantable Lead Serial Number: 331179
Implantable Pulse Generator Implant Date: 20161007
Lead Channel Setting Pacing Amplitude: 2.5 V
MDC IDC SET LEADCHNL RV PACING PULSEWIDTH: 0.5 ms
MDC IDC SET LEADCHNL RV SENSING SENSITIVITY: 0.5 mV
Pulse Gen Serial Number: 7286706

## 2017-09-18 ENCOUNTER — Ambulatory Visit (INDEPENDENT_AMBULATORY_CARE_PROVIDER_SITE_OTHER): Payer: Medicare HMO | Admitting: Internal Medicine

## 2017-09-18 ENCOUNTER — Encounter: Payer: Self-pay | Admitting: Internal Medicine

## 2017-09-18 VITALS — BP 112/84 | HR 77 | Ht 63.0 in | Wt 223.4 lb

## 2017-09-18 DIAGNOSIS — I428 Other cardiomyopathies: Secondary | ICD-10-CM

## 2017-09-18 DIAGNOSIS — I5022 Chronic systolic (congestive) heart failure: Secondary | ICD-10-CM | POA: Diagnosis not present

## 2017-09-18 DIAGNOSIS — Q210 Ventricular septal defect: Secondary | ICD-10-CM | POA: Diagnosis not present

## 2017-09-18 DIAGNOSIS — Z95810 Presence of automatic (implantable) cardiac defibrillator: Secondary | ICD-10-CM | POA: Diagnosis not present

## 2017-09-18 LAB — CUP PACEART INCLINIC DEVICE CHECK
Battery Remaining Longevity: 79 mo
Date Time Interrogation Session: 20190607134749
HighPow Impedance: 64.125
Implantable Lead Implant Date: 20161007
Implantable Lead Serial Number: 331179
Implantable Pulse Generator Implant Date: 20161007
Lead Channel Impedance Value: 400 Ohm
Lead Channel Pacing Threshold Pulse Width: 0.5 ms
Lead Channel Sensing Intrinsic Amplitude: 10.1 mV
Lead Channel Setting Pacing Amplitude: 2.5 V
MDC IDC LEAD LOCATION: 753860
MDC IDC MSMT LEADCHNL RV PACING THRESHOLD AMPLITUDE: 0.75 V
MDC IDC MSMT LEADCHNL RV PACING THRESHOLD AMPLITUDE: 0.75 V
MDC IDC MSMT LEADCHNL RV PACING THRESHOLD PULSEWIDTH: 0.5 ms
MDC IDC SET LEADCHNL RV PACING PULSEWIDTH: 0.5 ms
MDC IDC SET LEADCHNL RV SENSING SENSITIVITY: 0.5 mV
MDC IDC STAT BRADY RV PERCENT PACED: 0 %
Pulse Gen Serial Number: 7286706

## 2017-09-18 NOTE — Progress Notes (Signed)
Patient Care Team: [name], MD as PCP - General (Internal Medicine)   HPI  Kevin Cardenas is a 42 y.o. male Seen in followup for ICD implanted for primary prevention in setting of nonischemic cardiomyopathy with prior VSD repair    Ejection fraction 2009 was 35% or so ejection fraction most recently has remained in the 20-25% range. DATE TEST EF   2009 Echo   35 %   7/16 Echo   20.25 %   10/17 Echo 30-35%       Date Cr K Dig  12/16 1.1 4.9    10/17 0.94 4.1 <0.5        He has been doing pretty well.  He is been caring for his mother who died 09-11-22.  He is holding up well.  No chest pain shortness of breath or edema.  No ICD discharges.  Tolerating medications.      Past Medical History:  Diagnosis Date  . CHF (congestive heart failure) (HCC)    Systolic, EF 27% per echo 11/12  . Diabetes mellitus   . Hypertension   . Non-ischemic cardiomyopathy ([place])   . Obstructive sleep apnea   . VSD (ventricular septal defect)     Past Surgical History:  Procedure Laterality Date  . EP IMPLANTABLE DEVICE N/A 01/19/2015   Procedure: ICD Implant;  Surgeon: Deboraha Sprang, MD;  Location: [facility];  Service: Cardiovascular;  Laterality: N/A;  . RIGHT HEART CATHETERIZATION N/A 08/03/2014   Procedure: RIGHT HEART CATH;  Surgeon: Larey Dresser, MD;  Location: [facility] CATH LAB;  Service: Cardiovascular;  Laterality: N/A;  . VSD REPAIR      Current Outpatient Medications  Medication Sig Dispense Refill  . albuterol (PROVENTIL) (2.5 MG/3ML) 0.083% nebulizer solution Take 3 mLs (2.5 mg total) by nebulization every 6 (six) hours as needed for wheezing or shortness of breath. 75 mL 11  . aspirin 81 MG chewable tablet Chew 1 tablet (81 mg total) by mouth daily. 30 tablet 0  . carvedilol (COREG) 25 MG tablet TAKE 1 TABLET BY MOUTH 2 TIMES DAILY WITH A MEAL. 90 tablet 0  . digoxin (LANOXIN) 0.125 MG tablet Take 1 tablet (125 mcg total) by mouth daily. NEEDS OFFICE VISIT  45 tablet 0  . ENTRESTO 97-103 MG TAKE 1 TABLET BY MOUTH 2 TIMES DAILY. 60 tablet 3  . fluticasone furoate-vilanterol (BREO ELLIPTA) 200-25 MCG/INH AEPB Inhale 1 puff into the lungs daily. 30 each 11  . furosemide (LASIX) 40 MG tablet Take 1 tablet (40 mg total) by mouth 2 (two) times daily. 90 tablet 0  . simvastatin (ZOCOR) 20 MG tablet TAKE 1 TABLET BY MOUTH ONCE DAILY AT BEDTIME 30 tablet 5  . spironolactone (ALDACTONE) 25 MG tablet Take 1 tablet (25 mg total) by mouth daily. NEEDS OFFICE VISIT 45 tablet 0   No current facility-administered medications for this visit.     No Known Allergies    Review of Systems negative except from HPI and PMH  Physical Exam BP 112/84   Pulse 77   Ht _0  (1.6 m)   Wt 223 lb 6.4 oz (101.3 kg)   SpO2 95%   BMI 39.57 kg/m  Well developed and nourished in no acute distress HENT normal Neck supple with JVP-flat Clear Regular rate and rhythm, no murmurs or gallops Abd-soft with active BS No Clubbing cyanosis edema Skin-warm and dry A & Oriented  Grossly normal sensory and motor function  ECG sinus at 77 Interval 16/13/41 Nonspecific IVCD  Assessment and  Plan  Nonischemic cardiomyopathy  Congestive heart failure-class II!  History of VSD repair  ICD St Jude   IVCD  Guideline directed medical therapy--we will continue we will check renal function potassium and digoxin level  No intercurrent Ventricular tachycardia  Euvolemic continue current meds  We spent more than 50% of our >25 min visit in face to face counseling regarding the above

## 2017-09-18 NOTE — Patient Instructions (Signed)
Medication Instructions:  Your physician recommends that you continue on your current medications as directed. Please refer to the Current Medication list given to you today.  Labwork: You will have labs drawn today: BMP, Dig level  Testing/Procedures: None ordered.   Follow-Up: Your physician wants you to follow-up in: 6 months with Tommye Standard, PA. You will receive a reminder letter in the mail two months in advance. If you don't receive a letter, please call our office to schedule the follow-up appointment.  Remote monitoring is used to monitor your ICD from home. This monitoring reduces the number of office visits required to check your device to one time per year. It allows [place] to keep an eye on the functioning of your device to ensure it is working properly. You are scheduled for a device check from home on 10/21/2017. You may send your transmission at any time that day. If you have a wireless device, the transmission will be sent automatically. After your physician reviews your transmission, you will receive a postcard with your next transmission date.     Any Other Special Instructions Will Be Listed Below (If Applicable).     If you need a refill on your cardiac medications before your next appointment, please call your pharmacy.

## 2017-09-19 LAB — BASIC METABOLIC PANEL
BUN / CREAT RATIO: 16 (ref 9–20)
BUN: 15 mg/dL (ref 6–24)
CO2: 20 mmol/L (ref 20–29)
CREATININE: 0.95 mg/dL (ref 0.76–1.27)
Calcium: 8.8 mg/dL (ref 8.7–10.2)
Chloride: 102 mmol/L (ref 96–106)
GFR, EST AFRICAN AMERICAN: 114 mL/min/{1.73_m2} (ref >59)
GFR, EST NON AFRICAN AMERICAN: 98 mL/min/{1.73_m2} (ref >59)
Glucose: 91 mg/dL (ref 65–99)
Potassium: 4.3 mmol/L (ref 3.5–5.2)
SODIUM: 140 mmol/L (ref 134–144)

## 2017-09-19 LAB — DIGOXIN LEVEL: Digoxin, Serum: 0.5 ng/mL (ref 0.5–0.9)

## 2017-10-21 ENCOUNTER — Ambulatory Visit (INDEPENDENT_AMBULATORY_CARE_PROVIDER_SITE_OTHER): Payer: Medicare HMO | Admitting: *Deleted

## 2017-10-21 DIAGNOSIS — I428 Other cardiomyopathies: Secondary | ICD-10-CM | POA: Diagnosis not present

## 2017-10-21 NOTE — Progress Notes (Signed)
Remote ICD transmission.

## 2017-11-25 LAB — CUP PACEART REMOTE DEVICE CHECK
Implantable Lead Location: 753860
Implantable Lead Serial Number: 331179
Implantable Pulse Generator Implant Date: 20161007
MDC IDC LEAD IMPLANT DT: 20161007
MDC IDC SESS DTM: 20190814100500
Pulse Gen Serial Number: 7286706

## 2017-11-26 ENCOUNTER — Encounter (HOSPITAL_COMMUNITY): Payer: Self-pay

## 2017-11-26 NOTE — Progress Notes (Signed)
Receive medical record request from [employer], case # [id number].  Requested records faxed today to [phone].  Original request will be scanned to patients electronic medial record.

## 2018-01-20 ENCOUNTER — Ambulatory Visit (INDEPENDENT_AMBULATORY_CARE_PROVIDER_SITE_OTHER): Payer: Medicare HMO | Admitting: *Deleted

## 2018-01-20 DIAGNOSIS — I5022 Chronic systolic (congestive) heart failure: Secondary | ICD-10-CM

## 2018-01-20 DIAGNOSIS — I428 Other cardiomyopathies: Secondary | ICD-10-CM | POA: Diagnosis not present

## 2018-01-21 NOTE — Progress Notes (Signed)
Remote ICD transmission.   

## 2018-01-26 ENCOUNTER — Encounter: Payer: Medicare HMO | Admitting: Internal Medicine

## 2018-02-25 ENCOUNTER — Other Ambulatory Visit: Payer: Self-pay | Admitting: Internal Medicine

## 2018-02-25 ENCOUNTER — Other Ambulatory Visit (HOSPITAL_COMMUNITY): Payer: Self-pay | Admitting: Internal Medicine

## 2018-02-25 DIAGNOSIS — E118 Type 2 diabetes mellitus with unspecified complications: Secondary | ICD-10-CM

## 2018-02-25 DIAGNOSIS — E785 Hyperlipidemia, unspecified: Secondary | ICD-10-CM

## 2018-02-25 MED FILL — ENTRESTO 97 MG-103 MG TAB: 97-103 | 30 days supply | Qty: 60 | Fill #2

## 2018-02-25 MED FILL — CARVEDILOL 25 MG TABLET: 25 | 45 days supply | Qty: 90 | Fill #0

## 2018-02-26 MED FILL — SPIRONOLACTONE 25 MG TABLET: 25 | 90 days supply | Qty: 90 | Fill #0

## 2018-02-26 MED FILL — DIGOXIN 0.125 MG TABLET: 125 | 90 days supply | Qty: 90 | Fill #0

## 2018-02-26 MED FILL — FUROSEMIDE 40 MG TAB: 40 | 90 days supply | Qty: 180 | Fill #0

## 2018-03-15 ENCOUNTER — Other Ambulatory Visit: Payer: Self-pay

## 2018-03-15 ENCOUNTER — Encounter (HOSPITAL_COMMUNITY): Payer: Self-pay

## 2018-03-15 ENCOUNTER — Ambulatory Visit (HOSPITAL_COMMUNITY)
Admission: RE | Admit: 2018-03-15 | Discharge: 2018-03-15 | Disposition: A | Discharge status: Home / Self Care | Payer: Medicare HMO | Source: Ambulatory Visit | Attending: Internal Medicine | Admitting: Internal Medicine

## 2018-03-15 VITALS — BP 138/94 | HR 88 | Wt 227.4 lb

## 2018-03-15 DIAGNOSIS — I10 Essential (primary) hypertension: Secondary | ICD-10-CM | POA: Diagnosis not present

## 2018-03-15 DIAGNOSIS — Q210 Ventricular septal defect: Secondary | ICD-10-CM | POA: Diagnosis not present

## 2018-03-15 DIAGNOSIS — I5022 Chronic systolic (congestive) heart failure: Secondary | ICD-10-CM

## 2018-03-15 DIAGNOSIS — Z9989 Dependence on other enabling machines and devices: Secondary | ICD-10-CM | POA: Diagnosis not present

## 2018-03-15 DIAGNOSIS — G4733 Obstructive sleep apnea (adult) (pediatric): Secondary | ICD-10-CM | POA: Insufficient documentation

## 2018-03-15 DIAGNOSIS — Z95810 Presence of automatic (implantable) cardiac defibrillator: Secondary | ICD-10-CM | POA: Diagnosis not present

## 2018-03-15 DIAGNOSIS — I428 Other cardiomyopathies: Secondary | ICD-10-CM | POA: Insufficient documentation

## 2018-03-15 DIAGNOSIS — E785 Hyperlipidemia, unspecified: Secondary | ICD-10-CM

## 2018-03-15 DIAGNOSIS — I5042 Chronic combined systolic (congestive) and diastolic (congestive) heart failure: Secondary | ICD-10-CM | POA: Insufficient documentation

## 2018-03-15 DIAGNOSIS — Z79899 Other long term (current) drug therapy: Secondary | ICD-10-CM | POA: Diagnosis not present

## 2018-03-15 DIAGNOSIS — I110 Hypertensive heart disease with heart failure: Secondary | ICD-10-CM | POA: Diagnosis not present

## 2018-03-15 DIAGNOSIS — E118 Type 2 diabetes mellitus with unspecified complications: Secondary | ICD-10-CM

## 2018-03-15 DIAGNOSIS — E119 Type 2 diabetes mellitus without complications: Secondary | ICD-10-CM | POA: Insufficient documentation

## 2018-03-15 DIAGNOSIS — Z7982 Long term (current) use of aspirin: Secondary | ICD-10-CM | POA: Insufficient documentation

## 2018-03-15 LAB — BASIC METABOLIC PANEL
ANION GAP: 8 (ref 5–15)
BUN: 18 mg/dL (ref 6–20)
CHLORIDE: 100 mmol/L (ref 98–111)
CO2: 28 mmol/L (ref 22–32)
CREATININE: 1 mg/dL (ref 0.61–1.24)
Calcium: 8.7 mg/dL — ABNORMAL LOW (ref 8.9–10.3)
GFR calc non Af Amer: >60 mL/min (ref >60)
Glucose, Bld: 93 mg/dL (ref 70–99)
POTASSIUM: 3.7 mmol/L (ref 3.5–5.1)
SODIUM: 136 mmol/L (ref 135–145)

## 2018-03-15 LAB — DIGOXIN LEVEL

## 2018-03-15 MED ORDER — SIMVASTATIN 20 MG PO TABS: 20.0000 mg | ORAL_TABLET | Freq: Every day | ORAL | 5 refills | Status: DC

## 2018-03-15 MED FILL — SIMVASTATIN 20 MG TABLET: 20 | 30 days supply | Qty: 30 | Fill #0

## 2018-03-15 NOTE — Patient Instructions (Signed)
Labs today We will only contact you if something comes back abnormal or we need to make some changes. Otherwise no news is good news!  Your physician has requested that you have an echocardiogram. Echocardiography is a painless test that uses sound waves to create images of your heart. It provides your doctor with information about the size and shape of your heart and how well your heart's chambers and valves are working. This procedure takes approximately one hour. There are no restrictions for this procedure.  Your physician recommends that you schedule a follow-up appointment in: 3 months with Dr Aundra Dubin and echo  Do the following things EVERYDAY: 1) Weigh yourself in the morning before breakfast. Write it down and keep it in a log. 2) Take your medicines as prescribed 3) Eat low salt foods-Limit salt (sodium) to 2000 mg per day.  4) Stay as active as you can everyday 5) Limit all fluids for the day to less than 2 liters   At the [facility], you and your health needs are our priority. As part of our continuing mission to provide you with exceptional heart care, we have created designated Provider Care Teams. These Care Teams include your primary Cardiologist (physician) and Advanced Practice Providers (APPs- Physician Assistants and Nurse Practitioners) who all work together to provide you with the care you need, when you need it.   You may see any of the following providers on your designated Care Team at your next follow up: Marland Kitchen Dr Glori Bickers . Dr [name] . Darrick Grinder, NP . Lillia Mountain, NP . Rebecca Eaton

## 2018-03-15 NOTE — Progress Notes (Signed)
Patient ID: Kevin Cardenas, male   DOB: 08-24-1975, 42 y.o.   MRN: [id number] PCP: Dr. Ronnald Ramp Cardiology: [name] EP: Dr Caryl Comes  Kevin Cardenas is a 42 y.o. gentlemen with history of VSD repair and removal of a pulmonary artery band at age 42, DM, HTN, systolic heart failure, NICM and OSA diagnosed in past but not on CPAP. Has St Jude ICD (placed 01/2015) .   Cardiac cath in April 2009 showed His LVEF was 35-40%. Coronary arteries were normal. RA mean 22 mmHg, RV 62/25, PA 59/39 mean 47 mmHg, mean pulmonary capillary wedge pressure 37 mmHg. Cardiac output was 6.21 with a cardiac index of 2.85 by FICK, and 4.95 and 2.27 respectively by thermodilution. There was no shunting at the atrial or ventricular level.   He returns for HF follow up. He has not been seen in 2 years. Says he hasn't  been seen because his Mother was sick. His Mother passed away earlier this year. Overall feeling fine. SOB with steps. Says he takes his time when he is walking. Denies PND/Orthopnea.He uses CPAP 2-3 times a week. Appetite ok. No fever or chills. He has not been weighing at home because he doesn't have a scale. He ran out of his medications for about 2 weeks.  Taking all medications.   Echo 9/11: LVEF 20-25%  Echo November 1st, 2012: LVEF 30-35%, diffuse hypokinesis and mild LVH. RV systolic mildly reduced. LA mildly dilated. Pulmonic valve with mild stenosis.  Echo 05/12/2014: EF 15-20% Grade IDD, LV moderately dilated.  RV normal. Mild pulmonc stenosis mean peak gradient 14.  Echo 7/16: EF 25-30%, mild LV dilation, mild LVH, trivial pulmonic stenosis with peak gradient 11 mmHg, mildly decreased RV function with mild RV dilation.   Echo 02/2106; Ef 30-35% Grade IIDD   CPX (3/16): RER 1.03, peak VO2 19.9, VE/VCO2 slope 18, mild to moderately impaired functional capacity.  There was moderate to severe mixed restrictive/obstructive pattern on spirometry => exertional hypoxemia, oxygen saturation decreased to 84%.   RHC  08/03/2014  RA mean 9 RV 42/9 PA 35/12, mean 22 PCWP mean 8 Peak to peak gradient across pulmonic valve 7 mmHg, mean gradient 6 mmHg.  Oxygen saturations: RA 56% RV 60% PA 61% AO 93% Cardiac Output (Fick) 3.44  Cardiac Index (Fick) 1.77 Cardiac Output (Thermo) 4.73 Cardiac Index (Thermo) 2.44  Labs (1/16): K 4.1, creatinine 0.9, TSH normal, LDL 89, HDL 46 Labs (05/24/2014): K 4.1 Creatinine 0.97  Labs (3/16): BNP 54, K 4.4, creatinine 1.0 Labs 08/14/2014: K 4.0 Creatinine 0.93  Labs (6/16): digoxin < 0.2 Labs (9/16): K 3.7, creatinine 0.93, digoxin < 0.5, HCT 40.4 Labs (01/2016): dig 0.5 K 4.1 Creatinine 0.94  Labs (09/2017): K 4.3 Creatinine 0.95  ROS: All systems negative except as listed in HPI, PMH and Problem List.  Past Medical History:  Diagnosis Date  . CHF (congestive heart failure) (HCC)    Systolic, EF 95% per echo 11/12  . Diabetes mellitus   . Hypertension   . Non-ischemic cardiomyopathy ([place])   . Obstructive sleep apnea   . VSD (ventricular septal defect)     Current Outpatient Medications  Medication Sig Dispense Refill  . aspirin 81 MG chewable tablet Chew 1 tablet (81 mg total) by mouth daily. 30 tablet 0  . carvedilol (COREG) 25 MG tablet TAKE 1 TABLET BY MOUTH 2 TIMES DAILY WITH A MEAL. 90 tablet 0  . digoxin (LANOXIN) 0.125 MG tablet TAKE 1 TABLET (125 MCG TOTAL) BY MOUTH  DAILY. 90 tablet 2  . ENTRESTO 97-103 MG TAKE 1 TABLET BY MOUTH 2 TIMES DAILY. 60 tablet 3  . furosemide (LASIX) 40 MG tablet TAKE 1 TABLET (40 MG TOTAL) BY MOUTH 2 (TWO) TIMES DAILY. 180 tablet 2  . simvastatin (ZOCOR) 20 MG tablet TAKE 1 TABLET BY MOUTH ONCE DAILY AT BEDTIME 30 tablet 5  . spironolactone (ALDACTONE) 25 MG tablet TAKE 1 TABLET (25 MG TOTAL) BY MOUTH DAILY. 90 tablet 2  . albuterol (PROVENTIL) (2.5 MG/3ML) 0.083% nebulizer solution Take 3 mLs (2.5 mg total) by nebulization every 6 (six) hours as needed for wheezing or shortness of breath. 75 mL 11  . fluticasone  furoate-vilanterol (BREO ELLIPTA) 200-25 MCG/INH AEPB Inhale 1 puff into the lungs daily. 30 each 11   No current facility-administered medications for this encounter.     PHYSICAL EXAM: Vitals:   03/15/18 1404  BP: (!) 138/94  Pulse: 88  SpO2: 96%  Weight: 103.1 kg (227 lb 6.4 oz)   Wt Readings from Last 3 Encounters:  03/15/18 103.1 kg (227 lb 6.4 oz)  09/18/17 101.3 kg (223 lb 6.4 oz)  03/17/16 106.4 kg (234 lb 9.6 oz)   General:  Well appearing. No resp difficulty HEENT: normal Neck: supple. no JVD. Carotids 2+ bilat; no bruits. No lymphadenopathy or thryomegaly appreciated. Cor: PMI nondisplaced. Regular rate & rhythm. No rubs, gallops or murmurs. Lungs: clear Abdomen: soft, nontender, nondistended. No hepatosplenomegaly. No bruits or masses. Good bowel sounds. Extremities: no cyanosis, clubbing, rash, edema Neuro: alert & orientedx3, cranial nerves grossly intact. moves all 4 extremities w/o difficulty. Affect pleasant  ASSESSMENT & PLAN: 1. Chronic systolic/diastolic  CHF: Nonischemic cardiomyopathy.  CPX in 3/16 showed a mild to moderate functional limitation, but PFTs were quite abnormal with a mixed restrictive/obstructive picture. RHC in 4/16 showed preserved cardiac index by thermodilution (low by Fick).   Echo in 7/16 showed EF 25-30% with mildly dilated and mildly dysfunctional RV.  Most recent ECHO 02/2016 Ef 30-35%.  He has a [employer], political party ICD (IVCD on ECG, not thought to be likely to benefit from CRT).  NYHA II. Volume status stable. Continue lasix 40 mg twice a day.  -On goal dose carvedilol and entresto.  - Add 0.125 mg digoxin. Check dig level.  -  Check BMET.  2. H/o VSD: No residual shunt noted on 7/16 echo.  Last RHC also did not suggest any significant left to right shunting.  3. Pulmonary stenosis: Very mild by 7/16 echo.  4. OSA: stressed the importance of nightly CPAP. Encouraged to use CPAP nightly.     5. Abnormal PFTs: Pulmonary following.   6.  HGD:JMEQASTM current regimen.   Follow up 3 months with an ECHO and Dr Aundra Dubin.  Kevin Savage,NP-C  03/15/2018

## 2018-03-15 NOTE — Progress Notes (Signed)
Cardiology Office Note Date:  03/17/2018  Patient ID:  Kevin Cardenas, Kevin Cardenas 1975-11-20, MRN [id number] PCP:  [name], MD  Cardiologist:  [name] Electrophysiologist: Dr. Caryl Comes   Chief Complaint: routine visit  History of Present Illness: Kevin Cardenas is a 42 y.o. male with history of VSD repair and removal of a pulmonary artery band at age 44, DM, HTN, chronic CHF (systolic), NICM, OSA (not on CPAP), ICD.  He comes in today to be seen for Dr. Caryl Comes, last seen by him in Jun 2019.  At that time, doing well, no changes were made, BMET was updated.  He feels like he is doing well, just saw AHF team 2 days ago, dig was added.  He reports prrtty good exertional capacity, 2-3 blocks.  Able to do his ADLs with minimal DOE ir any.  He denies CP, states though when he forgts his medicines he will feel a little full in his chest.  NO symptoms of PND or orthopnea, no near syncope or syncope, no palpitations.  Device information: SJM single chamber ICD implanted 01/19/15   Past Medical History:  Diagnosis Date  . CHF (congestive heart failure) (HCC)    Systolic, EF 67% per echo 11/12  . Diabetes mellitus   . Hypertension   . Non-ischemic cardiomyopathy ([place])   . Obstructive sleep apnea   . VSD (ventricular septal defect)     Past Surgical History:  Procedure Laterality Date  . EP IMPLANTABLE DEVICE N/A 01/19/2015   Procedure: ICD Implant;  Surgeon: Deboraha Sprang, MD;  Location: [facility];  Service: Cardiovascular;  Laterality: N/A;  . RIGHT HEART CATHETERIZATION N/A 08/03/2014   Procedure: RIGHT HEART CATH;  Surgeon: Larey Dresser, MD;  Location: [facility] CATH LAB;  Service: Cardiovascular;  Laterality: N/A;  . VSD REPAIR      Current Outpatient Medications  Medication Sig Dispense Refill  . albuterol (PROVENTIL) (2.5 MG/3ML) 0.083% nebulizer solution Take 3 mLs (2.5 mg total) by nebulization every 6 (six) hours as needed for wheezing or shortness of breath. 75 mL 11    . aspirin 81 MG chewable tablet Chew 1 tablet (81 mg total) by mouth daily. 30 tablet 0  . carvedilol (COREG) 25 MG tablet TAKE 1 TABLET BY MOUTH 2 TIMES DAILY WITH A MEAL. 90 tablet 0  . digoxin (LANOXIN) 0.125 MG tablet TAKE 1 TABLET (125 MCG TOTAL) BY MOUTH DAILY. 90 tablet 2  . ENTRESTO 97-103 MG TAKE 1 TABLET BY MOUTH 2 TIMES DAILY. 60 tablet 3  . fluticasone furoate-vilanterol (BREO ELLIPTA) 200-25 MCG/INH AEPB Inhale 1 puff into the lungs daily. 30 each 11  . furosemide (LASIX) 40 MG tablet TAKE 1 TABLET (40 MG TOTAL) BY MOUTH 2 (TWO) TIMES DAILY. 180 tablet 2  . simvastatin (ZOCOR) 20 MG tablet Take 1 tablet (20 mg total) by mouth at bedtime. 30 tablet 5  . spironolactone (ALDACTONE) 25 MG tablet TAKE 1 TABLET (25 MG TOTAL) BY MOUTH DAILY. 90 tablet 2   No current facility-administered medications for this visit.     Allergies:   Patient has no known allergies.   Social History:  The patient  reports that he quit smoking about 6 years ago. His smoking use included cigarettes. He has a 4.50 pack-year smoking history. He has never used smokeless tobacco. He reports that he does not drink alcohol or use drugs.   Family History:  The patient's family history includes Diabetes in his maternal grandfather and  paternal grandmother; Lung cancer in his father; Stroke in his father and mother.  ROS:  Please see the history of present illness.  All other systems are reviewed and otherwise negative.   PHYSICAL EXAM:  VS:  BP (!) 146/68   Pulse 91   Ht _0  (1.6 m)   Wt 230 lb (104.3 kg)   BMI 40.74 kg/m  BMI: Body mass index is 40.74 kg/m. Well nourished, well developed, in no acute distress  HEENT: normocephalic, atraumatic  Neck: no JVD, carotid bruits or masses Cardiac:  RRR; no significant murmurs, no rubs, or gallops Lungs:  CTA b/l, no wheezing, rhonchi or rales  Abd: soft, nontender MS: no deformity or atrophy Ext: no edema  Skin: warm and dry, no rash Neuro:  No gross  deficits appreciated Psych: euthymic mood, full affect  ICD site is stable, no tethering or discomfort   EKG:  Not done today ICD interrogation done today and reviewed by myself: battery and lead measurements are good.  No episodes, <1% VP  03/03/16: TTE Study Conclusions - Left ventricle: The cavity size was mildly dilated. There was   mild concentric hypertrophy. Systolic function was moderately to   severely reduced. The estimated ejection fraction was in the   range of 30% to 35%. Diffuse hypokinesis. Although no diagnostic   regional wall motion abnormality was identified, this possibility   cannot be completely excluded on the basis of this study.   Features are consistent with a pseudonormal left ventricular   filling pattern, with concomitant abnormal relaxation and   increased filling pressure (grade 2 diastolic dysfunction). - Left atrium: The atrium was mildly dilated. - Right ventricle: The cavity size was mildly dilated. Systolic   function was reduced. - Pulmonary arteries: Systolic pressure was mildly increased. PA   peak pressure: 41 mm Hg (S).   Echo 9/11: LVEF 20-25%  Echo November 1st, 2012: LVEF 30-35%, diffuse hypokinesis and mild LVH. RV systolic mildly reduced. LA mildly dilated. Pulmonic valve with mild stenosis.  Echo 05/12/2014: EF 15-20% Grade IDD, LV moderately dilated.  RV normal. Mild pulmonc stenosis mean peak gradient 14.  Echo 7/16: EF 25-30%, mild LV dilation, mild LVH, trivial pulmonic stenosis with peak gradient 11 mmHg, mildly decreased RV function with mild RV dilation.   Cardiac cath in April 2009 showed His LVEF was 35-40%. Coronary arteries were normal  CPX (3/16): RER 1.03, peak VO2 19.9, VE/VCO2 slope 18, mild to moderately impaired functional capacity.  There was moderate to severe mixed restrictive/obstructive pattern on spirometry => exertional hypoxemia, oxygen saturation decreased to 84%.   Recent Labs: 03/15/2018: BUN 18; Creatinine,  Ser 1.00; Potassium 3.7; Sodium 136  No results found for requested labs within last 8760 hours.   Estimated Creatinine Clearance: 102.3 mL/min (by C-G formula based on SCr of 1 mg/dL).   Wt Readings from Last 3 Encounters:  03/17/18 230 lb (104.3 kg)  03/15/18 227 lb 6.4 oz (103.1 kg)  09/18/17 223 lb 6.4 oz (101.3 kg)     Other studies reviewed: Additional studies/records reviewed today include: summarized above  ASSESSMENT AND PLAN:  1. ICD     Intact function no changes made  2. NICM 3. Chronic CHF, class II     No symptoms or exam findings of fluid OL, CorView is at threshold, trending downwards     He was given a scale the other day at his AHF visit, we discussed importance of his daily weights and how/when to do  them  4. HTN     No changes made today   Disposition: F/u with Q 52moremotes, he c/w Dr. MAundra Dubinand his team, we will see him back in-clinic in 1 year, sooner if needed.  Current medicines are reviewed at length with the patient today.  The patient did not have any concerns regarding medicines.  SVenetia Night PA-C 03/17/2018 3:07 PM     [place] [phone](office)  [phone])

## 2018-03-17 ENCOUNTER — Ambulatory Visit (INDEPENDENT_AMBULATORY_CARE_PROVIDER_SITE_OTHER): Payer: Medicare HMO | Admitting: Physician Assistant

## 2018-03-17 VITALS — BP 146/68 | HR 91 | Ht 63.0 in | Wt 230.0 lb

## 2018-03-17 DIAGNOSIS — Z6841 Body Mass Index (BMI) 40.0 and over, adult: Secondary | ICD-10-CM | POA: Diagnosis not present

## 2018-03-17 DIAGNOSIS — I428 Other cardiomyopathies: Secondary | ICD-10-CM | POA: Diagnosis not present

## 2018-03-17 DIAGNOSIS — I5022 Chronic systolic (congestive) heart failure: Secondary | ICD-10-CM

## 2018-03-17 DIAGNOSIS — Z95810 Presence of automatic (implantable) cardiac defibrillator: Secondary | ICD-10-CM | POA: Diagnosis not present

## 2018-03-17 DIAGNOSIS — I110 Hypertensive heart disease with heart failure: Secondary | ICD-10-CM | POA: Diagnosis not present

## 2018-03-17 NOTE — Patient Instructions (Addendum)
Medication Instructions:   NONE ORDERED  TODAY  If you need a refill on your cardiac medications before your next appointment, please call your pharmacy.   Lab work:  NONE ORDERED  TODAY  If you have labs (blood work) drawn today and your tests are completely normal, you will receive your results only by: Marland Kitchen MyChart Message (if you have MyChart) OR . A paper copy in the mail If you have any lab test that is abnormal or we need to change your treatment, we will call you to review the results.  Testing/Procedures:   NONE ORDERED  TODAY  Follow-Up:  At [facility], you and your health needs are our priority.  As part of our continuing mission to provide you with exceptional heart care, we have created designated Provider Care Teams.  These Care Teams include your primary Cardiologist (physician) and Advanced Practice Providers (APPs -  Physician Assistants and Nurse Practitioners) who all work together to provide you with the care you need, when you need it. You will need a follow up appointment in 1 years. Dr. Caryl Comes Please call our office 2 months in advance to schedule this appointment.  You may see None or one of the following Advanced Practice Providers on your designated Care Team:   Chanetta Marshall, NP . Tommye Standard, PA-C  Remote monitoring is used to monitor your Pacemaker of ICD from home. This monitoring reduces the number of office visits required to check your device to one time per year. It allows [place] to keep an eye on the functioning of your device to ensure it is working properly. You are scheduled for a device check from home on . 1-8-2020You may send your transmission at any time that day. If you have a wireless device, the transmission will be sent automatically. After your physician reviews your transmission, you will receive a postcard with your next transmission date.    Any Other Special Instructions Will Be Listed Below (If Applicable).

## 2018-04-02 LAB — CUP PACEART REMOTE DEVICE CHECK
Date Time Interrogation Session: 20191220211554
Implantable Lead Location: 753860
Implantable Lead Model: 181
Implantable Lead Serial Number: 331179
Implantable Pulse Generator Implant Date: 20161007
MDC IDC LEAD IMPLANT DT: 20161007
Pulse Gen Serial Number: 7286706

## 2018-04-21 ENCOUNTER — Ambulatory Visit (INDEPENDENT_AMBULATORY_CARE_PROVIDER_SITE_OTHER): Payer: Medicare HMO

## 2018-04-21 DIAGNOSIS — I428 Other cardiomyopathies: Secondary | ICD-10-CM | POA: Diagnosis not present

## 2018-04-22 LAB — CUP PACEART REMOTE DEVICE CHECK
Date Time Interrogation Session: 20200109081328
Implantable Lead Implant Date: 20161007
Implantable Lead Location: 753860
Implantable Lead Model: 181
Implantable Lead Serial Number: 331179
Implantable Pulse Generator Implant Date: 20161007
Pulse Gen Serial Number: 7286706

## 2018-04-22 NOTE — Progress Notes (Signed)
Remote ICD transmission.

## 2018-06-15 ENCOUNTER — Ambulatory Visit (HOSPITAL_BASED_OUTPATIENT_CLINIC_OR_DEPARTMENT_OTHER)
Admission: RE | Admit: 2018-06-15 | Discharge: 2018-06-15 | Disposition: A | Discharge status: Home / Self Care | Payer: Medicare HMO | Source: Ambulatory Visit | Attending: Cardiology | Admitting: Cardiology

## 2018-06-15 ENCOUNTER — Encounter (HOSPITAL_COMMUNITY): Payer: Self-pay | Admitting: Cardiology

## 2018-06-15 ENCOUNTER — Ambulatory Visit (HOSPITAL_COMMUNITY)
Admission: RE | Admit: 2018-06-15 | Discharge: 2018-06-15 | Disposition: A | Discharge status: Home / Self Care | Payer: Medicare HMO | Source: Ambulatory Visit | Attending: Cardiology | Admitting: Cardiology

## 2018-06-15 VITALS — BP 122/74 | HR 79 | Wt 226.8 lb

## 2018-06-15 DIAGNOSIS — E785 Hyperlipidemia, unspecified: Secondary | ICD-10-CM | POA: Insufficient documentation

## 2018-06-15 DIAGNOSIS — I110 Hypertensive heart disease with heart failure: Secondary | ICD-10-CM | POA: Diagnosis not present

## 2018-06-15 DIAGNOSIS — I5022 Chronic systolic (congestive) heart failure: Secondary | ICD-10-CM | POA: Insufficient documentation

## 2018-06-15 DIAGNOSIS — G4733 Obstructive sleep apnea (adult) (pediatric): Secondary | ICD-10-CM | POA: Insufficient documentation

## 2018-06-15 DIAGNOSIS — Q210 Ventricular septal defect: Secondary | ICD-10-CM | POA: Diagnosis not present

## 2018-06-15 DIAGNOSIS — I428 Other cardiomyopathies: Secondary | ICD-10-CM | POA: Diagnosis not present

## 2018-06-15 DIAGNOSIS — E669 Obesity, unspecified: Secondary | ICD-10-CM | POA: Insufficient documentation

## 2018-06-15 DIAGNOSIS — Z7982 Long term (current) use of aspirin: Secondary | ICD-10-CM | POA: Diagnosis not present

## 2018-06-15 DIAGNOSIS — E119 Type 2 diabetes mellitus without complications: Secondary | ICD-10-CM | POA: Diagnosis not present

## 2018-06-15 DIAGNOSIS — Z79899 Other long term (current) drug therapy: Secondary | ICD-10-CM | POA: Diagnosis not present

## 2018-06-15 DIAGNOSIS — Z95810 Presence of automatic (implantable) cardiac defibrillator: Secondary | ICD-10-CM | POA: Insufficient documentation

## 2018-06-15 LAB — BASIC METABOLIC PANEL
ANION GAP: 11 (ref 5–15)
BUN: 12 mg/dL (ref 6–20)
CO2: 22 mmol/L (ref 22–32)
Calcium: 9.1 mg/dL (ref 8.9–10.3)
Chloride: 105 mmol/L (ref 98–111)
Creatinine, Ser: 1.02 mg/dL (ref 0.61–1.24)
GFR calc Af Amer: >60 mL/min (ref >60)
GFR calc non Af Amer: >60 mL/min (ref >60)
GLUCOSE: 114 mg/dL — AB (ref 70–99)
Potassium: 4.3 mmol/L (ref 3.5–5.1)
Sodium: 138 mmol/L (ref 135–145)

## 2018-06-15 LAB — LIPID PANEL
Cholesterol: 137 mg/dL (ref 0–200)
HDL: 39 mg/dL — ABNORMAL LOW (ref >40)
LDL Cholesterol: 79 mg/dL (ref 0–99)
TRIGLYCERIDES: 97 mg/dL (ref <150)
Total CHOL/HDL Ratio: 3.5 RATIO
VLDL: 19 mg/dL (ref 0–40)

## 2018-06-15 LAB — DIGOXIN LEVEL: Digoxin Level: <0.2 ng/mL — ABNORMAL LOW (ref 0.8–2.0)

## 2018-06-15 MED ORDER — ISOSORB DINITRATE-HYDRALAZINE 20-37.5 MG PO TABS: 0.5000 | ORAL_TABLET | Freq: Three times a day (TID) | ORAL | 3 refills | Status: DC

## 2018-06-15 MED FILL — BIDIL TABLET: 20-37.5 | 30 days supply | Qty: 45 | Fill #0

## 2018-06-15 NOTE — Progress Notes (Signed)
  Echocardiogram 2D Echocardiogram has been performed.  Kevin Cardenas L Androw 06/15/2018, 10:39 AM

## 2018-06-15 NOTE — Patient Instructions (Signed)
Start Bidil 1/2 tablet three times daily.  Routine lab work today. Will notify you of abnormal results  Follow up with pharmacy in 3 weeks.  Follow up with Dr.McLean in 3 months.

## 2018-06-16 NOTE — Progress Notes (Signed)
Patient ID: Kevin Cardenas, male   DOB: 08/17/1975, 43 y.o.   MRN: [id number] PCP: [name], MD Cardiology: [name] EP: Dr Caryl Comes  [name] is a 43 y.o. gentlemen with history of VSD repair and removal of a pulmonary artery band at age 55, DM, HTN, systolic heart failure, NICM and OSA diagnosed in past but not on CPAP. Has St Jude ICD (placed 01/2015) .   He returns for CHF followup.  Symptomatically, he seems to be stable. Weight is down 1 lb. He is short of breath after walking 2 blocks or walking up a flight of stairs.  No orthopnea/PND. No chest pain. Tolerating meds, no lightheadedness.   Labs (1/16): K 4.1, creatinine 0.9, TSH normal, LDL 89, HDL 46 Labs (05/24/2014): K 4.1 Creatinine 0.97  Labs (3/16): BNP 54, K 4.4, creatinine 1.0 Labs 08/14/2014: K 4.0 Creatinine 0.93  Labs (6/16): digoxin < 0.2 Labs (9/16): K 3.7, creatinine 0.93, digoxin < 0.5, HCT 40.4 Labs (01/2016): dig 0.5 K 4.1 Creatinine 0.94  Labs (09/2017): K 4.3 Creatinine 0.95  ECG (personally reviewed): NSR, LVH with widening (QRS 138 msec)  St Jude device interrogated: No VT, stable thoracic impedance.   PMH: 1. VSD repair at 1.  2. Type 2 diabetes. 3. HTN 4. OSA: Not using CPAP.  5. Chronic systolic CHF: Nonischemic cardiomyopathy.  St Jude ICD.  - Echo 2009: EF 35-40% - LHC 2009: No angiographic coronary disease.  - Echo 9/11: LVEF 20-25%  - Echo 11/12: LVEF 30-35%, diffuse hypokinesis and mild LVH. RV systolic mildly reduced. LA mildly dilated. Pulmonic valve with mild stenosis.  - Echo 05/12/2014: EF 15-20% Grade IDD, LV moderately dilated.  RV normal. Mild pulmonc stenosis mean peak gradient 14.  - Echo 7/16: EF 25-30%, mild LV dilation, mild LVH, trivial pulmonic stenosis with peak gradient 11 mmHg, mildly decreased RV function with mild RV dilation.   - CPX (3/16): RER 1.03, peak VO2 19.9, VE/VCO2 slope 18, mild to moderately impaired functional capacity.  There was moderate to severe mixed  restrictive/obstructive pattern on spirometry => exertional hypoxemia, oxygen saturation decreased to 84%.  - RHC (4/16): mean RA 9, PA 35/12, mean PCWP 8, CI 1.8 Fick, CI 2.4 thermo.  - Echo 02/2106: EF 30-35% Grade IIDD  - Echo 2/20: EF 25%, mild LV dilation, diffuse hypokinesis, mildly decreased RV systolic function with mild RV dilation, no definite PS noted.  6. Pulmonic stenosis: Mild in past, not significant on 2/20 echo.   ROS: All systems negative except as listed in HPI, PMH and Problem List.  Current Outpatient Medications  Medication Sig Dispense Refill  . albuterol (PROVENTIL) (2.5 MG/3ML) 0.083% nebulizer solution Take 3 mLs (2.5 mg total) by nebulization every 6 (six) hours as needed for wheezing or shortness of breath. 75 mL 11  . aspirin 81 MG chewable tablet Chew 1 tablet (81 mg total) by mouth daily. 30 tablet 0  . carvedilol (COREG) 25 MG tablet TAKE 1 TABLET BY MOUTH 2 TIMES DAILY WITH A MEAL. 90 tablet 0  . digoxin (LANOXIN) 0.125 MG tablet TAKE 1 TABLET (125 MCG TOTAL) BY MOUTH DAILY. 90 tablet 2  . ENTRESTO 97-103 MG TAKE 1 TABLET BY MOUTH 2 TIMES DAILY. 60 tablet 3  . fluticasone furoate-vilanterol (BREO ELLIPTA) 200-25 MCG/INH AEPB Inhale 1 puff into the lungs daily. 30 each 11  . furosemide (LASIX) 40 MG tablet TAKE 1 TABLET (40 MG TOTAL) BY MOUTH 2 (TWO) TIMES DAILY. 180 tablet 2  .  simvastatin (ZOCOR) 20 MG tablet Take 1 tablet (20 mg total) by mouth at bedtime. 30 tablet 5  . spironolactone (ALDACTONE) 25 MG tablet TAKE 1 TABLET (25 MG TOTAL) BY MOUTH DAILY. 90 tablet 2  . isosorbide-hydrALAZINE (BIDIL) 20-37.5 MG tablet Take 0.5 tablets by mouth 3 (three) times daily. 45 tablet 3   No current facility-administered medications for this encounter.     PHYSICAL EXAM: Vitals:   06/15/18 1044  BP: 122/74  Pulse: 79  SpO2: 95%  Weight: 102.9 kg (226 lb 12.8 oz)   Wt Readings from Last 3 Encounters:  06/15/18 102.9 kg (226 lb 12.8 oz)  03/17/18 104.3 kg (230  lb)  03/15/18 103.1 kg (227 lb 6.4 oz)   General: NAD Neck: No JVD, no thyromegaly or thyroid nodule.  Lungs: Clear to auscultation bilaterally with normal respiratory effort. CV: Nondisplaced PMI.  Heart regular S1/S2, no S3/S4, 1/6 SEM LUSB.  No peripheral edema.  No carotid bruit.  Normal pedal pulses.  Abdomen: Soft, nontender, no hepatosplenomegaly, no distention.  Skin: Intact without lesions or rashes.  Neurologic: Alert and oriented x 3.  Psych: Normal affect. Extremities: No clubbing or cyanosis.  HEENT: Normal.   ASSESSMENT & PLAN: 1. Chronic systolic CHF: Nonischemic cardiomyopathy.  RHC in 4/16 showed preserved cardiac index by thermodilution but low by Fick.   Echo was done today and reviewed, showing mildly dilated LV, EF 25%, mildly decreased RV systolic function, no definite pulmonic stenosis.   He has a [employer] officer, political party ICD (QRS not wide enough to be likely to benefit from CRT). NYHA II, not volume overloaded on exam.  - Continue Coreg 25 mg bid and Entresto 97/103 bid.   - Continue spironolactone 25 mg daily.  - Continue digoxin 0.125 and check level.   - I will add Bidil 1/2 tab tid to his regimen.  -  Check BMET.  2. H/o VSD: No residual shunt noted on today's echo.  Last RHC also did not suggest any significant left to right shunting.  3. Pulmonary stenosis: Very mild by 7/16 echo, no significant stenosis on today's echo.  4. OSA: stressed the importance of nightly CPAP. Encouraged to use CPAP nightly.      Followup in 3 months.   [name] 06/16/2018

## 2018-06-17 ENCOUNTER — Emergency Department (HOSPITAL_COMMUNITY)
Admission: EM | Admit: 2018-06-17 | Discharge: 2018-06-17 | Disposition: A | Discharge status: Home / Self Care | Payer: Medicare HMO | Attending: Emergency Medicine | Admitting: Emergency Medicine

## 2018-06-17 ENCOUNTER — Encounter (HOSPITAL_COMMUNITY): Payer: Self-pay | Admitting: Emergency Medicine

## 2018-06-17 DIAGNOSIS — M545 Low back pain, unspecified: Secondary | ICD-10-CM

## 2018-06-17 DIAGNOSIS — I5022 Chronic systolic (congestive) heart failure: Secondary | ICD-10-CM | POA: Insufficient documentation

## 2018-06-17 DIAGNOSIS — Z7982 Long term (current) use of aspirin: Secondary | ICD-10-CM | POA: Insufficient documentation

## 2018-06-17 DIAGNOSIS — Z79899 Other long term (current) drug therapy: Secondary | ICD-10-CM | POA: Diagnosis not present

## 2018-06-17 DIAGNOSIS — Z87891 Personal history of nicotine dependence: Secondary | ICD-10-CM | POA: Insufficient documentation

## 2018-06-17 DIAGNOSIS — I110 Hypertensive heart disease with heart failure: Secondary | ICD-10-CM | POA: Insufficient documentation

## 2018-06-17 MED ORDER — NAPROXEN 500 MG PO TABS: 500.0000 mg | ORAL_TABLET | Freq: Two times a day (BID) | ORAL | 0 refills | Status: DC

## 2018-06-17 MED ORDER — NAPROXEN 375 MG PO TABS
375.0000 mg | ORAL_TABLET | Freq: Once | ORAL | Status: AC
  Administered 2018-06-17: 375 mg via ORAL
  Filled 2018-06-17: qty 1
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
## 2018-06-17 NOTE — Discharge Instructions (Signed)
Take naproxen 2 times a day with meals.  Do not take other anti-inflammatories at the same time (Advil, Motrin, ibuprofen, Aleve). You may supplement with Tylenol if you need further pain control. Use also cream such as salon poss, icy hot, BenGay as needed for muscle pain. Use ice packs or heating pads if this helps control your pain. You will likely have continued muscle stiffness and soreness over the next couple days.  Follow-up with primary care in 1 week if your symptoms are not improving. Return to the emergency room if you develop vision changes, vomiting, slurred speech, numbness, loss of bowel or bladder control, or any new or worsening symptoms.
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
## 2018-07-05 ENCOUNTER — Inpatient Hospital Stay (HOSPITAL_COMMUNITY): Admission: RE | Admit: 2018-07-05 | Payer: Medicare HMO | Source: Ambulatory Visit

## 2018-07-09 ENCOUNTER — Telehealth (HOSPITAL_COMMUNITY): Payer: Self-pay | Admitting: Pharmacist

## 2018-07-09 NOTE — Telephone Encounter (Signed)
Patient could not come to clinic 3/23 d/t COVID restrictions.  Called patient to review medications and check how feeling - LMOM will retry on Monday.   Bonnita Nasuti Pharm.D. CPP, BCPS Clinical Pharmacist [phone] 07/09/2018 7:02 PM

## 2018-07-21 ENCOUNTER — Ambulatory Visit (INDEPENDENT_AMBULATORY_CARE_PROVIDER_SITE_OTHER): Payer: Medicare HMO | Admitting: *Deleted

## 2018-07-21 ENCOUNTER — Other Ambulatory Visit: Payer: Self-pay

## 2018-07-21 DIAGNOSIS — I5022 Chronic systolic (congestive) heart failure: Secondary | ICD-10-CM

## 2018-07-21 DIAGNOSIS — I428 Other cardiomyopathies: Secondary | ICD-10-CM

## 2018-07-21 LAB — CUP PACEART REMOTE DEVICE CHECK
Date Time Interrogation Session: 20200408092418
Implantable Lead Implant Date: 20161007
Implantable Lead Location: 753860
Implantable Lead Model: 181
Implantable Lead Serial Number: 331179
Implantable Pulse Generator Implant Date: 20161007
Pulse Gen Serial Number: 7286706

## 2018-07-30 ENCOUNTER — Encounter: Payer: Self-pay | Admitting: Cardiology

## 2018-07-30 NOTE — Progress Notes (Signed)
Remote ICD transmission.   

## 2018-09-16 ENCOUNTER — Telehealth: Payer: Self-pay | Admitting: Cardiology

## 2018-09-16 NOTE — Telephone Encounter (Signed)
Called [facility] mail order pharmacy to inform them that they needed to contact pt's PCP concerning the requested items to be refilled. Representative verbalized understanding.

## 2018-09-16 NOTE — Telephone Encounter (Signed)
[facility] reached out this morning to confirm a refill for Diabetic Testing Supplies for the patient.  The patient told the pharmacy that his provider was Dr. [name]. According to his chart, he was also treated by Dr. Virl Axe.   The pharmacy would like confirmation on: BD Single Use Alcohol swabs Accu-Chek  Aviva Meter Accu-Chek Aviva Plus Test Strips Accu-Chek Soft clix lancets Accu-Chek Aviva Control Solution.   Please verify for the Pharmacy

## 2018-09-17 ENCOUNTER — Telehealth (HOSPITAL_COMMUNITY): Payer: Self-pay

## 2018-09-17 ENCOUNTER — Ambulatory Visit (HOSPITAL_COMMUNITY)
Admission: RE | Admit: 2018-09-17 | Discharge: 2018-09-17 | Disposition: A | Discharge status: Home / Self Care | Payer: Medicare HMO | Source: Ambulatory Visit | Attending: Cardiology | Admitting: Cardiology

## 2018-09-17 ENCOUNTER — Other Ambulatory Visit: Payer: Self-pay

## 2018-09-17 DIAGNOSIS — I5022 Chronic systolic (congestive) heart failure: Secondary | ICD-10-CM

## 2018-09-17 MED ORDER — ISOSORB DINITRATE-HYDRALAZINE 20-37.5 MG PO TABS: 1.0000 | ORAL_TABLET | Freq: Three times a day (TID) | ORAL | 3 refills | Status: DC

## 2018-09-17 MED FILL — BIDIL TABLET: 20-37.5 | 15 days supply | Qty: 45 | Fill #0

## 2018-09-17 NOTE — Telephone Encounter (Signed)
1. Increase Bidil to 1 tab tid. /script sent 2. needs to come in for BMET and digoxin level. /sched 10 June _0  3. Followup in office in 3 months. /sched 4 sep _1   LVM to call back if those appts did not work for him or with any questions.

## 2018-09-18 NOTE — Progress Notes (Signed)
Heart Failure TeleHealth Note  Due to national recommendations of social distancing due to [place] 19, Audio/video telehealth visit is felt to be most appropriate for this patient at this time.  See MyChart message from today for patient consent regarding telehealth for [facility].  Date:  09/18/2018   ID:  Kevin Cardenas, DOB 1975/09/01, MRN [id number]  Location: Home  Provider location: [place] [facility] Type of Visit: Established patient   PCP:  [name], MD  Cardiologist:  [name]  Chief Complaint: Mild shortness of breath   History of Present Illness: Kevin Cardenas is a 43 y.o. male who presents via audio/video conferencing for a telehealth visit today.     he denies symptoms worrisome for COVID 19.   Patient has a history of VSD repair and removal of a pulmonary artery band at age 25, DM, HTN, systolic heart failure, NICM and OSA diagnosed in past but not on CPAP. Has St Jude ICD (placed 01/2015).   He has been stable symptomatically.  Taking all meds and tolerating them.  No lightheadedness.  He takes his time walking but generally does ok walking on flat ground or up a flight of stairs.  No orthopnea/PND.  No chest pain.  No palpitations.  He is using CPAP.   Labs (1/16): K 4.1, creatinine 0.9, TSH normal, LDL 89, HDL 46 Labs (05/24/2014): K 4.1 Creatinine 0.97  Labs (3/16): BNP 54, K 4.4, creatinine 1.0 Labs 08/14/2014: K 4.0 Creatinine 0.93  Labs (6/16): digoxin < 0.2 Labs (9/16): K 3.7, creatinine 0.93, digoxin < 0.5, HCT 40.4 Labs (01/2016): dig 0.5 K 4.1 Creatinine 0.94  Labs (09/2017): K 4.3 Creatinine 0.95 Labs (3/20): K 4.3, creatinine 1.02, digoxin < 0.2, LDL 79  PMH: 1. VSD repair at 1.  2. Type 2 diabetes. 3. HTN 4. OSA: Not using CPAP.  5. Chronic systolic CHF: Nonischemic cardiomyopathy.  St Jude ICD.  - Echo 2009: EF 35-40% - LHC 2009: No angiographic coronary disease.  - Echo 9/11: LVEF 20-25%  - Echo 11/12: LVEF  30-35%, diffuse hypokinesis and mild LVH. RV systolic mildly reduced. LA mildly dilated. Pulmonic valve with mild stenosis.  - Echo 05/12/2014: EF 15-20% Grade IDD, LV moderately dilated.  RV normal. Mild pulmonc stenosis mean peak gradient 14.  - Echo 7/16: EF 25-30%, mild LV dilation, mild LVH, trivial pulmonic stenosis with peak gradient 11 mmHg, mildly decreased RV function with mild RV dilation.   - CPX (3/16): RER 1.03, peak VO2 19.9, VE/VCO2 slope 18, mild to moderately impaired functional capacity.  There was moderate to severe mixed restrictive/obstructive pattern on spirometry => exertional hypoxemia, oxygen saturation decreased to 84%.  - RHC (4/16): mean RA 9, PA 35/12, mean PCWP 8, CI 1.8 Fick, CI 2.4 thermo.  - Echo 02/2106: EF 30-35% Grade IIDD  - Echo 2/20: EF 25%, mild LV dilation, diffuse hypokinesis, mildly decreased RV systolic function with mild RV dilation, no definite PS noted.  6. Pulmonic stenosis: Mild in past, not significant on 2/20 echo.   Current Outpatient Medications  Medication Sig Dispense Refill  . albuterol (PROVENTIL) (2.5 MG/3ML) 0.083% nebulizer solution Take 3 mLs (2.5 mg total) by nebulization every 6 (six) hours as needed for wheezing or shortness of breath. 75 mL 11  . aspirin 81 MG chewable tablet Chew 1 tablet (81 mg total) by mouth daily. 30 tablet 0  . carvedilol (COREG) 25 MG tablet TAKE 1 TABLET BY MOUTH 2 TIMES  DAILY WITH A MEAL. 90 tablet 0  . digoxin (LANOXIN) 0.125 MG tablet TAKE 1 TABLET (125 MCG TOTAL) BY MOUTH DAILY. 90 tablet 2  . ENTRESTO 97-103 MG TAKE 1 TABLET BY MOUTH 2 TIMES DAILY. 60 tablet 3  . fluticasone furoate-vilanterol (BREO ELLIPTA) 200-25 MCG/INH AEPB Inhale 1 puff into the lungs daily. 30 each 11  . furosemide (LASIX) 40 MG tablet TAKE 1 TABLET (40 MG TOTAL) BY MOUTH 2 (TWO) TIMES DAILY. 180 tablet 2  . isosorbide-hydrALAZINE (BIDIL) 20-37.5 MG tablet Take 1 tablet by mouth 3 (three) times daily. 45 tablet 3  . naproxen  (NAPROSYN) 500 MG tablet Take 1 tablet (500 mg total) by mouth 2 (two) times daily with a meal. 14 tablet 0  . simvastatin (ZOCOR) 20 MG tablet Take 1 tablet (20 mg total) by mouth at bedtime. 30 tablet 5  . spironolactone (ALDACTONE) 25 MG tablet TAKE 1 TABLET (25 MG TOTAL) BY MOUTH DAILY. 90 tablet 2   No current facility-administered medications for this encounter.     Allergies:   Patient has no known allergies.   Social History:  The patient  reports that he quit smoking about 7 years ago. His smoking use included cigarettes. He has a 4.50 pack-year smoking history. He has never used smokeless tobacco. He reports that he does not drink alcohol or use drugs.   Family History:  The patient's family history includes Diabetes in his maternal grandfather and paternal grandmother; Lung cancer in his father; Stroke in his father and mother.   ROS:  Please see the history of present illness.   All other systems are personally reviewed and negative.   Exam:  (Video/Tele Health Call; Exam is subjective and or/visual.) General:  Speaks in full sentences. No resp difficulty. Lungs: Normal respiratory effort with conversation.  Abdomen: Non-distended per patient report Extremities: Pt denies edema. Neuro: Alert & oriented x 3.   Recent Labs: 06/15/2018: BUN 12; Creatinine, Ser 1.02; Potassium 4.3; Sodium 138  Personally reviewed   Wt Readings from Last 3 Encounters:  06/15/18 102.9 kg (226 lb 12.8 oz)  03/17/18 104.3 kg (230 lb)  03/15/18 103.1 kg (227 lb 6.4 oz)      ASSESSMENT AND PLAN:  1. Chronic systolic CHF: Nonischemic cardiomyopathy.  RHC in 4/16 showed preserved cardiac index by thermodilution but low by Fick.   Last Echo in 2/20 showed mildly dilated LV, EF 25%, mildly decreased RV systolic function, no definite pulmonic stenosis.   He has a Research officer, political party ICD (QRS not wide enough to be likely to benefit from CRT). NYHA II, suspect euvolemic.  - Continue Coreg 25 mg bid and Entresto  97/103 bid.   - Continue spironolactone 25 mg daily.  - Continue digoxin 0.125 and check level.   - Increase Bidil to 1 tab tid.  - Check BMET.  2. H/o VSD: No residual shunt noted 2/20 echo.  Last RHC also did not suggest any significant left to right shunting.  3. Pulmonary stenosis: Very mild by 7/16 echo, no significant stenosis 2/20 echo.  4. OSA: stressed the importance of nightly CPAP. Encouraged to use CPAP nightly.      COVID screen The patient does not have any symptoms that suggest any further testing/ screening at this time.  Social distancing reinforced today.  Patient Risk: After full review of this patients clinical status, I feel that they are at moderate risk for cardiac decompensation at this time.  Relevant cardiac medications were reviewed at length  with the patient today. The patient does not have concerns regarding their medications at this time.   Recommended follow-up:  3 months  Today, I have spent 17 minutes with the patient with telehealth technology discussing the above issues .    Signed, [name], MD  09/18/2018  [place] [address] Heart and [place] [address] [phone] (office) [phone] (fax)

## 2018-09-20 ENCOUNTER — Other Ambulatory Visit (HOSPITAL_COMMUNITY): Payer: Self-pay | Admitting: Cardiology

## 2018-09-20 ENCOUNTER — Other Ambulatory Visit: Payer: Self-pay | Admitting: Internal Medicine

## 2018-09-20 DIAGNOSIS — I5022 Chronic systolic (congestive) heart failure: Secondary | ICD-10-CM

## 2018-09-20 MED FILL — SIMVASTATIN 20 MG TABLET: 20 | 30 days supply | Qty: 30 | Fill #1

## 2018-09-20 MED FILL — SPIRONOLACTONE 25 MG TABLET: 25 | 90 days supply | Qty: 90 | Fill #1

## 2018-09-20 MED FILL — DIGOXIN 0.125 MG TABLET: 125 | 90 days supply | Qty: 90 | Fill #1

## 2018-09-20 MED FILL — FUROSEMIDE 40 MG TAB: 40 | 90 days supply | Qty: 180 | Fill #1

## 2018-09-21 ENCOUNTER — Other Ambulatory Visit: Payer: Self-pay | Admitting: Internal Medicine

## 2018-09-21 DIAGNOSIS — I5022 Chronic systolic (congestive) heart failure: Secondary | ICD-10-CM

## 2018-09-21 MED ORDER — CARVEDILOL 25 MG PO TABS: ORAL_TABLET | ORAL | 1 refills | Status: DC

## 2018-09-21 MED FILL — ENTRESTO 97 MG-103 MG TAB: 97-103 | 30 days supply | Qty: 60 | Fill #0

## 2018-09-21 MED FILL — CARVEDILOL 25 MG TABLET: 25 | 45 days supply | Qty: 90 | Fill #0

## 2018-09-22 ENCOUNTER — Other Ambulatory Visit (HOSPITAL_COMMUNITY): Payer: Medicare HMO

## 2018-10-20 ENCOUNTER — Ambulatory Visit (INDEPENDENT_AMBULATORY_CARE_PROVIDER_SITE_OTHER): Payer: Medicare HMO | Admitting: *Deleted

## 2018-10-20 DIAGNOSIS — I428 Other cardiomyopathies: Secondary | ICD-10-CM | POA: Diagnosis not present

## 2018-10-20 DIAGNOSIS — I5022 Chronic systolic (congestive) heart failure: Secondary | ICD-10-CM

## 2018-10-20 LAB — CUP PACEART REMOTE DEVICE CHECK
Date Time Interrogation Session: 20200708120425
Implantable Lead Implant Date: 20161007
Implantable Lead Location: 753860
Implantable Lead Model: 181
Implantable Lead Serial Number: 331179
Implantable Pulse Generator Implant Date: 20161007
Pulse Gen Serial Number: 7286706

## 2018-11-01 ENCOUNTER — Encounter: Payer: Self-pay | Admitting: Cardiology

## 2018-11-01 NOTE — Progress Notes (Signed)
Remote ICD transmission.   

## 2018-11-12 ENCOUNTER — Telehealth: Payer: Self-pay

## 2018-11-12 NOTE — Telephone Encounter (Signed)
Several refills rq from [facility] came in.   LOV with PCP was in 2017.  Overdue for appt.   LVM for pt.

## 2018-12-17 ENCOUNTER — Other Ambulatory Visit: Payer: Self-pay

## 2018-12-17 ENCOUNTER — Ambulatory Visit (HOSPITAL_COMMUNITY)
Admission: RE | Admit: 2018-12-17 | Discharge: 2018-12-17 | Disposition: A | Discharge status: Home / Self Care | Payer: Medicare Other | Source: Ambulatory Visit | Attending: Cardiology | Admitting: Cardiology

## 2018-12-17 ENCOUNTER — Encounter (HOSPITAL_COMMUNITY): Payer: Self-pay | Admitting: Cardiology

## 2018-12-17 VITALS — BP 130/89 | HR 95 | Wt 233.6 lb

## 2018-12-17 DIAGNOSIS — I428 Other cardiomyopathies: Secondary | ICD-10-CM | POA: Insufficient documentation

## 2018-12-17 DIAGNOSIS — Z7982 Long term (current) use of aspirin: Secondary | ICD-10-CM | POA: Diagnosis not present

## 2018-12-17 DIAGNOSIS — I5022 Chronic systolic (congestive) heart failure: Secondary | ICD-10-CM | POA: Diagnosis not present

## 2018-12-17 DIAGNOSIS — G4733 Obstructive sleep apnea (adult) (pediatric): Secondary | ICD-10-CM | POA: Insufficient documentation

## 2018-12-17 DIAGNOSIS — Q210 Ventricular septal defect: Secondary | ICD-10-CM

## 2018-12-17 DIAGNOSIS — I110 Hypertensive heart disease with heart failure: Secondary | ICD-10-CM | POA: Diagnosis not present

## 2018-12-17 DIAGNOSIS — Z87891 Personal history of nicotine dependence: Secondary | ICD-10-CM | POA: Diagnosis not present

## 2018-12-17 DIAGNOSIS — Z8774 Personal history of (corrected) congenital malformations of heart and circulatory system: Secondary | ICD-10-CM | POA: Insufficient documentation

## 2018-12-17 DIAGNOSIS — Z95810 Presence of automatic (implantable) cardiac defibrillator: Secondary | ICD-10-CM | POA: Diagnosis not present

## 2018-12-17 DIAGNOSIS — R0602 Shortness of breath: Secondary | ICD-10-CM | POA: Diagnosis not present

## 2018-12-17 DIAGNOSIS — Z79899 Other long term (current) drug therapy: Secondary | ICD-10-CM | POA: Insufficient documentation

## 2018-12-17 DIAGNOSIS — Z7901 Long term (current) use of anticoagulants: Secondary | ICD-10-CM | POA: Insufficient documentation

## 2018-12-17 LAB — BASIC METABOLIC PANEL
Anion gap: 8 (ref 5–15)
BUN: 15 mg/dL (ref 6–20)
CO2: 26 mmol/L (ref 22–32)
Calcium: 8.9 mg/dL (ref 8.9–10.3)
Chloride: 106 mmol/L (ref 98–111)
Creatinine, Ser: 0.9 mg/dL (ref 0.61–1.24)
GFR calc Af Amer: >60 mL/min (ref >60)
GFR calc non Af Amer: >60 mL/min (ref >60)
Glucose, Bld: 97 mg/dL (ref 70–99)
Potassium: 4.6 mmol/L (ref 3.5–5.1)
Sodium: 140 mmol/L (ref 135–145)

## 2018-12-17 LAB — DIGOXIN LEVEL: Digoxin Level: <0.2 ng/mL — ABNORMAL LOW (ref 0.8–2.0)

## 2018-12-17 MED ORDER — BIDIL 20-37.5 MG PO TABS: 2.0000 | ORAL_TABLET | Freq: Three times a day (TID) | ORAL | 3 refills | Status: DC

## 2018-12-17 MED ORDER — FUROSEMIDE 40 MG PO TABS: ORAL_TABLET | ORAL | 3 refills | Status: DC

## 2018-12-17 MED FILL — FUROSEMIDE 40 MG TAB: 40 | 90 days supply | Qty: 225 | Fill #0

## 2018-12-17 NOTE — Patient Instructions (Addendum)
INCREASE Bidil to 2 tabs by mouth three times daily INCREASE Lasix to 60 mg (one and one half tab) twice a day for 3 days, then start 60 mg in the AM and 40 mg in the PM   Be sure to only take Tyelnol as needed of pain/headaches. Discontinue Ibuprofen  Labs today We will only contact you if something comes back abnormal or we need to make some changes. Otherwise no news is good news!  Labs needed in 10 days  Your physician has recommended that you have a cardiopulmonary stress test (CPX). CPX testing is a non-invasive measurement of heart and lung function. It replaces a traditional treadmill stress test. This type of test provides a tremendous amount of information that relates not only to your present condition but also for future outcomes. This test combines measurements of you ventilation, respiratory gas exchange in the lungs, electrocardiogram (EKG), blood pressure and physical response before, during, and following an exercise protocol.    Your physician recommends that you schedule a follow-up appointment in: 3 months with Dr Aundra Dubin  Do the following things EVERYDAY: 1) Weigh yourself in the morning before breakfast. Write it down and keep it in a log. 2) Take your medicines as prescribed 3) Eat low salt foods-Limit salt (sodium) to 2000 mg per day.  4) Stay as active as you can everyday 5) Limit all fluids for the day to less than 2 liters   At the [facility], you and your health needs are our priority. As part of our continuing mission to provide you with exceptional heart care, we have created designated Provider Care Teams. These Care Teams include your primary Cardiologist (physician) and Advanced Practice Providers (APPs- Physician Assistants and Nurse Practitioners) who all work together to provide you with the care you need, when you need it.   You may see any of the following providers on your designated Care Team at your next follow up: Marland Kitchen Dr Glori Bickers . Dr [name] . Darrick Grinder, NP   Please be sure to bring in all your medications bottles to every appointment.

## 2018-12-19 NOTE — Progress Notes (Signed)
Date:  12/19/2018   ID:  Kevin Cardenas, DOB [birth year], MRN [id number]  Provider location: [facility] Type of Visit: Established patient   PCP:  [name], MD  Cardiologist:  [name]   History of Present Illness: Kevin Cardenas is a 43 y.o. male who has a history of VSD repair and removal of a pulmonary artery band at age 12, DM, HTN, systolic heart failure, NICM and OSA diagnosed in past but not on CPAP. Has St Jude ICD (placed 01/2015).   Echo in 3/20 showed EF 25%, mild LV dilation, mildly decreased RV systolic function.   He returns for followup of CHF. Using CPAP at night. He has been having sciatica-type pain, taking ibuprofen.  He is short of breath after walking 2 blocks.  No orthopnea/PND.  No lightheadedness. No chest pain.  Weight is up 6 lbs.   Labs (1/16): K 4.1, creatinine 0.9, TSH normal, LDL 89, HDL 46 Labs (05/24/2014): K 4.1 Creatinine 0.97  Labs (3/16): BNP 54, K 4.4, creatinine 1.0 Labs 08/14/2014: K 4.0 Creatinine 0.93  Labs (6/16): digoxin < 0.2 Labs (9/16): K 3.7, creatinine 0.93, digoxin < 0.5, HCT 40.4 Labs (01/2016): dig 0.5 K 4.1 Creatinine 0.94  Labs (09/2017): K 4.3 Creatinine 0.95 Labs (3/20): K 4.3, creatinine 1.02, digoxin < 0.2, LDL 79  PMH: 1. VSD repair at 2.  2. Type 2 diabetes. 3. HTN 4. OSA: Not using CPAP.  5. Chronic systolic CHF: Nonischemic cardiomyopathy.  St Jude ICD.  - Echo 2009: EF 35-40% - LHC 2009: No angiographic coronary disease.  - Echo 9/11: LVEF 20-25%  - Echo 11/12: LVEF 30-35%, diffuse hypokinesis and mild LVH. RV systolic mildly reduced. LA mildly dilated. Pulmonic valve with mild stenosis.  - Echo 05/12/2014: EF 15-20% Grade IDD, LV moderately dilated.  RV normal. Mild pulmonc stenosis mean peak gradient 14.  - Echo 7/16: EF 25-30%, mild LV dilation, mild LVH, trivial pulmonic stenosis with peak gradient 11 mmHg, mildly decreased RV function with mild RV dilation.   - CPX (3/16): RER  1.03, peak VO2 19.9, VE/VCO2 slope 18, mild to moderately impaired functional capacity.  There was moderate to severe mixed restrictive/obstructive pattern on spirometry => exertional hypoxemia, oxygen saturation decreased to 84%.  - RHC (4/16): mean RA 9, PA 35/12, mean PCWP 8, CI 1.8 Fick, CI 2.4 thermo.  - Echo 02/2106: EF 30-35% Grade IIDD  - Echo 3/20: EF 25%, mild LV dilation, diffuse hypokinesis, mildly decreased RV systolic function with mild RV dilation, no definite PS noted.  6. Pulmonic stenosis: Mild in past, not significant on 2/20 echo.   Current Outpatient Medications  Medication Sig Dispense Refill  . albuterol (PROVENTIL) (2.5 MG/3ML) 0.083% nebulizer solution Take 3 mLs (2.5 mg total) by nebulization every 6 (six) hours as needed for wheezing or shortness of breath. 75 mL 11  . aspirin 81 MG chewable tablet Chew 1 tablet (81 mg total) by mouth daily. 30 tablet 0  . carvedilol (COREG) 25 MG tablet TAKE 1 TABLET BY MOUTH 2 TIMES DAILY WITH A MEAL. Please make yearly appt with Dr. Caryl Comes for December for future refills. 1st attempt 90 tablet 1  . digoxin (LANOXIN) 0.125 MG tablet TAKE 1 TABLET (125 MCG TOTAL) BY MOUTH DAILY. 90 tablet 2  . ENTRESTO 97-103 MG TAKE 1 TABLET BY MOUTH 2 TIMES DAILY. 60 tablet 11  . fluticasone furoate-vilanterol (BREO ELLIPTA) 200-25 MCG/INH AEPB Inhale 1 puff into the  lungs daily. 30 each 11  . furosemide (LASIX) 40 MG tablet Take 1.5 tablets (60 mg total) by mouth every morning AND 1 tablet (40 mg total) every evening. 225 tablet 3  . isosorbide-hydrALAZINE (BIDIL) 20-37.5 MG tablet Take 2 tablets by mouth 3 (three) times daily. 180 tablet 3  . spironolactone (ALDACTONE) 25 MG tablet TAKE 1 TABLET (25 MG TOTAL) BY MOUTH DAILY. 90 tablet 2   No current facility-administered medications for this encounter.     Allergies:   Patient has no known allergies.   Social History:  The patient  reports that he quit smoking about 7 years ago. His smoking use  included cigarettes. He has a 4.50 pack-year smoking history. He has never used smokeless tobacco. He reports that he does not drink alcohol or use drugs.   Family History:  The patient's family history includes Diabetes in his maternal grandfather and paternal grandmother; Lung cancer in his father; Stroke in his father and mother.   ROS:  Please see the history of present illness.   All other systems are personally reviewed and negative.   Exam:   General: NAD Neck: JVP 8 cm with HJR, no thyromegaly or thyroid nodule.  Lungs: Clear to auscultation bilaterally with normal respiratory effort. CV: Nondisplaced PMI.  Heart regular S1/S2, no S3/S4, 2/6 SEM RUSB.  No peripheral edema.  No carotid bruit.  Normal pedal pulses.  Abdomen: Soft, nontender, no hepatosplenomegaly, no distention.  Skin: Intact without lesions or rashes.  Neurologic: Alert and oriented x 3.  Psych: Normal affect. Extremities: No clubbing or cyanosis.  HEENT: Normal.   Recent Labs: 12/17/2018: BUN 15; Creatinine, Ser 0.90; Potassium 4.6; Sodium 140  Personally reviewed   Wt Readings from Last 3 Encounters:  12/17/18 106 kg (233 lb 9.6 oz)  06/15/18 102.9 kg (226 lb 12.8 oz)  03/17/18 104.3 kg (230 lb)      ASSESSMENT AND PLAN:  1. Chronic systolic CHF: Nonischemic cardiomyopathy.  RHC in 4/16 showed preserved cardiac index by thermodilution but low by Fick.   Last Echo in 2/20 showed mildly dilated LV, EF 25%, mildly decreased RV systolic function, no definite pulmonic stenosis.  He has a Research officer, political party ICD (QRS not wide enough to be likely to benefit from CRT). NYHA class II.  He looks mildly volume overloaded, weight is up.  - Continue Coreg 25 mg bid and Entresto 97/103 bid.   - Continue spironolactone 25 mg daily.  - Continue digoxin 0.125 and check level.   - Increase Bidil to 2 tabs tid.  - Increase Lasix 60 mg bid x 3 days then 60 qam/40 qpm.  - I will arrange for CPX.  - Check BMET today and again in 10 days.   - Given CHF, he needs to stop using ibuprofen.  Can use Tylenol for his sciatica pain.  2. H/o VSD: No residual shunt noted 2/20 echo.  Last RHC also did not suggest any significant left to right shunting.  3. Pulmonary stenosis: Very mild by 7/16 echo, no significant stenosis 2/20 echo.  4. OSA: Using CPAP.   Followup in 3 months .  Signed, [name], MD  12/19/2018  [place] [address] Heart and [place] [address] [phone] (office) [phone] (fax)

## 2018-12-21 MED FILL — BIDIL TABLET: 20-37.5 | 30 days supply | Qty: 180 | Fill #0

## 2018-12-27 ENCOUNTER — Other Ambulatory Visit: Payer: Self-pay

## 2018-12-27 ENCOUNTER — Ambulatory Visit (HOSPITAL_COMMUNITY)
Admission: RE | Admit: 2018-12-27 | Discharge: 2018-12-27 | Disposition: A | Discharge status: Home / Self Care | Payer: Medicare Other | Source: Ambulatory Visit | Attending: Internal Medicine | Admitting: Internal Medicine

## 2018-12-27 DIAGNOSIS — I5022 Chronic systolic (congestive) heart failure: Secondary | ICD-10-CM | POA: Diagnosis not present

## 2018-12-27 LAB — BASIC METABOLIC PANEL
Anion gap: 9 (ref 5–15)
BUN: 10 mg/dL (ref 6–20)
CO2: 27 mmol/L (ref 22–32)
Calcium: 8.3 mg/dL — ABNORMAL LOW (ref 8.9–10.3)
Chloride: 104 mmol/L (ref 98–111)
Creatinine, Ser: 0.82 mg/dL (ref 0.61–1.24)
GFR calc Af Amer: >60 mL/min (ref >60)
GFR calc non Af Amer: >60 mL/min (ref >60)
Glucose, Bld: 107 mg/dL — ABNORMAL HIGH (ref 70–99)
Potassium: 3.8 mmol/L (ref 3.5–5.1)
Sodium: 140 mmol/L (ref 135–145)

## 2019-01-07 ENCOUNTER — Telehealth (HOSPITAL_COMMUNITY): Payer: Self-pay | Admitting: *Deleted

## 2019-01-07 NOTE — Telephone Encounter (Signed)
Left VM for pt to call and schedule covid 19 test for cpx

## 2019-01-11 NOTE — Telephone Encounter (Signed)
Left a second message for pt to call me back to schedule covid test before cpx.

## 2019-01-17 ENCOUNTER — Other Ambulatory Visit: Payer: Self-pay

## 2019-01-17 ENCOUNTER — Ambulatory Visit (HOSPITAL_COMMUNITY): Payer: Medicare Other

## 2019-01-19 ENCOUNTER — Ambulatory Visit (INDEPENDENT_AMBULATORY_CARE_PROVIDER_SITE_OTHER): Payer: Medicare Other | Admitting: *Deleted

## 2019-01-19 DIAGNOSIS — I428 Other cardiomyopathies: Secondary | ICD-10-CM | POA: Diagnosis not present

## 2019-01-20 ENCOUNTER — Other Ambulatory Visit: Payer: Self-pay

## 2019-01-20 LAB — CUP PACEART REMOTE DEVICE CHECK
Date Time Interrogation Session: 20201008151755
Implantable Lead Implant Date: 20161007
Implantable Lead Location: 753860
Implantable Lead Model: 181
Implantable Lead Serial Number: 331179
Implantable Pulse Generator Implant Date: 20161007
Pulse Gen Serial Number: 7286706

## 2019-01-31 NOTE — Progress Notes (Signed)
Remote ICD transmission.   

## 2019-02-04 ENCOUNTER — Other Ambulatory Visit (HOSPITAL_COMMUNITY)
Admission: RE | Admit: 2019-02-04 | Discharge: 2019-02-04 | Disposition: A | Discharge status: Home / Self Care | Payer: Medicare Other | Source: Ambulatory Visit | Attending: Cardiology | Admitting: Cardiology

## 2019-02-04 DIAGNOSIS — Z20828 Contact with and (suspected) exposure to other viral communicable diseases: Secondary | ICD-10-CM | POA: Insufficient documentation

## 2019-02-04 DIAGNOSIS — Z01812 Encounter for preprocedural laboratory examination: Secondary | ICD-10-CM | POA: Diagnosis not present

## 2019-02-05 LAB — NOVEL CORONAVIRUS, NAA (HOSP ORDER, SEND-OUT TO REF LAB; TAT 18-24 HRS): SARS-CoV-2, NAA: NOT DETECTED

## 2019-02-07 ENCOUNTER — Other Ambulatory Visit (HOSPITAL_COMMUNITY): Payer: Self-pay | Admitting: *Deleted

## 2019-02-07 ENCOUNTER — Ambulatory Visit (HOSPITAL_COMMUNITY): Payer: Medicare Other | Attending: Cardiology

## 2019-02-07 ENCOUNTER — Other Ambulatory Visit: Payer: Self-pay

## 2019-02-07 DIAGNOSIS — I5022 Chronic systolic (congestive) heart failure: Secondary | ICD-10-CM

## 2019-02-10 ENCOUNTER — Telehealth (HOSPITAL_COMMUNITY): Payer: Self-pay

## 2019-02-10 NOTE — Telephone Encounter (Signed)
Pt aware of results

## 2019-02-10 NOTE — Telephone Encounter (Signed)
-----  Message from Larey Dresser, MD sent at 02/07/2019  5:12 PM EDT ----- Moderate to severe functional limitation, ?related to obesity-hypoventilation syndrome

## 2019-03-18 ENCOUNTER — Telehealth (HOSPITAL_COMMUNITY): Payer: Self-pay | Admitting: *Deleted

## 2019-03-18 NOTE — Telephone Encounter (Signed)
Error.

## 2019-03-23 ENCOUNTER — Ambulatory Visit (HOSPITAL_COMMUNITY)
Admission: RE | Admit: 2019-03-23 | Discharge: 2019-03-23 | Disposition: A | Discharge status: Home / Self Care | Payer: Medicare Other | Source: Ambulatory Visit | Attending: Cardiology | Admitting: Cardiology

## 2019-03-23 ENCOUNTER — Other Ambulatory Visit: Payer: Self-pay

## 2019-03-23 DIAGNOSIS — R942 Abnormal results of pulmonary function studies: Secondary | ICD-10-CM | POA: Diagnosis not present

## 2019-03-23 DIAGNOSIS — J849 Interstitial pulmonary disease, unspecified: Secondary | ICD-10-CM

## 2019-03-23 DIAGNOSIS — I5022 Chronic systolic (congestive) heart failure: Secondary | ICD-10-CM

## 2019-03-23 NOTE — Progress Notes (Signed)
Heart Failure TeleHealth Note  Due to national recommendations of social distancing due to [place] 19, Audio/video telehealth visit is felt to be most appropriate for this patient at this time.  See MyChart message from today for patient consent regarding telehealth for [facility].  Date:  03/23/2019   ID:  Kevin Cardenas, DOB [birth year], MRN [id number]  Location: Home  Provider location: [place] Type of Visit: Established patient  PCP:  [name], MD  Cardiologist:  [name]  Chief Complaint: shortness of breath.   History of Present Illness: Kevin Cardenas is a 42 y.o. male who presents via audio/video conferencing for a telehealth visit today.     he denies symptoms worrisome for COVID 19.   Patient  has a history of VSD repair and removal of a pulmonary artery band at age 52, DM, HTN, systolic heart failure, NICM and OSA diagnosed in past but not on CPAP. Has St Jude ICD (placed 01/2015).   Echo in 3/20 showed EF 25%, mild LV dilation, mildly decreased RV systolic function.   CPX in 10/20 showed moderate functional limitation primarily due to restrictive lung disease.   Weight has generally been stable.  He is taking all his meds.  Nonsmoker.  No dyspnea walking on flat ground, he gets short of breath with more heavy exertion.  OK climbing a flight of stairs.  No orthopnea/PND.   Labs (1/16): K 4.1, creatinine 0.9, TSH normal, LDL 89, HDL 46 Labs (05/24/2014): K 4.1 Creatinine 0.97  Labs (3/16): BNP 54, K 4.4, creatinine 1.0 Labs 08/14/2014: K 4.0 Creatinine 0.93  Labs (6/16): digoxin < 0.2 Labs (9/16): K 3.7, creatinine 0.93, digoxin < 0.5, HCT 40.4 Labs (01/2016): dig 0.5 K 4.1 Creatinine 0.94  Labs (09/2017): K 4.3 Creatinine 0.95 Labs (3/20): K 4.3, creatinine 1.02, digoxin < 0.2, LDL 79 Labs (9/20): K 3.8, creatinine 0.82, digoxin < 0.2  PMH: 1. VSD repair at 1.  2. Type 2 diabetes. 3. HTN 4. OSA: Not using CPAP.  5.  Chronic systolic CHF: Nonischemic cardiomyopathy.  St Jude ICD.  - Echo 2009: EF 35-40% - LHC 2009: No angiographic coronary disease.  - Echo 9/11: LVEF 20-25%  - Echo 11/12: LVEF 30-35%, diffuse hypokinesis and mild LVH. RV systolic mildly reduced. LA mildly dilated. Pulmonic valve with mild stenosis.  - Echo 05/12/2014: EF 15-20% Grade IDD, LV moderately dilated.  RV normal. Mild pulmonc stenosis mean peak gradient 14.  - Echo 7/16: EF 25-30%, mild LV dilation, mild LVH, trivial pulmonic stenosis with peak gradient 11 mmHg, mildly decreased RV function with mild RV dilation.   - CPX (3/16): RER 1.03, peak VO2 19.9, VE/VCO2 slope 18, mild to moderately impaired functional capacity.  There was moderate to severe mixed restrictive/obstructive pattern on spirometry => exertional hypoxemia, oxygen saturation decreased to 84%.  - RHC (4/16): mean RA 9, PA 35/12, mean PCWP 8, CI 1.8 Fick, CI 2.4 thermo.  - Echo 02/2106: EF 30-35% Grade IIDD  - Echo 3/20: EF 25%, mild LV dilation, diffuse hypokinesis, mildly decreased RV systolic function with mild RV dilation, no definite PS noted.  - CPX (10/20): VE/VCO2 16, peak VO2 15.8 (57% predicted), RER 1.08.  Moderate functional limitation primarily due to restrictive lung disease.  Severe restriction on PFTs.  6. Pulmonic stenosis: Mild in past, not significant on 2/20 echo.   Current Outpatient Medications  Medication Sig Dispense Refill  . albuterol (PROVENTIL) (2.5 MG/3ML) 0.083%  nebulizer solution Take 3 mLs (2.5 mg total) by nebulization every 6 (six) hours as needed for wheezing or shortness of breath. 75 mL 11  . aspirin 81 MG chewable tablet Chew 1 tablet (81 mg total) by mouth daily. 30 tablet 0  . carvedilol (COREG) 25 MG tablet TAKE 1 TABLET BY MOUTH 2 TIMES DAILY WITH A MEAL. Please make yearly appt with Dr. Caryl Comes for December for future refills. 1st attempt 90 tablet 1  . digoxin (LANOXIN) 0.125 MG tablet TAKE 1 TABLET (125 MCG TOTAL) BY MOUTH  DAILY. 90 tablet 2  . ENTRESTO 97-103 MG TAKE 1 TABLET BY MOUTH 2 TIMES DAILY. 60 tablet 11  . fluticasone furoate-vilanterol (BREO ELLIPTA) 200-25 MCG/INH AEPB Inhale 1 puff into the lungs daily. 30 each 11  . furosemide (LASIX) 40 MG tablet Take 1.5 tablets (60 mg total) by mouth every morning AND 1 tablet (40 mg total) every evening. 225 tablet 3  . isosorbide-hydrALAZINE (BIDIL) 20-37.5 MG tablet Take 2 tablets by mouth 3 (three) times daily. 180 tablet 3  . spironolactone (ALDACTONE) 25 MG tablet TAKE 1 TABLET (25 MG TOTAL) BY MOUTH DAILY. 90 tablet 2   No current facility-administered medications for this encounter.     Allergies:   Patient has no known allergies.   Social History:  The patient  reports that he quit smoking about 7 years ago. His smoking use included cigarettes. He has a 4.50 pack-year smoking history. He has never used smokeless tobacco. He reports that he does not drink alcohol or use drugs.   Family History:  The patient's family history includes Diabetes in his maternal grandfather and paternal grandmother; Lung cancer in his father; Stroke in his father and mother.   ROS:  Please see the history of present illness.   All other systems are personally reviewed and negative.   Exam:  (Video/Tele Health Call; Exam is subjective and or/visual.) General:  Speaks in full sentences. No resp difficulty. Lungs: Normal respiratory effort with conversation.  Abdomen: Non-distended per patient report Extremities: Pt denies edema. Neuro: Alert & oriented x 3.   Recent Labs: 12/27/2018: BUN 10; Creatinine, Ser 0.82; Potassium 3.8; Sodium 140  Personally reviewed   Wt Readings from Last 3 Encounters:  12/17/18 106 kg (233 lb 9.6 oz)  06/15/18 102.9 kg (226 lb 12.8 oz)  03/17/18 104.3 kg (230 lb)      ASSESSMENT AND PLAN:  1. Chronic systolic CHF: Nonischemic cardiomyopathy.  RHC in 4/16 showed preserved cardiac index by thermodilution but low by Fick.   Last Echo in  2/20 showed mildly dilated LV, EF 25%, mildly decreased RV systolic function, no definite pulmonic stenosis.  He has a Research officer, political party ICD (QRS not wide enough to be likely to benefit from CRT). NYHA class II.  Weight is stable.  CPX with moderate functional limitation but limitation appears to come primarily from the lungs.  - Continue Coreg 25 mg bid and Entresto 97/103 bid.   - Continue spironolactone 25 mg daily.  - Continue digoxin 0.125 and check level.   - Continue Bidil 2 tabs tid.  - Continue Lasix.  60 qam/40 qpm. I will arrange for BMET.  - Given CHF, he should avoid ibuprofen.  Can use Tylenol for sciatica pain.  2. H/o VSD: No residual shunt noted 2/20 echo.  Last RHC also did not suggest any significant left to right shunting.  3. Pulmonary stenosis: Very mild by 7/16 echo, no significant stenosis 2/20 echo.  4.  OSA: Using CPAP.  5. Pulmonary: CPX concerning for interstitial lung disease.  - I will arrange for full PFTs and a high resolution CT chest w/o contrast.  - Refer to pulmonary for evaluation.   COVID screen The patient does not have any symptoms that suggest any further testing/ screening at this time.  Social distancing reinforced today.  Patient Risk: After full review of this patients clinical status, I feel that they are at moderate risk for cardiac decompensation at this time.  Relevant cardiac medications were reviewed at length with the patient today. The patient does not have concerns regarding their medications at this time.   Recommended follow-up:  3 months.   Today, I have spent 18 minutes with the patient with telehealth technology discussing the above issues .    Signed, [name], MD  03/23/2019  [facility] [address] [facility] [place] [address] [phone] (office) [phone] (fax)

## 2019-03-23 NOTE — Patient Instructions (Signed)
Your provider requests you have labwork, pfts, and a high resolution chest CT.  You have been referred to Pulmonology.   Follow up in 3 months.

## 2019-03-28 ENCOUNTER — Ambulatory Visit (HOSPITAL_COMMUNITY): Payer: Medicare Other | Attending: Cardiology

## 2019-04-01 ENCOUNTER — Inpatient Hospital Stay (HOSPITAL_COMMUNITY): Admission: RE | Admit: 2019-04-01 | Payer: Medicare Other | Source: Ambulatory Visit

## 2019-04-05 ENCOUNTER — Encounter (HOSPITAL_COMMUNITY): Payer: Medicare Other

## 2019-04-20 ENCOUNTER — Ambulatory Visit (INDEPENDENT_AMBULATORY_CARE_PROVIDER_SITE_OTHER): Payer: Medicare Other | Admitting: *Deleted

## 2019-04-20 DIAGNOSIS — I428 Other cardiomyopathies: Secondary | ICD-10-CM | POA: Diagnosis not present

## 2019-04-21 LAB — CUP PACEART REMOTE DEVICE CHECK
Battery Remaining Longevity: 66 mo
Battery Remaining Percentage: 62 %
Brady Statistic RV Percent Paced: 0 %
Date Time Interrogation Session: 20210106165914
HighPow Impedance: 79 Ohm
Implantable Lead Implant Date: 20161007
Implantable Lead Location: 753860
Implantable Lead Model: 181
Implantable Lead Serial Number: 331179
Implantable Pulse Generator Implant Date: 20161007
Lead Channel Impedance Value: 390 Ohm
Lead Channel Sensing Intrinsic Amplitude: 9.9 mV
Lead Channel Setting Pacing Amplitude: 2.5 V
Lead Channel Setting Pacing Pulse Width: 0.5 ms
Lead Channel Setting Sensing Sensitivity: 0.5 mV
Pulse Gen Serial Number: 7286706

## 2019-06-13 ENCOUNTER — Ambulatory Visit: Payer: Medicare Other | Attending: Internal Medicine

## 2019-06-13 DIAGNOSIS — Z23 Encounter for immunization: Secondary | ICD-10-CM | POA: Insufficient documentation

## 2019-06-13 NOTE — Progress Notes (Signed)
   Covid-19 Vaccination Clinic  Name:  Kevin Cardenas    MRN: [id number] DOB: July 13, 1975  06/13/2019  [name] was observed post Covid-19 immunization for 15 minutes without incidence. He was provided with Vaccine Information Sheet and instruction to access the V-Safe system.   [name] was instructed to call 911 with any severe reactions post vaccine: Marland Kitchen Difficulty breathing  . Swelling of your face and throat  . A fast heartbeat  . A bad rash all over your body  . Dizziness and weakness    Immunizations Administered    Name Date Dose VIS Date Route   Pfizer COVID-19 Vaccine 06/13/2019  4:39 PM 0.3 mL 03/25/2019 Intramuscular   Manufacturer: [place]   Lot: [id number]   [place]: [id number]

## 2019-06-21 ENCOUNTER — Encounter (HOSPITAL_COMMUNITY): Payer: Medicare Other | Admitting: Cardiology

## 2019-07-06 ENCOUNTER — Ambulatory Visit: Payer: Medicare Other | Attending: Internal Medicine

## 2019-07-06 DIAGNOSIS — Z23 Encounter for immunization: Secondary | ICD-10-CM

## 2019-07-06 NOTE — Progress Notes (Signed)
Covid-19 Vaccination Clinic  Name:  Kevin Cardenas    MRN: [id number] DOB: 1976/02/01  07/06/2019  Mr. Burnside was observed post Covid-19 immunization for 15 minutes without incident. He was provided with Vaccine Information Sheet and instruction to access the V-Safe system.   Mr. Burcher was instructed to call 911 with any severe reactions post vaccine: Marland Kitchen Difficulty breathing  . Swelling of face and throat  . A fast heartbeat  . A bad rash all over body  . Dizziness and weakness   Immunizations Administered    Name Date Dose VIS Date Route   Pfizer COVID-19 Vaccine 07/06/2019  2:54 PM 0.3 mL 03/25/2019 Intramuscular   Manufacturer: [place]   Lot: [id number]   [place]: [id number]

## 2019-07-20 ENCOUNTER — Ambulatory Visit (INDEPENDENT_AMBULATORY_CARE_PROVIDER_SITE_OTHER): Payer: Medicare Other | Admitting: *Deleted

## 2019-07-20 DIAGNOSIS — I428 Other cardiomyopathies: Secondary | ICD-10-CM | POA: Diagnosis not present

## 2019-07-20 LAB — CUP PACEART REMOTE DEVICE CHECK
Battery Remaining Longevity: 62 mo
Battery Remaining Percentage: 60 %
Battery Voltage: 2.96 V
Brady Statistic RV Percent Paced: 1 %
Date Time Interrogation Session: 20210407061248
HighPow Impedance: 66 Ohm
HighPow Impedance: 66 Ohm
Implantable Lead Implant Date: 20161007
Implantable Lead Location: 753860
Implantable Lead Model: 181
Implantable Lead Serial Number: 331179
Implantable Pulse Generator Implant Date: 20161007
Lead Channel Impedance Value: 360 Ohm
Lead Channel Pacing Threshold Amplitude: 0.75 V
Lead Channel Pacing Threshold Pulse Width: 0.5 ms
Lead Channel Sensing Intrinsic Amplitude: 10.4 mV
Lead Channel Setting Pacing Amplitude: 2.5 V
Lead Channel Setting Pacing Pulse Width: 0.5 ms
Lead Channel Setting Sensing Sensitivity: 0.5 mV
Pulse Gen Serial Number: 7286706

## 2019-07-20 NOTE — Progress Notes (Signed)
ICD Remote  

## 2019-07-22 ENCOUNTER — Telehealth: Payer: Self-pay

## 2019-07-22 NOTE — Telephone Encounter (Signed)
Merlin transmission received, noted HR ST 140bpm at time of transmission.  Attempted to reach pt to assess symptoms and meds.  Current meds include Carvedilol 43m BID.  No answer, lvm requesting pt callback.

## 2019-07-28 NOTE — Telephone Encounter (Signed)
LMOVM for patient to call DC back. Office hours and phone number provided.

## 2019-08-03 NOTE — Telephone Encounter (Signed)
LMOVM for patient ot call back DC. Direct number and hours provided.

## 2019-08-05 NOTE — Telephone Encounter (Signed)
Contacted patient at home number due to no response after messages x3 left at cell number. Individual who answered phone disconnected call after this RN asked for pt. Home number is the same for pt and both ECs.  Encounter closed due to no response. Patient is overdue for f/u with Dr. Belva Chimes APP. Routed to scheduler for assistance.

## 2019-08-10 ENCOUNTER — Encounter: Payer: Self-pay | Admitting: Student

## 2019-08-10 ENCOUNTER — Other Ambulatory Visit: Payer: Self-pay

## 2019-08-10 ENCOUNTER — Ambulatory Visit (INDEPENDENT_AMBULATORY_CARE_PROVIDER_SITE_OTHER): Payer: Medicare Other | Admitting: Student

## 2019-08-10 VITALS — BP 124/82 | HR 106 | Ht 63.0 in | Wt 246.0 lb

## 2019-08-10 DIAGNOSIS — I5022 Chronic systolic (congestive) heart failure: Secondary | ICD-10-CM | POA: Diagnosis not present

## 2019-08-10 DIAGNOSIS — I428 Other cardiomyopathies: Secondary | ICD-10-CM | POA: Diagnosis not present

## 2019-08-10 DIAGNOSIS — J849 Interstitial pulmonary disease, unspecified: Secondary | ICD-10-CM

## 2019-08-10 DIAGNOSIS — Z95810 Presence of automatic (implantable) cardiac defibrillator: Secondary | ICD-10-CM

## 2019-08-10 LAB — CUP PACEART INCLINIC DEVICE CHECK
Battery Remaining Longevity: 62 mo
Brady Statistic RV Percent Paced: 0 %
Date Time Interrogation Session: 20210428115640
HighPow Impedance: 76.5 Ohm
Implantable Lead Implant Date: 20161007
Implantable Lead Location: 753860
Implantable Lead Model: 181
Implantable Lead Serial Number: 331179
Implantable Pulse Generator Implant Date: 20161007
Lead Channel Impedance Value: 400 Ohm
Lead Channel Pacing Threshold Amplitude: 0.75 V
Lead Channel Pacing Threshold Amplitude: 0.75 V
Lead Channel Pacing Threshold Pulse Width: 0.5 ms
Lead Channel Pacing Threshold Pulse Width: 0.5 ms
Lead Channel Sensing Intrinsic Amplitude: 11.8 mV
Lead Channel Setting Pacing Amplitude: 2.5 V
Lead Channel Setting Pacing Pulse Width: 0.5 ms
Lead Channel Setting Sensing Sensitivity: 0.5 mV
Pulse Gen Serial Number: 7286706

## 2019-08-10 NOTE — Patient Instructions (Signed)
Medication Instructions:  none *If you need a refill on your cardiac medications before your next appointment, please call your pharmacy*   Lab Work: none If you have labs (blood work) drawn today and your tests are completely normal, you will receive your results only by: Marland Kitchen MyChart Message (if you have MyChart) OR . A paper copy in the mail If you have any lab test that is abnormal or we need to change your treatment, we will call you to review the results.   Testing/Procedures: none   Follow-Up: At [facility], you and your health needs are our priority.  As part of our continuing mission to provide you with exceptional heart care, we have created designated Provider Care Teams.  These Care Teams include your primary Cardiologist (physician) and Advanced Practice Providers (APPs -  Physician Assistants and Nurse Practitioners) who all work together to provide you with the care you need, when you need it.  We recommend signing up for the patient portal called "MyChart".  Sign up information is provided on this After Visit Summary.  MyChart is used to connect with patients for Virtual Visits (Telemedicine).  Patients are able to view lab/test results, encounter notes, upcoming appointments, etc.  Non-urgent messages can be sent to your provider as well.   To learn more about what you can do with MyChart, go to [email or web address].    Your next appointment:   1 year(s)  The format for your next appointment:   Either In Person or Virtual  Provider:   Dr Caryl Comes   Other Instructions Remote monitoring is used to monitor your ICD from home. This monitoring reduces the number of office visits required to check your device to one time per year. It allows [place] to keep an eye on the functioning of your device to ensure it is working properly. You are scheduled for a device check from home on 10/19/19. You may send your transmission at any time that day. If you have a wireless device, the  transmission will be sent automatically. After your physician reviews your transmission, you will receive a postcard with your next transmission date.

## 2019-08-10 NOTE — Progress Notes (Signed)
Electrophysiology Office Note Date: 08/10/2019  ID:  Kevin Cardenas, DOB Sep 02, 1975, MRN [id number]  PCP: Kevin Lima, MD Primary Cardiologist: Kevin Champagne, MD Electrophysiologist: Kevin Axe, MD   CC: Routine ICD follow-up  Kevin Cardenas is a 44 y.o. male with history of VSD repair and removal of a pulmonary artery band at age 46, DM, HTN, chronic CHF (systolic), NICM, OSA (not on CPAP), ICD. seen today for Kevin Axe, MD for routine electrophysiology followup.  Since last being seen in our clinic the patient reports doing very well overall.  he denies chest pain, palpitations, dyspnea, PND, orthopnea, nausea, vomiting, dizziness, syncope, edema, weight gain, or early satiety. He has not had ICD shocks. He uses his CPAP   Device History: St. Jude Single Chamber ICD implanted 01/2015 for CHF History of appropriate therapy: No History of AAD therapy: No   Past Medical History:  Diagnosis Date  . CHF (congestive heart failure) (HCC)    Systolic, EF 43% per echo 11/12  . Diabetes mellitus   . Hypertension   . Non-ischemic cardiomyopathy ([place])   . Obstructive sleep apnea   . VSD (ventricular septal defect)    Past Surgical History:  Procedure Laterality Date  . EP IMPLANTABLE DEVICE N/A 01/19/2015   Procedure: ICD Implant;  Surgeon: Deboraha Sprang, MD;  Location: [facility];  Service: Cardiovascular;  Laterality: N/A;  . RIGHT HEART CATHETERIZATION N/A 08/03/2014   Procedure: RIGHT HEART CATH;  Surgeon: Larey Dresser, MD;  Location: [facility] CATH LAB;  Service: Cardiovascular;  Laterality: N/A;  . VSD REPAIR      Current Outpatient Medications  Medication Sig Dispense Refill  . albuterol (PROVENTIL) (2.5 MG/3ML) 0.083% nebulizer solution Take 3 mLs (2.5 mg total) by nebulization every 6 (six) hours as needed for wheezing or shortness of breath. 75 mL 11  . aspirin 81 MG chewable tablet Chew 1 tablet (81 mg total) by mouth daily. 30 tablet 0  . carvedilol (COREG)  25 MG tablet TAKE 1 TABLET BY MOUTH 2 TIMES DAILY WITH A MEAL. Please make yearly appt with Dr. Caryl Comes for December for future refills. 1st attempt 90 tablet 1  . digoxin (LANOXIN) 0.125 MG tablet TAKE 1 TABLET (125 MCG TOTAL) BY MOUTH DAILY. 90 tablet 2  . ENTRESTO 97-103 MG TAKE 1 TABLET BY MOUTH 2 TIMES DAILY. 60 tablet 11  . fluticasone furoate-vilanterol (BREO ELLIPTA) 200-25 MCG/INH AEPB Inhale 1 puff into the lungs daily. 30 each 11  . furosemide (LASIX) 40 MG tablet Take 1.5 tablets (60 mg total) by mouth every morning AND 1 tablet (40 mg total) every evening. 225 tablet 3  . isosorbide-hydrALAZINE (BIDIL) 20-37.5 MG tablet Take 2 tablets by mouth 3 (three) times daily. 180 tablet 3  . spironolactone (ALDACTONE) 25 MG tablet TAKE 1 TABLET (25 MG TOTAL) BY MOUTH DAILY. 90 tablet 2   No current facility-administered medications for this visit.    Allergies:   Patient has no known allergies.   Social History: Social History   Socioeconomic History  . Marital status: Single    Spouse name: Recently Married   . Number of children: 0  . Years of education: Not on file  . Highest education level: Not on file  Occupational History  . 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   Tobacco Use  . Smoking status: Former Smoker    Packs/day: 0.30    Years:  15.00    Pack years: 4.50    Types: Cigarettes    Quit date: 04/08/2011    Years since quitting: 8.3  . Smokeless tobacco: Never Used  . Tobacco comment: Smokes depends on who is around him.    Substance and Sexual Activity  . Alcohol use: No    Alcohol/week: 0.0 standard drinks    Comment: Rarely   . Drug use: No  . Sexual activity: Never  Other Topics Concern  . Not on file  Social History Narrative   Caffienated drinks-no   Seat belt use often-no   Regular Exercise-   Smoke alarm in the home-yes   Firearms/guns in the home-no   History of physical abuse-no                 Social Determinants of Health   Financial Resource Strain:   . Difficulty of Paying Living Expenses:   Food Insecurity:   . Worried About Charity fundraiser in the Last Year:   . Arboriculturist in the Last Year:   Transportation Needs:   . Film/video editor (Medical):   Marland Kitchen Lack of Transportation (Non-Medical):   Physical Activity:   . Days of Exercise per Week:   . Minutes of Exercise per Session:   Stress:   . Feeling of Stress :   Social Connections:   . Frequency of Communication with Friends and Family:   . Frequency of Social Gatherings with Friends and Family:   . Attends Religious Services:   . Active Member of Clubs or Organizations:   . Attends Archivist Meetings:   Marland Kitchen Marital Status:   Intimate Partner Violence:   . Fear of Current or Ex-Partner:   . Emotionally Abused:   Marland Kitchen Physically Abused:   . Sexually Abused:     Family History: Family History  Problem Relation Age of Onset  . Lung cancer Father   . Stroke Father   . Stroke Mother   . Diabetes Maternal Grandfather   . Diabetes Paternal Grandmother   . Cancer Neg Hx   . Alcohol abuse Neg Hx   . Drug abuse Neg Hx   . Early death Neg Hx   . Heart disease Neg Hx   . Hyperlipidemia Neg Hx   . Hypertension Neg Hx   . Kidney disease Neg Hx     Review of Systems: All other systems reviewed and are otherwise negative except as noted above.   Physical Exam: Vitals:   08/10/19 1126  BP: 124/82  Pulse: (!) 106  SpO2: 94%  Weight: 246 lb (111.6 kg)  Height: _0  (1.6 m)     GEN- The patient is well appearing, alert and oriented x 3 today.   HEENT: normocephalic, atraumatic; sclera clear, conjunctiva pink; hearing intact; oropharynx clear; neck supple, no JVP Lymph- no cervical lymphadenopathy Lungs- Clear to ausculation bilaterally, normal work of breathing.  No wheezes, rales, rhonchi Heart- Regular rate and rhythm, no murmurs, rubs or gallops, PMI not laterally displaced GI-  soft, non-tender, non-distended, bowel sounds present, no hepatosplenomegaly Extremities- no clubbing, cyanosis, or edema; DP/PT/radial pulses 2+ bilaterally MS- no significant deformity or atrophy Skin- warm and dry, no rash or lesion; ICD pocket well healed Psych- euthymic mood, full affect Neuro- strength and sensation are intact  ICD interrogation- reviewed in detail today,  See PACEART report  EKG:  EKG is ordered today. The ekg ordered today shows sinus tachycardia at 106 bpm  Recent Labs: 12/27/2018: BUN 10; Creatinine, Ser 0.82; Potassium 3.8; Sodium 140   Wt Readings from Last 3 Encounters:  08/10/19 246 lb (111.6 kg)  12/17/18 233 lb 9.6 oz (106 kg)  06/15/18 226 lb 12.8 oz (102.9 kg)     Other studies Reviewed: Additional studies/ records that were reviewed today include: Previous EP notes, previous remotes, Previous HF office notes.   Assessment and Plan:  1.  Chronic systolic dysfunction s/p St. Jude single chamber ICD  Echo 05/2018 EF 25%, mild RV dysfunction euvolemic today Stable on an appropriate medical regimen per CHF team Normal ICD function See Pace Art report No changes today  2. H/o VSD No residual shunt 2/20 echo or by most recent [place]  3. OSA Encouraged nightly CPAP  4. Pulmonary CPX test was concerning for interstitial disease.  PFTs pending.   Current medicines are reviewed at length with the patient today.   The patient does not have concerns regarding his medicines.  The following changes were made today:  none  Labs/ tests ordered today include:  Orders Placed This Encounter  Procedures  . EKG 12-Lead   Disposition:   Follow up with Dr. Caryl Comes in 12 months.   Jacalyn Lefevre, PA-C  08/10/2019 11:55 AM  [facility] [address] [place] [address] [phone] (office) [phone] (fax)

## 2019-09-26 ENCOUNTER — Other Ambulatory Visit: Payer: Self-pay | Admitting: Internal Medicine

## 2019-09-26 ENCOUNTER — Other Ambulatory Visit (HOSPITAL_COMMUNITY): Payer: Self-pay | Admitting: Cardiology

## 2019-09-26 ENCOUNTER — Other Ambulatory Visit (HOSPITAL_COMMUNITY): Payer: Self-pay | Admitting: Adult Health

## 2019-09-26 DIAGNOSIS — E785 Hyperlipidemia, unspecified: Secondary | ICD-10-CM

## 2019-09-26 DIAGNOSIS — I5022 Chronic systolic (congestive) heart failure: Secondary | ICD-10-CM

## 2019-09-26 DIAGNOSIS — E118 Type 2 diabetes mellitus with unspecified complications: Secondary | ICD-10-CM

## 2019-09-26 MED FILL — BIDIL 20-37.5 MG TABS: 20-37.5 | 30 days supply | Qty: 180 | Fill #1

## 2019-09-26 MED FILL — FUROSEMIDE 40 MG TAB: 40 | 90 days supply | Qty: 225 | Fill #1

## 2019-09-27 ENCOUNTER — Other Ambulatory Visit: Payer: Self-pay

## 2019-09-27 ENCOUNTER — Other Ambulatory Visit (HOSPITAL_COMMUNITY): Payer: Self-pay | Admitting: Cardiology

## 2019-09-27 ENCOUNTER — Telehealth: Payer: Self-pay | Admitting: Internal Medicine

## 2019-09-27 DIAGNOSIS — I5022 Chronic systolic (congestive) heart failure: Secondary | ICD-10-CM

## 2019-09-27 MED ORDER — DIGOXIN 125 MCG PO TABS: ORAL_TABLET | ORAL | 2 refills | Status: DC

## 2019-09-27 MED ORDER — FUROSEMIDE 40 MG PO TABS: ORAL_TABLET | ORAL | 2 refills | Status: DC

## 2019-09-27 MED ORDER — CARVEDILOL 25 MG PO TABS: ORAL_TABLET | ORAL | 2 refills | Status: DC

## 2019-09-27 MED ORDER — BIDIL 20-37.5 MG PO TABS: 2.0000 | ORAL_TABLET | Freq: Three times a day (TID) | ORAL | 2 refills | Status: DC

## 2019-09-27 MED FILL — ENTRESTO 97 MG-103 MG TAB: 97-103 | 30 days supply | Qty: 60 | Fill #0

## 2019-09-27 MED FILL — SIMVASTATIN 20 MG TABLET: 20 | 30 days supply | Qty: 30 | Fill #0

## 2019-09-27 MED FILL — SPIRONOLACTONE 25 MG TABS: 25 | 90 days supply | Qty: 90 | Fill #0

## 2019-09-27 MED FILL — DIGOXIN 0.125 MG TABLET: 125 | 90 days supply | Qty: 90 | Fill #0

## 2019-09-27 MED FILL — CARVEDILOL 25 MG TABLET: 25 | 90 days supply | Qty: 180 | Fill #0

## 2019-09-27 NOTE — Telephone Encounter (Signed)
*  STAT* If patient is at the pharmacy, call can be transferred to refill team.   1. Which medications need to be refilled? (please list name of each medication and dose if known)   albuterol (PROVENTIL) (2.5 MG/3ML) 0.083% nebulizer solution    carvedilol (COREG) 25 MG tablet    digoxin (LANOXIN) 0.125 MG tablet    digoxin (LANOXIN) 0.125 MG tablet    furosemide (LASIX) 40 MG tablet    isosorbide-hydrALAZINE (BIDIL) 20-37.5 MG tablet    spironolactone (ALDACTONE) 25 MG tablet    2. Which pharmacy/location (including street and city if local pharmacy) is medication to be sent to? [place], [place].  3. Do they need a 30 day or 90 day supply? 90 days

## 2019-09-28 NOTE — Telephone Encounter (Signed)
Kevin Cardenas is calling back due to his prescriptions still not being sent to the pharmacy. Please advise.

## 2019-09-28 NOTE — Telephone Encounter (Signed)
Called pt and left message informing pt that his medications were sent to his pharmacy as requested and if he has any other problems, questions or concerns, to please give our office a call back.

## 2019-10-19 ENCOUNTER — Ambulatory Visit (INDEPENDENT_AMBULATORY_CARE_PROVIDER_SITE_OTHER): Payer: Medicare Other | Admitting: *Deleted

## 2019-10-19 DIAGNOSIS — I5022 Chronic systolic (congestive) heart failure: Secondary | ICD-10-CM

## 2019-10-19 DIAGNOSIS — I428 Other cardiomyopathies: Secondary | ICD-10-CM | POA: Diagnosis not present

## 2019-10-20 ENCOUNTER — Other Ambulatory Visit: Payer: Self-pay

## 2019-10-20 ENCOUNTER — Ambulatory Visit: Payer: Medicare Other | Admitting: Physician Assistant

## 2019-10-20 VITALS — BP 124/84 | HR 95 | Temp 98.5°F | Resp 18 | Ht 69.0 in | Wt 248.0 lb

## 2019-10-20 DIAGNOSIS — R062 Wheezing: Secondary | ICD-10-CM | POA: Diagnosis not present

## 2019-10-20 DIAGNOSIS — I5022 Chronic systolic (congestive) heart failure: Secondary | ICD-10-CM | POA: Diagnosis not present

## 2019-10-20 DIAGNOSIS — R600 Localized edema: Secondary | ICD-10-CM

## 2019-10-20 DIAGNOSIS — Z6836 Body mass index (BMI) 36.0-36.9, adult: Secondary | ICD-10-CM

## 2019-10-20 LAB — CUP PACEART REMOTE DEVICE CHECK
Battery Remaining Longevity: 59 mo
Battery Remaining Percentage: 57 %
Battery Voltage: 2.96 V
Brady Statistic RV Percent Paced: 1 %
Date Time Interrogation Session: 20210707205717
HighPow Impedance: 78 Ohm
HighPow Impedance: 78 Ohm
Implantable Lead Implant Date: 20161007
Implantable Lead Location: 753860
Implantable Lead Model: 181
Implantable Lead Serial Number: 331179
Implantable Pulse Generator Implant Date: 20161007
Lead Channel Impedance Value: 390 Ohm
Lead Channel Pacing Threshold Amplitude: 0.75 V
Lead Channel Pacing Threshold Pulse Width: 0.5 ms
Lead Channel Sensing Intrinsic Amplitude: 12 mV
Lead Channel Setting Pacing Amplitude: 2.5 V
Lead Channel Setting Pacing Pulse Width: 0.5 ms
Lead Channel Setting Sensing Sensitivity: 0.5 mV
Pulse Gen Serial Number: 7286706

## 2019-10-20 NOTE — Patient Instructions (Signed)
Increase your breathing treatments to 2 times to 3 times a day.  Keep feet elevated when possible, use compression stockings, restrict sodium.  Please return to mobile unit on Monday, July 12 for follow-up  Kennieth Rad, PA-C Physician Assistant [place] [email or web address]    Edema  Edema is an abnormal buildup of fluids in the body tissues and under the skin. Swelling of the legs, feet, and ankles is a common symptom that becomes more likely as you get older. Swelling is also common in looser tissues, like around the eyes. When the affected area is squeezed, the fluid may move out of that spot and leave a dent for a few moments. This dent is called pitting edema. There are many possible causes of edema. Eating too much salt (sodium) and being on your feet or sitting for a long time can cause edema in your legs, feet, and ankles. Hot weather may make edema worse. Common causes of edema include:  Heart failure.  Liver or kidney disease.  Weak leg blood vessels.  Cancer.  An injury.  Pregnancy.  Medicines.  Being obese.  Low protein levels in the blood. Edema is usually painless. Your skin may look swollen or shiny. Follow these instructions at home:  Keep the affected body part raised (elevated) above the level of your heart when you are sitting or lying down.  Do not sit still or stand for long periods of time.  Do not wear tight clothing. Do not wear garters on your upper legs.  Exercise your legs to get your circulation going. This helps to move the fluid back into your blood vessels, and it may help the swelling go down.  Wear elastic bandages or support stockings to reduce swelling as told by your health care provider.  Eat a low-salt (low-sodium) diet to reduce fluid as told by your health care provider.  Depending on the cause of your swelling, you may need to limit how much fluid you drink (fluid  restriction).  Take over-the-counter and prescription medicines only as told by your health care provider. Contact a health care provider if:  Your edema does not get better with treatment.  You have heart, liver, or kidney disease and have symptoms of edema.  You have sudden and unexplained weight gain. Get help right away if:  You develop shortness of breath or chest pain.  You cannot breathe when you lie down.  You develop pain, redness, or warmth in the swollen areas.  You have heart, liver, or kidney disease and suddenly get edema.  You have a fever and your symptoms suddenly get worse. Summary  Edema is an abnormal buildup of fluids in the body tissues and under the skin.  Eating too much salt (sodium) and being on your feet or sitting for a long time can cause edema in your legs, feet, and ankles.  Keep the affected body part raised (elevated) above the level of your heart when you are sitting or lying down. This information is not intended to replace advice given to you by your health care provider. Make sure you discuss any questions you have with your health care provider. Document Revised: 08/18/2018 Document Reviewed: 05/03/2016 Elsevier Patient Education  [place].

## 2019-10-20 NOTE — Progress Notes (Signed)
New Patient Office Visit  Subjective:  Patient ID: Kevin Cardenas, male    DOB: 02-17-1976  Age: 44 y.o. MRN: [id number]  CC:  Chief Complaint  Patient presents with  . Leg Swelling    HPI DARVIS CROFT reports that he has been having bilateral lower leg edema for the last week.  Denies any changes in diet, no new medications.  Reports he is compliant to low-sodium diet.  Denies change in weight, denies shortness of breath during normal activities.  Reports he has been compliant to CPAP.  Does endorse edema is improved when feet are elevated.  Wife is present.  Past Medical History:  Diagnosis Date  . CHF (congestive heart failure) (HCC)    Systolic, EF 53% per echo 11/12  . Diabetes mellitus   . Hypertension   . Non-ischemic cardiomyopathy ([place])   . Obstructive sleep apnea   . VSD (ventricular septal defect)     Past Surgical History:  Procedure Laterality Date  . EP IMPLANTABLE DEVICE N/A 01/19/2015   Procedure: ICD Implant;  Surgeon: Deboraha Sprang, MD;  Location: [facility];  Service: Cardiovascular;  Laterality: N/A;  . RIGHT HEART CATHETERIZATION N/A 08/03/2014   Procedure: RIGHT HEART CATH;  Surgeon: Larey Dresser, MD;  Location: [facility] CATH LAB;  Service: Cardiovascular;  Laterality: N/A;  . VSD REPAIR      Family History  Problem Relation Age of Onset  . Lung cancer Father   . Stroke Father   . Stroke Mother   . Diabetes Maternal Grandfather   . Diabetes Paternal Grandmother   . Cancer Neg Hx   . Alcohol abuse Neg Hx   . Drug abuse Neg Hx   . Early death Neg Hx   . Heart disease Neg Hx   . Hyperlipidemia Neg Hx   . Hypertension Neg Hx   . Kidney disease Neg Hx     Social History   Socioeconomic History  . Marital status: Single    Spouse name: Recently Married   . Number of children: 0  . Years of education: Not on file  . Highest education level: Not on file  Occupational History  . Occupation: Librarian, academic     Comment: Works in  [employer]. and help unload trucks.   Tobacco Use  . Smoking status: Former Smoker    Packs/day: 0.30    Years: 15.00    Pack years: 4.50    Types: Cigarettes    Quit date: 04/08/2011    Years since quitting: 8.5  . Smokeless tobacco: Never Used  . Tobacco comment: Smokes depends on who is around him.    Substance and Sexual Activity  . Alcohol use: No    Alcohol/week: 0.0 standard drinks    Comment: Rarely   . Drug use: No  . Sexual activity: Not Currently  Other Topics Concern  . Not on file  Social History Narrative   Caffienated drinks-no   Seat belt use often-no   Regular Exercise-   Smoke alarm in the home-yes   Firearms/guns in the home-no   History of physical abuse-no               Social Determinants of Health   Financial Resource Strain:   . Difficulty of Paying Living Expenses:   Food Insecurity:   . Worried About Charity fundraiser in the Last Year:   . Arboriculturist in the Last Year:   [employer]  Needs:   . Lack of Transportation (Medical):   Marland Kitchen Lack of Transportation (Non-Medical):   Physical Activity:   . Days of Exercise per Week:   . Minutes of Exercise per Session:   Stress:   . Feeling of Stress :   Social Connections:   . Frequency of Communication with Friends and Family:   . Frequency of Social Gatherings with Friends and Family:   . Attends Religious Services:   . Active Member of Clubs or Organizations:   . Attends Archivist Meetings:   Marland Kitchen Marital Status:   Intimate Partner Violence:   . Fear of Current or Ex-Partner:   . Emotionally Abused:   Marland Kitchen Physically Abused:   . Sexually Abused:     ROS Review of Systems  Constitutional: Negative.   HENT: Negative.   Eyes: Negative.   Respiratory: Positive for wheezing. Negative for cough, chest tightness and shortness of breath.   Cardiovascular: Positive for leg swelling. Negative for chest pain and palpitations.  Gastrointestinal: Negative.   Endocrine: Negative.    Genitourinary: Negative.   Musculoskeletal: Negative.   Skin: Negative for color change and rash.  Allergic/Immunologic: Negative.   Neurological: Negative.   Hematological: Negative.   Psychiatric/Behavioral: Negative.     Objective:   Today's Vitals: BP 124/84 (BP Location: Left Arm, Patient Position: Sitting, Cuff Size: Large)   Pulse 95   Temp 98.5 F (36.9 C) (Oral)   Resp 18   Ht _0  (1.753 m)   Wt 248 lb (112.5 kg)   SpO2 90%   BMI 36.62 kg/m   Physical Exam Vitals and nursing note reviewed.  Constitutional:      General: He is not in acute distress.    Appearance: Normal appearance. He is obese. He is not ill-appearing.  HENT:     Head: Normocephalic and atraumatic.     Right Ear: External ear normal.     Left Ear: External ear normal.     Nose: Nose normal.     Mouth/Throat:     Mouth: Mucous membranes are moist.     Pharynx: Oropharynx is clear.  Eyes:     Extraocular Movements: Extraocular movements intact.     Conjunctiva/sclera: Conjunctivae normal.     Pupils: Pupils are equal, round, and reactive to light.  Cardiovascular:     Rate and Rhythm: Normal rate and regular rhythm.     Pulses: Normal pulses.          Carotid pulses are 2+ on the right side and 2+ on the left side.      Radial pulses are 2+ on the right side and 2+ on the left side.       Femoral pulses are 2+ on the right side and 2+ on the left side.      Popliteal pulses are 2+ on the right side and 2+ on the left side.       Dorsalis pedis pulses are 2+ on the right side and 2+ on the left side.       Posterior tibial pulses are 2+ on the right side and 2+ on the left side.     Heart sounds: Normal heart sounds.  Pulmonary:     Effort: Pulmonary effort is normal.     Breath sounds: Examination of the left-upper field reveals wheezing. Wheezing present. No decreased breath sounds.  Abdominal:     General: Abdomen is flat. Bowel sounds are normal.     Palpations: Abdomen  is soft.   Musculoskeletal:        General: Normal range of motion.     Cervical back: Normal range of motion and neck supple.     Right lower leg: 2+ Pitting Edema present.     Left lower leg: 2+ Pitting Edema present.  Skin:    General: Skin is warm and dry.  Neurological:     General: No focal deficit present.     Mental Status: He is alert and oriented to person, place, and time.  Psychiatric:        Mood and Affect: Mood normal.        Behavior: Behavior normal.        Thought Content: Thought content normal.        Judgment: Judgment normal.     Assessment & Plan:   Problem List Items Addressed This Visit      Cardiovascular and Mediastinum   Systolic CHF, chronic ([place])   Relevant Orders   Comp. Metabolic Panel (12)   Brain natriuretic peptide    Other Visit Diagnoses    Localized edema    -  Primary   Relevant Orders   Comp. Metabolic Panel (12)   Brain natriuretic peptide   TSH   Wheezing       Class 2 severe obesity due to excess calories with serious comorbidity and body mass index (BMI) of 36.0 to 36.9 in adult [facility])          Outpatient Encounter Medications as of 10/20/2019  Medication Sig  . albuterol (PROVENTIL) (2.5 MG/3ML) 0.083% nebulizer solution Take 3 mLs (2.5 mg total) by nebulization every 6 (six) hours as needed for wheezing or shortness of breath.  Marland Kitchen aspirin 81 MG chewable tablet Chew 1 tablet (81 mg total) by mouth daily.  . carvedilol (COREG) 25 MG tablet TAKE 1 TABLET BY MOUTH 2 TIMES DAILY WITH A MEAL.  Marland Kitchen digoxin (LANOXIN) 0.125 MG tablet TAKE 1 TABLET (125 MCG TOTAL) BY MOUTH DAILY.  . fluticasone furoate-vilanterol (BREO ELLIPTA) 200-25 MCG/INH AEPB Inhale 1 puff into the lungs daily.  . furosemide (LASIX) 40 MG tablet Take 1.5 tablets (60 mg total) by mouth every morning AND 1 tablet (40 mg total) every evening.  . isosorbide-hydrALAZINE (BIDIL) 20-37.5 MG tablet Take 2 tablets by mouth 3 (three) times daily.  . sacubitril-valsartan (ENTRESTO)  97-103 MG Take 1 tablet by mouth 2 (two) times daily. Please call for office visit [phone]  . simvastatin (ZOCOR) 20 MG tablet Take 1 tablet (20 mg total) by mouth at bedtime. Please call for office visit [phone]  . spironolactone (ALDACTONE) 25 MG tablet TAKE 1 TABLET (25 MG TOTAL) BY MOUTH DAILY.   No facility-administered encounter medications on file as of 10/20/2019.   1. Localized edema Encouraged patient to keep legs elevated, use compression stockings, restrict sodium, daily weight.  Red flags given for prompt evaluation at emergency department, follow-up in mobile unit in 4 days - Comp. Metabolic Panel (12) - Brain natriuretic peptide - TSH  2. Wheezing Unable to provide patient with albuterol inhaler due to insufficient supply.  Patient is using nebulizer treatment once a day, encouraged patient to increase breathing treatments to every 8 hours as needed  3. Systolic CHF, chronic ([place])  - Comp. Metabolic Panel (12) - Brain natriuretic peptide  4. Class 2 severe obesity due to excess calories with serious comorbidity and body mass index (BMI) of 36.0 to 36.9 in adult [facility])   I have reviewed  the patient's medical history (PMH, PSH, Social History, Family History, Medications, and allergies) , and have been updated if relevant. I spent 30 minutes reviewing chart and  face to face time with patient.    Follow-up: Return in about 4 days (around 10/24/2019).   Loraine Grip Mayers, PA-C

## 2019-10-21 LAB — COMP. METABOLIC PANEL (12)
AST: 24 IU/L (ref 0–40)
Albumin/Globulin Ratio: 1.6 (ref 1.2–2.2)
Albumin: 4 g/dL (ref 4.0–5.0)
Alkaline Phosphatase: 72 IU/L (ref 48–121)
BUN/Creatinine Ratio: 17 (ref 9–20)
BUN: 17 mg/dL (ref 6–24)
Bilirubin Total: 0.7 mg/dL (ref 0.0–1.2)
Calcium: 8.7 mg/dL (ref 8.7–10.2)
Chloride: 98 mmol/L (ref 96–106)
Creatinine, Ser: 1 mg/dL (ref 0.76–1.27)
GFR calc Af Amer: 105 mL/min/{1.73_m2} (ref >59)
GFR calc non Af Amer: 91 mL/min/{1.73_m2} (ref >59)
Globulin, Total: 2.5 g/dL (ref 1.5–4.5)
Glucose: 108 mg/dL — ABNORMAL HIGH (ref 65–99)
Potassium: 3.8 mmol/L (ref 3.5–5.2)
Sodium: 142 mmol/L (ref 134–144)
Total Protein: 6.5 g/dL (ref 6.0–8.5)

## 2019-10-21 LAB — TSH: TSH: 2.03 u[IU]/mL (ref 0.450–4.500)

## 2019-10-21 LAB — BRAIN NATRIURETIC PEPTIDE: BNP: 80.5 pg/mL (ref 0.0–100.0)

## 2019-10-21 NOTE — Progress Notes (Signed)
Remote ICD transmission.

## 2019-10-24 ENCOUNTER — Telehealth: Payer: Self-pay | Admitting: *Deleted

## 2019-10-24 NOTE — Telephone Encounter (Signed)
Medical Assistant left message on patient's home and cell voicemail. Voicemail states to give a call back to [place] with [facility] at [phone].

## 2019-11-01 ENCOUNTER — Other Ambulatory Visit (HOSPITAL_COMMUNITY): Payer: Self-pay | Admitting: Cardiology

## 2019-11-01 ENCOUNTER — Ambulatory Visit (HOSPITAL_COMMUNITY)
Admission: RE | Admit: 2019-11-01 | Discharge: 2019-11-01 | Disposition: A | Discharge status: Home / Self Care | Payer: Medicare Other | Source: Ambulatory Visit | Attending: Cardiology | Admitting: Cardiology

## 2019-11-01 ENCOUNTER — Encounter (HOSPITAL_COMMUNITY): Payer: Self-pay | Admitting: Cardiology

## 2019-11-01 ENCOUNTER — Other Ambulatory Visit: Payer: Self-pay

## 2019-11-01 VITALS — BP 128/76 | HR 85 | Wt 255.0 lb

## 2019-11-01 DIAGNOSIS — I110 Hypertensive heart disease with heart failure: Secondary | ICD-10-CM | POA: Diagnosis not present

## 2019-11-01 DIAGNOSIS — I5022 Chronic systolic (congestive) heart failure: Secondary | ICD-10-CM | POA: Diagnosis not present

## 2019-11-01 DIAGNOSIS — Z7951 Long term (current) use of inhaled steroids: Secondary | ICD-10-CM | POA: Insufficient documentation

## 2019-11-01 DIAGNOSIS — G4733 Obstructive sleep apnea (adult) (pediatric): Secondary | ICD-10-CM | POA: Diagnosis not present

## 2019-11-01 DIAGNOSIS — J984 Other disorders of lung: Secondary | ICD-10-CM | POA: Insufficient documentation

## 2019-11-01 DIAGNOSIS — I428 Other cardiomyopathies: Secondary | ICD-10-CM | POA: Insufficient documentation

## 2019-11-01 DIAGNOSIS — Z79899 Other long term (current) drug therapy: Secondary | ICD-10-CM | POA: Diagnosis not present

## 2019-11-01 DIAGNOSIS — Z8774 Personal history of (corrected) congenital malformations of heart and circulatory system: Secondary | ICD-10-CM | POA: Diagnosis not present

## 2019-11-01 DIAGNOSIS — Z87891 Personal history of nicotine dependence: Secondary | ICD-10-CM | POA: Insufficient documentation

## 2019-11-01 DIAGNOSIS — Z9989 Dependence on other enabling machines and devices: Secondary | ICD-10-CM | POA: Insufficient documentation

## 2019-11-01 DIAGNOSIS — Z95810 Presence of automatic (implantable) cardiac defibrillator: Secondary | ICD-10-CM | POA: Diagnosis not present

## 2019-11-01 DIAGNOSIS — I447 Left bundle-branch block, unspecified: Secondary | ICD-10-CM | POA: Insufficient documentation

## 2019-11-01 DIAGNOSIS — E119 Type 2 diabetes mellitus without complications: Secondary | ICD-10-CM | POA: Insufficient documentation

## 2019-11-01 DIAGNOSIS — Z823 Family history of stroke: Secondary | ICD-10-CM | POA: Diagnosis not present

## 2019-11-01 DIAGNOSIS — Z833 Family history of diabetes mellitus: Secondary | ICD-10-CM | POA: Diagnosis not present

## 2019-11-01 DIAGNOSIS — Z7982 Long term (current) use of aspirin: Secondary | ICD-10-CM | POA: Diagnosis not present

## 2019-11-01 LAB — BASIC METABOLIC PANEL
Anion gap: 9 (ref 5–15)
BUN: 12 mg/dL (ref 6–20)
CO2: 30 mmol/L (ref 22–32)
Calcium: 8.5 mg/dL — ABNORMAL LOW (ref 8.9–10.3)
Chloride: 100 mmol/L (ref 98–111)
Creatinine, Ser: 1.01 mg/dL (ref 0.61–1.24)
GFR calc Af Amer: >60 mL/min (ref >60)
GFR calc non Af Amer: >60 mL/min (ref >60)
Glucose, Bld: 146 mg/dL — ABNORMAL HIGH (ref 70–99)
Potassium: 3.8 mmol/L (ref 3.5–5.1)
Sodium: 139 mmol/L (ref 135–145)

## 2019-11-01 LAB — DIGOXIN LEVEL: Digoxin Level: <0.2 ng/mL — ABNORMAL LOW (ref 0.8–2.0)

## 2019-11-01 MED ORDER — POTASSIUM CHLORIDE CRYS ER 20 MEQ PO TBCR
20.0000 meq | EXTENDED_RELEASE_TABLET | Freq: Every day | ORAL | 3 refills | Status: DC
Start: 2019-11-01 — End: 2020-06-05

## 2019-11-01 MED ORDER — METOLAZONE 2.5 MG PO TABS
2.5000 mg | ORAL_TABLET | ORAL | 0 refills | Status: DC
Start: 2019-11-01 — End: 2019-11-07

## 2019-11-01 MED ORDER — TORSEMIDE 20 MG PO TABS
40.0000 mg | ORAL_TABLET | Freq: Two times a day (BID) | ORAL | 3 refills | Status: DC
Start: 2019-11-01 — End: 2019-11-07

## 2019-11-01 MED FILL — metOLazone 2.5 MG TABS: 2.5 | 3 days supply | Qty: 3 | Fill #0

## 2019-11-01 MED FILL — POTASSIUM CHLORIDE CRYS ER: 20 | 30 days supply | Qty: 30 | Fill #0

## 2019-11-01 MED FILL — TORSEMIDE 20 MG TABLET: 20 | 30 days supply | Qty: 120 | Fill #0

## 2019-11-01 NOTE — Patient Instructions (Addendum)
Stop Furosemide  Start Torsemide this afternoon  Take 60 mg (3 tabs) Twice daily FOR 3 DAYS ONLY, then   Take 40 mg (2 tabs) Twice daily   Take Metolazone 2.5 mg TOMORROW AM WHEN YOU TAKE TORSEMIDE  Start Potassium 20 meq Daily  Labs done today, we will contact you for abnormal results  Labs needed in 1 week  Your physician has requested that you have an echocardiogram. Echocardiography is a painless test that uses sound waves to create images of your heart. It provides your doctor with information about the size and shape of your heart and how well your heart's chambers and valves are working. This procedure takes approximately one hour. There are no restrictions for this procedure.  Your physician recommends that you schedule a follow-up appointment in: 2-3 weeks  If you have any questions or concerns before your next appointment please send [place] a message through [place] or call our office at [phone].    TO LEAVE A MESSAGE FOR THE NURSE SELECT OPTION 2, PLEASE LEAVE A MESSAGE INCLUDING: . YOUR NAME . DATE OF BIRTH . CALL BACK NUMBER . REASON FOR CALL**this is important as we prioritize the call backs  [place] AS LONG AS YOU CALL BEFORE 4:00 PM  At the [facility], you and your health needs are our priority. As part of our continuing mission to provide you with exceptional heart care, we have created designated Provider Care Teams. These Care Teams include your primary Cardiologist (physician) and Advanced Practice Providers (APPs- Physician Assistants and Nurse Practitioners) who all work together to provide you with the care you need, when you need it.   You may see any of the following providers on your designated Care Team at your next follow up: Marland Kitchen Dr Glori Bickers . Dr [name] . Darrick Grinder, NP . Lyda Jester, PA . Audry Riles, PharmD   Please be sure to bring in all your medications bottles to every  appointment.

## 2019-11-01 NOTE — Progress Notes (Signed)
ID:  Kevin Cardenas, DOB 1975/11/13, MRN [id number]   Provider location: [facility] Type of Visit: Established patient  PCP:  [name], MD  Cardiologist:  [name]   History of Present Illness: Kevin Cardenas is a 44 y.o. male who has a history of VSD repair and removal of a pulmonary artery band at age 32, DM, HTN, systolic heart failure, NICM and OSA diagnosed in past but not on CPAP. Has St Jude ICD (placed 01/2015).   Echo in 3/20 showed EF 25%, mild LV dilation, mildly decreased RV systolic function.   CPX in 10/20 showed moderate functional limitation primarily due to restrictive lung disease.  I had planned to get PFTs and a high resolution CT chest to followup on lung restriction but was never scheduled.   Patient returns for followup of CHF.  Weight is up 22 lbs since he was last seen in the office. He reports increased dyspnea and lower extremity edema for about 2 wks.  He is now short of breath walking 40-50 lbs. He has a chronic cough. No chest pain.  +Orthopnea and bendopnea.  He was out of his medications for 2-3 weeks but this was around a month ago.   ECG (personally reviewed): NSR, LBBB (130 msec)  Labs (1/16): K 4.1, creatinine 0.9, TSH normal, LDL 89, HDL 46 Labs (05/24/2014): K 4.1 Creatinine 0.97  Labs (3/16): BNP 54, K 4.4, creatinine 1.0 Labs 08/14/2014: K 4.0 Creatinine 0.93  Labs (6/16): digoxin < 0.2 Labs (9/16): K 3.7, creatinine 0.93, digoxin < 0.5, HCT 40.4 Labs (01/2016): dig 0.5 K 4.1 Creatinine 0.94  Labs (09/2017): K 4.3 Creatinine 0.95 Labs (3/20): K 4.3, creatinine 1.02, digoxin < 0.2, LDL 79 Labs (9/20): K 3.8, creatinine 0.82, digoxin < 0.2 Labs (7/21): K 3.8, creatinine 1.0, BNP 81, TSH normal  PMH: 1. VSD repair at 1.  2. Type 2 diabetes. 3. HTN 4. OSA: Not using CPAP.  5. Chronic systolic CHF: Nonischemic cardiomyopathy.  St Jude ICD.  - Echo 2009: EF 35-40% - LHC 2009: No angiographic coronary  disease.  - Echo 9/11: LVEF 20-25%  - Echo 11/12: LVEF 30-35%, diffuse hypokinesis and mild LVH. RV systolic mildly reduced. LA mildly dilated. Pulmonic valve with mild stenosis.  - Echo 05/12/2014: EF 15-20% Grade IDD, LV moderately dilated.  RV normal. Mild pulmonc stenosis mean peak gradient 14.  - Echo 7/16: EF 25-30%, mild LV dilation, mild LVH, trivial pulmonic stenosis with peak gradient 11 mmHg, mildly decreased RV function with mild RV dilation.   - CPX (3/16): RER 1.03, peak VO2 19.9, VE/VCO2 slope 18, mild to moderately impaired functional capacity.  There was moderate to severe mixed restrictive/obstructive pattern on spirometry => exertional hypoxemia, oxygen saturation decreased to 84%.  - RHC (4/16): mean RA 9, PA 35/12, mean PCWP 8, CI 1.8 Fick, CI 2.4 thermo.  - Echo 02/2106: EF 30-35% Grade IIDD  - Echo 3/20: EF 25%, mild LV dilation, diffuse hypokinesis, mildly decreased RV systolic function with mild RV dilation, no definite PS noted.  - CPX (10/20): VE/VCO2 16, peak VO2 15.8 (57% predicted), RER 1.08.  Moderate functional limitation primarily due to restrictive lung disease.  Severe restriction on PFTs.  6. Pulmonic stenosis: Mild in past, not significant on 2/20 echo.   Current Outpatient Medications  Medication Sig Dispense Refill   albuterol (PROVENTIL) (2.5 MG/3ML) 0.083% nebulizer solution Take 3 mLs (2.5 mg total) by nebulization every  6 (six) hours as needed for wheezing or shortness of breath. 75 mL 11   carvedilol (COREG) 25 MG tablet TAKE 1 TABLET BY MOUTH 2 TIMES DAILY WITH A MEAL. 180 tablet 2   digoxin (LANOXIN) 0.125 MG tablet TAKE 1 TABLET (125 MCG TOTAL) BY MOUTH DAILY. 90 tablet 2   fluticasone furoate-vilanterol (BREO ELLIPTA) 200-25 MCG/INH AEPB Inhale 1 puff into the lungs daily. 30 each 11   isosorbide-hydrALAZINE (BIDIL) 20-37.5 MG tablet Take 2 tablets by mouth 3 (three) times daily. 540 tablet 2   sacubitril-valsartan (ENTRESTO) 97-103 MG Take 1  tablet by mouth 2 (two) times daily. Please call for office visit [phone] 60 tablet 4   simvastatin (ZOCOR) 20 MG tablet Take 1 tablet (20 mg total) by mouth at bedtime. Please call for office visit [phone] 30 tablet 4   spironolactone (ALDACTONE) 25 MG tablet TAKE 1 TABLET (25 MG TOTAL) BY MOUTH DAILY. 90 tablet 2   aspirin 81 MG chewable tablet Chew 1 tablet (81 mg total) by mouth daily. (Patient not taking: Reported on 11/01/2019) 30 tablet 0   metolazone (ZAROXOLYN) 2.5 MG tablet Take 1 tablet (2.5 mg total) by mouth as directed. 3 tablet 0   potassium chloride SA (KLOR-CON) 20 MEQ tablet Take 1 tablet (20 mEq total) by mouth daily. 30 tablet 3   torsemide (DEMADEX) 20 MG tablet Take 2 tablets (40 mg total) by mouth 2 (two) times daily. 120 tablet 3   No current facility-administered medications for this encounter.    Allergies:   Patient has no known allergies.   Social History:  The patient  reports that he quit smoking about 8 years ago. His smoking use included cigarettes. He has a 4.50 pack-year smoking history. He has never used smokeless tobacco. He reports that he does not drink alcohol and does not use drugs.   Family History:  The patient's family history includes Diabetes in his maternal grandfather and paternal grandmother; Lung cancer in his father; Stroke in his father and mother.   ROS:  Please see the history of present illness.   All other systems are personally reviewed and negative.   Exam:   BP 128/76    Pulse 85    Wt 115.7 kg (255 lb)    SpO2 90%    BMI 37.66 kg/m  General: NAD Neck: JVP 12 cm,  no thyromegaly or thyroid nodule.  Lungs: Decreased at bases.  CV: Nondisplaced PMI.  Heart regular S1/S2, no S3/S4, 1/6 SEM RUSB.  2+ edema to knees.  No carotid bruit.  Normal pedal pulses.  Abdomen: Soft, nontender, no hepatosplenomegaly, no distention.  Skin: Intact without lesions or rashes.  Neurologic: Alert and oriented x 3.  Psych: Normal  affect. Extremities: No clubbing or cyanosis.  HEENT: Normal.   Recent Labs: 10/20/2019: BNP 80.5; TSH 2.030 11/01/2019: BUN 12; Creatinine, Ser 1.01; Potassium 3.8; Sodium 139  Personally reviewed   Wt Readings from Last 3 Encounters:  11/01/19 115.7 kg (255 lb)  10/20/19 112.5 kg (248 lb)  08/10/19 111.6 kg (246 lb)    ASSESSMENT AND PLAN:  1. Chronic systolic CHF: Nonischemic cardiomyopathy.  RHC in 4/16 showed preserved cardiac index by thermodilution but low by Fick.   Last Echo in 2/20 showed mildly dilated LV, EF 25%, mildly decreased RV systolic function, no definite pulmonic stenosis.  He has a Research officer, political party ICD (QRS not wide enough to be likely to benefit from CRT - 130 msec today). NYHA class IIIb symptoms,  worsening over the last couple of weeks.  Weight up.  On exam, he is volume overloaded.  Exacerbation seems to be developed after he was out of meds x 2-3 weeks.  He is now back on all his meds.  - Continue Coreg 25 mg bid and Entresto 97/103 bid.   - Continue spironolactone 25 mg daily.  - Continue digoxin 0.125 and check level.   - Continue Bidil 2 tabs tid.  - Stop Lasix, start torsemide 60 mg bid x 3 days then 40 mg bid. He will take 1 dose of metolazone 2.5 mg x 1 tomorrow with am torsemide.  BMET today and in 10 days.  - Add KCL 20 mEq daily.  - Would like to start [place] at followup.  - I will arrange for echo.  2. H/o VSD: No residual shunt noted 2/20 echo.  Last RHC also did not suggest any significant left to right shunting.  3. Pulmonary stenosis: Very mild by 7/16 echo, no significant stenosis 2/20 echo.  Mild PS murmur on exam.  4. OSA: Using CPAP.  5. Pulmonary: CPX in 10/20 concerning for interstitial lung disease. We ordered a workup with formal PFTs and high resolution CT chest, but this was never done.  - I will arrange for full PFTs and a high resolution CT chest w/o contrast after he is fully diuresed.    Followup with NP/PA in 2 wks.   Signed, [name], MD  11/01/2019  [facility] [address] [facility] [place] [address] [phone] (office) [phone] (fax)

## 2019-11-04 ENCOUNTER — Observation Stay (HOSPITAL_COMMUNITY): Payer: Medicare Other

## 2019-11-04 ENCOUNTER — Other Ambulatory Visit: Payer: Self-pay

## 2019-11-04 ENCOUNTER — Inpatient Hospital Stay (HOSPITAL_COMMUNITY)
Admission: EM | Admit: 2019-11-04 | Discharge: 2019-11-07 | DRG: 291 | Disposition: A | Discharge status: Home / Self Care | Payer: Medicare Other | Attending: Internal Medicine | Admitting: Internal Medicine

## 2019-11-04 ENCOUNTER — Encounter (HOSPITAL_COMMUNITY): Payer: Self-pay

## 2019-11-04 DIAGNOSIS — E785 Hyperlipidemia, unspecified: Secondary | ICD-10-CM | POA: Diagnosis present

## 2019-11-04 DIAGNOSIS — Z823 Family history of stroke: Secondary | ICD-10-CM | POA: Diagnosis not present

## 2019-11-04 DIAGNOSIS — I951 Orthostatic hypotension: Secondary | ICD-10-CM

## 2019-11-04 DIAGNOSIS — I5020 Unspecified systolic (congestive) heart failure: Secondary | ICD-10-CM | POA: Diagnosis present

## 2019-11-04 DIAGNOSIS — E869 Volume depletion, unspecified: Secondary | ICD-10-CM | POA: Diagnosis present

## 2019-11-04 DIAGNOSIS — E662 Morbid (severe) obesity with alveolar hypoventilation: Secondary | ICD-10-CM | POA: Diagnosis present

## 2019-11-04 DIAGNOSIS — T501X5A Adverse effect of loop [high-ceiling] diuretics, initial encounter: Secondary | ICD-10-CM | POA: Diagnosis not present

## 2019-11-04 DIAGNOSIS — I83891 Varicose veins of right lower extremities with other complications: Secondary | ICD-10-CM | POA: Diagnosis present

## 2019-11-04 DIAGNOSIS — Z9989 Dependence on other enabling machines and devices: Secondary | ICD-10-CM | POA: Diagnosis not present

## 2019-11-04 DIAGNOSIS — Z7982 Long term (current) use of aspirin: Secondary | ICD-10-CM | POA: Diagnosis not present

## 2019-11-04 DIAGNOSIS — Z888 Allergy status to other drugs, medicaments and biological substances status: Secondary | ICD-10-CM | POA: Diagnosis not present

## 2019-11-04 DIAGNOSIS — Z9119 Patient's noncompliance with other medical treatment and regimen: Secondary | ICD-10-CM | POA: Diagnosis not present

## 2019-11-04 DIAGNOSIS — Z20822 Contact with and (suspected) exposure to covid-19: Secondary | ICD-10-CM | POA: Diagnosis not present

## 2019-11-04 DIAGNOSIS — E876 Hypokalemia: Secondary | ICD-10-CM | POA: Diagnosis not present

## 2019-11-04 DIAGNOSIS — Z8774 Personal history of (corrected) congenital malformations of heart and circulatory system: Secondary | ICD-10-CM | POA: Diagnosis not present

## 2019-11-04 DIAGNOSIS — R0902 Hypoxemia: Secondary | ICD-10-CM | POA: Diagnosis not present

## 2019-11-04 DIAGNOSIS — I110 Hypertensive heart disease with heart failure: Secondary | ICD-10-CM | POA: Diagnosis not present

## 2019-11-04 DIAGNOSIS — I10 Essential (primary) hypertension: Secondary | ICD-10-CM | POA: Diagnosis not present

## 2019-11-04 DIAGNOSIS — Z801 Family history of malignant neoplasm of trachea, bronchus and lung: Secondary | ICD-10-CM

## 2019-11-04 DIAGNOSIS — Z833 Family history of diabetes mellitus: Secondary | ICD-10-CM

## 2019-11-04 DIAGNOSIS — Z87891 Personal history of nicotine dependence: Secondary | ICD-10-CM

## 2019-11-04 DIAGNOSIS — Q256 Stenosis of pulmonary artery: Secondary | ICD-10-CM

## 2019-11-04 DIAGNOSIS — I428 Other cardiomyopathies: Secondary | ICD-10-CM | POA: Diagnosis not present

## 2019-11-04 DIAGNOSIS — G4733 Obstructive sleep apnea (adult) (pediatric): Secondary | ICD-10-CM

## 2019-11-04 DIAGNOSIS — J984 Other disorders of lung: Secondary | ICD-10-CM | POA: Diagnosis present

## 2019-11-04 DIAGNOSIS — E119 Type 2 diabetes mellitus without complications: Secondary | ICD-10-CM | POA: Diagnosis not present

## 2019-11-04 DIAGNOSIS — Z79899 Other long term (current) drug therapy: Secondary | ICD-10-CM | POA: Diagnosis not present

## 2019-11-04 DIAGNOSIS — I420 Dilated cardiomyopathy: Secondary | ICD-10-CM | POA: Diagnosis present

## 2019-11-04 DIAGNOSIS — Q210 Ventricular septal defect: Secondary | ICD-10-CM

## 2019-11-04 DIAGNOSIS — S81811A Laceration without foreign body, right lower leg, initial encounter: Secondary | ICD-10-CM | POA: Diagnosis not present

## 2019-11-04 DIAGNOSIS — Z713 Dietary counseling and surveillance: Secondary | ICD-10-CM

## 2019-11-04 DIAGNOSIS — Z6837 Body mass index (BMI) 37.0-37.9, adult: Secondary | ICD-10-CM

## 2019-11-04 DIAGNOSIS — Z95810 Presence of automatic (implantable) cardiac defibrillator: Secondary | ICD-10-CM

## 2019-11-04 DIAGNOSIS — N179 Acute kidney failure, unspecified: Secondary | ICD-10-CM | POA: Diagnosis not present

## 2019-11-04 DIAGNOSIS — R58 Hemorrhage, not elsewhere classified: Secondary | ICD-10-CM

## 2019-11-04 DIAGNOSIS — I5022 Chronic systolic (congestive) heart failure: Secondary | ICD-10-CM | POA: Diagnosis not present

## 2019-11-04 HISTORY — DX: Orthostatic hypotension: I95.1

## 2019-11-04 HISTORY — DX: Chronic systolic (congestive) heart failure: I50.22

## 2019-11-04 LAB — CBC
HCT: 46.7 % (ref 39.0–52.0)
Hemoglobin: 15 g/dL (ref 13.0–17.0)
MCH: 31.4 pg (ref 26.0–34.0)
MCHC: 32.1 g/dL (ref 30.0–36.0)
MCV: 97.7 fL (ref 80.0–100.0)
Platelets: 131 10*3/uL — ABNORMAL LOW (ref 150–400)
RBC: 4.78 MIL/uL (ref 4.22–5.81)
RDW: 13.8 % (ref 11.5–15.5)
WBC: 4.2 10*3/uL (ref 4.0–10.5)
nRBC: 0 % (ref 0.0–0.2)

## 2019-11-04 LAB — BASIC METABOLIC PANEL
Anion gap: 15 (ref 5–15)
BUN: 37 mg/dL — ABNORMAL HIGH (ref 6–20)
CO2: 35 mmol/L — ABNORMAL HIGH (ref 22–32)
Calcium: 8.6 mg/dL — ABNORMAL LOW (ref 8.9–10.3)
Chloride: 87 mmol/L — ABNORMAL LOW (ref 98–111)
Creatinine, Ser: 2.34 mg/dL — ABNORMAL HIGH (ref 0.61–1.24)
GFR calc Af Amer: 38 mL/min — ABNORMAL LOW (ref >60)
GFR calc non Af Amer: 33 mL/min — ABNORMAL LOW (ref >60)
Glucose, Bld: 100 mg/dL — ABNORMAL HIGH (ref 70–99)
Potassium: 3.6 mmol/L (ref 3.5–5.1)
Sodium: 137 mmol/L (ref 135–145)

## 2019-11-04 LAB — TYPE AND SCREEN
ABO/RH(D): A POS
Antibody Screen: NEGATIVE

## 2019-11-04 LAB — ABO/RH: ABO/RH(D): A POS

## 2019-11-04 LAB — SARS CORONAVIRUS 2 BY RT PCR (HOSPITAL ORDER, PERFORMED IN ~~LOC~~ HOSPITAL LAB): SARS Coronavirus 2: NEGATIVE

## 2019-11-04 MED ORDER — FLUTICASONE FUROATE-VILANTEROL 200-25 MCG/INH IN AEPB
1.0000 | INHALATION_SPRAY | Freq: Every day | RESPIRATORY_TRACT | Status: DC
  Administered 2019-11-05 – 2019-11-07 (×3): 1 via RESPIRATORY_TRACT
  Filled 2019-11-04: qty 28

## 2019-11-04 MED ORDER — SODIUM CHLORIDE 0.9 % IV SOLN: INTRAVENOUS | Status: AC

## 2019-11-04 MED ORDER — DIGOXIN 125 MCG PO TABS
0.1250 mg | ORAL_TABLET | Freq: Every day | ORAL | Status: DC
  Administered 2019-11-05 – 2019-11-07 (×3): 0.125 mg via ORAL
  Filled 2019-11-04 (×3): qty 1

## 2019-11-04 MED ORDER — ALBUTEROL SULFATE (2.5 MG/3ML) 0.083% IN NEBU: 2.5000 mg | INHALATION_SOLUTION | Freq: Four times a day (QID) | RESPIRATORY_TRACT | Status: DC | PRN

## 2019-11-04 MED ORDER — SODIUM CHLORIDE 0.9 % IV BOLUS
1000.0000 mL | Freq: Once | INTRAVENOUS | Status: AC
  Administered 2019-11-04: 1000 mL via INTRAVENOUS

## 2019-11-04 NOTE — ED Provider Notes (Signed)
Shared service with APP.  I have personally seen and examined the patient, providing direct face to face care.  Physical exam findings and plan include patient presents with varicose vein bleeding right lower extremity, unable to stop the bleeding.  Patient is on aspirin and feels lightheaded.  Patient blood pressure hypotensive on arrival.  Physician assistant was able to stop the bleeding with figure-of-eight suture and pressure.  Discussed observation, blood work and reassessment.  No active bleeding on reassessment.  IV US guided by myself for hypotension and failed attempts.  IV fluids started.  No diagnosis found.   .Critical Care Performed by: Elnora Morrison, MD Authorized by: Elnora Morrison, MD   Critical care provider statement:    Critical care time (minutes):  35   Critical care start time:  11/04/2019 6:40 PM   Critical care end time:  11/04/2019 7:15 PM   Critical care time was exclusive of:  Separately billable procedures and treating other patients and teaching time   Critical care was necessary to treat or prevent imminent or life-threatening deterioration of the following conditions:  Circulatory failure   Critical care was time spent personally by me on the following activities:  Evaluation of patient's response to treatment, examination of patient, ordering and performing treatments and interventions, ordering and review of laboratory studies, pulse oximetry and review of old charts Ultrasound ED Peripheral IV (Provider)  Date/Time: 11/04/2019 7:37 PM Performed by: Elnora Morrison, MD Authorized by: Elnora Morrison, MD   Procedure details:    Indications: hypotension and multiple failed IV attempts     Skin Prep: chlorhexidine gluconate     Location:  Right AC   Angiocath:  18 G   Bedside Ultrasound Guided: Yes     Images: archived        Elnora Morrison, MD 11/05/19 0017

## 2019-11-04 NOTE — ED Notes (Signed)
Pt noted destated for previous RN. Was placed on 2L nasal cannula. Pt. Now weaned off of oxygen. 98% on room air.

## 2019-11-04 NOTE — ED Triage Notes (Addendum)
Pt presents to ED due to a "busted varicose vein" to his RLE, pt states EMS came to his house and wrapped his leg and instructed him to get the area checked out. Bleeding controlled, bandage in place

## 2019-11-04 NOTE — ED Provider Notes (Signed)
[facility] EMERGENCY DEPARTMENT Provider Note   CSN: [id number] Arrival date & time: 11/04/19  1423     History Chief Complaint  Patient presents with  . Varicose Veins    Kevin Cardenas is a 44 y.o. male medical history of diabetes, CHF, nonischemic cardiomyopathy who presents the emergency department with complaint of bleeding varicosity.  The patient states that he has a prominent varicose vein on the right lower extremity.  He noticed that there was a small scab on the vein.  Today the scab opened and blood was spurting out of his leg.  He states that he lost a lot of blood.  He does not feel dizzy.  EMS applied a pressure dressing.  The patient is not on any blood thinners.  He does not have a history of hypertension but arrives with soft pressures.  HPI     Past Medical History:  Diagnosis Date  . CHF (congestive heart failure) (HCC)    Systolic, EF 76% per echo 11/12  . Diabetes mellitus   . Hypertension   . Non-ischemic cardiomyopathy ([place])   . Obstructive sleep apnea   . VSD (ventricular septal defect)     Patient Active Problem List   Diagnosis Date Noted  . Orthostatic hypotension 11/04/2019  . Class 2 obesity due to excess calories with serious comorbidity in adult 01/16/2016  . Asthma, stable, mild intermittent 01/15/2016  . OSA on CPAP 06/21/2014  . Routine general medical examination at a health care facility 04/18/2014  . Hyperlipidemia with target LDL less than 100 04/17/2014  . Non-ischemic cardiomyopathy ([place]) 02/06/2011  . Systolic CHF, chronic ([place]) 02/06/2011  . VSD (ventricular septal defect) 02/06/2011  . HTN (hypertension) 02/06/2011  . Prediabetes 01/30/2011    Past Surgical History:  Procedure Laterality Date  . EP IMPLANTABLE DEVICE N/A 01/19/2015   Procedure: ICD Implant;  Surgeon: Deboraha Sprang, MD;  Location: [facility];  Service: Cardiovascular;  Laterality: N/A;  . RIGHT HEART CATHETERIZATION N/A 08/03/2014     Procedure: RIGHT HEART CATH;  Surgeon: Larey Dresser, MD;  Location: [facility] CATH LAB;  Service: Cardiovascular;  Laterality: N/A;  . VSD REPAIR         Family History  Problem Relation Age of Onset  . Lung cancer Father   . Stroke Father   . Stroke Mother   . Diabetes Maternal Grandfather   . Diabetes Paternal Grandmother   . Cancer Neg Hx   . Alcohol abuse Neg Hx   . Drug abuse Neg Hx   . Early death Neg Hx   . Heart disease Neg Hx   . Hyperlipidemia Neg Hx   . Hypertension Neg Hx   . Kidney disease Neg Hx     Social History   Tobacco Use  . Smoking status: Former Smoker    Packs/day: 0.30    Years: 15.00    Pack years: 4.50    Types: Cigarettes    Quit date: 04/08/2011    Years since quitting: 8.5  . Smokeless tobacco: Never Used  . Tobacco comment: Smokes depends on who is around him.    Substance Use Topics  . Alcohol use: No    Alcohol/week: 0.0 standard drinks    Comment: Rarely   . Drug use: No    Home Medications Prior to Admission medications   Medication Sig Start Date End Date Taking? Authorizing Provider  albuterol (PROVENTIL) (2.5 MG/3ML) 0.083% nebulizer solution Take 3 mLs (2.5  mg total) by nebulization every 6 (six) hours as needed for wheezing or shortness of breath. 01/15/16  Yes [name], MD  ascorbic acid (VITAMIN C) 500 MG tablet Take 500-1,000 mg by mouth daily.   Yes [provider]  aspirin 81 MG chewable tablet Chew 1 tablet (81 mg total) by mouth daily. 04/11/12  Yes Ripley Fraise, MD  carvedilol (COREG) 25 MG tablet TAKE 1 TABLET BY MOUTH 2 TIMES DAILY WITH A MEAL. Patient taking differently: Take 25 mg by mouth 2 (two) times daily with a meal.  09/27/19  Yes Tillery, Satira Mccallum, PA-C  digoxin (LANOXIN) 0.125 MG tablet TAKE 1 TABLET (125 MCG TOTAL) BY MOUTH DAILY. Patient taking differently: Take 0.125 mg by mouth daily.  09/27/19  Yes Shirley Friar, PA-C  fluticasone furoate-vilanterol (BREO ELLIPTA) 200-25  MCG/INH AEPB Inhale 1 puff into the lungs daily. Patient taking differently: Inhale 1 puff into the lungs daily as needed (for seasonal flares).  08/05/17  Yes Bensimhon, Shaune Pascal, MD  isosorbide-hydrALAZINE (BIDIL) 20-37.5 MG tablet Take 2 tablets by mouth 3 (three) times daily. 09/27/19  Yes Shirley Friar, PA-C  sacubitril-valsartan (ENTRESTO) 97-103 MG Take 1 tablet by mouth 2 (two) times daily. Please call for office visit [phone] 09/27/19  Yes Larey Dresser, MD  simvastatin (ZOCOR) 20 MG tablet Take 1 tablet (20 mg total) by mouth at bedtime. Please call for office visit [phone] Patient taking differently: Take 20 mg by mouth at bedtime.  09/27/19  Yes Larey Dresser, MD  spironolactone (ALDACTONE) 25 MG tablet TAKE 1 TABLET (25 MG TOTAL) BY MOUTH DAILY. Patient taking differently: Take 25 mg by mouth daily.  09/27/19  Yes Deboraha Sprang, MD  torsemide (DEMADEX) 20 MG tablet Take 2 tablets (40 mg total) by mouth 2 (two) times daily. 11/01/19  Yes Larey Dresser, MD  metolazone (ZAROXOLYN) 2.5 MG tablet Take 1 tablet (2.5 mg total) by mouth as directed. 11/01/19   Larey Dresser, MD  potassium chloride SA (KLOR-CON) 20 MEQ tablet Take 1 tablet (20 mEq total) by mouth daily. 11/01/19   Larey Dresser, MD    Allergies    Metolazone  Review of Systems   Review of Systems Ten systems reviewed and are negative for acute change, except as noted in the HPI.   Physical Exam Updated Vital Signs BP 106/70   Pulse 71   Temp 98.5 F (36.9 C) (Oral)   Resp 15   Ht _0  (1.753 m)   Wt (!) 115.7 kg   SpO2 93%   BMI 37.66 kg/m   Physical Exam Vitals and nursing note reviewed.  Constitutional:      General: He is not in acute distress.    Appearance: He is well-developed. He is not diaphoretic.  HENT:     Head: Normocephalic and atraumatic.  Eyes:     General: No scleral icterus.    Conjunctiva/sclera: Conjunctivae normal.  Cardiovascular:     Rate and Rhythm:  Normal rate and regular rhythm.     Heart sounds: Normal heart sounds.  Pulmonary:     Effort: Pulmonary effort is normal. No respiratory distress.     Breath sounds: Normal breath sounds.  Abdominal:     Palpations: Abdomen is soft.     Tenderness: There is no abdominal tenderness.  Musculoskeletal:     Cervical back: Normal range of motion and neck supple.     Comments: Varicose vein on the right lower  extremity with a central ulceration.  Brisk extravasation of blood is noted after removal of pressure dressing.  Skin:    General: Skin is warm and dry.  Neurological:     Mental Status: He is alert.  Psychiatric:        Behavior: Behavior normal.     ED Results / Procedures / Treatments   Labs (all labs ordered are listed, but only abnormal results are displayed) Labs Reviewed  CBC - Abnormal; Notable for the following components:      Result Value   Platelets 131 (*)    All other components within normal limits  BASIC METABOLIC PANEL - Abnormal; Notable for the following components:   Chloride 87 (*)    CO2 35 (*)    Glucose, Bld 100 (*)    BUN 37 (*)    Creatinine, Ser 2.34 (*)    Calcium 8.6 (*)    GFR calc non Af Amer 33 (*)    GFR calc Af Amer 38 (*)    All other components within normal limits  SARS CORONAVIRUS 2 BY RT PCR (HOSPITAL ORDER, Echo LAB)  HIV ANTIBODY (ROUTINE TESTING W REFLEX)  BASIC METABOLIC PANEL  CBC  DIGOXIN LEVEL  TYPE AND SCREEN  ABO/RH    EKG None  Radiology No results found.  Procedures .Critical Care Performed by: Margarita Mail, PA-C Authorized by: Margarita Mail, PA-C   Critical care provider statement:    Critical care time (minutes):  50   Critical care was necessary to treat or prevent imminent or life-threatening deterioration of the following conditions:  Circulatory failure and respiratory failure   Critical care was time spent personally by me on the following activities:  Development of  treatment plan with patient or surrogate, evaluation of patient's response to treatment, examination of patient, obtaining history from patient or surrogate, ordering and performing treatments and interventions, ordering and review of laboratory studies, re-evaluation of patient's condition and review of old charts .Marland KitchenLaceration Repair  Date/Time: 11/04/2019 5:29 PM Performed by: Margarita Mail, PA-C Authorized by: Margarita Mail, PA-C   Consent:    Consent obtained:  Verbal   Consent given by:  Patient   Risks discussed:  Infection Universal protocol:    Procedure explained and questions answered to patient or proxy's satisfaction: yes     Relevant documents present and verified: yes     Test results available and properly labeled: yes     Imaging studies available: yes     Required blood products, implants, devices, and special equipment available: yes     Site/side marked: yes     Immediately prior to procedure, a time out was called: yes     Patient identity confirmed:  Verbally with patient Anesthesia (see MAR for exact dosages):    Anesthesia method:  Local infiltration   Local anesthetic:  Bupivacaine 0.25% WITH epi Laceration details:    Location:  Leg   Leg location:  R lower leg   Length (cm):  0.3 Repair type:    Repair type:  Intermediate Exploration:    Hemostasis achieved with:  Tied off vessels   Wound extent: vascular damage   Treatment:    Area cleansed with:  Betadine   Amount of cleaning:  Standard Skin repair:    Repair method:  Sutures   Suture size:  3-0   Wound skin closure material used: vicryl.   Suture technique:  Figure eight   Number of sutures:  2   (  including critical care time)  Medications Ordered in ED Medications  digoxin (LANOXIN) tablet 0.125 mg (has no administration in time range)  albuterol (PROVENTIL) (2.5 MG/3ML) 0.083% nebulizer solution 2.5 mg (has no administration in time range)  fluticasone furoate-vilanterol (BREO ELLIPTA)  200-25 MCG/INH 1 puff (has no administration in time range)  0.9 %  sodium chloride infusion (has no administration in time range)  sodium chloride 0.9 % bolus 1,000 mL (0 mLs Intravenous Stopped 11/04/19 2122)    ED Course  I have reviewed the triage vital signs and the nursing notes.  Pertinent labs & imaging results that were available during my care of the patient were reviewed by me and considered in my medical decision making (see chart for details).    MDM Rules/Calculators/A&P                          PK:[id number] varicose vein VS:  Vitals:   11/04/19 1955 11/04/19 1959 11/04/19 2014 11/04/19 2100  BP: (!) 92/60 108/79 99/77 106/70  Pulse: 70 69 72 71  Resp: _0 Temp:      TempSrc:      SpO2: 100% 100% 99% 93%  Weight:      Height:        NZ:UDODQVH is gathered by patient and emr. Previous records obtained and reviewed.  Labs: I ordered reviewed and interpreted labs which include CBC which shows no acute abnormality.  Mild thrombocytopenia, hemoglobin is normal.  The BMP shows acute kidney injury with elevated BUN in the setting of increased Lasix dose and recent hemorrhage. Imaging: EKG: Consults: MDM: Patient here with bleeding varicosity.  He had significant bleeding at home.  Patient also had significant bleeding in the ER despite my best efforts at hemostasis I have concerned that his AKI may be multifactorial secondary to increased Lasix dose but potentially also from blood loss.  Initial hemoglobin is within normal limits however this likely needs to be repeated.  Patient also had positive orthostatics and his oxygen dropped as well and is now on 2 L.  I have ordered chest x-ray.  EKG pending.  Patient will be admitted to the hospital service Patient disposition: The patient appears reasonably stabilized for admission considering the current resources, flow, and capabilities available in the ED at this time, and I doubt any other [facility] requiring further  screening and/or treatment in the ED prior to admission.        Final Clinical Impression(s) / ED Diagnoses Final diagnoses:  AKI (acute kidney injury) ([place])  Hemorrhage  Bleeding from varicose veins of lower extremity, right  Hypoxia    Rx / DC Orders ED Discharge Orders    None       Margarita Mail, PA-C 11/04/19 2131    Elnora Morrison, MD 11/09/19 [phone]

## 2019-11-04 NOTE — ED Notes (Signed)
Patient's POC CBG 137

## 2019-11-05 DIAGNOSIS — N179 Acute kidney failure, unspecified: Secondary | ICD-10-CM | POA: Diagnosis present

## 2019-11-05 DIAGNOSIS — E876 Hypokalemia: Secondary | ICD-10-CM

## 2019-11-05 DIAGNOSIS — I10 Essential (primary) hypertension: Secondary | ICD-10-CM

## 2019-11-05 DIAGNOSIS — I951 Orthostatic hypotension: Secondary | ICD-10-CM

## 2019-11-05 DIAGNOSIS — I428 Other cardiomyopathies: Secondary | ICD-10-CM

## 2019-11-05 DIAGNOSIS — Z9989 Dependence on other enabling machines and devices: Secondary | ICD-10-CM

## 2019-11-05 DIAGNOSIS — G4733 Obstructive sleep apnea (adult) (pediatric): Secondary | ICD-10-CM

## 2019-11-05 LAB — BASIC METABOLIC PANEL
Anion gap: 11 (ref 5–15)
BUN: 30 mg/dL — ABNORMAL HIGH (ref 6–20)
CO2: 37 mmol/L — ABNORMAL HIGH (ref 22–32)
Calcium: 8.3 mg/dL — ABNORMAL LOW (ref 8.9–10.3)
Chloride: 90 mmol/L — ABNORMAL LOW (ref 98–111)
Creatinine, Ser: 1.44 mg/dL — ABNORMAL HIGH (ref 0.61–1.24)
GFR calc Af Amer: >60 mL/min (ref >60)
GFR calc non Af Amer: 59 mL/min — ABNORMAL LOW (ref >60)
Glucose, Bld: 100 mg/dL — ABNORMAL HIGH (ref 70–99)
Potassium: 3.1 mmol/L — ABNORMAL LOW (ref 3.5–5.1)
Sodium: 138 mmol/L (ref 135–145)

## 2019-11-05 LAB — CBC
HCT: 44.2 % (ref 39.0–52.0)
Hemoglobin: 14 g/dL (ref 13.0–17.0)
MCH: 31.1 pg (ref 26.0–34.0)
MCHC: 31.7 g/dL (ref 30.0–36.0)
MCV: 98.2 fL (ref 80.0–100.0)
Platelets: 129 10*3/uL — ABNORMAL LOW (ref 150–400)
RBC: 4.5 MIL/uL (ref 4.22–5.81)
RDW: 13.7 % (ref 11.5–15.5)
WBC: 3.5 10*3/uL — ABNORMAL LOW (ref 4.0–10.5)
nRBC: 0 % (ref 0.0–0.2)

## 2019-11-05 LAB — GLUCOSE, CAPILLARY
Glucose-Capillary: 112 mg/dL — ABNORMAL HIGH (ref 70–99)
Glucose-Capillary: 130 mg/dL — ABNORMAL HIGH (ref 70–99)
Glucose-Capillary: 106 mg/dL — ABNORMAL HIGH (ref 70–99)
Glucose-Capillary: 133 mg/dL — ABNORMAL HIGH (ref 70–99)

## 2019-11-05 LAB — DIGOXIN LEVEL: Digoxin Level: 0.4 ng/mL — ABNORMAL LOW (ref 0.8–2.0)

## 2019-11-05 LAB — HIV ANTIBODY (ROUTINE TESTING W REFLEX): HIV Screen 4th Generation wRfx: NONREACTIVE

## 2019-11-05 MED ORDER — POTASSIUM CHLORIDE CRYS ER 20 MEQ PO TBCR
40.0000 meq | EXTENDED_RELEASE_TABLET | Freq: Every day | ORAL | Status: DC
  Administered 2019-11-05 – 2019-11-07 (×3): 40 meq via ORAL
  Filled 2019-11-05 (×3): qty 2

## 2019-11-05 MED ORDER — SODIUM CHLORIDE 0.9 % IV SOLN
500.0000 mL | Freq: Once | INTRAVENOUS | Status: AC
  Administered 2019-11-05: 500 mL via INTRAVENOUS

## 2019-11-05 MED ORDER — ACETAMINOPHEN 325 MG PO TABS
650.0000 mg | ORAL_TABLET | Freq: Four times a day (QID) | ORAL | Status: DC | PRN
  Administered 2019-11-05: 650 mg via ORAL
  Filled 2019-11-05: qty 2

## 2019-11-05 MED ORDER — TRAMADOL HCL 50 MG PO TABS
50.0000 mg | ORAL_TABLET | Freq: Four times a day (QID) | ORAL | Status: DC | PRN
  Administered 2019-11-05: 50 mg via ORAL
  Filled 2019-11-05: qty 1

## 2019-11-05 NOTE — Plan of Care (Signed)
Verbal instruction given to pt about BP control and medications, with verbal understanding returned

## 2019-11-05 NOTE — Plan of Care (Signed)
  Problem: Clinical Measurements: Goal: Ability to maintain clinical measurements within normal limits will improve Outcome: Progressing Goal: Respiratory complications will improve Outcome: Progressing Goal: Cardiovascular complication will be avoided Outcome: Progressing   Problem: Pain Managment: Goal: General experience of comfort will improve Outcome: Progressing   Problem: Safety: Goal: Ability to remain free from injury will improve Outcome: Progressing

## 2019-11-05 NOTE — Care Management Obs Status (Signed)
[facility] NOTIFICATION   Patient Details  Name: Kevin Cardenas MRN: [id number] Date of Birth: 04-Nov-1975   Medicare Observation Status Notification Given:  Yes    Claudie Leach, RN 11/05/2019, 5:45 PM

## 2019-11-05 NOTE — H&P (Addendum)
Kevin Cardenas is an 44 y.o. male.   Chief Complaint: Bleeding from varicosity, hypotension HPI: Today the patient was sitting on his porch when a varicosity began bleeding heavily. EMS was called and the bleeding was stopped in the ED with a figure 8 stitch. Afterwards the patient was very dizzy. The patient had been having issues with dizziness upon standing. He states that he had noticed increased shortness of breath and swelling of his lower extremities. His diuretics were increased. His SOB and swelling improved. But he began to feel badly and to be short of breath.  Past medical history is significant for cardiomyopathy, CHF, obesity, DM II, hypertension, OSA, and ventricular septal defect.  In the ED the patient was found to have orthostatic hypotension. A figure 8 stitch was used to stop the bleeding in the ED. He did have an episode of hypoxia in the ED that appears to have quickly resolved.   Lab revealed elevation of the patient's creatinine to 2.34 over his baseline of 1.0. CXR revealed no acute cardiopulmonary disease.  [facility] hospitalists were consulted to admit the patient for further evaluation and care.  The patient denies fevers, chills, cough, wheezes, shortness of breath. He has had some nausea and vomiting. No hematochezia, melena, hematemesis, or coffee ground emesis.  Past Medical History:  Diagnosis Date  . CHF (congestive heart failure) (HCC)    Systolic, EF 16% per echo 11/12  . Diabetes mellitus   . Hypertension   . Non-ischemic cardiomyopathy ([place])   . Obstructive sleep apnea   . VSD (ventricular septal defect)     Past Surgical History:  Procedure Laterality Date  . EP IMPLANTABLE DEVICE N/A 01/19/2015   Procedure: ICD Implant;  Surgeon: Deboraha Sprang, MD;  Location: [facility];  Service: Cardiovascular;  Laterality: N/A;  . RIGHT HEART CATHETERIZATION N/A 08/03/2014   Procedure: RIGHT HEART CATH;  Surgeon: Larey Dresser, MD;  Location: [facility] CATH LAB;   Service: Cardiovascular;  Laterality: N/A;  . VSD REPAIR      Family History  Problem Relation Age of Onset  . Lung cancer Father   . Stroke Father   . Stroke Mother   . Diabetes Maternal Grandfather   . Diabetes Paternal Grandmother   . Cancer Neg Hx   . Alcohol abuse Neg Hx   . Drug abuse Neg Hx   . Early death Neg Hx   . Heart disease Neg Hx   . Hyperlipidemia Neg Hx   . Hypertension Neg Hx   . Kidney disease Neg Hx    Social History:  reports that he quit smoking about 8 years ago. His smoking use included cigarettes. He has a 4.50 pack-year smoking history. He has never used smokeless tobacco. He reports that he does not drink alcohol and does not use drugs. Medications Prior to Admission  Medication Sig Dispense Refill  . albuterol (PROVENTIL) (2.5 MG/3ML) 0.083% nebulizer solution Take 3 mLs (2.5 mg total) by nebulization every 6 (six) hours as needed for wheezing or shortness of breath. 75 mL 11  . ascorbic acid (VITAMIN C) 500 MG tablet Take 500-1,000 mg by mouth daily.    Marland Kitchen aspirin 81 MG chewable tablet Chew 1 tablet (81 mg total) by mouth daily. 30 tablet 0  . carvedilol (COREG) 25 MG tablet TAKE 1 TABLET BY MOUTH 2 TIMES DAILY WITH A MEAL. (Patient taking differently: Take 25 mg by mouth 2 (two) times daily with a meal. ) 180 tablet 2  .  digoxin (LANOXIN) 0.125 MG tablet TAKE 1 TABLET (125 MCG TOTAL) BY MOUTH DAILY. (Patient taking differently: Take 0.125 mg by mouth daily. ) 90 tablet 2  . fluticasone furoate-vilanterol (BREO ELLIPTA) 200-25 MCG/INH AEPB Inhale 1 puff into the lungs daily. (Patient taking differently: Inhale 1 puff into the lungs daily as needed (for seasonal flares). ) 30 each 11  . isosorbide-hydrALAZINE (BIDIL) 20-37.5 MG tablet Take 2 tablets by mouth 3 (three) times daily. 540 tablet 2  . sacubitril-valsartan (ENTRESTO) 97-103 MG Take 1 tablet by mouth 2 (two) times daily. Please call for office visit [phone] 60 tablet 4  . simvastatin (ZOCOR)  20 MG tablet Take 1 tablet (20 mg total) by mouth at bedtime. Please call for office visit [phone] (Patient taking differently: Take 20 mg by mouth at bedtime. ) 30 tablet 4  . spironolactone (ALDACTONE) 25 MG tablet TAKE 1 TABLET (25 MG TOTAL) BY MOUTH DAILY. (Patient taking differently: Take 25 mg by mouth daily. ) 90 tablet 2  . torsemide (DEMADEX) 20 MG tablet Take 2 tablets (40 mg total) by mouth 2 (two) times daily. 120 tablet 3  . metolazone (ZAROXOLYN) 2.5 MG tablet Take 1 tablet (2.5 mg total) by mouth as directed. 3 tablet 0  . potassium chloride SA (KLOR-CON) 20 MEQ tablet Take 1 tablet (20 mEq total) by mouth daily. 30 tablet 3    Allergies:  Allergies  Allergen Reactions  . Metolazone Other (See Comments)    "Dried up my kidneys- took only one tablet"    Pertinent items noted in HPI and remainder of comprehensive ROS otherwise negative.  General appearance: alert, cooperative and no distress Head: Normocephalic, without obvious abnormality, atraumatic Eyes: conjunctivae/corneas clear. PERRL, EOM's intact. Fundi benign. Throat: lips, mucosa, and tongue normal; teeth and gums normal Neck: no adenopathy, no carotid bruit, no JVD, supple, symmetrical, trachea midline and thyroid not enlarged, symmetric, no tenderness/mass/nodules Resp: No increased work of breathing. No wheezes, rales, or rhonchi. No tactile fremitus. Chest wall: no tenderness Cardio: regular rate and rhythm, S1, S2 normal, no murmur, click, rub or gallop GI: soft, non-tender; bowel sounds normal; no masses,  no organomegaly Extremities: extremities normal, atraumatic, no cyanosis or edema Pulses: 2+ and symmetric Skin: Skin color, texture, turgor normal. No rashes or lesions Lymph nodes: Cervical, supraclavicular, and axillary nodes normal. Neurologic: Alert and oriented X 3, normal strength and tone. Normal symmetric reflexes. Normal coordination and gait   Results for orders placed or performed during  the hospital encounter of 11/04/19 (from the past 48 hour(s))  CBC     Status: Abnormal   Collection Time: 11/04/19  5:35 PM  Result Value Ref Range   WBC 4.2 4.0 - 10.5 K/uL   RBC 4.78 4.22 - 5.81 MIL/uL   Hemoglobin 15.0 13.0 - 17.0 g/dL   HCT 46.7 39 - 52 %   MCV 97.7 80.0 - 100.0 fL   MCH 31.4 26.0 - 34.0 pg   MCHC 32.1 30.0 - 36.0 g/dL   RDW 13.8 11.5 - 15.5 %   Platelets 131 (L) 150 - 400 K/uL   nRBC 0.0 0.0 - 0.2 %    Comment: Performed at [facility], [place] [address], [place], [place] [address]  Basic metabolic panel     Status: Abnormal   Collection Time: 11/04/19  5:35 PM  Result Value Ref Range   Sodium 137 135 - 145 mmol/L   Potassium 3.6 3.5 - 5.1 mmol/L   Chloride 87 (L)  98 - 111 mmol/L   CO2 35 (H) 22 - 32 mmol/L   Glucose, Bld 100 (H) 70 - 99 mg/dL    Comment: Glucose reference range applies only to samples taken after fasting for at least 8 hours.   BUN 37 (H) 6 - 20 mg/dL   Creatinine, Ser 2.34 (H) 0.61 - 1.24 mg/dL   Calcium 8.6 (L) 8.9 - 10.3 mg/dL   GFR calc non Af Amer 33 (L) >60 mL/min   GFR calc Af Amer 38 (L) >60 mL/min   Anion gap 15 5 - 15    Comment: Performed at [facility] [address] [place], [place] [address]  Type and screen     Status: None   Collection Time: 11/04/19  5:36 PM  Result Value Ref Range   ABO/RH(D) A POS    Antibody Screen NEG    Sample Expiration      11/07/2019,2359 Performed at [facility], [place] [address] [place], [place] [address]   ABO/Rh     Status: None   Collection Time: 11/04/19  5:48 PM  Result Value Ref Range   ABO/RH(D)      A POS Performed at [facility] [address] [place], [place] [address]   SARS Coronavirus 2 by RT PCR (hospital order, performed in [facility] hospital lab) Nasopharyngeal Nasopharyngeal Swab     Status: None   Collection Time: 11/04/19  8:00 PM   Specimen: Nasopharyngeal Swab  Result Value Ref Range   SARS Coronavirus 2 NEGATIVE NEGATIVE     Comment: (NOTE) SARS-CoV-2 target nucleic acids are NOT DETECTED.  The SARS-CoV-2 RNA is generally detectable in upper and lower respiratory specimens during the acute phase of infection. The lowest concentration of SARS-CoV-2 viral copies this assay can detect is 250 copies / mL. A negative result does not preclude SARS-CoV-2 infection and should not be used as the sole basis for treatment or other patient management decisions.  A negative result may occur with improper specimen collection / handling, submission of specimen other than nasopharyngeal swab, presence of viral mutation(s) within the areas targeted by this assay, and inadequate number of viral copies (<250 copies / mL). A negative result must be combined with clinical observations, patient history, and epidemiological information.  Fact Sheet for Patients:   StrictlyIdeas.no  Fact Sheet for Healthcare Providers: BankingDealers.co.za  This test is not yet approved or  cleared by the [place] FDA and has been authorized for detection and/or diagnosis of SARS-CoV-2 by FDA under an Emergency Use Authorization (EUA).  This EUA will remain in effect (meaning this test can be used) for the duration of the COVID-19 declaration under Section 564(b)(1) of the Act, 21 U.S.C. section 360bbb-3(b)(1), unless the authorization is terminated or revoked sooner.  Performed at [facility], [place] [address] [place], [place] [address]    _0 @  Blood pressure 124/75, pulse 71, temperature 98.8 F (37.1 C), resp. rate 16, height 5' 9" (1.753 m), weight (!) 115.7 kg, SpO2 90 %.   Assessment/Plan Problem  Aki (Acute Kidney Injury) (Hcc)  Orthostatic Hypotension  Osa On Cpap  Hyperlipidemia With Target Ldl Less Than 100  Non-Ischemic Cardiomyopathy (Hcc)  Systolic Chf, Chronic (Hcc)  Vsd (Ventricular Septal Defect)  Htn (Hypertension)   Orthostatic hypotension:  Likely due to volume depletion as a result of acute bleeding from ruptured varicosity in the setting of recently increased diuretic dosing. Patient is receiving cautious IV fluids.  Antihypertensives and diuretics have been held. Orthostatic vitals will be obtained.  AKI: Baseline creatinine is 1.0. Creatinine is up to 2.34. Patient will be receiving cautious IV fluids.  Antihypertensives and diuretics have been held. Will monitor creatinine, electrolytes, and volume status.  Hyperlipidemia: Statin held due to elevated creatinine.  Non-ischemic CMO/CHF: Cautious IV fluids. Restarted diuretics and antihypertensives when creatinine and volume status allows. EF as of echo in 07/12/2018 is 25%. There was also diffuse ventricular hypokinesis. Will repeat echocardiogram to update evaluation. Digoxin has been increased creatinine. Dig level is being checked.  Hypertension: Antihypertensives have been held due to current orthostasis.  I have seen and examined this patient myself. I have spent 78 minutes in his evaluation and care.  DVT Prophylaxis: SCD's CODE STATUS: Full Code Family Communication: Mother at bedside Disposition: The patient is from home. He will be admitted to a progressive telemetry bed. Anticipate discharge to home.  Severity of Illness: The appropriate patient status for this patient is OBSERVATION. Observation status is judged to be reasonable and necessary in order to provide the required intensity of service to ensure the patient's safety. The patient's presenting symptoms, physical exam findings, and initial radiographic and laboratory data in the context of their medical condition is felt to place them at decreased risk for further clinical deterioration. Furthermore, it is anticipated that the patient will be medically stable for discharge from the hospital within 2 midnights of admission. The following factors support the patient status of observation.   " The patient's  presenting symptoms include Dizziness, bleeding. " The physical exam findings include Dizziness with sitting up. " The initial radiographic and laboratory data are Increased creatinine.     Kevin Cardenas 11/05/2019, 12:58 AM

## 2019-11-05 NOTE — Progress Notes (Signed)
44 yo M presenting with bleeding varicosity and hypotension. See H&P from this morning for full details. Scr coming down. Continue gentle hydration. I have spoken with cards. Recent change to diuretics (7/20) They will see him. Appreciate assistance. Otherwise, continue as per H&P. BP not yet stable for resuming diuretics.  Physical Exam: General: 44 y.o. male resting in bed in NAD Cardiovascular: RRR, +S1, S2, no m/g/r, equal pulses throughout Respiratory: CTABL, no w/r/r, normal WOB GI: BS+, NDNT, no masses noted, no organomegaly noted MSK: No e/c/c Neuro: A&O x 3, no focal deficits Psyc: Appropriate interaction and affect, calm/cooperative    Orthostatic hypotension     - Likely due to volume depletion as a result of recently increased diuretic dosing.      - Pt w/ severe HFrEF, so gentle fluids in small alloquots until BP/SCr stable.     - Antihypertensives and diuretics have been held.   AKI      - Baseline creatinine is 1.0. SCr is up to 2.34 at admission.     - see above.  Hyperlipidemia     - Statin held due to elevated SCr.     - SCR is improved, can resume statin  Non-ischemic CMO/CHF:      - Gentle IVF in small alloquots.      - Restart diuretics and antihypertensives when creatinine and volume status allows.      - EF as of echo in 07/12/2018 is 25%. There was also diffuse ventricular hypokinesis.      - Digoxin has been adjusted d/t elevated SCr     - Recent change in diuretics, I have asked cards to assess. Appreciate their assistance  Hypertension     - Antihypertensives have been held due to soft pressures  Hypokalemia     - add K+, check Mg2+, follow

## 2019-11-06 ENCOUNTER — Encounter (HOSPITAL_COMMUNITY): Payer: Self-pay | Admitting: Internal Medicine

## 2019-11-06 DIAGNOSIS — Q256 Stenosis of pulmonary artery: Secondary | ICD-10-CM | POA: Diagnosis not present

## 2019-11-06 DIAGNOSIS — Z801 Family history of malignant neoplasm of trachea, bronchus and lung: Secondary | ICD-10-CM | POA: Diagnosis not present

## 2019-11-06 DIAGNOSIS — E869 Volume depletion, unspecified: Secondary | ICD-10-CM | POA: Diagnosis present

## 2019-11-06 DIAGNOSIS — I5020 Unspecified systolic (congestive) heart failure: Secondary | ICD-10-CM | POA: Diagnosis not present

## 2019-11-06 DIAGNOSIS — Z9119 Patient's noncompliance with other medical treatment and regimen: Secondary | ICD-10-CM | POA: Diagnosis not present

## 2019-11-06 DIAGNOSIS — Z833 Family history of diabetes mellitus: Secondary | ICD-10-CM | POA: Diagnosis not present

## 2019-11-06 DIAGNOSIS — Z7982 Long term (current) use of aspirin: Secondary | ICD-10-CM | POA: Diagnosis not present

## 2019-11-06 DIAGNOSIS — Z20822 Contact with and (suspected) exposure to covid-19: Secondary | ICD-10-CM | POA: Diagnosis present

## 2019-11-06 DIAGNOSIS — Z823 Family history of stroke: Secondary | ICD-10-CM | POA: Diagnosis not present

## 2019-11-06 DIAGNOSIS — N179 Acute kidney failure, unspecified: Secondary | ICD-10-CM | POA: Diagnosis not present

## 2019-11-06 DIAGNOSIS — T501X5A Adverse effect of loop [high-ceiling] diuretics, initial encounter: Secondary | ICD-10-CM | POA: Diagnosis present

## 2019-11-06 DIAGNOSIS — E119 Type 2 diabetes mellitus without complications: Secondary | ICD-10-CM | POA: Diagnosis present

## 2019-11-06 DIAGNOSIS — Z79899 Other long term (current) drug therapy: Secondary | ICD-10-CM | POA: Diagnosis not present

## 2019-11-06 DIAGNOSIS — I83891 Varicose veins of right lower extremities with other complications: Secondary | ICD-10-CM | POA: Diagnosis present

## 2019-11-06 DIAGNOSIS — E662 Morbid (severe) obesity with alveolar hypoventilation: Secondary | ICD-10-CM | POA: Diagnosis present

## 2019-11-06 DIAGNOSIS — I110 Hypertensive heart disease with heart failure: Secondary | ICD-10-CM | POA: Diagnosis present

## 2019-11-06 DIAGNOSIS — I5022 Chronic systolic (congestive) heart failure: Secondary | ICD-10-CM | POA: Diagnosis not present

## 2019-11-06 DIAGNOSIS — E876 Hypokalemia: Secondary | ICD-10-CM | POA: Diagnosis not present

## 2019-11-06 DIAGNOSIS — I420 Dilated cardiomyopathy: Secondary | ICD-10-CM | POA: Diagnosis present

## 2019-11-06 DIAGNOSIS — E785 Hyperlipidemia, unspecified: Secondary | ICD-10-CM | POA: Diagnosis not present

## 2019-11-06 DIAGNOSIS — R0902 Hypoxemia: Secondary | ICD-10-CM | POA: Diagnosis present

## 2019-11-06 DIAGNOSIS — I10 Essential (primary) hypertension: Secondary | ICD-10-CM | POA: Diagnosis not present

## 2019-11-06 DIAGNOSIS — Z888 Allergy status to other drugs, medicaments and biological substances status: Secondary | ICD-10-CM | POA: Diagnosis not present

## 2019-11-06 DIAGNOSIS — Z87891 Personal history of nicotine dependence: Secondary | ICD-10-CM | POA: Diagnosis not present

## 2019-11-06 DIAGNOSIS — I951 Orthostatic hypotension: Secondary | ICD-10-CM | POA: Diagnosis not present

## 2019-11-06 DIAGNOSIS — Z8774 Personal history of (corrected) congenital malformations of heart and circulatory system: Secondary | ICD-10-CM | POA: Diagnosis not present

## 2019-11-06 LAB — RENAL FUNCTION PANEL
Albumin: 3.3 g/dL — ABNORMAL LOW (ref 3.5–5.0)
Anion gap: 7 (ref 5–15)
BUN: 17 mg/dL (ref 6–20)
CO2: 35 mmol/L — ABNORMAL HIGH (ref 22–32)
Calcium: 8.2 mg/dL — ABNORMAL LOW (ref 8.9–10.3)
Chloride: 95 mmol/L — ABNORMAL LOW (ref 98–111)
Creatinine, Ser: 0.87 mg/dL (ref 0.61–1.24)
GFR calc Af Amer: >60 mL/min (ref >60)
GFR calc non Af Amer: >60 mL/min (ref >60)
Glucose, Bld: 100 mg/dL — ABNORMAL HIGH (ref 70–99)
Phosphorus: 3.1 mg/dL (ref 2.5–4.6)
Potassium: 3.9 mmol/L (ref 3.5–5.1)
Sodium: 137 mmol/L (ref 135–145)

## 2019-11-06 LAB — CBC WITH DIFFERENTIAL/PLATELET
Abs Immature Granulocytes: 0.01 10*3/uL (ref 0.00–0.07)
Basophils Absolute: 0 10*3/uL (ref 0.0–0.1)
Basophils Relative: 1 %
Eosinophils Absolute: 0.1 10*3/uL (ref 0.0–0.5)
Eosinophils Relative: 3 %
HCT: 43.5 % (ref 39.0–52.0)
Hemoglobin: 13.4 g/dL (ref 13.0–17.0)
Immature Granulocytes: 0 %
Lymphocytes Relative: 37 %
Lymphs Abs: 1.2 10*3/uL (ref 0.7–4.0)
MCH: 30.7 pg (ref 26.0–34.0)
MCHC: 30.8 g/dL (ref 30.0–36.0)
MCV: 99.8 fL (ref 80.0–100.0)
Monocytes Absolute: 0.5 10*3/uL (ref 0.1–1.0)
Monocytes Relative: 14 %
Neutro Abs: 1.5 10*3/uL — ABNORMAL LOW (ref 1.7–7.7)
Neutrophils Relative %: 45 %
Platelets: 126 10*3/uL — ABNORMAL LOW (ref 150–400)
RBC: 4.36 MIL/uL (ref 4.22–5.81)
RDW: 13.2 % (ref 11.5–15.5)
WBC: 3.3 10*3/uL — ABNORMAL LOW (ref 4.0–10.5)
nRBC: 0 % (ref 0.0–0.2)

## 2019-11-06 LAB — GLUCOSE, CAPILLARY
Glucose-Capillary: 91 mg/dL (ref 70–99)
Glucose-Capillary: 108 mg/dL — ABNORMAL HIGH (ref 70–99)
Glucose-Capillary: 88 mg/dL (ref 70–99)
Glucose-Capillary: 102 mg/dL — ABNORMAL HIGH (ref 70–99)

## 2019-11-06 LAB — MAGNESIUM: Magnesium: 2.5 mg/dL — ABNORMAL HIGH (ref 1.7–2.4)

## 2019-11-06 MED ORDER — SACUBITRIL-VALSARTAN 49-51 MG PO TABS
1.0000 | ORAL_TABLET | Freq: Two times a day (BID) | ORAL | Status: DC
  Administered 2019-11-06 – 2019-11-07 (×2): 1 via ORAL
  Filled 2019-11-06 (×3): qty 1

## 2019-11-06 MED ORDER — TORSEMIDE 20 MG PO TABS
20.0000 mg | ORAL_TABLET | Freq: Two times a day (BID) | ORAL | Status: DC
  Administered 2019-11-06 – 2019-11-07 (×2): 20 mg via ORAL
  Filled 2019-11-06 (×2): qty 1

## 2019-11-06 NOTE — Consult Note (Addendum)
Cardiology Consultation:   Patient ID: Kevin Cardenas; [id number]; July 17, 1975   Admit date: 11/04/2019 Date of Consult: 11/06/2019  Primary Care Provider: [name], MD Primary Cardiologist: [name], MD 11/01/2019 Primary Electrophysiologist:  Virl Axe, MD 09/18/2017   Patient Profile:   Kevin Cardenas is a 44 y.o. male with a hx of  VSD repair and removal of a pulmonary artery band at age 55, DM, HTN, systolic heart failure, NICM and OSA diagnosed in past but not on CPAP. Has St Jude ICD (placed 01/2015), who is being seen today for the evaluation of orthostatic hypotension at the request of Dr Marylyn Ishihara.  Echo in 3/20 showed EF 25%, mild LV dilation, mildly decreased RV systolic function.   CPX in 10/20 showed moderate functional limitation primarily due to restrictive lung disease.  DM had planned to get PFTs and a high resolution CT chest to followup on lung restriction but was never scheduled.   History of Present Illness:   Mr. Nettleton was seen in the office 07/20. Wt up 22 lbs since last CHF visit 12/2018, increased DOE, +orthopnea and bendopnea, had a 2-3 week gap in meds about a month ago. Lasix stopped, start torsemide 60 mg bid x 3 days, then 40 mg bid, metolazone 2.5 mg x 1 dose. Other meds as previous.  Consider Farxiga at follow-up.  Arrange for full PFTs and high-resolution CT chest after diuresis.  Pt admitted 07/23 with bleeding varicosity and hypotension requiring hydration, Cards asked to see.   Mr Collister got the torsemide on the 20th.  He took the metolazone x1 on the 21st as directed and then started the torsemide 60 mg twice daily.  He does not have working scales, so cannot weigh daily.  He tries to limit all liquids to 2 L daily.  He tries to watch the sodium in his diet.  He had a great deal of lower extremity edema, that improved with the diuretics and his legs are now back to baseline.  His shortness of breath also improved.  Since being in the  hospital, he feels well.  He is on oxygen currently and has not really walked outside the room so is not sure if his respirations are at baseline.  He no longer feels dizzy or lightheaded.  His symptoms have greatly improved.  The varicosity that was bleeding is bandaged and dressed with no blood on the dressing.  Labs (1/16): K 4.1, creatinine 0.9, TSH normal, LDL 89, HDL 46 Labs (05/24/2014): K 4.1 Creatinine 0.97  Labs (3/16): BNP 54, K 4.4, creatinine 1.0 Labs 08/14/2014: K 4.0 Creatinine 0.93  Labs (6/16): digoxin < 0.2 Labs (9/16): K 3.7, creatinine 0.93, digoxin < 0.5, HCT 40.4 Labs (01/2016): dig 0.5 K 4.1 Creatinine 0.94  Labs (09/2017): K 4.3 Creatinine 0.95 Labs (3/20): K 4.3, creatinine 1.02, digoxin < 0.2, LDL 79 Labs (9/20): K 3.8, creatinine 0.82, digoxin < 0.2 Labs (7/21): K 3.8, creatinine 1.0, BNP 81, TSH normal  PMH: 1. VSD repair at 1.  2. Type 2 diabetes. 3. HTN 4. OSA: Not using CPAP.  5. Chronic systolic CHF: Nonischemic cardiomyopathy. St Jude ICD.  - Echo 2009: EF 35-40% - LHC 2009: No angiographic coronary disease.  -Echo 9/11: LVEF 20-25% -Echo11/12: LVEF 30-35%, diffuse hypokinesis and mild LVH. RV systolic mildly reduced. LA mildly dilated. Pulmonic valve with mild stenosis. -Echo 05/12/2014: EF 15-20% Grade IDD, LV moderately dilated. RV normal. Mild pulmonc stenosis mean peak gradient 14.  -Echo 7/16: EF 25-30%,  mild LV dilation, mild LVH, trivial pulmonic stenosis with peak gradient 11 mmHg, mildly decreased RV function with mild RV dilation. -CPX (3/16): RER 1.03, peak VO2 19.9, VE/VCO2 slope 18, mild to moderately impaired functional capacity. There was moderate to severe mixed restrictive/obstructive pattern on spirometry =>exertional hypoxemia, oxygen saturation decreased to 84%.  - RHC (4/16): mean RA 9, PA 35/12, mean PCWP 8, CI 1.8 Fick, CI 2.4 thermo.  -Echo 02/2106:EF30-35% Grade IIDD  - Echo 3/20: EF 25%, mild LV dilation,  diffuse hypokinesis, mildly decreased RV systolic function with mild RV dilation, no definite PS noted.  - CPX (10/20): VE/VCO2 16, peak VO2 15.8 (57% predicted), RER 1.08.  Moderate functional limitation primarily due to restrictive lung disease.  Severe restriction on PFTs.  6. Pulmonic stenosis: Mild in past, not significant on 2/20 echo.   Past Medical History:  Diagnosis Date  . Chronic systolic CHF (congestive heart failure) (HCC)    EF 25% by echo 06/2018  . Diabetes mellitus   . Hypertension   . Non-ischemic cardiomyopathy ([place])   . Obstructive sleep apnea   . VSD (ventricular septal defect) 1978   repaired at age 48    Past Surgical History:  Procedure Laterality Date  . EP IMPLANTABLE DEVICE N/A 01/19/2015   Procedure: ICD Implant;  Surgeon: Deboraha Sprang, MD;  Location: [facility];  Service: Cardiovascular;  Laterality: N/A;  . RIGHT HEART CATHETERIZATION N/A 08/03/2014   Procedure: RIGHT HEART CATH;  Surgeon: Larey Dresser, MD;  Location: [facility] CATH [facility];  Service: Cardiovascular;  Laterality: N/A;  . VSD REPAIR       Prior to Admission medications   Medication Sig Start Date End Date Taking? Authorizing Provider  albuterol (PROVENTIL) (2.5 MG/3ML) 0.083% nebulizer solution Take 3 mLs (2.5 mg total) by nebulization every 6 (six) hours as needed for wheezing or shortness of breath. 01/15/16  Yes [name], MD  ascorbic acid (VITAMIN C) 500 MG tablet Take 500-1,000 mg by mouth daily.   Yes [provider]  aspirin 81 MG chewable tablet Chew 1 tablet (81 mg total) by mouth daily. 04/11/12  Yes Ripley Fraise, MD  carvedilol (COREG) 25 MG tablet TAKE 1 TABLET BY MOUTH 2 TIMES DAILY WITH A MEAL. Patient taking differently: Take 25 mg by mouth 2 (two) times daily with a meal.  09/27/19  Yes Tillery, Satira Mccallum, PA-C  digoxin (LANOXIN) 0.125 MG tablet TAKE 1 TABLET (125 MCG TOTAL) BY MOUTH DAILY. Patient taking differently: Take 0.125 mg by mouth daily.   09/27/19  Yes Shirley Friar, PA-C  fluticasone furoate-vilanterol (BREO ELLIPTA) 200-25 MCG/INH AEPB Inhale 1 puff into the lungs daily. Patient taking differently: Inhale 1 puff into the lungs daily as needed (for seasonal flares).  08/05/17  Yes Givanni Staron, Shaune Pascal, MD  isosorbide-hydrALAZINE (BIDIL) 20-37.5 MG tablet Take 2 tablets by mouth 3 (three) times daily. 09/27/19  Yes Shirley Friar, PA-C  sacubitril-valsartan (ENTRESTO) 97-103 MG Take 1 tablet by mouth 2 (two) times daily. Please call for office visit [phone] 09/27/19  Yes Larey Dresser, MD  simvastatin (ZOCOR) 20 MG tablet Take 1 tablet (20 mg total) by mouth at bedtime. Please call for office visit [phone] Patient taking differently: Take 20 mg by mouth at bedtime.  09/27/19  Yes Larey Dresser, MD  spironolactone (ALDACTONE) 25 MG tablet TAKE 1 TABLET (25 MG TOTAL) BY MOUTH DAILY. Patient taking differently: Take 25 mg by mouth daily.  09/27/19  Yes  Deboraha Sprang, MD  torsemide (DEMADEX) 20 MG tablet Take 2 tablets (40 mg total) by mouth 2 (two) times daily. 11/01/19  Yes Larey Dresser, MD  metolazone (ZAROXOLYN) 2.5 MG tablet Take 1 tablet (2.5 mg total) by mouth as directed. 11/01/19   Larey Dresser, MD  potassium chloride SA (KLOR-CON) 20 MEQ tablet Take 1 tablet (20 mEq total) by mouth daily. 11/01/19   Larey Dresser, MD    Inpatient Medications: Scheduled Meds: . digoxin  0.125 mg Oral Daily  . fluticasone furoate-vilanterol  1 puff Inhalation Daily  . potassium chloride  40 mEq Oral Daily   Continuous Infusions:  PRN Meds: acetaminophen, albuterol, traMADol  Allergies:    Allergies  Allergen Reactions  . Metolazone Other (See Comments)    "Dried up my kidneys- took only one tablet"    Social History:   Social History   Socioeconomic History  . Marital status: Single    Spouse name: Recently Married   . Number of children: 0  . Years of education: Not on file  . Highest  education level: Not on file  Occupational History  . 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   Tobacco Use  . Smoking status: Former Smoker    Packs/day: 0.30    Years: 15.00    Pack years: 4.50    Types: Cigarettes    Quit date: 04/08/2011    Years since quitting: 8.5  . Smokeless tobacco: Never Used  . Tobacco comment: Smokes depends on who is around him.    Substance and Sexual Activity  . Alcohol use: No    Alcohol/week: 0.0 standard drinks    Comment: Rarely   . Drug use: No  . Sexual activity: Not Currently  Other Topics Concern  . Not on file  Social History Narrative   Caffienated drinks-no   Seat belt use often-no   Regular Exercise-   Smoke alarm in the home-yes   Firearms/guns in the home-no   History of physical abuse-no               Social Determinants of Health   Financial Resource Strain:   . Difficulty of Paying Living Expenses:   Food Insecurity:   . Worried About Charity fundraiser in the Last Year:   . Arboriculturist in the Last Year:   Transportation Needs:   . Film/video editor (Medical):   Marland Kitchen Lack of Transportation (Non-Medical):   Physical Activity:   . Days of Exercise per Week:   . Minutes of Exercise per Session:   Stress:   . Feeling of Stress :   Social Connections:   . Frequency of Communication with Friends and Family:   . Frequency of Social Gatherings with Friends and Family:   . Attends Religious Services:   . Active Member of Clubs or Organizations:   . Attends Archivist Meetings:   Marland Kitchen Marital Status:   Intimate Partner Violence:   . Fear of Current or Ex-Partner:   . Emotionally Abused:   Marland Kitchen Physically Abused:   . Sexually Abused:     Family History:   Family History  Problem Relation Age of Onset  . Lung cancer Father   . Stroke Father   . Stroke Mother   . Diabetes Maternal Grandfather   . Diabetes Paternal Grandmother   . Cancer Neg Hx   .  Alcohol abuse  Neg Hx   . Drug abuse Neg Hx   . Early death Neg Hx   . Heart disease Neg Hx   . Hyperlipidemia Neg Hx   . Hypertension Neg Hx   . Kidney disease Neg Hx    Family Status:  Family Status  Relation Name Status  . Father  Alive  . Mother  Alive  . MGF  (Not Specified)  . PGM  (Not Specified)  . Neg Hx  (Not Specified)    ROS:  Please see the history of present illness.  All other ROS reviewed and negative.     Physical Exam/Data:   Vitals:   11/05/19 1416 11/05/19 1938 11/06/19 0024 11/06/19 0452  BP: 107/68 (!) 112/59    Pulse: 76  65   Resp: _0 Temp: 98.8 F (37.1 C) 98 F (36.7 C)  98 F (36.7 C)  TempSrc: Oral Oral  Oral  SpO2: 98%  97%   Weight:    (!) 106.6 kg  Height:        Intake/Output Summary (Last 24 hours) at 11/06/2019 0855 Last data filed at 11/06/2019 0803 Gross per 24 hour  Intake 720 ml  Output 2600 ml  Net -1880 ml    Last 3 Weights 11/06/2019 11/04/2019 11/01/2019  Weight (lbs) 235 lb 255 lb 255 lb  Weight (kg) 106.595 kg 115.667 kg 115.667 kg     Body mass index is 34.7 kg/m.   General:  Well nourished, well developed, male in no acute distress HEENT: normal Lymph: no adenopathy Neck: JVD -not seen elevated Endocrine:  No thryomegaly Vascular: No carotid bruits; 4/4 extremity pulses 2+  Cardiac:  normal S1, S2; RRR; soft murmur Lungs:  clear bilaterally, no wheezing, rhonchi or rales  Abd: soft, nontender, no hepatomegaly  Ext: no edema Musculoskeletal:  No deformities, BUE and BLE strength normal and equal Skin: warm and dry  Neuro:  CNs 2-12 intact, no focal abnormalities noted Psych:  Normal affect   EKG:  The EKG was personally reviewed and demonstrates: 7/20 ECG is sinus rhythm, heart rate 72, nonspecific IVCD with QRS duration 130 ms Telemetry:  Telemetry was personally reviewed and demonstrates: Sinus rhythm with occasional PVCs   CV studies:   ECHO: ordered  ECHO: 06/15/2018 1. The left  ventricle has a visually estimated ejection fraction of of  25%. The cavity size was mildly dilated. Left ventricular diastolic  Doppler parameters are consistent with pseudonormalization Left  ventricular diffuse hypokinesis.  2. The right ventricle has mildly reduced systolic function. The cavity  was mildly enlarged. There is no increase in right ventricular wall  thickness.  3. Trivial pericardial effusion is present.  4. The mitral valve is normal in structure. No evidence of mitral valve  stenosis. No mitral regurgitation.  5. The tricuspid valve is normal in structure.  6. The aortic valve is tricuspid no stenosis of the aortic valve.  7. The aortic root and ascending aorta are normal in size and structure.  8. Trivial pulmonic insufficiency, the gradient across the pulmonary  valve did not appear elevated on this study.  9. IVC normal in size. No complete TR doppler jet so unable to estimate  PA systolic pressure.   CPX:  02/07/2019 Notes: Patient gave a very good effort. Pulse-oximetry remained 95-98% for the earlier, lower intensities of exercise. Nearing the final stages and higher intensities patient desaturated to a low of 82% at peak exercise, ultimately, requiring exercise termination. This is  consistent with previous CPX.    ECG: Resting ECG in normal sinus rhythm with left axis deviation and left ventricular hypertrophy. HR response milldly blunted (consider only near maximal exercise and beta-blockade). There were occasional PVCs during recovery with no sustained arrhythmias or ST-T changes during exercise. BP response hypertensive at peak exercise.   PFT: Pre-exercise spirometry demonstrates severe obstructive patterns. MVV below normal.   CPX: Exercise testing with gas exchange demonstrates a moderate to severely reduced peak VO2 of 15.8 ml/kg/min (57% of the age/gender/weight matched sedentary norms). The RER of 1.08 indicates a near maximal effort. When  adjusted to the patient's ideal body weight of 158.4 lb (71.7 kg) the peak VO2 is 23.4 ml/kg (ibw)/min (61% of the ibw-adjusted predicted). The VE/VCO2 slope is abnormally low, and is indicative of abnormal ventilation ratios. The oxygen uptake efficiency slope (OUES) is normal. The VO2 at the ventilatory threshold was normal at 49% of the predicted peak VO2. At peak exercise, the ventilation reached 96% of the measured MVV indicating ventilatory reserve was nearly depleted. The O2pulse (a surrogate for stroke volume) increased with initial incremental exercise, reaching a prematurely flattened peak of 52m/beat (100% predicted).    Conclusion: Exercise testing with gas exchange demonstrates moderate to severe functional impairment when compared to matched sedentary norms. Pre-exercise spirometry demonstrates moderate obstructive pulmonary patterns. Furthermore, desaturations, abnormally low VE/VCO2 in the presence of high PETCO2 at peak exercise and diagnosis of OSA are indicative of obesity hypoventilation syndrome. There is likely a heart failure limiting component, however, patient's primary limitation appears related to body habitus with desaturations and higher PETCO2, despite elevated ventilation. There was also mild chronotropic incompetence and a hypertensive response to exercise.   CATH: RIGHT 08/03/2014 Procedural Findings: Hemodynamics (mmHg) RA mean 9 RV 42/9 PA 35/12, mean 22 PCWP mean 8  Peak to peak gradient across pulmonic valve 7 mmHg, mean gradient 6 mmHg.   Oxygen saturations: RA 56% RV 60% PA 61% AO 93%  Cardiac Output (Fick) 3.44  Cardiac Index (Fick) 1.77  Cardiac Output (Thermo) 4.73 Cardiac Index (Thermo) 2.44    Final Conclusions:  Normal PCWP and PA pressure.  Mild pulmonic stenosis.  Cardiac output significantly decreased by Fick but only mildly decreased by thermodilution.  Will follow closely in clinic, may need addition of digoxin.    CATH: LEFT  07/22/2007 IMPRESSION:  1. Severe nonischemic dilated cardiomyopathy, ejection fraction of 35-      40%.  2. Moderate to severe pulmonary hypertension.  3. Congenital heart disease status post ventricular septal repair with      no residual left-to-right shunting by right heart catheterization.   RECOMMENDATIONS:  He will need medical therapy for now.  Should consider  placement of ICD for primary prevention of sudden cardiac death.  He did  have one episode of nonsustained ventricle tachycardia while he was in  the hospital.  Weight loss, exercise and diabetes control is indicated.   Laboratory Data:   Chemistry Recent Labs  Lab 11/04/19 1735 11/05/19 0453 11/06/19 0442  NA 137 138 137  K 3.6 3.1* 3.9  CL 87* 90* 95*  CO2 35* 37* 35*  GLUCOSE 100* 100* 100*  BUN 37* 30* 17  CREATININE 2.34* 1.44* 0.87  CALCIUM 8.6* 8.3* 8.2*  GFRNONAA 33* 59* >60  GFRAA 38* >60 >60  ANIONGAP _0 Lab Results  Component Value Date   ALT 19 01/15/2016   AST 24 10/20/2019   ALKPHOS 72 10/20/2019  BILITOT 0.7 10/20/2019   Hematology Recent Labs  Lab 11/04/19 1735 11/05/19 0453 11/06/19 0442  WBC 4.2 3.5* 3.3*  RBC 4.78 4.50 4.36  HGB 15.0 14.0 13.4  HCT 46.7 44.2 43.5  MCV 97.7 98.2 99.8  MCH 31.4 31.1 30.7  MCHC 32.1 31.7 30.8  RDW 13.8 13.7 13.2  PLT 131* 129* 126*   Cardiac Enzymes High Sensitivity Troponin:  No results for input(s): TROPONINIHS in the last 720 hours.    BNPNo results for input(s): BNP, PROBNP in the last 168 hours.   Lab Results  Component Value Date   DIGOXIN 0.4 (L) 11/05/2019   TSH:  Lab Results  Component Value Date   TSH 2.030 10/20/2019   Lipids: Lab Results  Component Value Date   CHOL 137 06/15/2018   HDL 39 (L) 06/15/2018   LDLCALC 79 06/15/2018   TRIG 97 06/15/2018   CHOLHDL 3.5 06/15/2018   HgbA1c: Lab Results  Component Value Date   HGBA1C 6.1 01/15/2016   Magnesium:  Magnesium  Date Value Ref Range Status    11/06/2019 2.5 (H) 1.7 - 2.4 mg/dL Final    Comment:    Performed at [facility], [place] [address] [place], [place] [address]     Radiology/Studies:  DG Chest 2 View  Result Date: 11/04/2019 CLINICAL DATA:  Hypoxia EXAM: CHEST - 2 VIEW COMPARISON:  01/15/2016 FINDINGS: There is a single lead left-sided ICD in place. The heart size is enlarged but stable from prior study. There is no pneumothorax. No large pleural effusion. There is stable blunting of the right costophrenic angle. IMPRESSION: No active cardiopulmonary disease. Electronically Signed   By: Constance Holster M.D.   On: 11/04/2019 22:57    Assessment and Plan:   1.  Chronic systolic CHF: -His weight was 233 pounds 12/17/2018 CHF visit, 246 pounds 4/28, 248 pounds 7/8 and 255 pounds when seen by [name] on 7/20 -The increased diuretic therapy was effective, getting him down 20 pounds. -He took the metolazone only once as directed.  It is not clear if he got all 6 doses of torsemide 60 mg. -Work with staff here to get him a set of scales -He does better with scheduled diuretic dosing and possibly a weekly dose of metolazone or increased torsemide -Discuss with MD if he should be on torsemide 40 mg twice daily per Dr. Claris Gladden note at discharge -He will need early follow-up in the heart failure clinic to make sure he is stable on this regimen -Prior to admission, he was on Coreg 25 mg twice daily, BiDil 2 tablets 3 times daily, Entresto 97-103 mg twice daily, spironolactone 25 mg daily addition to the diuretic therapy -He is off all of these medications, discuss how to restart them with MD -We need a way to ensure compliance as an outpatient  2.  Orthostatic hypotension: -On admission, his blood pressure dropped from 103/67 down to 83/65 with standing. - Today, his blood pressure was 97/47 lying and 110/71 standing at 3 minutes -He seems asymptomatic with a systolic blood pressure greater than 90 -He is currently off  his meds, discuss restarting them with MD  3.  AKI - BUN/Cr 12/1.01 on 07/20                  37/2.34 on admission         now  17/0.87.  4.  OSA on CPAP -Compliance encouraged  5.  History of VSD with repair and mild  or not significant pulmonic stenosis -Echocardiogram ordered  Otherwise, per IM Principal Problem:   Orthostatic hypotension Active Problems:   Non-ischemic cardiomyopathy (HCC)   Systolic CHF, chronic (HCC)   VSD (ventricular septal defect)   HTN (hypertension)   Hyperlipidemia with target LDL less than 100   OSA on CPAP   AKI (acute kidney injury) ([place])     For questions or updates, please contact [facility] Please consult [email or web address] for contact info under Cardiology/STEMI.   Signed, Rosaria Ferries, PA-C  11/06/2019 8:55 AM   Patient seen and examined with the above-signed Advanced Practice Provider and/or Housestaff. I personally reviewed laboratory data, imaging studies and relevant notes. I independently examined the patient and formulated the important aspects of the plan. I have edited the note to reflect any of my changes or salient points. I have personally discussed the plan with the patient and/or family.  44 y/o male as above with h/o congenital heart disease s/p repair and systolic HF due to NICM, EF 25%. Recently had volume overload and diuretics increased as outpatient. Admitted with AKI, hypotension and volume depletion.   HF meds held. And given > 2L IVF. BP and creatinine now improved.    Previously was on lasix 60/40 but then switched to torsemide 40 bid which he has at home.   Denies SOB, orthopnea or PND.  General:  Well appearing. No resp difficulty HEENT: normal Neck: supple. no JVD. Carotids 2+ bilat; no bruits. No lymphadenopathy or thryomegaly appreciated. Cor: PMI nondisplaced. Regular rate & rhythm. No rubs, gallops or murmurs. Lungs: clear Abdomen: obese soft, nontender, nondistended. No hepatosplenomegaly. No bruits  or masses. Good bowel sounds. Extremities: no cyanosis, clubbing, rash, edema Neuro: alert & orientedx3, cranial nerves grossly intact. moves all 4 extremities w/o difficulty. Affect pleasant  He is much improved with volume resuscitation. I think he is ok for d/c. Finding right diuretic regimen will be difficult in setting of variable compliance with meds and dietary intake.   Would cut Entresto to 49/51 bid and drop torsemide to 20 bid (restart Tuesday). Will need to arrange f/u in HF Clinic soon. May benefit from [place] in future.   D/w Dr. Marylyn Ishihara. Appreciate his care.   Glori Bickers, MD  1:14 PM

## 2019-11-06 NOTE — Progress Notes (Signed)
PROGRESS NOTE    Kevin Cardenas  HBZ:[id number] DOB: April 29, 1975 DOA: 11/04/2019 PCP: [name], MD   Brief Narrative:   Today the patient was sitting on his porch when a varicosity began bleeding heavily. EMS was called and the bleeding was stopped in the ED with a figure 8 stitch. Afterwards the patient was very dizzy.The patient had been having issues with dizziness upon standing. He states that he had noticed increased shortness of breath and swelling of his lower extremities. His diuretics were increased. His SOB and swelling improved. But he began to feel badly and to be short of breath  7/25: Scr normalized. BP ok. Resume entresto, demadex at half dose. Appreciate cardiology assistance.   Assessment & Plan:   Principal Problem:   Orthostatic hypotension Active Problems:   Non-ischemic cardiomyopathy (HCC)   Systolic CHF, chronic (HCC)   VSD (ventricular septal defect)   HTN (hypertension)   Hyperlipidemia with target LDL less than 100   OSA on CPAP   AKI (acute kidney injury) (HCC)  Orthostatic hypotension     - Likely due to volume depletion as a result of recently increased diuretic dosing.      - Pt w/ severe HFrEF, so gentle fluids in small alloquots until BP/SCr stable.     - Antihypertensives and diuretics held at admission.     - 7/25: resolved w/ hydration.  AKI      - Baseline creatinine is 1.0. SCr is up to 2.34 at admission.     - 7/25: resolved, Scr is 0.87  Hyperlipidemia     - Statin held due to elevated SCr.     - SCR is improved, can resume statin     - 7/25: resume statin at discharge  Non-ischemic CMO/CHF:      - Gentle IVF in small alloquots.      - Restart diuretics and antihypertensives when creatinine and volume status allows.      - EF as of echo in 07/12/2018 is 25%. There was also diffuse ventricular hypokinesis.      - Digoxin has been adjusted d/t elevated SCr     - Recent change in diuretics, I have asked cards to assess.  Appreciate their assistance     - 7/25: Eval'd by cards. Spoke with cards directly. Will cut back entresto to 49/51 and demadex to 65m BID. I will start these medications under guidance tonight. If he is stable in AM, will discharge to home. None of his other BP/HF medications have been started during this stay.   Hypertension     - Antihypertensives have been held due to soft pressures     - 7/25: entresto and demadex to be resumed at half his previous dose. Follow.   Hypokalemia     - replaced, follow outpt  DVT prophylaxis: SCDs Code Status: FULL Family Communication: Spoke with wife by phone.   Status is: Observation  The patient will require care spanning > 2 midnights and should be moved to inpatient because: Hemodynamically unstable and Inpatient level of care appropriate due to severity of illness  Dispo: The patient is from: Home              Anticipated d/c is to: Home              Anticipated d/c date is: 1 day              Patient currently is not medically stable to d/c.   Consultants:  Cardiology  Procedures:   None  Antimicrobials:  . None   ROS:  Denies CP, N, V, dyspnea . Remainder 10-pt ROS is negative for all not previously mentioned.  Subjective: "Thank you."  Objective: Vitals:   11/06/19 0024 11/06/19 0452 11/06/19 1133 11/06/19 1308  BP:    111/82  Pulse: 65   80  Resp: _0 Temp:  98 F (36.7 C)  99 F (37.2 C)  TempSrc:  Oral  Oral  SpO2: 97%  99% 98%  Weight:  (!) 106.6 kg    Height:        Intake/Output Summary (Last 24 hours) at 11/06/2019 1526 Last data filed at 11/06/2019 1116 Gross per 24 hour  Intake 480 ml  Output 2100 ml  Net -1620 ml   Filed Weights   11/04/19 1427 11/06/19 0452  Weight: (!) 115.7 kg (!) 106.6 kg    Examination:  General: 44 y.o. male resting in bed in NAD Cardiovascular: RRR, +S1, S2, no m/g/r, equal pulses throughout Respiratory: CTABL, no w/r/r, normal WOB GI: BS+, NDNT, no masses  noted, no organomegaly noted MSK: No e/c/c Neuro: A&O x 3, no focal deficits Psyc: Appropriate interaction and affect, calm/cooperative  Data Reviewed: I have personally reviewed following labs and imaging studies.  CBC: Recent Labs  Lab 11/04/19 1735 11/05/19 0453 11/06/19 0442  WBC 4.2 3.5* 3.3*  NEUTROABS  --   --  1.5*  HGB 15.0 14.0 13.4  HCT 46.7 44.2 43.5  MCV 97.7 98.2 99.8  PLT 131* 129* 623*   Basic Metabolic Panel: Recent Labs  Lab 11/01/19 1200 11/04/19 1735 11/05/19 0453 11/06/19 0442  NA 139 137 138 137  K 3.8 3.6 3.1* 3.9  CL 100 87* 90* 95*  CO2 30 35* 37* 35*  GLUCOSE 146* 100* 100* 100*  BUN 12 37* 30* 17  CREATININE 1.01 2.34* 1.44* 0.87  CALCIUM 8.5* 8.6* 8.3* 8.2*  MG  --   --   --  2.5*  PHOS  --   --   --  3.1   GFR: Estimated Creatinine Clearance: 130.4 mL/min (by C-G formula based on SCr of 0.87 mg/dL). Liver Function Tests: Recent Labs  Lab 11/06/19 0442  ALBUMIN 3.3*   No results for input(s): LIPASE, AMYLASE in the last 168 hours. No results for input(s): AMMONIA in the last 168 hours. Coagulation Profile: No results for input(s): INR, PROTIME in the last 168 hours. Cardiac Enzymes: No results for input(s): CKTOTAL, CKMB, CKMBINDEX, TROPONINI in the last 168 hours. BNP (last 3 results) No results for input(s): PROBNP in the last 8760 hours. HbA1C: No results for input(s): HGBA1C in the last 72 hours. CBG: Recent Labs  Lab 11/05/19 1142 11/05/19 1701 11/05/19 2052 11/06/19 0730 11/06/19 1114  GLUCAP 130* 106* 133* 91 108*   Lipid Profile: No results for input(s): CHOL, HDL, LDLCALC, TRIG, CHOLHDL, LDLDIRECT in the last 72 hours. Thyroid Function Tests: No results for input(s): TSH, T4TOTAL, FREET4, T3FREE, THYROIDAB in the last 72 hours. Anemia Panel: No results for input(s): VITAMINB12, FOLATE, FERRITIN, TIBC, IRON, RETICCTPCT in the last 72 hours. Sepsis Labs: No results for input(s): PROCALCITON, LATICACIDVEN in  the last 168 hours.  Recent Results (from the past 240 hour(s))  SARS Coronavirus 2 by RT PCR (hospital order, performed in [facility] hospital lab) Nasopharyngeal Nasopharyngeal Swab     Status: None   Collection Time: 11/04/19  8:00 PM   Specimen: Nasopharyngeal Swab  Result Value Ref  Range Status   SARS Coronavirus 2 NEGATIVE NEGATIVE Final    Comment: (NOTE) SARS-CoV-2 target nucleic acids are NOT DETECTED.  The SARS-CoV-2 RNA is generally detectable in upper and lower respiratory specimens during the acute phase of infection. The lowest concentration of SARS-CoV-2 viral copies this assay can detect is 250 copies / mL. A negative result does not preclude SARS-CoV-2 infection and should not be used as the sole basis for treatment or other patient management decisions.  A negative result may occur with improper specimen collection / handling, submission of specimen other than nasopharyngeal swab, presence of viral mutation(s) within the areas targeted by this assay, and inadequate number of viral copies (<250 copies / mL). A negative result must be combined with clinical observations, patient history, and epidemiological information.  Fact Sheet for Patients:   StrictlyIdeas.no  Fact Sheet for Healthcare Providers: BankingDealers.co.za  This test is not yet approved or  cleared by the [place] FDA and has been authorized for detection and/or diagnosis of SARS-CoV-2 by FDA under an Emergency Use Authorization (EUA).  This EUA will remain in effect (meaning this test can be used) for the duration of the COVID-19 declaration under Section 564(b)(1) of the Act, 21 U.S.C. section 360bbb-3(b)(1), unless the authorization is terminated or revoked sooner.  Performed at [facility], [place] [address] [place], [place] [address]       Radiology Studies: DG Chest 2 View  Result Date: 11/04/2019 CLINICAL DATA:  Hypoxia EXAM:  CHEST - 2 VIEW COMPARISON:  01/15/2016 FINDINGS: There is a single lead left-sided ICD in place. The heart size is enlarged but stable from prior study. There is no pneumothorax. No large pleural effusion. There is stable blunting of the right costophrenic angle. IMPRESSION: No active cardiopulmonary disease. Electronically Signed   By: Constance Holster M.D.   On: 11/04/2019 22:57     Scheduled Meds: . digoxin  0.125 mg Oral Daily  . fluticasone furoate-vilanterol  1 puff Inhalation Daily  . potassium chloride  40 mEq Oral Daily   Continuous Infusions:   LOS: 0 days    Time spent: 25 minutes spent in the coordination of care today.    Jonnie Finner, DO [facility]  If 7PM-7AM, please contact night-coverage [email or web address] 11/06/2019, 3:26 PM

## 2019-11-06 NOTE — Discharge Summary (Signed)
Physician Discharge Summary  Kevin Cardenas TDH:[id number] DOB: January 17, 1976 DOA: 11/04/2019  PCP: [name], MD  Admit date: 11/04/2019 Discharge date: 11/07/2019  Admitted From: Home Disposition:  Discharged to home.   Recommendations for Outpatient Follow-up:  1. Follow up with PCP in 1 week 2. Please obtain BMP/CBC in one week 3. Follow up with cardiology as scheduled.   Discharge Condition: Stable  CODE STATUS: FULL   Brief/Interim Summary: Today the patient was sitting on his porch when a varicosity began bleeding heavily. EMS was called and the bleeding was stopped in the ED with a figure 8 stitch. Afterwards the patient was very dizzy. The patient had been having issues with dizziness upon standing. He states that he had noticed increased shortness of breath and swelling of his lower extremities. His diuretics were increased. His SOB and swelling improved. But he began to feel badly and to be short of breath.  7/26: Feels better today. Labs stable. He denies any complaints. Ok to discharge to home.   Discharge Diagnoses:  Principal Problem:   Orthostatic hypotension Active Problems:   Non-ischemic cardiomyopathy (HCC)   Systolic CHF, chronic (HCC)   VSD (ventricular septal defect)   HTN (hypertension)   Hyperlipidemia with target LDL less than 100   OSA on CPAP   AKI (acute kidney injury) (HCC)   HFrEF (heart failure with reduced ejection fraction) (HCC)   Orthostatic hypotension     - Likely due to volume depletion as a result of recently increased diuretic dosing.      - Pt w/ severe HFrEF, so gentle fluids in small alloquots until BP/SCr stable.     - Antihypertensives and diuretics held at admission.     - resolved w/ hydration.  AKI      - Baseline creatinine is 1.0. SCr is up to 2.34 at admission.     - Resolved, Scr 1.08. Follow up outpt.  Hyperlipidemia     - Statin held due to elevated SCr.     - SCR is improved, can resume statin     - resume  statin at discharge  Non-ischemic CMO/CHF:      - Gentle IVF in small alloquots.      - Restart diuretics and antihypertensives when creatinine and volume status allows.      - EF as of echo in 07/12/2018 is 25%. There was also diffuse ventricular hypokinesis.      - Digoxin has been adjusted d/t elevated SCr     - Recent change in diuretics, I have asked cards to assess. Appreciate their assistance     - Eval'd by cards: "He is much improved with volume resuscitation. I think he is ok for d/c. Finding right diuretic regimen will be difficult in setting of variable compliance with meds and dietary intake. Would cut Entresto to 49/51 bid and drop torsemide to 20 bid (restart Tuesday). Will need to arrange f/u in [facility] Clinic soon. May benefit from [place] in future."  Hypertension     - Antihypertensives have been held due to soft pressures     - Pressures are better. Resume home regimen at discharge.  Hypokalemia     - replaced, follow outpt  Discharge Instructions   Allergies as of 11/07/2019      Reactions   Metolazone Other (See Comments)   "Dried up my kidneys- took only one tablet"      Medication List    STOP taking these medications   Entresto 97-103  MG Generic drug: sacubitril-valsartan Replaced by: sacubitril-valsartan 49-51 MG   metolazone 2.5 MG tablet Commonly known as: ZAROXOLYN     TAKE these medications   albuterol (2.5 MG/3ML) 0.083% nebulizer solution Commonly known as: PROVENTIL Take 3 mLs (2.5 mg total) by nebulization every 6 (six) hours as needed for wheezing or shortness of breath.   ascorbic acid 500 MG tablet Commonly known as: VITAMIN C Take 500-1,000 mg by mouth daily.   aspirin 81 MG chewable tablet Chew 1 tablet (81 mg total) by mouth daily.   BiDil 20-37.5 MG tablet Generic drug: isosorbide-hydrALAZINE Take 2 tablets by mouth 3 (three) times daily.   carvedilol 25 MG tablet Commonly known as: COREG TAKE 1 TABLET BY MOUTH 2 TIMES  DAILY WITH A MEAL. What changed:   how much to take  how to take this  when to take this  additional instructions   digoxin 0.125 MG tablet Commonly known as: LANOXIN TAKE 1 TABLET (125 MCG TOTAL) BY MOUTH DAILY. What changed:   how much to take  how to take this  when to take this  additional instructions   fluticasone furoate-vilanterol 200-25 MCG/INH Aepb Commonly known as: Breo Ellipta Inhale 1 puff into the lungs daily. What changed:   when to take this  reasons to take this   potassium chloride SA 20 MEQ tablet Commonly known as: KLOR-CON Take 1 tablet (20 mEq total) by mouth daily.   sacubitril-valsartan 49-51 MG Commonly known as: ENTRESTO Take 1 tablet by mouth 2 (two) times daily. Replaces: Entresto 97-103 MG   simvastatin 20 MG tablet Commonly known as: ZOCOR Take 1 tablet (20 mg total) by mouth at bedtime. Please call for office visit [phone] What changed: additional instructions   spironolactone 25 MG tablet Commonly known as: ALDACTONE TAKE 1 TABLET (25 MG TOTAL) BY MOUTH DAILY. What changed: See the new instructions.   torsemide 20 MG tablet Commonly known as: DEMADEX Take 1 tablet (20 mg total) by mouth 2 (two) times daily. What changed:   how much to take  when to take this       Follow-up Information    Larey Dresser, MD Follow up.   Specialty: Cardiology Why: Keep scheduled follow up appt on 08/06 Contact information: [place] [address] [phone]        [name], MD Follow up in 1 week(s).   Specialty: Internal Medicine Contact information: [place] [address] [phone]              Allergies  Allergen Reactions  . Metolazone Other (See Comments)    "Dried up my kidneys- took only one tablet"    Consultations:  Cardiology  Procedures/Studies: DG Chest 2 View  Result Date: 11/04/2019 CLINICAL DATA:  Hypoxia EXAM: CHEST - 2 VIEW COMPARISON:   01/15/2016 FINDINGS: There is a single lead left-sided ICD in place. The heart size is enlarged but stable from prior study. There is no pneumothorax. No large pleural effusion. There is stable blunting of the right costophrenic angle. IMPRESSION: No active cardiopulmonary disease. Electronically Signed   By: Constance Holster M.D.   On: 11/04/2019 22:57   CUP PACEART REMOTE DEVICE CHECK  Result Date: 10/20/2019 Scheduled remote reviewed. Normal device function.  Next remote 91 days. Kathy Breach, RN, CCDS, [employer]   Subjective: "I feel good."  Discharge Exam: Vitals:   11/06/19 1940 11/07/19 0524  BP: 114/75 113/74  Pulse:  Resp: 18 18  Temp: 98.6 F (37 C) 98.7 F (37.1 C)  SpO2:  98%   Vitals:   11/06/19 1133 11/06/19 1308 11/06/19 1940 11/07/19 0524  BP:  111/82 114/75 113/74  Pulse:  80    Resp:  _0 Temp:  99 F (37.2 C) 98.6 F (37 C) 98.7 F (37.1 C)  TempSrc:  Oral Oral Oral  SpO2: 99% 98%  98%  Weight:    (!) 105.6 kg  Height:       General: 44 y.o. male resting in bed in NAD Cardiovascular: RRR, +S1, S2, no m/g/r, equal pulses throughout Respiratory: CTABL, no w/r/r, normal WOB GI: BS+, NDNT, no masses noted, no organomegaly noted MSK: No e/c/c Neuro: A&O x 3, no focal deficits Psyc: Appropriate interaction and affect, calm/cooperative  The results of significant diagnostics from this hospitalization (including imaging, microbiology, ancillary and laboratory) are listed below for reference.     Microbiology: Recent Results (from the past 240 hour(s))  SARS Coronavirus 2 by RT PCR (hospital order, performed in [facility] hospital lab) Nasopharyngeal Nasopharyngeal Swab     Status: None   Collection Time: 11/04/19  8:00 PM   Specimen: Nasopharyngeal Swab  Result Value Ref Range Status   SARS Coronavirus 2 NEGATIVE NEGATIVE Final    Comment: (NOTE) SARS-CoV-2 target nucleic acids are NOT DETECTED.  The SARS-CoV-2 RNA is generally  detectable in upper and lower respiratory specimens during the acute phase of infection. The lowest concentration of SARS-CoV-2 viral copies this assay can detect is 250 copies / mL. A negative result does not preclude SARS-CoV-2 infection and should not be used as the sole basis for treatment or other patient management decisions.  A negative result may occur with improper specimen collection / handling, submission of specimen other than nasopharyngeal swab, presence of viral mutation(s) within the areas targeted by this assay, and inadequate number of viral copies (<250 copies / mL). A negative result must be combined with clinical observations, patient history, and epidemiological information.  Fact Sheet for Patients:   StrictlyIdeas.no  Fact Sheet for Healthcare Providers: BankingDealers.co.za  This test is not yet approved or  cleared by the [place] FDA and has been authorized for detection and/or diagnosis of SARS-CoV-2 by FDA under an Emergency Use Authorization (EUA).  This EUA will remain in effect (meaning this test can be used) for the duration of the COVID-19 declaration under Section 564(b)(1) of the Act, 21 U.S.C. section 360bbb-3(b)(1), unless the authorization is terminated or revoked sooner.  Performed at [facility], [place] [address] [place], [place] [address]      Labs: BNP (last 3 results) Recent Labs    10/20/19 1834  BNP 59.2   Basic Metabolic Panel: Recent Labs  Lab 11/01/19 1200 11/04/19 1735 11/05/19 0453 11/06/19 0442 11/07/19 0331  NA 139 137 138 137 139  K 3.8 3.6 3.1* 3.9 4.5  CL 100 87* 90* 95* 98  CO2 30 35* 37* 35* 36*  GLUCOSE 146* 100* 100* 100* 99  BUN 12 37* 30* 17 18  CREATININE 1.01 2.34* 1.44* 0.87 1.08  CALCIUM 8.5* 8.6* 8.3* 8.2* 8.7*  MG  --   --   --  2.5* 2.3  PHOS  --   --   --  3.1 3.9   Liver Function Tests: Recent Labs  Lab 11/06/19 0442 11/07/19 0331   ALBUMIN 3.3* 3.5   No results for input(s): LIPASE, AMYLASE in the last 168  hours. No results for input(s): AMMONIA in the last 168 hours. CBC: Recent Labs  Lab 11/04/19 1735 11/05/19 0453 11/06/19 0442 11/07/19 0331  WBC 4.2 3.5* 3.3* 3.8*  NEUTROABS  --   --  1.5* 2.0  HGB 15.0 14.0 13.4 14.1  HCT 46.7 44.2 43.5 45.2  MCV 97.7 98.2 99.8 101.6*  PLT 131* 129* 126* 133*   Cardiac Enzymes: No results for input(s): CKTOTAL, CKMB, CKMBINDEX, TROPONINI in the last 168 hours. BNP: Invalid input(s): POCBNP CBG: Recent Labs  Lab 11/05/19 2052 11/06/19 0730 11/06/19 1114 11/06/19 1631 11/06/19 2223  GLUCAP 133* 91 108* 88 102*   D-Dimer No results for input(s): DDIMER in the last 72 hours. Hgb A1c No results for input(s): HGBA1C in the last 72 hours. Lipid Profile No results for input(s): CHOL, HDL, LDLCALC, TRIG, CHOLHDL, LDLDIRECT in the last 72 hours. Thyroid function studies No results for input(s): TSH, T4TOTAL, T3FREE, THYROIDAB in the last 72 hours.  Invalid input(s): FREET3 Anemia work up No results for input(s): VITAMINB12, FOLATE, FERRITIN, TIBC, IRON, RETICCTPCT in the last 72 hours. Urinalysis    Component Value Date/Time   COLORURINE YELLOW 01/15/2016 1600   APPEARANCEUR CLEAR 01/15/2016 1600   LABSPEC 1.025 01/15/2016 1600   PHURINE 6.0 01/15/2016 1600   GLUCOSEU NEGATIVE 01/15/2016 1600   HGBUR NEGATIVE 01/15/2016 1600   BILIRUBINUR NEGATIVE 01/15/2016 1600   KETONESUR NEGATIVE 01/15/2016 1600   PROTEINUR NEGATIVE 07/21/2007 2108   UROBILINOGEN 0.2 01/15/2016 1600   NITRITE NEGATIVE 01/15/2016 1600   LEUKOCYTESUR NEGATIVE 01/15/2016 1600   Sepsis Labs Invalid input(s): PROCALCITONIN,  WBC,  LACTICIDVEN Microbiology Recent Results (from the past 240 hour(s))  SARS Coronavirus 2 by RT PCR (hospital order, performed in [facility] hospital lab) Nasopharyngeal Nasopharyngeal Swab     Status: None   Collection Time: 11/04/19  8:00 PM   Specimen:  Nasopharyngeal Swab  Result Value Ref Range Status   SARS Coronavirus 2 NEGATIVE NEGATIVE Final    Comment: (NOTE) SARS-CoV-2 target nucleic acids are NOT DETECTED.  The SARS-CoV-2 RNA is generally detectable in upper and lower respiratory specimens during the acute phase of infection. The lowest concentration of SARS-CoV-2 viral copies this assay can detect is 250 copies / mL. A negative result does not preclude SARS-CoV-2 infection and should not be used as the sole basis for treatment or other patient management decisions.  A negative result may occur with improper specimen collection / handling, submission of specimen other than nasopharyngeal swab, presence of viral mutation(s) within the areas targeted by this assay, and inadequate number of viral copies (<250 copies / mL). A negative result must be combined with clinical observations, patient history, and epidemiological information.  Fact Sheet for Patients:   StrictlyIdeas.no  Fact Sheet for Healthcare Providers: BankingDealers.co.za  This test is not yet approved or  cleared by the [place] FDA and has been authorized for detection and/or diagnosis of SARS-CoV-2 by FDA under an Emergency Use Authorization (EUA).  This EUA will remain in effect (meaning this test can be used) for the duration of the COVID-19 declaration under Section 564(b)(1) of the Act, 21 U.S.C. section 360bbb-3(b)(1), unless the authorization is terminated or revoked sooner.  Performed at [facility], [place] [address] [place], [place] [address]      Time coordinating discharge: 35 minutes  SIGNED:   Jonnie Finner, DO  [facility] 11/07/2019, 7:05 AM   If 7PM-7AM, please contact night-coverage [email or web address]

## 2019-11-06 NOTE — Discharge Instructions (Signed)
Heart Failure Action Plan A heart failure action plan helps you understand what to do when you have symptoms of heart failure. Follow the plan that was created by you and your health care provider. Review your plan each time you visit your health care provider. Red zone These signs and symptoms mean you should get medical help right away:  You have trouble breathing when resting.  You have a dry cough that is getting worse.  You have swelling or pain in your legs or abdomen that is getting worse.  You suddenly gain more than 2-3 lb (0.9-1.4 kg) in a day, or more than 5 lb (2.3 kg) in one week. This amount may be more or less depending on your condition.  You have trouble staying awake or you feel confused.  You have chest pain.  You do not have an appetite.  You pass out. If you experience any of these symptoms:  Call your local emergency services (911 in the [place] right away or seek help at the emergency department of the nearest hospital. Yellow zone These signs and symptoms mean your condition may be getting worse and you should make some changes:  You have trouble breathing when you are active or you need to sleep with extra pillows.  You have swelling in your legs or abdomen.  You gain 2-3 lb (0.9-1.4 kg) in one day, or 5 lb (2.3 kg) in one week. This amount may be more or less depending on your condition.  You get tired easily.  You have trouble sleeping.  You have a dry cough. If you experience any of these symptoms:  Contact your health care provider within the next day.  Your health care provider may adjust your medicines. Green zone These signs mean you are doing well and can continue what you are doing:  You do not have shortness of breath.  You have very little swelling or no new swelling.  Your weight is stable (no gain or loss).  You have a normal activity level.  You do not have chest pain or any other new symptoms. Follow these instructions at  home:  Take over-the-counter and prescription medicines only as told by your health care provider.  Weigh yourself daily. Your target weight is __________ lb (__________ kg). ? Call your health care provider if you gain more than __________ lb (__________ kg) in a day, or more than __________ lb (__________ kg) in one week.  Eat a heart-healthy diet. Work with a diet and nutrition specialist (dietitian) to create an eating plan that is best for you.  Keep all follow-up visits as told by your health care provider. This is important. Where to find more information  American Heart Association: www.heart.org Summary  Follow the action plan that was created by you and your health care provider.  Get help right away if you have any symptoms in the Red zone. This information is not intended to replace advice given to you by your health care provider. Make sure you discuss any questions you have with your health care provider. Document Revised: 03/13/2017 Document Reviewed: 05/10/2016 Elsevier Patient Education  2020 [employer].

## 2019-11-06 NOTE — Progress Notes (Signed)
RT spoke with patient about wearing CPAP and patient stated he didn't want to wear the CPAP at this time. RT explained to patient that he can call at anytime if he changes his mind about wearing the CPAP.  RT will continue to monitor.

## 2019-11-07 ENCOUNTER — Other Ambulatory Visit (HOSPITAL_COMMUNITY): Payer: Medicare Other

## 2019-11-07 ENCOUNTER — Ambulatory Visit (HOSPITAL_COMMUNITY): Payer: Medicare Other

## 2019-11-07 DIAGNOSIS — I5020 Unspecified systolic (congestive) heart failure: Secondary | ICD-10-CM

## 2019-11-07 LAB — RENAL FUNCTION PANEL
Albumin: 3.5 g/dL (ref 3.5–5.0)
Anion gap: 5 (ref 5–15)
BUN: 18 mg/dL (ref 6–20)
CO2: 36 mmol/L — ABNORMAL HIGH (ref 22–32)
Calcium: 8.7 mg/dL — ABNORMAL LOW (ref 8.9–10.3)
Chloride: 98 mmol/L (ref 98–111)
Creatinine, Ser: 1.08 mg/dL (ref 0.61–1.24)
GFR calc Af Amer: >60 mL/min (ref >60)
GFR calc non Af Amer: >60 mL/min (ref >60)
Glucose, Bld: 99 mg/dL (ref 70–99)
Phosphorus: 3.9 mg/dL (ref 2.5–4.6)
Potassium: 4.5 mmol/L (ref 3.5–5.1)
Sodium: 139 mmol/L (ref 135–145)

## 2019-11-07 LAB — CBC WITH DIFFERENTIAL/PLATELET
Abs Immature Granulocytes: 0 10*3/uL (ref 0.00–0.07)
Basophils Absolute: 0 10*3/uL (ref 0.0–0.1)
Basophils Relative: 0 %
Eosinophils Absolute: 0.1 10*3/uL (ref 0.0–0.5)
Eosinophils Relative: 2 %
HCT: 45.2 % (ref 39.0–52.0)
Hemoglobin: 14.1 g/dL (ref 13.0–17.0)
Immature Granulocytes: 0 %
Lymphocytes Relative: 31 %
Lymphs Abs: 1.2 10*3/uL (ref 0.7–4.0)
MCH: 31.7 pg (ref 26.0–34.0)
MCHC: 31.2 g/dL (ref 30.0–36.0)
MCV: 101.6 fL — ABNORMAL HIGH (ref 80.0–100.0)
Monocytes Absolute: 0.6 10*3/uL (ref 0.1–1.0)
Monocytes Relative: 14 %
Neutro Abs: 2 10*3/uL (ref 1.7–7.7)
Neutrophils Relative %: 53 %
Platelets: 133 10*3/uL — ABNORMAL LOW (ref 150–400)
RBC: 4.45 MIL/uL (ref 4.22–5.81)
RDW: 13.1 % (ref 11.5–15.5)
WBC: 3.8 10*3/uL — ABNORMAL LOW (ref 4.0–10.5)
nRBC: 0 % (ref 0.0–0.2)

## 2019-11-07 LAB — MAGNESIUM: Magnesium: 2.3 mg/dL (ref 1.7–2.4)

## 2019-11-07 LAB — GLUCOSE, CAPILLARY
Glucose-Capillary: 120 mg/dL — ABNORMAL HIGH (ref 70–99)
Glucose-Capillary: 112 mg/dL — ABNORMAL HIGH (ref 70–99)

## 2019-11-07 MED ORDER — LIVING BETTER WITH HEART FAILURE BOOK: Freq: Once | Status: DC

## 2019-11-07 MED ORDER — TORSEMIDE 20 MG PO TABS: 20.0000 mg | ORAL_TABLET | Freq: Two times a day (BID) | ORAL | 0 refills | Status: DC

## 2019-11-07 MED ORDER — SACUBITRIL-VALSARTAN 49-51 MG PO TABS: 1.0000 | ORAL_TABLET | Freq: Two times a day (BID) | ORAL | 0 refills | Status: AC

## 2019-11-07 MED FILL — ENTRESTO 49 MG-51 MG TABLET: 49-51 | 30 days supply | Qty: 60 | Fill #0

## 2019-11-07 NOTE — Care Management (Addendum)
Case Manager spoke with [facility] -[phone], concerning patient's need for a scale. She will deliver one to patient's room this morning.Bedside RN notified. No further needs identified. Ricki Miller, RN BSN Case Manager

## 2019-11-08 LAB — GLUCOSE, CAPILLARY: Glucose-Capillary: 139 mg/dL — ABNORMAL HIGH (ref 70–99)

## 2019-11-09 ENCOUNTER — Telehealth: Payer: Self-pay | Admitting: *Deleted

## 2019-11-09 NOTE — Telephone Encounter (Signed)
Pt was on TCM report tried calling pt to make hosp f/u w/Dr. Ronnald Ramp, but had to leave msg since no one answered. Per chart pt has not seen MD since " 2017". Not sure if he is seeing another PCP.Marland KitchenJohny Chess

## 2019-11-10 ENCOUNTER — Other Ambulatory Visit: Payer: Self-pay

## 2019-11-10 ENCOUNTER — Encounter (HOSPITAL_COMMUNITY): Payer: Self-pay | Admitting: Cardiology

## 2019-11-10 ENCOUNTER — Other Ambulatory Visit (HOSPITAL_COMMUNITY): Payer: Self-pay | Admitting: Cardiology

## 2019-11-10 ENCOUNTER — Ambulatory Visit (HOSPITAL_COMMUNITY)
Admission: RE | Admit: 2019-11-10 | Discharge: 2019-11-10 | Disposition: A | Discharge status: Home / Self Care | Payer: Medicare Other | Source: Ambulatory Visit | Attending: Cardiology | Admitting: Cardiology

## 2019-11-10 VITALS — BP 84/68 | HR 92 | Wt 235.6 lb

## 2019-11-10 DIAGNOSIS — Z79899 Other long term (current) drug therapy: Secondary | ICD-10-CM | POA: Diagnosis not present

## 2019-11-10 DIAGNOSIS — J849 Interstitial pulmonary disease, unspecified: Secondary | ICD-10-CM | POA: Insufficient documentation

## 2019-11-10 DIAGNOSIS — Z7951 Long term (current) use of inhaled steroids: Secondary | ICD-10-CM | POA: Insufficient documentation

## 2019-11-10 DIAGNOSIS — I5022 Chronic systolic (congestive) heart failure: Secondary | ICD-10-CM | POA: Diagnosis not present

## 2019-11-10 DIAGNOSIS — I110 Hypertensive heart disease with heart failure: Secondary | ICD-10-CM | POA: Diagnosis not present

## 2019-11-10 DIAGNOSIS — G4733 Obstructive sleep apnea (adult) (pediatric): Secondary | ICD-10-CM | POA: Insufficient documentation

## 2019-11-10 DIAGNOSIS — I428 Other cardiomyopathies: Secondary | ICD-10-CM | POA: Insufficient documentation

## 2019-11-10 DIAGNOSIS — I447 Left bundle-branch block, unspecified: Secondary | ICD-10-CM | POA: Diagnosis not present

## 2019-11-10 DIAGNOSIS — Z8774 Personal history of (corrected) congenital malformations of heart and circulatory system: Secondary | ICD-10-CM | POA: Diagnosis not present

## 2019-11-10 DIAGNOSIS — R011 Cardiac murmur, unspecified: Secondary | ICD-10-CM | POA: Insufficient documentation

## 2019-11-10 DIAGNOSIS — Z87891 Personal history of nicotine dependence: Secondary | ICD-10-CM | POA: Diagnosis not present

## 2019-11-10 DIAGNOSIS — E119 Type 2 diabetes mellitus without complications: Secondary | ICD-10-CM | POA: Insufficient documentation

## 2019-11-10 DIAGNOSIS — Z7982 Long term (current) use of aspirin: Secondary | ICD-10-CM | POA: Insufficient documentation

## 2019-11-10 DIAGNOSIS — Z833 Family history of diabetes mellitus: Secondary | ICD-10-CM | POA: Diagnosis not present

## 2019-11-10 DIAGNOSIS — Z95810 Presence of automatic (implantable) cardiac defibrillator: Secondary | ICD-10-CM | POA: Insufficient documentation

## 2019-11-10 LAB — BASIC METABOLIC PANEL
Anion gap: 10 (ref 5–15)
BUN: 25 mg/dL — ABNORMAL HIGH (ref 6–20)
CO2: 27 mmol/L (ref 22–32)
Calcium: 8.4 mg/dL — ABNORMAL LOW (ref 8.9–10.3)
Chloride: 100 mmol/L (ref 98–111)
Creatinine, Ser: 1.17 mg/dL (ref 0.61–1.24)
GFR calc Af Amer: >60 mL/min (ref >60)
GFR calc non Af Amer: >60 mL/min (ref >60)
Glucose, Bld: 138 mg/dL — ABNORMAL HIGH (ref 70–99)
Potassium: 4.4 mmol/L (ref 3.5–5.1)
Sodium: 137 mmol/L (ref 135–145)

## 2019-11-10 LAB — DIGOXIN LEVEL: Digoxin Level: 1 ng/mL (ref 0.8–2.0)

## 2019-11-10 MED ORDER — BIDIL 20-37.5 MG PO TABS: 1.0000 | ORAL_TABLET | Freq: Three times a day (TID) | ORAL | 2 refills | Status: DC

## 2019-11-10 MED FILL — BIDIL 20-37.5 MG TABS: 20-37.5 | 30 days supply | Qty: 90 | Fill #0

## 2019-11-10 NOTE — Progress Notes (Signed)
ID:  Kevin Cardenas, DOB 29-Dec-1975, MRN [id number]   Provider location: [facility] Type of Visit: Established patient  PCP:  [name], MD  Cardiologist:  [name]   History of Present Illness: Kevin Cardenas is a 44 y.o. male who has a history of VSD repair and removal of a pulmonary artery band at age 66, DM, HTN, systolic heart failure, NICM and OSA diagnosed in past but not on CPAP. Has St Jude ICD (placed 01/2015).   Echo in 3/20 showed EF 25%, mild LV dilation, mildly decreased RV systolic function.   CPX in 10/20 showed moderate functional limitation primarily due to restrictive lung disease.  I had planned to get PFTs and a high resolution CT chest to followup on lung restriction but was never scheduled.   At last appointment, he had been off his meds for several months but supposedly had restarted them.  Weight was up 20 lbs and he was volume overloaded.  I stopped Lasix and started torsemide, kept other meds as ordered.  He came to the ER later in 7/21 with a bleeding varicose vein but was noted to be hypotensive with AKI.  He was thought to be over-diuresed.  Torsemide was cut back and Entresto was decreased.   Patient returns for followup of CHF.  Weight is down 20 lbs.  He feels much better.  BP still low, 84/68 today.  He denies lightheadedness unless he squats down and stands up.  No falls.  Edema much decreased.  He is less short of breath, only gets dyspnea after walking long distances.  No orthopnea/PND. No problems climbing a flight of stairs.  No chest pain.   ECG (personally reviewed): NSR, LBBB (134 msec)  Labs (1/16): K 4.1, creatinine 0.9, TSH normal, LDL 89, HDL 46 Labs (05/24/2014): K 4.1 Creatinine 0.97  Labs (3/16): BNP 54, K 4.4, creatinine 1.0 Labs 08/14/2014: K 4.0 Creatinine 0.93  Labs (6/16): digoxin < 0.2 Labs (9/16): K 3.7, creatinine 0.93, digoxin < 0.5, HCT 40.4 Labs (01/2016): dig 0.5 K 4.1 Creatinine 0.94  Labs  (09/2017): K 4.3 Creatinine 0.95 Labs (3/20): K 4.3, creatinine 1.02, digoxin < 0.2, LDL 79 Labs (9/20): K 3.8, creatinine 0.82, digoxin < 0.2 Labs (7/21): K 3.8, creatinine 1.0 => 2.34 => 1.08, BNP 81, TSH normal  PMH: 1. VSD repair at 1.  2. Type 2 diabetes. 3. HTN 4. OSA: Not using CPAP.  5. Chronic systolic CHF: Nonischemic cardiomyopathy.  St Jude ICD.  - Echo 2009: EF 35-40% - LHC 2009: No angiographic coronary disease.  - Echo 9/11: LVEF 20-25%  - Echo 11/12: LVEF 30-35%, diffuse hypokinesis and mild LVH. RV systolic mildly reduced. LA mildly dilated. Pulmonic valve with mild stenosis.  - Echo 05/12/2014: EF 15-20% Grade IDD, LV moderately dilated.  RV normal. Mild pulmonc stenosis mean peak gradient 14.  - Echo 7/16: EF 25-30%, mild LV dilation, mild LVH, trivial pulmonic stenosis with peak gradient 11 mmHg, mildly decreased RV function with mild RV dilation.   - CPX (3/16): RER 1.03, peak VO2 19.9, VE/VCO2 slope 18, mild to moderately impaired functional capacity.  There was moderate to severe mixed restrictive/obstructive pattern on spirometry => exertional hypoxemia, oxygen saturation decreased to 84%.  - RHC (4/16): mean RA 9, PA 35/12, mean PCWP 8, CI 1.8 Fick, CI 2.4 thermo.  - Echo 02/2106: EF 30-35% Grade IIDD  - Echo 3/20: EF 25%, mild LV dilation, diffuse  hypokinesis, mildly decreased RV systolic function with mild RV dilation, no definite PS noted.  - CPX (10/20): VE/VCO2 16, peak VO2 15.8 (57% predicted), RER 1.08.  Moderate functional limitation primarily due to restrictive lung disease.  Severe restriction on PFTs.  6. Pulmonic stenosis: Mild in past, not significant on 2/20 echo.   Current Outpatient Medications  Medication Sig Dispense Refill  . albuterol (PROVENTIL) (2.5 MG/3ML) 0.083% nebulizer solution Take 3 mLs (2.5 mg total) by nebulization every 6 (six) hours as needed for wheezing or shortness of breath. 75 mL 11  . ascorbic acid (VITAMIN C) 500 MG tablet  Take 500-1,000 mg by mouth daily.    Marland Kitchen aspirin 81 MG chewable tablet Chew 1 tablet (81 mg total) by mouth daily. 30 tablet 0  . carvedilol (COREG) 25 MG tablet Take 25 mg by mouth 2 (two) times daily with a meal.    . digoxin (LANOXIN) 0.125 MG tablet Take 0.125 mg by mouth daily.    . fluticasone furoate-vilanterol (BREO ELLIPTA) 200-25 MCG/INH AEPB Inhale 1 puff into the lungs daily. (Patient taking differently: Inhale 1 puff into the lungs daily as needed (for seasonal flares). ) 30 each 11  . isosorbide-hydrALAZINE (BIDIL) 20-37.5 MG tablet Take 2 tablets by mouth 3 (three) times daily. 540 tablet 2  . potassium chloride SA (KLOR-CON) 20 MEQ tablet Take 1 tablet (20 mEq total) by mouth daily. 30 tablet 3  . sacubitril-valsartan (ENTRESTO) 49-51 MG Take 1 tablet by mouth 2 (two) times daily. 60 tablet 0  . simvastatin (ZOCOR) 20 MG tablet Take 1 tablet (20 mg total) by mouth at bedtime. Please call for office visit [phone] (Patient taking differently: Take 20 mg by mouth at bedtime. ) 30 tablet 4  . spironolactone (ALDACTONE) 25 MG tablet TAKE 1 TABLET (25 MG TOTAL) BY MOUTH DAILY. (Patient taking differently: Take 25 mg by mouth daily. ) 90 tablet 2  . torsemide (DEMADEX) 20 MG tablet Take 1 tablet (20 mg total) by mouth 2 (two) times daily. 60 tablet 0   No current facility-administered medications for this encounter.    Allergies:   Metolazone   Social History:  The patient  reports that he quit smoking about 8 years ago. His smoking use included cigarettes. He has a 4.50 pack-year smoking history. He has never used smokeless tobacco. He reports that he does not drink alcohol and does not use drugs.   Family History:  The patient's family history includes Diabetes in his maternal grandfather and paternal grandmother; Lung cancer in his father; Stroke in his father and mother.   ROS:  Please see the history of present illness.   All other systems are personally reviewed and negative.    Exam:   BP (!) 84/68   Pulse 92   Wt (!) 106.9 kg (235 lb 9.6 oz)   SpO2 95%   BMI 34.79 kg/m  General: NAD Neck: JVP 8 cm, no thyromegaly or thyroid nodule.  Lungs: Clear to auscultation bilaterally with normal respiratory effort. CV: Nondisplaced PMI.  Heart regular S1/S2, no S3/S4, 2/6 HSM LLSB.  1+ ankle edema (improved).  No carotid bruit.  Normal pedal pulses.  Abdomen: Soft, nontender, no hepatosplenomegaly, no distention.  Skin: Intact without lesions or rashes.  Neurologic: Alert and oriented x 3.  Psych: Normal affect. Extremities: No clubbing or cyanosis.  HEENT: Normal.   Recent Labs: 10/20/2019: BNP 80.5; TSH 2.030 11/07/2019: BUN 18; Creatinine, Ser 1.08; Hemoglobin 14.1; Magnesium 2.3; Platelets 133; Potassium 4.5;  Sodium 139  Personally reviewed   Wt Readings from Last 3 Encounters:  11/10/19 (!) 106.9 kg (235 lb 9.6 oz)  11/07/19 (!) 105.6 kg (232 lb 14.4 oz)  11/01/19 115.7 kg (255 lb)    ASSESSMENT AND PLAN:  1. Chronic systolic CHF: Nonischemic cardiomyopathy.  RHC in 4/16 showed preserved cardiac index by thermodilution but low by Fick.   Last Echo in 2/20 showed mildly dilated LV, EF 25%, mildly decreased RV systolic function, no definite pulmonic stenosis.  He has a [employer] officer, political party ICD (QRS not wide enough to be likely to benefit from CRT - 134 msec today).  Recent volume overload/active CHF, now better with 20 lb weight loss.  BP low however, suspect he was not compliant with current medical regimen in the past.  - Continue Coreg 25 mg bid and Entresto 49/51 bid.   - Continue spironolactone 25 mg daily.  - Continue digoxin 0.125 and check level.   - Decrease Bidil to 1 tab tid with low BP.    - Continue torsemide 20 mg bid.  BMET today.  - Continue KCL 20 mEq daily.  - Would like to start [place] eventually.  - I will arrange for repeat echo.  - I think he would benefit from Cardiomems monitoring to prevent repeat hospitalization.  Will see if we can arrange  this for him.  - I will set him up with paramedicine monitoring to help with medication compliance and follow his symptoms.  2. H/o VSD: No residual shunt noted 2/20 echo.  Last RHC also did not suggest any significant left to right shunting.  - Repeat echo as above.  3. Pulmonary stenosis: Very mild by 7/16 echo, no significant stenosis 2/20 echo.  Mild PS murmur on exam.  - Repeat echo as above.  4. OSA: Using CPAP.  5. Pulmonary: CPX in 10/20 concerning for interstitial lung disease. We ordered a workup with formal PFTs and high resolution CT chest, but this was never done.  - I will arrange for full PFTs and a high resolution CT chest w/o contrast now that he is fully diuresed.    He will see me back in about 2 wks.   Signed, [name], MD  11/10/2019  [facility] [address] [facility] [place] [address] [phone] (office) [phone] (fax)

## 2019-11-10 NOTE — Patient Instructions (Addendum)
DECREASE Bidil to 1 tab three times daily  Labs today We will only contact you if something comes back abnormal or we need to make some changes. Otherwise no news is good news!  Your physician has requested that you have  CT. Cardiac computed tomography (CT) is a painless test that uses an x-ray machine to take clear, detailed pictures. For further information please visit [facility]tn. Please follow instruction sheet as given.  Your physician has recommended that you have a pulmonary function test. Pulmonary Function Tests are a group of tests that measure how well air moves in and out of your lungs.  You have been referred to [facility] -they will be in contact with you to arrange a home visit  You have been referred for a [place] consists of a small pressure-sensing device that is implanted directly into your pulmonary artery. Once implanted, the sensor measures and transmits your blood flow pressure and heart rate.  Your physician has requested that you have an echocardiogram. Echocardiography is a painless test that uses sound waves to create images of your heart. It provides your doctor with information about the size and shape of your heart and how well your hearts chambers and valves are working. This procedure takes approximately one hour. There are no restrictions for this procedure.  Keep followup as scheduled with Dr Aundra Dubin  If you have any questions or concerns before your next appointment please send [place] a message through [facility] or call our office at [phone].    TO LEAVE A MESSAGE FOR THE NURSE SELECT OPTION 2, PLEASE LEAVE A MESSAGE INCLUDING:  YOUR NAME  DATE OF BIRTH  CALL BACK NUMBER  REASON FOR CALL**this is important as we prioritize the call backs  YOU WILL RECEIVE A CALL BACK THE SAME DAY AS LONG AS YOU CALL BEFORE 4:00 PM

## 2019-11-11 ENCOUNTER — Telehealth (HOSPITAL_COMMUNITY): Payer: Self-pay | Admitting: Licensed Clinical Social Worker

## 2019-11-11 ENCOUNTER — Telehealth: Payer: Self-pay

## 2019-11-11 NOTE — Telephone Encounter (Signed)
CSW called pt to discuss  MD recommendation for paramedicine referral.  Pt reports he was on paramedicine previously and found it helpful at that time but that he has no concerns currently- reports he takes his medications as prescribed and that he has no barriers to getting his medications.    Pt is not wanting to re-enroll with paramedicine currently but understands that if he has concerns he should reach out to the clinic for assistance.  Jorge Ny, LCSW Clinical Social Worker [facility] Desk#: [phone] Cell#: [phone]

## 2019-11-11 NOTE — Telephone Encounter (Signed)
New message    Last seen in  2017 wanted to know if Dr. Ronnald Ramp will accept him back as a new patient.    Please advise

## 2019-11-14 NOTE — Telephone Encounter (Signed)
LVM that Dr Ronnald Ramp has agreed to see him as a patient; pt advised if appt needed to call & make appt.

## 2019-11-18 ENCOUNTER — Other Ambulatory Visit: Payer: Self-pay

## 2019-11-18 ENCOUNTER — Ambulatory Visit (HOSPITAL_BASED_OUTPATIENT_CLINIC_OR_DEPARTMENT_OTHER)
Admission: RE | Admit: 2019-11-18 | Discharge: 2019-11-18 | Disposition: A | Discharge status: Home / Self Care | Payer: Medicare Other | Source: Ambulatory Visit | Attending: Internal Medicine | Admitting: Internal Medicine

## 2019-11-18 ENCOUNTER — Ambulatory Visit (HOSPITAL_COMMUNITY)
Admission: RE | Admit: 2019-11-18 | Discharge: 2019-11-18 | Disposition: A | Discharge status: Home / Self Care | Payer: Medicare Other | Source: Ambulatory Visit | Attending: Cardiology | Admitting: Cardiology

## 2019-11-18 ENCOUNTER — Encounter (HOSPITAL_COMMUNITY): Payer: Self-pay | Admitting: Cardiology

## 2019-11-18 VITALS — BP 80/50 | HR 85 | Ht 69.0 in | Wt 238.6 lb

## 2019-11-18 DIAGNOSIS — J984 Other disorders of lung: Secondary | ICD-10-CM | POA: Insufficient documentation

## 2019-11-18 DIAGNOSIS — I351 Nonrheumatic aortic (valve) insufficiency: Secondary | ICD-10-CM | POA: Diagnosis not present

## 2019-11-18 DIAGNOSIS — E119 Type 2 diabetes mellitus without complications: Secondary | ICD-10-CM | POA: Diagnosis not present

## 2019-11-18 DIAGNOSIS — I959 Hypotension, unspecified: Secondary | ICD-10-CM | POA: Insufficient documentation

## 2019-11-18 DIAGNOSIS — Z8774 Personal history of (corrected) congenital malformations of heart and circulatory system: Secondary | ICD-10-CM | POA: Diagnosis not present

## 2019-11-18 DIAGNOSIS — I5022 Chronic systolic (congestive) heart failure: Secondary | ICD-10-CM | POA: Insufficient documentation

## 2019-11-18 DIAGNOSIS — Z95810 Presence of automatic (implantable) cardiac defibrillator: Secondary | ICD-10-CM | POA: Insufficient documentation

## 2019-11-18 DIAGNOSIS — Z7901 Long term (current) use of anticoagulants: Secondary | ICD-10-CM | POA: Insufficient documentation

## 2019-11-18 DIAGNOSIS — G4733 Obstructive sleep apnea (adult) (pediatric): Secondary | ICD-10-CM | POA: Diagnosis not present

## 2019-11-18 DIAGNOSIS — Z7982 Long term (current) use of aspirin: Secondary | ICD-10-CM | POA: Diagnosis not present

## 2019-11-18 DIAGNOSIS — I428 Other cardiomyopathies: Secondary | ICD-10-CM | POA: Diagnosis not present

## 2019-11-18 DIAGNOSIS — Z79899 Other long term (current) drug therapy: Secondary | ICD-10-CM | POA: Diagnosis not present

## 2019-11-18 DIAGNOSIS — Z87891 Personal history of nicotine dependence: Secondary | ICD-10-CM | POA: Diagnosis not present

## 2019-11-18 DIAGNOSIS — I110 Hypertensive heart disease with heart failure: Secondary | ICD-10-CM | POA: Insufficient documentation

## 2019-11-18 LAB — BASIC METABOLIC PANEL
Anion gap: 11 (ref 5–15)
BUN: 19 mg/dL (ref 6–20)
CO2: 26 mmol/L (ref 22–32)
Calcium: 8.7 mg/dL — ABNORMAL LOW (ref 8.9–10.3)
Chloride: 101 mmol/L (ref 98–111)
Creatinine, Ser: 1.24 mg/dL (ref 0.61–1.24)
GFR calc Af Amer: >60 mL/min (ref >60)
GFR calc non Af Amer: >60 mL/min (ref >60)
Glucose, Bld: 109 mg/dL — ABNORMAL HIGH (ref 70–99)
Potassium: 4.7 mmol/L (ref 3.5–5.1)
Sodium: 138 mmol/L (ref 135–145)

## 2019-11-18 LAB — BRAIN NATRIURETIC PEPTIDE: B Natriuretic Peptide: 66.6 pg/mL (ref 0.0–100.0)

## 2019-11-18 LAB — ECHOCARDIOGRAM COMPLETE
Area-P 1/2: 3.89 cm2
Calc EF: 24.3 %
S' Lateral: 5.1 cm
Single Plane A2C EF: 27.5 %
Single Plane A4C EF: 27.4 %

## 2019-11-18 LAB — DIGOXIN LEVEL: Digoxin Level: 0.5 ng/mL — ABNORMAL LOW (ref 0.8–2.0)

## 2019-11-18 LAB — CBC
HCT: 44.1 % (ref 39.0–52.0)
Hemoglobin: 14.1 g/dL (ref 13.0–17.0)
MCH: 31.8 pg (ref 26.0–34.0)
MCHC: 32 g/dL (ref 30.0–36.0)
MCV: 99.3 fL (ref 80.0–100.0)
Platelets: 131 10*3/uL — ABNORMAL LOW (ref 150–400)
RBC: 4.44 MIL/uL (ref 4.22–5.81)
RDW: 13.2 % (ref 11.5–15.5)
WBC: 3.6 10*3/uL — ABNORMAL LOW (ref 4.0–10.5)
nRBC: 0 % (ref 0.0–0.2)

## 2019-11-18 MED ORDER — BIDIL 20-37.5 MG PO TABS: 0.5000 | ORAL_TABLET | Freq: Three times a day (TID) | ORAL | 2 refills | Status: DC

## 2019-11-18 NOTE — Progress Notes (Signed)
  Echocardiogram 2D Echocardiogram has been performed.  Jennette Dubin 11/18/2019, 10:12 AM

## 2019-11-18 NOTE — Patient Instructions (Signed)
Decrease Bidil to 1/2 tab Three times a day   Increase Torsemide to 40 mg (2 tabs) in AM and 20 mg (1 tab) in PM FOR 2 DAYS ONLY, then back to 20 mg Twice daily   Increase Potassium to 40 meq (2 tab) Daily FOR 2 DAYS ONLY, then back to 20 meq Daily  Labs done today, we will call you for abnormal results  Your physician recommends that you schedule a follow-up appointment in: 4-6 weeks  If you have any questions or concerns before your next appointment please send [place] a message through [facility] or call our office at [phone].    TO LEAVE A MESSAGE FOR THE NURSE SELECT OPTION 2, PLEASE LEAVE A MESSAGE INCLUDING: . YOUR NAME . DATE OF BIRTH . CALL BACK NUMBER . REASON FOR CALL**this is important as we prioritize the call backs  [place] AS LONG AS YOU CALL BEFORE 4:00 PM  At the [facility], you and your health needs are our priority. As part of our continuing mission to provide you with exceptional heart care, we have created designated Provider Care Teams. These Care Teams include your primary Cardiologist (physician) and Advanced Practice Providers (APPs- Physician Assistants and Nurse Practitioners) who all work together to provide you with the care you need, when you need it.   You may see any of the following providers on your designated Care Team at your next follow up: Marland Kitchen Dr Glori Bickers . Dr [name] . Darrick Grinder, NP . Lyda Jester, PA . Audry Riles, PharmD   Please be sure to bring in all your medications bottles to every appointment.

## 2019-11-18 NOTE — Progress Notes (Signed)
ID:  Kevin Cardenas, DOB [birth year], MRN [id number]   Provider location: [facility] Type of Visit: Established patient  PCP:  [name], MD  Cardiologist:  [name]   History of Present Illness: Kevin Cardenas is a 44 y.o. male who has a history of VSD repair and removal of a pulmonary artery band at age 28, DM, HTN, systolic heart failure, NICM and OSA diagnosed in past but not on CPAP. Has St Jude ICD (placed 01/2015).   Echo in 3/20 showed EF 25%, mild LV dilation, mildly decreased RV systolic function.   CPX in 10/20 showed moderate functional limitation primarily due to restrictive lung disease.  I had planned to get PFTs and a high resolution CT chest to followup on lung restriction but was never scheduled.   Had clinic f/u early 7/21 and had been off his meds for several months but supposedly had restarted them.  Weight was up 20 lbs and he was volume overloaded. We stopped his Lasix and started torsemide, kept other meds as ordered.  He came to the ER later in 7/21 with a bleeding varicose vein but was noted to be hypotensive with AKI.  He was thought to be over-diuresed.  Torsemide was cut back and Entresto was decreased.   Had return f/u w/ [name] 11/10/19.  Weight was down 20 lbs.  He felt much better but BP was still low at 84/68. No orthostatic symptoms.  No falls.  Only SOB walking long distances. No orthopnea/PND or CP. He was instructed to decrease Bidil to 1 tablet tid. He was also ordered to get PFTs and high resolution chest CT to r/o  interstitial lung disease as prior chest CT in 2020 was concerning for ILD. There was concern regarding medication compliance. He was referred to paramedicine but reports that he declined their services. Feels that he is able to manage his medications himself w/o needed assistance.   Today in f/u he reports that he continues to feel well but remains hypotensive. BP 80/50 but not symptomatic. Denies  dizziness/ lightheaded, no syncope/ near syncope. Gets around well w/o significant exertional dyspnea. NYHA Class II. He he has had gradual wt gain over the last several weeks. Wt up 3 lb since last visit, and 6 lb total since 7/26, from 232 lb>>238 lb today. Device interrogation shows Impedence has dropped below reference curve. No VT/VF. VP <1%   ECG: not performed  Labs (1/16): K 4.1, creatinine 0.9, TSH normal, LDL 89, HDL 46 Labs (05/24/2014): K 4.1 Creatinine 0.97  Labs (3/16): BNP 54, K 4.4, creatinine 1.0 Labs 08/14/2014: K 4.0 Creatinine 0.93  Labs (6/16): digoxin < 0.2 Labs (9/16): K 3.7, creatinine 0.93, digoxin < 0.5, HCT 40.4 Labs (01/2016): dig 0.5 K 4.1 Creatinine 0.94  Labs (09/2017): K 4.3 Creatinine 0.95 Labs (3/20): K 4.3, creatinine 1.02, digoxin < 0.2, LDL 79 Labs (9/20): K 3.8, creatinine 0.82, digoxin < 0.2 Labs (7/21): K 3.8, creatinine 1.0 => 2.34 => 1.08, BNP 81, TSH normal   PMH: 1. VSD repair at 2.  2. Type 2 diabetes. 3. HTN 4. OSA: Not using CPAP.  5. Chronic systolic CHF: Nonischemic cardiomyopathy.  St Jude ICD.  - Echo 2009: EF 35-40% - LHC 2009: No angiographic coronary disease.  - Echo 9/11: LVEF 20-25%  - Echo 11/12: LVEF 30-35%, diffuse hypokinesis and mild LVH. RV systolic mildly reduced. LA mildly dilated. Pulmonic valve with mild stenosis.  -  Echo 05/12/2014: EF 15-20% Grade IDD, LV moderately dilated.  RV normal. Mild pulmonc stenosis mean peak gradient 14.  - Echo 7/16: EF 25-30%, mild LV dilation, mild LVH, trivial pulmonic stenosis with peak gradient 11 mmHg, mildly decreased RV function with mild RV dilation.   - CPX (3/16): RER 1.03, peak VO2 19.9, VE/VCO2 slope 18, mild to moderately impaired functional capacity.  There was moderate to severe mixed restrictive/obstructive pattern on spirometry => exertional hypoxemia, oxygen saturation decreased to 84%.  - RHC (4/16): mean RA 9, PA 35/12, mean PCWP 8, CI 1.8 Fick, CI 2.4 thermo.  - Echo  02/2106: EF 30-35% Grade IIDD  - Echo 3/20: EF 25%, mild LV dilation, diffuse hypokinesis, mildly decreased RV systolic function with mild RV dilation, no definite PS noted.  - CPX (10/20): VE/VCO2 16, peak VO2 15.8 (57% predicted), RER 1.08.  Moderate functional limitation primarily due to restrictive lung disease.  Severe restriction on PFTs.  6. Pulmonic stenosis: Mild in past, not significant on 2/20 echo.   Current Outpatient Medications  Medication Sig Dispense Refill  . albuterol (PROVENTIL) (2.5 MG/3ML) 0.083% nebulizer solution Take 3 mLs (2.5 mg total) by nebulization every 6 (six) hours as needed for wheezing or shortness of breath. 75 mL 11  . ascorbic acid (VITAMIN C) 500 MG tablet Take 500-1,000 mg by mouth daily.    Marland Kitchen aspirin 81 MG chewable tablet Chew 1 tablet (81 mg total) by mouth daily. 30 tablet 0  . carvedilol (COREG) 25 MG tablet Take 25 mg by mouth 2 (two) times daily with a meal.    . digoxin (LANOXIN) 0.125 MG tablet Take 0.125 mg by mouth daily.    . fluticasone furoate-vilanterol (BREO ELLIPTA) 200-25 MCG/INH AEPB Inhale 1 puff into the lungs daily. 30 each 11  . isosorbide-hydrALAZINE (BIDIL) 20-37.5 MG tablet Take 0.5 tablets by mouth 3 (three) times daily. 90 tablet 2  . potassium chloride SA (KLOR-CON) 20 MEQ tablet Take 1 tablet (20 mEq total) by mouth daily. 30 tablet 3  . sacubitril-valsartan (ENTRESTO) 49-51 MG Take 1 tablet by mouth 2 (two) times daily. 60 tablet 0  . simvastatin (ZOCOR) 20 MG tablet Take 1 tablet (20 mg total) by mouth at bedtime. Please call for office visit [phone] 30 tablet 4  . spironolactone (ALDACTONE) 25 MG tablet TAKE 1 TABLET (25 MG TOTAL) BY MOUTH DAILY. 90 tablet 2  . torsemide (DEMADEX) 20 MG tablet Take 1 tablet (20 mg total) by mouth 2 (two) times daily. 60 tablet 0   No current facility-administered medications for this encounter.    Allergies:   Metolazone   Social History:  The patient  reports that he quit smoking  about 8 years ago. His smoking use included cigarettes. He has a 4.50 pack-year smoking history. He has never used smokeless tobacco. He reports that he does not drink alcohol and does not use drugs.   Family History:  The patient's family history includes Diabetes in his maternal grandfather and paternal grandmother; Lung cancer in his father; Stroke in his father and mother.   ROS:  Please see the history of present illness.   All other systems are personally reviewed and negative.   Exam:   BP (!) 80/50   Pulse 85   Ht _0  (1.753 m)   Wt 108.2 kg (238 lb 9.6 oz)   SpO2 93%   BMI 35.24 kg/m  PHYSICAL EXAM: General:  Well appearing, obese. No respiratory difficulty HEENT: normal Neck: supple.  JVD ~9 cm. Carotids 2+ bilat; no bruits. No lymphadenopathy or thyromegaly appreciated. Cor: PMI nondisplaced. Regular rate & rhythm. No rubs, gallops or murmurs. Lungs: clear Abdomen: obese/ mildly distended, soft, nontender. No hepatosplenomegaly. No bruits or masses. Good bowel sounds. Extremities: no cyanosis, clubbing, rash, edema Neuro: alert & oriented x 3, cranial nerves grossly intact. moves all 4 extremities w/o difficulty. Affect pleasant.   Recent Labs: 10/20/2019: TSH 2.030 11/07/2019: Magnesium 2.3 11/18/2019: B Natriuretic Peptide 66.6; BUN 19; Creatinine, Ser 1.24; Hemoglobin 14.1; Platelets 131; Potassium 4.7; Sodium 138  Personally reviewed   Wt Readings from Last 3 Encounters:  11/18/19 108.2 kg (238 lb 9.6 oz)  11/10/19 (!) 106.9 kg (235 lb 9.6 oz)  11/07/19 (!) 105.6 kg (232 lb 14.4 oz)    ASSESSMENT AND PLAN:  1. Chronic systolic CHF: Nonischemic cardiomyopathy.  RHC in 4/16 showed preserved cardiac index by thermodilution but low by Fick.   Last Echo in 2/20 showed mildly dilated LV, EF 25%, mildly decreased RV systolic function, no definite pulmonic stenosis.  He has a Research officer, political party ICD (QRS not wide enough to be likely to benefit from CRT - 134 msec today).  - mildly  volume overloaded on exam. Device interrogation also suggestive of fluid overload. Impedence has dropped below reference curve. No VT/VF. VP <1%. Wt has increased 6 lb in the last week. - Will reduce Bidil further to 1/2 tablet tid given hypotension and to allow increase in diuretic regimen.  - Increase torsemide to 40 qam/20 mg qpm x 2 days, then reduce back to 20 mg bid - Increase KCl to 40 mEq x 2 days, then reduce back down to 20 mEq daily - Continue Coreg 25 mg bid  - Continue Entresto 49/51 bid.    - Continue spironolactone 25 mg daily.  - Continue digoxin 0.125. Check dig level  - Decrease Bidil to 1/2 tab tid with low BP    - Would like to start [place] eventually. Not yet w/ low BP  - Repeat echo done today, interpretation pending  - I think he would benefit from Cardiomems monitoring to prevent repeat hospitalization.  Will see if we can arrange this for him.  - He was recently referred to paramedicine to help with medication compliance and follow his symptoms but he declined their services.  He feels that he is able to manage his medications himself w/o needed assistance.  2. H/o VSD: No residual shunt noted 2/20 echo.  Last RHC also did not suggest any significant left to right shunting.  - Repeat echo completed today, interpretation pending.  3. Pulmonary stenosis: Very mild by 7/16 echo, no significant stenosis 2/20 echo.  Mild PS murmur on exam.  - Repeat echo as above.  4. OSA: reports compliance w/ CPAP.  5. Pulmonary: CPX in 10/20 concerning for interstitial lung disease. We ordered a workup with formal PFTs and high resolution CT chest, but this was never done.  - he is scheduled for PFTs and a high resolution CT chest w/o contrast later this month. He is aware of appts and plans to keep   F/u w/ APP in 4 weeks. If BP remains low, may need to d/c bidil completely. He will notify our office if he develops symptoms.    Signed, Lyda Jester, PA-C  11/18/2019  [facility] [address] [facility] [place] [address] [phone] (office) [phone] (fax)

## 2019-11-24 ENCOUNTER — Other Ambulatory Visit: Payer: Self-pay

## 2019-11-24 ENCOUNTER — Ambulatory Visit (HOSPITAL_COMMUNITY)
Admission: RE | Admit: 2019-11-24 | Discharge: 2019-11-24 | Disposition: A | Discharge status: Home / Self Care | Payer: Medicare Other | Source: Ambulatory Visit | Attending: Cardiology | Admitting: Cardiology

## 2019-11-24 DIAGNOSIS — J849 Interstitial pulmonary disease, unspecified: Secondary | ICD-10-CM | POA: Insufficient documentation

## 2019-11-24 DIAGNOSIS — J984 Other disorders of lung: Secondary | ICD-10-CM | POA: Diagnosis not present

## 2019-11-25 ENCOUNTER — Other Ambulatory Visit (HOSPITAL_COMMUNITY): Payer: Medicare Other

## 2019-11-30 ENCOUNTER — Inpatient Hospital Stay (HOSPITAL_COMMUNITY): Admission: RE | Admit: 2019-11-30 | Payer: Medicare Other | Source: Ambulatory Visit

## 2019-12-05 ENCOUNTER — Other Ambulatory Visit (HOSPITAL_COMMUNITY): Payer: Medicare Other

## 2019-12-08 ENCOUNTER — Inpatient Hospital Stay (HOSPITAL_COMMUNITY): Admission: RE | Admit: 2019-12-08 | Payer: Medicare Other | Source: Ambulatory Visit

## 2019-12-09 LAB — HM DIABETES EYE EXAM

## 2019-12-14 ENCOUNTER — Other Ambulatory Visit: Payer: Self-pay | Admitting: Internal Medicine

## 2019-12-14 DIAGNOSIS — J4541 Moderate persistent asthma with (acute) exacerbation: Secondary | ICD-10-CM

## 2019-12-14 MED FILL — BIDIL 20-37.5 MG TABS: 20-37.5 | 30 days supply | Qty: 90 | Fill #0

## 2019-12-14 MED FILL — ENTRESTO 97 MG-103 MG TAB: 97-103 | 30 days supply | Qty: 60 | Fill #1

## 2019-12-14 MED FILL — SIMVASTATIN 20 MG TABLET: 20 | 30 days supply | Qty: 30 | Fill #1

## 2019-12-14 MED FILL — TORSEMIDE 20 MG TABLET: 20 | 30 days supply | Qty: 120 | Fill #1

## 2019-12-14 MED FILL — POTASSIUM CHLORIDE CRYS ER: 20 | 30 days supply | Qty: 30 | Fill #1

## 2019-12-29 ENCOUNTER — Ambulatory Visit (HOSPITAL_COMMUNITY)
Admission: RE | Admit: 2019-12-29 | Discharge: 2019-12-29 | Disposition: A | Discharge status: Home / Self Care | Payer: Medicare Other | Source: Ambulatory Visit | Attending: Cardiology | Admitting: Cardiology

## 2019-12-29 ENCOUNTER — Encounter (HOSPITAL_COMMUNITY): Payer: Self-pay

## 2019-12-29 ENCOUNTER — Other Ambulatory Visit: Payer: Self-pay

## 2019-12-29 VITALS — BP 122/68 | HR 68 | Ht 66.0 in | Wt 237.0 lb

## 2019-12-29 DIAGNOSIS — Z7982 Long term (current) use of aspirin: Secondary | ICD-10-CM | POA: Insufficient documentation

## 2019-12-29 DIAGNOSIS — Z79899 Other long term (current) drug therapy: Secondary | ICD-10-CM | POA: Diagnosis not present

## 2019-12-29 DIAGNOSIS — Z8774 Personal history of (corrected) congenital malformations of heart and circulatory system: Secondary | ICD-10-CM | POA: Insufficient documentation

## 2019-12-29 DIAGNOSIS — I110 Hypertensive heart disease with heart failure: Secondary | ICD-10-CM | POA: Insufficient documentation

## 2019-12-29 DIAGNOSIS — Z95810 Presence of automatic (implantable) cardiac defibrillator: Secondary | ICD-10-CM | POA: Insufficient documentation

## 2019-12-29 DIAGNOSIS — Q210 Ventricular septal defect: Secondary | ICD-10-CM | POA: Diagnosis not present

## 2019-12-29 DIAGNOSIS — Z7901 Long term (current) use of anticoagulants: Secondary | ICD-10-CM | POA: Diagnosis not present

## 2019-12-29 DIAGNOSIS — I428 Other cardiomyopathies: Secondary | ICD-10-CM | POA: Diagnosis not present

## 2019-12-29 DIAGNOSIS — Z87891 Personal history of nicotine dependence: Secondary | ICD-10-CM | POA: Insufficient documentation

## 2019-12-29 DIAGNOSIS — G4733 Obstructive sleep apnea (adult) (pediatric): Secondary | ICD-10-CM | POA: Diagnosis not present

## 2019-12-29 DIAGNOSIS — J984 Other disorders of lung: Secondary | ICD-10-CM | POA: Insufficient documentation

## 2019-12-29 DIAGNOSIS — I5022 Chronic systolic (congestive) heart failure: Secondary | ICD-10-CM | POA: Diagnosis not present

## 2019-12-29 DIAGNOSIS — E119 Type 2 diabetes mellitus without complications: Secondary | ICD-10-CM | POA: Insufficient documentation

## 2019-12-29 LAB — BASIC METABOLIC PANEL
Anion gap: 11 (ref 5–15)
BUN: 13 mg/dL (ref 6–20)
CO2: 29 mmol/L (ref 22–32)
Calcium: 8.9 mg/dL (ref 8.9–10.3)
Chloride: 99 mmol/L (ref 98–111)
Creatinine, Ser: 1.12 mg/dL (ref 0.61–1.24)
GFR calc Af Amer: >60 mL/min (ref >60)
GFR calc non Af Amer: >60 mL/min (ref >60)
Glucose, Bld: 108 mg/dL — ABNORMAL HIGH (ref 70–99)
Potassium: 4.3 mmol/L (ref 3.5–5.1)
Sodium: 139 mmol/L (ref 135–145)

## 2019-12-29 MED ORDER — ENTRESTO 97-103 MG PO TABS: 1.0000 | ORAL_TABLET | Freq: Two times a day (BID) | ORAL | 11 refills | Status: DC

## 2019-12-29 NOTE — Patient Instructions (Signed)
Labs today We will only contact you if something comes back abnormal or we need to make some changes. Otherwise no news is good news!  Your physician recommends that you schedule a follow-up appointment in: 3-4 months with Dr Aundra Dubin  If you have any questions or concerns before your next appointment please send [place] a message through [facility] or call our office at [phone].    TO LEAVE A MESSAGE FOR THE NURSE SELECT OPTION 2, PLEASE LEAVE A MESSAGE INCLUDING: . YOUR NAME . DATE OF BIRTH . CALL BACK NUMBER . REASON FOR CALL**this is important as we prioritize the call backs  YOU WILL RECEIVE A CALL BACK THE SAME DAY AS LONG AS YOU CALL BEFORE 4:00 PM

## 2019-12-29 NOTE — Progress Notes (Signed)
ID:  Kevin Cardenas, DOB Aug 17, 1975, MRN [id number]   Provider location:  [facility] Type of Visit: Established patient  PCP:  [name], MD  Cardiologist:  [name]   History of Present Illness: Kevin Cardenas is a 44 y.o. male who has a history of VSD repair and removal of a pulmonary artery band at age 23, DM, HTN, systolic heart failure, NICM and OSA diagnosed in past but not on CPAP. Has St Jude ICD (placed 01/2015).   Echo in 3/20 showed EF 25%, mild LV dilation, mildly decreased RV systolic function.   CPX in 10/20 showed moderate functional limitation primarily due to restrictive lung disease.  I had planned to get PFTs and a high resolution CT chest to followup on lung restriction but was never scheduled.   Had clinic f/u early 7/21 and had been off his meds for several months but supposedly had restarted them.  Weight was up 20 lbs and he was volume overloaded. We stopped his Lasix and started torsemide, kept other meds as ordered.  He came to the ER later in 7/21 with a bleeding varicose vein but was noted to be hypotensive with AKI.  He was thought to be over-diuresed.  Torsemide was cut back and Entresto was decreased.   Had return f/u w/ [name] 11/10/19.  Weight was down 20 lbs.  He felt much better but BP was still low at 84/68. No orthostatic symptoms.  No falls.  Only SOB walking long distances. No orthopnea/PND or CP. He was instructed to decrease Bidil to 1 tablet tid. He was also ordered to get PFTs and high resolution chest CT to r/o  interstitial lung disease as prior chest CT in 2020 was concerning for ILD. There was concern regarding medication compliance. He was referred to paramedicine but reports that he declined their services. Feels that he is able to manage his medications himself w/o needed assistance.   I saw him back for f/u on 8/6.  He felt well but remained hypotensive. BP was 80/50 but not symptomatic. He denied  dizziness/ lightheaded, no syncope/ near syncope. He he had had gradual wt gain, Wt was up 6 lb. Device interrogation showed Impedence had dropped below reference curve. No VT/VF. VP <1%. Bidil was further decreased to 1/2 tablet tid given hypotension and to allow increase in diuretic regimen. I increased his torsemide to 40 qam/20 mg qpm x 2 days, then reduced back to 20 mg bid therafter. He also had repeat echo on 8/6 that showed EF 30-35% w/ intact VSD repair. High resolution CT was done 8/13 and showed no evidence for ILD.   He returns to clinic today for f/u. Here w/ his wife. BP much improved, 546T systolic. Monitors BP at home, no further hypotension. Fluid status improved. Impedance level normal on CorVue. He is NYHA Class III. Denies resting dyspnea. Reports full med compliance.     ECG: not performed   Labs (1/16): K 4.1, creatinine 0.9, TSH normal, LDL 89, HDL 46 Labs (05/24/2014): K 4.1 Creatinine 0.97  Labs (3/16): BNP 54, K 4.4, creatinine 1.0 Labs 08/14/2014: K 4.0 Creatinine 0.93  Labs (6/16): digoxin < 0.2 Labs (9/16): K 3.7, creatinine 0.93, digoxin < 0.5, HCT 40.4 Labs (01/2016): dig 0.5 K 4.1 Creatinine 0.94  Labs (09/2017): K 4.3 Creatinine 0.95 Labs (3/20): K 4.3, creatinine 1.02, digoxin < 0.2, LDL 79 Labs (9/20): K 3.8, creatinine 0.82, digoxin < 0.2 Labs (  7/21): K 3.8, creatinine 1.0 => 2.34 => 1.08, BNP 81, TSH normal Labs (8/21): K 4.7, creatinine 1.24, BNP 66, dig level 0.5    PMH: 1. VSD repair at 2.  2. Type 2 diabetes. 3. HTN 4. OSA: Not using CPAP.  5. Chronic systolic CHF: Nonischemic cardiomyopathy.  St Jude ICD.  - Echo 2009: EF 35-40% - LHC 2009: No angiographic coronary disease.  - Echo 9/11: LVEF 20-25%  - Echo 11/12: LVEF 30-35%, diffuse hypokinesis and mild LVH. RV systolic mildly reduced. LA mildly dilated. Pulmonic valve with mild stenosis.  - Echo 05/12/2014: EF 15-20% Grade IDD, LV moderately dilated.  RV normal. Mild pulmonc stenosis mean peak  gradient 14.  - Echo 7/16: EF 25-30%, mild LV dilation, mild LVH, trivial pulmonic stenosis with peak gradient 11 mmHg, mildly decreased RV function with mild RV dilation.   - CPX (3/16): RER 1.03, peak VO2 19.9, VE/VCO2 slope 18, mild to moderately impaired functional capacity.  There was moderate to severe mixed restrictive/obstructive pattern on spirometry => exertional hypoxemia, oxygen saturation decreased to 84%.  - RHC (4/16): mean RA 9, PA 35/12, mean PCWP 8, CI 1.8 Fick, CI 2.4 thermo.  - Echo 02/2106: EF 30-35% Grade IIDD  - Echo 3/20: EF 25%, mild LV dilation, diffuse hypokinesis, mildly decreased RV systolic function with mild RV dilation, no definite PS noted.  - CPX (10/20): VE/VCO2 16, peak VO2 15.8 (57% predicted), RER 1.08.  Moderate functional limitation primarily due to restrictive lung disease.  Severe restriction on PFTs.  6. Pulmonic stenosis: Mild in past, not significant on 2/20 echo.   Current Outpatient Medications  Medication Sig Dispense Refill   ascorbic acid (VITAMIN C) 500 MG tablet Take 500-1,000 mg by mouth daily.     aspirin 81 MG chewable tablet Chew 1 tablet (81 mg total) by mouth daily. 30 tablet 0   carvedilol (COREG) 25 MG tablet Take 25 mg by mouth 2 (two) times daily with a meal.     digoxin (LANOXIN) 0.125 MG tablet Take 0.125 mg by mouth daily.     ENTRESTO 97-103 MG Take 1 tablet by mouth 2 (two) times daily.     fluticasone furoate-vilanterol (BREO ELLIPTA) 200-25 MCG/INH AEPB Inhale 1 puff into the lungs daily. 30 each 11   isosorbide-hydrALAZINE (BIDIL) 20-37.5 MG tablet Take 0.5 tablets by mouth 3 (three) times daily. 90 tablet 2   potassium chloride SA (KLOR-CON) 20 MEQ tablet Take 1 tablet (20 mEq total) by mouth daily. 30 tablet 3   simvastatin (ZOCOR) 20 MG tablet Take 1 tablet (20 mg total) by mouth at bedtime. Please call for office visit [phone] 30 tablet 4   spironolactone (ALDACTONE) 25 MG tablet TAKE 1 TABLET (25 MG TOTAL)  BY MOUTH DAILY. 90 tablet 2   torsemide (DEMADEX) 20 MG tablet Take 1 tablet (20 mg total) by mouth 2 (two) times daily. 60 tablet 0   albuterol (PROVENTIL) (2.5 MG/3ML) 0.083% nebulizer solution Take 3 mLs (2.5 mg total) by nebulization every 6 (six) hours as needed for wheezing or shortness of breath. (Patient not taking: Reported on 12/29/2019) 75 mL 11   No current facility-administered medications for this encounter.    Allergies:   Metolazone   Social History:  The patient  reports that he quit smoking about 8 years ago. His smoking use included cigarettes. He has a 4.50 pack-year smoking history. He has never used smokeless tobacco. He reports that he does not drink alcohol and does not  use drugs.   Family History:  The patient's family history includes Diabetes in his maternal grandfather and paternal grandmother; Lung cancer in his father; Stroke in his father and mother.   ROS:  Please see the history of present illness.   All other systems are personally reviewed and negative.   Exam:   BP 122/68 (BP Location: Left Arm, Patient Position: Sitting, Cuff Size: Normal)    Pulse 68    Ht 5' 6" (1.676 m)    Wt 107.5 kg (237 lb)    SpO2 94%    BMI 38.25 kg/m   PHYSICAL EXAM: General:  Well appearing, moderately obese, No respiratory difficulty HEENT: normal Neck: supple. no JVD. Carotids 2+ bilat; no bruits. No lymphadenopathy or thyromegaly appreciated. Cor: PMI nondisplaced. Regular rate & rhythm. No rubs, gallops or murmurs. Lungs: clear Abdomen: obese, soft, nontender, nondistended. No hepatosplenomegaly. No bruits or masses. Good bowel sounds. Extremities: no cyanosis, clubbing, rash, edema Neuro: alert & oriented x 3, cranial nerves grossly intact. moves all 4 extremities w/o difficulty. Affect pleasant.    Recent Labs: 10/20/2019: TSH 2.030 11/07/2019: Magnesium 2.3 11/18/2019: B Natriuretic Peptide 66.6; BUN 19; Creatinine, Ser 1.24; Hemoglobin 14.1; Platelets 131; Potassium  4.7; Sodium 138  Personally reviewed   Wt Readings from Last 3 Encounters:  12/29/19 107.5 kg (237 lb)  11/18/19 108.2 kg (238 lb 9.6 oz)  11/10/19 (!) 106.9 kg (235 lb 9.6 oz)    ASSESSMENT AND PLAN:  1. Chronic systolic CHF: Nonischemic cardiomyopathy.  RHC in 4/16 showed preserved cardiac index by thermodilution but low by Fick.  Echo in 2/20 showed mildly dilated LV, EF 25%, mildly decreased RV systolic function, no definite pulmonic stenosis.  He has a Research officer, political party ICD (QRS not wide enough to be likely to benefit from CRT - 134 msec today). Most recent echo 8/21 showed EF 30-35%, VSD closure stable  - NYHA Class III - Euvolic on exam and by Optivol  - Continue torsemide 20 mg bid  - Continue Bidil 1/2 tablet tid (did not tolerate higher dosage due to hypotension)  - Continue Coreg 25 mg bid  - Continue Entresto 49/51 bid.    - Continue spironolactone 25 mg daily.  - Continue digoxin 0.125. Check dig level  - Check BMP today. If labs stable, plan to start Farxiga - I think he would benefit from Cardiomems monitoring to prevent repeat hospitalization.  Will see if we can arrange this for him.  - He was recently referred to paramedicine to help with medication compliance and follow his symptoms but he declined their services.  He feels that he is able to manage his medications himself w/o needed assistance.  2. H/o VSD: No residual shunt noted 2/20 echo.  Last RHC also did not suggest any significant left to right shunting.  - Echo 8/21 showed stable repair  3. Pulmonary stenosis: Very mild by 7/16 echo, no significant stenosis 2/20 echo. Pulmonic valve not well visualized on recent echo 8/21  4. OSA: reports compliance w/ CPAP.  5. Pulmonary: CPX in 10/20 concerning for interstitial lung disease. However high resolution chest CT 8/21 showed no ILD.   F/u w/ pharmD 2-3 weeks after initiation of Farxiga. F/u w/ [name] in 3 months   Signed, Kevin Jester, PA-C   12/29/2019  [facility] [place] [address] [phone] (office) [phone] (fax)

## 2019-12-30 ENCOUNTER — Other Ambulatory Visit (HOSPITAL_COMMUNITY): Payer: Self-pay | Admitting: Cardiology

## 2019-12-30 ENCOUNTER — Telehealth (HOSPITAL_COMMUNITY): Payer: Self-pay | Admitting: Cardiology

## 2019-12-30 DIAGNOSIS — I5020 Unspecified systolic (congestive) heart failure: Secondary | ICD-10-CM

## 2019-12-30 LAB — MISC LABCORP TEST (SEND OUT): Labcorp test code: 7385

## 2019-12-30 LAB — DIGOXIN LEVEL

## 2019-12-30 MED ORDER — DAPAGLIFLOZIN PROPANEDIOL 10 MG PO TABS
10.0000 mg | ORAL_TABLET | Freq: Every day | ORAL | 11 refills | Status: DC
Start: 2019-12-30 — End: 2019-12-30

## 2019-12-30 MED FILL — FARXIGA 10 MG TABLET: 10 | 30 days supply | Qty: 30 | Fill #0

## 2019-12-30 NOTE — Telephone Encounter (Signed)
-----  Message from Consuelo Pandy, [place] sent at 12/29/2019  2:01 PM EDT ----- Labs are normal. Recommend starting Farxiga 10 mg daily to help w/ HF. Repeat BMP again in 1 week. F/u pharmD or APP in 4-6 weeks.

## 2019-12-30 NOTE — Telephone Encounter (Signed)
Pt aware.

## 2020-01-06 ENCOUNTER — Other Ambulatory Visit (HOSPITAL_COMMUNITY): Payer: Medicare Other

## 2020-01-06 MED FILL — ENTRESTO 97 MG-103 MG TAB: 97-103 | 30 days supply | Qty: 60 | Fill #0

## 2020-01-06 NOTE — Addendum Note (Signed)
Encounter addended by: Harvie Junior, CMA on: 01/06/2020 10:36 AM  Actions taken: Charge Capture section accepted

## 2020-01-18 ENCOUNTER — Ambulatory Visit (INDEPENDENT_AMBULATORY_CARE_PROVIDER_SITE_OTHER): Payer: Medicare Other

## 2020-01-18 DIAGNOSIS — I5022 Chronic systolic (congestive) heart failure: Secondary | ICD-10-CM

## 2020-01-18 DIAGNOSIS — I428 Other cardiomyopathies: Secondary | ICD-10-CM

## 2020-01-20 ENCOUNTER — Telehealth (HOSPITAL_COMMUNITY): Payer: Self-pay | Admitting: Cardiology

## 2020-01-20 NOTE — Telephone Encounter (Signed)
Patient approved for cardio mems auth valid in 02/02/20  Patient declines-felt he does not need device Advised would forward to provider as fyi and if provider would like to discuss further he would call otherwise keep follow up as scheduled

## 2020-01-21 LAB — CUP PACEART REMOTE DEVICE CHECK
Battery Remaining Longevity: 59 mo
Battery Remaining Percentage: 56 %
Battery Voltage: 2.96 V
Brady Statistic RV Percent Paced: 1 %
Date Time Interrogation Session: 20211008140645
HighPow Impedance: 69 Ohm
HighPow Impedance: 69 Ohm
Implantable Lead Implant Date: 20161007
Implantable Lead Location: 753860
Implantable Lead Model: 181
Implantable Lead Serial Number: 331179
Implantable Pulse Generator Implant Date: 20161007
Lead Channel Impedance Value: 380 Ohm
Lead Channel Pacing Threshold Amplitude: 0.75 V
Lead Channel Pacing Threshold Pulse Width: 0.5 ms
Lead Channel Sensing Intrinsic Amplitude: 9.1 mV
Lead Channel Setting Pacing Amplitude: 2.5 V
Lead Channel Setting Pacing Pulse Width: 0.5 ms
Lead Channel Setting Sensing Sensitivity: 0.5 mV
Pulse Gen Serial Number: 7286706

## 2020-01-23 NOTE — Progress Notes (Signed)
Remote ICD transmission.   

## 2020-02-03 MED FILL — POTASSIUM CHLORIDE CRYS ER: 20 | 30 days supply | Qty: 30 | Fill #2

## 2020-02-03 MED FILL — SPIRONOLACTONE 25 MG TABS: 25 | 90 days supply | Qty: 90 | Fill #1

## 2020-02-03 MED FILL — DIGOXIN 0.125 MG TABLET: 125 | 90 days supply | Qty: 90 | Fill #1

## 2020-02-03 MED FILL — CARVEDILOL 25 MG TABLET: 25 | 90 days supply | Qty: 180 | Fill #1

## 2020-02-03 MED FILL — SIMVASTATIN 20 MG TABLET: 20 | 30 days supply | Qty: 30 | Fill #2

## 2020-02-03 MED FILL — TORSEMIDE 20 MG TABLET: 20 | 30 days supply | Qty: 120 | Fill #2

## 2020-02-03 MED FILL — FARXIGA 10 MG TABLET: 10 | 30 days supply | Qty: 30 | Fill #1

## 2020-02-03 MED FILL — ENTRESTO 97 MG-103 MG TAB: 97-103 | 30 days supply | Qty: 60 | Fill #2

## 2020-03-28 MED FILL — ENTRESTO 97 MG-103 MG TAB: 97-103 | 30 days supply | Qty: 60 | Fill #3

## 2020-03-28 MED FILL — SIMVASTATIN 20 MG TABLET: 20 | 30 days supply | Qty: 30 | Fill #3

## 2020-03-28 MED FILL — POTASSIUM CHLORIDE CRYS ER: 20 | 30 days supply | Qty: 30 | Fill #3

## 2020-03-28 MED FILL — TORSEMIDE 20 MG TABLET: 20 | 30 days supply | Qty: 120 | Fill #3

## 2020-03-28 MED FILL — FARXIGA 10 MG TABLET: 10 | 30 days supply | Qty: 30 | Fill #2

## 2020-04-09 ENCOUNTER — Encounter (HOSPITAL_COMMUNITY): Payer: Medicare Other | Admitting: Cardiology

## 2020-04-18 ENCOUNTER — Telehealth: Payer: Self-pay | Admitting: Emergency Medicine

## 2020-04-18 ENCOUNTER — Ambulatory Visit (INDEPENDENT_AMBULATORY_CARE_PROVIDER_SITE_OTHER): Payer: Medicare Other

## 2020-04-18 DIAGNOSIS — I428 Other cardiomyopathies: Secondary | ICD-10-CM

## 2020-04-18 LAB — CUP PACEART REMOTE DEVICE CHECK
Battery Remaining Longevity: 56 mo
Battery Remaining Percentage: 54 %
Battery Voltage: 2.96 V
Brady Statistic RV Percent Paced: 1 %
Date Time Interrogation Session: 20220105092054
HighPow Impedance: 79 Ohm
HighPow Impedance: 79 Ohm
Implantable Lead Implant Date: 20161007
Implantable Lead Location: 753860
Implantable Lead Model: 181
Implantable Lead Serial Number: 331179
Implantable Pulse Generator Implant Date: 20161007
Lead Channel Impedance Value: 430 Ohm
Lead Channel Pacing Threshold Amplitude: 0.75 V
Lead Channel Pacing Threshold Pulse Width: 0.5 ms
Lead Channel Sensing Intrinsic Amplitude: 11.4 mV
Lead Channel Setting Pacing Amplitude: 2.5 V
Lead Channel Setting Pacing Pulse Width: 0.5 ms
Lead Channel Setting Sensing Sensitivity: 0.5 mV
Pulse Gen Serial Number: 7286706

## 2020-04-18 NOTE — Telephone Encounter (Signed)
Alert for Noise reversion on single chamber ICD. Patient reports he went through a metal detector around the time of the event. Reviewed safety precautions concerning metal detectors, magnets, ETC.

## 2020-05-03 NOTE — Progress Notes (Signed)
Remote ICD transmission.

## 2020-05-30 ENCOUNTER — Other Ambulatory Visit: Payer: Self-pay

## 2020-06-05 ENCOUNTER — Other Ambulatory Visit (HOSPITAL_COMMUNITY): Payer: Self-pay | Admitting: Cardiology

## 2020-06-05 MED FILL — SPIRONOLACTONE 25 MG TABS: 25 | 90 days supply | Qty: 90 | Fill #2

## 2020-06-05 MED FILL — CARVEDILOL 25 MG TABLET: 25 | 90 days supply | Qty: 180 | Fill #2

## 2020-06-05 MED FILL — POTASSIUM CHLORIDE CRYS ER: 20 | 60 days supply | Qty: 60 | Fill #0

## 2020-06-05 MED FILL — TORSEMIDE 20 MG TABLET: 20 | 60 days supply | Qty: 120 | Fill #0

## 2020-06-05 MED FILL — BIDIL 20-37.5 MG TABS: 20-37.5 | 30 days supply | Qty: 90 | Fill #1

## 2020-06-05 MED FILL — FARXIGA 10 MG TABLET: 10 | 30 days supply | Qty: 30 | Fill #3

## 2020-06-05 MED FILL — SIMVASTATIN 20 MG TABLET: 20 | 30 days supply | Qty: 30 | Fill #4

## 2020-06-05 MED FILL — DIGOXIN 0.125 MG TABLET: 125 | 90 days supply | Qty: 90 | Fill #2

## 2020-06-05 MED FILL — ENTRESTO 97 MG-103 MG TAB: 97-103 | 30 days supply | Qty: 60 | Fill #4

## 2020-07-02 ENCOUNTER — Telehealth (HOSPITAL_COMMUNITY): Payer: Self-pay | Admitting: *Deleted

## 2020-07-02 NOTE — Telephone Encounter (Signed)
Pt called to confirm remote device check appt and appt with Dr.McLean. pt aware of both appts.

## 2020-07-14 ENCOUNTER — Other Ambulatory Visit (HOSPITAL_COMMUNITY): Payer: Self-pay

## 2020-07-18 ENCOUNTER — Ambulatory Visit (INDEPENDENT_AMBULATORY_CARE_PROVIDER_SITE_OTHER): Payer: Medicare Other

## 2020-07-18 DIAGNOSIS — I428 Other cardiomyopathies: Secondary | ICD-10-CM

## 2020-07-19 LAB — CUP PACEART REMOTE DEVICE CHECK
Battery Remaining Longevity: 55 mo
Battery Remaining Percentage: 52 %
Battery Voltage: 2.96 V
Brady Statistic RV Percent Paced: 1 %
Date Time Interrogation Session: 20220406094150
HighPow Impedance: 78 Ohm
HighPow Impedance: 78 Ohm
Implantable Lead Implant Date: 20161007
Implantable Lead Location: 753860
Implantable Lead Model: 181
Implantable Lead Serial Number: 331179
Implantable Pulse Generator Implant Date: 20161007
Lead Channel Impedance Value: 400 Ohm
Lead Channel Pacing Threshold Amplitude: 0.75 V
Lead Channel Pacing Threshold Pulse Width: 0.5 ms
Lead Channel Sensing Intrinsic Amplitude: 12 mV
Lead Channel Setting Pacing Amplitude: 2.5 V
Lead Channel Setting Pacing Pulse Width: 0.5 ms
Lead Channel Setting Sensing Sensitivity: 0.5 mV
Pulse Gen Serial Number: 7286706

## 2020-07-31 NOTE — Progress Notes (Signed)
Remote ICD transmission.

## 2020-08-16 ENCOUNTER — Other Ambulatory Visit: Payer: Self-pay

## 2020-08-16 ENCOUNTER — Encounter (HOSPITAL_COMMUNITY): Payer: Self-pay | Admitting: Cardiology

## 2020-08-16 ENCOUNTER — Ambulatory Visit (HOSPITAL_COMMUNITY)
Admission: RE | Admit: 2020-08-16 | Discharge: 2020-08-16 | Disposition: A | Discharge status: Home / Self Care | Payer: Medicare Other | Source: Ambulatory Visit | Attending: Cardiology | Admitting: Cardiology

## 2020-08-16 VITALS — BP 100/60 | HR 85 | Wt 240.8 lb

## 2020-08-16 DIAGNOSIS — I428 Other cardiomyopathies: Secondary | ICD-10-CM | POA: Insufficient documentation

## 2020-08-16 DIAGNOSIS — Z8774 Personal history of (corrected) congenital malformations of heart and circulatory system: Secondary | ICD-10-CM | POA: Diagnosis not present

## 2020-08-16 DIAGNOSIS — Z7982 Long term (current) use of aspirin: Secondary | ICD-10-CM | POA: Insufficient documentation

## 2020-08-16 DIAGNOSIS — Z7984 Long term (current) use of oral hypoglycemic drugs: Secondary | ICD-10-CM | POA: Insufficient documentation

## 2020-08-16 DIAGNOSIS — Z833 Family history of diabetes mellitus: Secondary | ICD-10-CM | POA: Insufficient documentation

## 2020-08-16 DIAGNOSIS — Z95810 Presence of automatic (implantable) cardiac defibrillator: Secondary | ICD-10-CM | POA: Insufficient documentation

## 2020-08-16 DIAGNOSIS — Z87891 Personal history of nicotine dependence: Secondary | ICD-10-CM | POA: Diagnosis not present

## 2020-08-16 DIAGNOSIS — E119 Type 2 diabetes mellitus without complications: Secondary | ICD-10-CM | POA: Insufficient documentation

## 2020-08-16 DIAGNOSIS — Z79899 Other long term (current) drug therapy: Secondary | ICD-10-CM | POA: Insufficient documentation

## 2020-08-16 DIAGNOSIS — I110 Hypertensive heart disease with heart failure: Secondary | ICD-10-CM | POA: Insufficient documentation

## 2020-08-16 DIAGNOSIS — G4733 Obstructive sleep apnea (adult) (pediatric): Secondary | ICD-10-CM | POA: Insufficient documentation

## 2020-08-16 DIAGNOSIS — I5022 Chronic systolic (congestive) heart failure: Secondary | ICD-10-CM

## 2020-08-16 DIAGNOSIS — Z7951 Long term (current) use of inhaled steroids: Secondary | ICD-10-CM | POA: Diagnosis not present

## 2020-08-16 DIAGNOSIS — J984 Other disorders of lung: Secondary | ICD-10-CM | POA: Insufficient documentation

## 2020-08-16 LAB — BASIC METABOLIC PANEL
Anion gap: 6 (ref 5–15)
BUN: 19 mg/dL (ref 6–20)
CO2: 29 mmol/L (ref 22–32)
Calcium: 8.7 mg/dL — ABNORMAL LOW (ref 8.9–10.3)
Chloride: 102 mmol/L (ref 98–111)
Creatinine, Ser: 1.06 mg/dL (ref 0.61–1.24)
GFR, Estimated: >60 mL/min (ref >60)
Glucose, Bld: 98 mg/dL (ref 70–99)
Potassium: 4 mmol/L (ref 3.5–5.1)
Sodium: 137 mmol/L (ref 135–145)

## 2020-08-16 LAB — DIGOXIN LEVEL: Digoxin Level: 0.2 ng/mL — ABNORMAL LOW (ref 0.8–2.0)

## 2020-08-16 NOTE — Patient Instructions (Signed)
EKG done today.  Labs done today. We will contact you only if your labs are abnormal.  No medication changes were made. Please continue all current medications as prescribed.  Your physician recommends that you schedule a follow-up appointment in: 4 months with our APP [facility] here in office.   If you have any questions or concerns before your next appointment please send [place] a message through [facility] or call our office at [phone].    TO LEAVE A MESSAGE FOR THE NURSE SELECT OPTION 2, PLEASE LEAVE A MESSAGE INCLUDING: . YOUR NAME . DATE OF BIRTH . CALL BACK NUMBER . REASON FOR CALL**this is important as we prioritize the call backs  YOU WILL RECEIVE A CALL BACK THE SAME DAY AS LONG AS YOU CALL BEFORE 4:00 PM   Do the following things EVERYDAY: 1) Weigh yourself in the morning before breakfast. Write it down and keep it in a log. 2) Take your medicines as prescribed 3) Eat low salt foods--Limit salt (sodium) to 2000 mg per day.  4) Stay as active as you can everyday 5) Limit all fluids for the day to less than 2 liters   At the [facility], you and your health needs are our priority. As part of our continuing mission to provide you with exceptional heart care, we have created designated Provider Care Teams. These Care Teams include your primary Cardiologist (physician) and Advanced Practice Providers (APPs- Physician Assistants and Nurse Practitioners) who all work together to provide you with the care you need, when you need it.   You may see any of the following providers on your designated Care Team at your next follow up: Marland Kitchen Dr Glori Bickers . Dr [name] . Darrick Grinder, NP . Lyda Jester, PA . Audry Riles, PharmD   Please be sure to bring in all your medications bottles to every appointment.

## 2020-08-17 NOTE — Progress Notes (Signed)
ID:  DELBERT Cardenas, DOB 1975/05/31, MRN [id number]   Provider location: [facility] Type of Visit: Established patient  PCP:  [name], MD  Cardiologist:  [name]   History of Present Illness: Kevin Cardenas is a 45 y.o. male who has a history of VSD repair and removal of a pulmonary artery band at age 41, DM, HTN, systolic heart failure, NICM and OSA diagnosed in past but not on CPAP. Has St Jude ICD (placed 01/2015).   Echo in 3/20 showed EF 25%, mild LV dilation, mildly decreased RV systolic function.   CPX in 10/20 showed moderate functional limitation primarily due to restrictive lung disease.  I had planned to get PFTs and a high resolution CT chest to followup on lung restriction but was never scheduled.   At last appointment, he had been off his meds for several months but supposedly had restarted them.  Weight was up 20 lbs and he was volume overloaded.  I stopped Lasix and started torsemide, kept other meds as ordered.  He came to the ER later in 7/21 with a bleeding varicose vein but was noted to be hypotensive with AKI.  He was thought to be over-diuresed.  Torsemide was cut back and Entresto was decreased.   Echo in 8/21 showed EF 30-35%, no significant residual VSD, normal RV.   Patient returns for followup of CHF.  Weight is up 2 lbs.  He is short of breath after walking 3-4 blocks.  No orthopnea/PND.  No chest pain.  No palpitations.  Using CPAP.   ECG (personally reviewed): NSR, IVCD (144 msec)  St Jude device interrogation: stable thoracic impedance.   Labs (1/16): K 4.1, creatinine 0.9, TSH normal, LDL 89, HDL 46 Labs (05/24/2014): K 4.1 Creatinine 0.97  Labs (3/16): BNP 54, K 4.4, creatinine 1.0 Labs 08/14/2014: K 4.0 Creatinine 0.93  Labs (6/16): digoxin < 0.2 Labs (9/16): K 3.7, creatinine 0.93, digoxin < 0.5, HCT 40.4 Labs (01/2016): dig 0.5 K 4.1 Creatinine 0.94  Labs (09/2017): K 4.3 Creatinine 0.95 Labs (3/20): K 4.3,  creatinine 1.02, digoxin < 0.2, LDL 79 Labs (9/20): K 3.8, creatinine 0.82, digoxin < 0.2 Labs (7/21): K 3.8, creatinine 1.0 => 2.34 => 1.08, BNP 81, TSH normal Labs (9/21): digoxin 0.5, K 4.3, creatinine 1.12  PMH: 1. VSD repair at 2.  2. Type 2 diabetes. 3. HTN 4. OSA: Not using CPAP.  5. Chronic systolic CHF: Nonischemic cardiomyopathy.  St Jude ICD.  - Echo 2009: EF 35-40% - LHC 2009: No angiographic coronary disease.  - Echo 9/11: LVEF 20-25%  - Echo 11/12: LVEF 30-35%, diffuse hypokinesis and mild LVH. RV systolic mildly reduced. LA mildly dilated. Pulmonic valve with mild stenosis.  - Echo 05/12/2014: EF 15-20% Grade IDD, LV moderately dilated.  RV normal. Mild pulmonc stenosis mean peak gradient 14.  - Echo 7/16: EF 25-30%, mild LV dilation, mild LVH, trivial pulmonic stenosis with peak gradient 11 mmHg, mildly decreased RV function with mild RV dilation.   - CPX (3/16): RER 1.03, peak VO2 19.9, VE/VCO2 slope 18, mild to moderately impaired functional capacity.  There was moderate to severe mixed restrictive/obstructive pattern on spirometry => exertional hypoxemia, oxygen saturation decreased to 84%.  - RHC (4/16): mean RA 9, PA 35/12, mean PCWP 8, CI 1.8 Fick, CI 2.4 thermo.  - Echo 02/2106: EF 30-35% Grade IIDD  - Echo 3/20: EF 25%, mild LV dilation, diffuse hypokinesis, mildly decreased  RV systolic function with mild RV dilation, no definite PS noted.  - CPX (10/20): VE/VCO2 16, peak VO2 15.8 (57% predicted), RER 1.08.  Moderate functional limitation primarily due to restrictive lung disease.  Severe restriction on PFTs.  - Echo (8/21): EF 30-35%, no significant residual VSD, normal RV.  6. Pulmonic stenosis: Mild in past, not significant on 2/20 echo.  7. High resolution CT chest in 8/21: No evidence for ILD.   Current Outpatient Medications  Medication Sig Dispense Refill  . ascorbic acid (VITAMIN C) 500 MG tablet Take 500-1,000 mg by mouth daily.    Marland Kitchen aspirin 81 MG  chewable tablet Chew 1 tablet (81 mg total) by mouth daily. 30 tablet 0  . carvedilol (COREG) 25 MG tablet Take 25 mg by mouth 2 (two) times daily with a meal.    . dapagliflozin propanediol (FARXIGA) 10 MG TABS tablet TAKE 1 TABLET (10 MG TOTAL) BY MOUTH DAILY BEFORE BREAKFAST. 30 tablet 11  . digoxin (LANOXIN) 0.125 MG tablet Take 0.125 mg by mouth daily.    . fluticasone furoate-vilanterol (BREO ELLIPTA) 200-25 MCG/INH AEPB Inhale 1 puff into the lungs daily. 30 each 11  . isosorbide-hydrALAZINE (BIDIL) 20-37.5 MG tablet Take 0.5 tablets by mouth 3 (three) times daily. 90 tablet 2  . potassium chloride SA (KLOR-CON) 20 MEQ tablet TAKE 1 TABLET BY MOUTH ONCE DAILY. 60 tablet 0  . sacubitril-valsartan (ENTRESTO) 97-103 MG Take 1 tablet by mouth 2 (two) times daily. 60 tablet 11  . simvastatin (ZOCOR) 20 MG tablet TAKE 1 TABLET BY MOUTH ONCE DAILY AT BEDTIME. 30 tablet 4  . spironolactone (ALDACTONE) 25 MG tablet TAKE 1 TABLET (25 MG TOTAL) BY MOUTH DAILY. 90 tablet 2  . torsemide (DEMADEX) 20 MG tablet TAKE 1 TABLET BY MOUTH TWICE DAILY. 120 tablet 0   No current facility-administered medications for this encounter.    Allergies:   Metolazone   Social History:  The patient  reports that he quit smoking about 9 years ago. His smoking use included cigarettes. He has a 4.50 pack-year smoking history. He has never used smokeless tobacco. He reports that he does not drink alcohol and does not use drugs.   Family History:  The patient's family history includes Diabetes in his maternal grandfather and paternal grandmother; Lung cancer in his father; Stroke in his father and mother.   ROS:  Please see the history of present illness.   All other systems are personally reviewed and negative.   Exam:   BP 100/60   Pulse 85   Wt 109.2 kg (240 lb 12.8 oz)   SpO2 95%   BMI 38.87 kg/m  General: NAD Neck: No JVD, no thyromegaly or thyroid nodule.  Lungs: Clear to auscultation bilaterally with normal  respiratory effort. CV: Nondisplaced PMI.  Heart regular S1/S2, no S3/S4, 1/6 SEM RUSB.   No peripheral edema.  No carotid bruit.  Normal pedal pulses.  Abdomen: Soft, nontender, no hepatosplenomegaly, no distention.  Skin: Intact without lesions or rashes.  Neurologic: Alert and oriented x 3.  Psych: Normal affect. Extremities: No clubbing or cyanosis.  HEENT: Normal.   Recent Labs: 10/20/2019: TSH 2.030 11/07/2019: Magnesium 2.3 11/18/2019: B Natriuretic Peptide 66.6; Hemoglobin 14.1; Platelets 131 08/16/2020: BUN 19; Creatinine, Ser 1.06; Potassium 4.0; Sodium 137  Personally reviewed   Wt Readings from Last 3 Encounters:  08/16/20 109.2 kg (240 lb 12.8 oz)  12/29/19 107.5 kg (237 lb)  11/18/19 108.2 kg (238 lb 9.6 oz)  ASSESSMENT AND PLAN:  1. Chronic systolic CHF: Nonischemic cardiomyopathy.  RHC in 4/16 showed preserved cardiac index by thermodilution but low by Fick.   Last echo in 8/21 showed EF 30-35%, normal RV, no residual VSD, no definite pulmonic stenosis.  He has a [employer] officer, political party ICD. He has an IVCD on today's ECG with QRS 144 msec, may eventually benefit from CRT if QRS continues to widen > 150 msec.  NYHA class II, not volume overloaded on exam.  - Continue Coreg 25 mg bid and Entresto 97103 bid.   - Continue spironolactone 25 mg daily. BMET today.  - Continue digoxin 0.125 and check level.   - Continue Bidil 0.5 tab tid.  No BP room to increase.  - Continue torsemide 20 mg bid.   - Continue KCL 20 mEq daily.  - Continue Farxiga 10 mg daily.   - He refused Cardiomems. 2. H/o VSD: No residual shunt noted 8/21 echo.  Last RHC also did not suggest any significant left to right shunting.  3. Pulmonary stenosis: Very mild by 7/16 echo, no significant stenosis 8/21 echo.  Mild PS murmur on exam.  4. OSA: Using CPAP.   Followup with APP in 4 months.   Signed, [name], MD  08/17/2020  [facility] [address] [facility] and [place] [address] [phone] (office) [phone] (fax)

## 2020-09-04 ENCOUNTER — Telehealth: Payer: Self-pay

## 2020-09-04 NOTE — Telephone Encounter (Signed)
-----  Message from Deboraha Sprang, MD sent at 08/16/2020 10:11 AM EDT ----- Remote reviewed. This remote is abnormal for VTPoly morphic   Rosann Auerbach could you arrange for BMET and Mg plz

## 2020-09-04 NOTE — Telephone Encounter (Signed)
RN has made 3 unsuccessful phone contact attempts to pt.  See lab result notes for complete details.  Letter mailed to pt requesting that he contact [place] office.  See letter for complete details.

## 2020-09-05 IMAGING — CT CT CHEST HIGH RESOLUTION W/O CM
2 of 5 series · 15 of 36 positions shown, 18 images · non-contrast
Comparison: None.

CLINICAL DATA: Shortness of breath.

EXAM:
CT CHEST WITHOUT CONTRAST
TECHNIQUE: Multidetector CT imaging of the chest was performed following the
standard protocol without intravenous contrast. High resolution
imaging of the lungs, as well as inspiratory and expiratory imaging,
was performed.

[Series 5: chest w/o 2mm st · axial · non-contrast · 0.76mm/px · z∈[-322,-10]mm · 12 of 174 slices shown, 15 images]
[im 9/174  mediastinal]
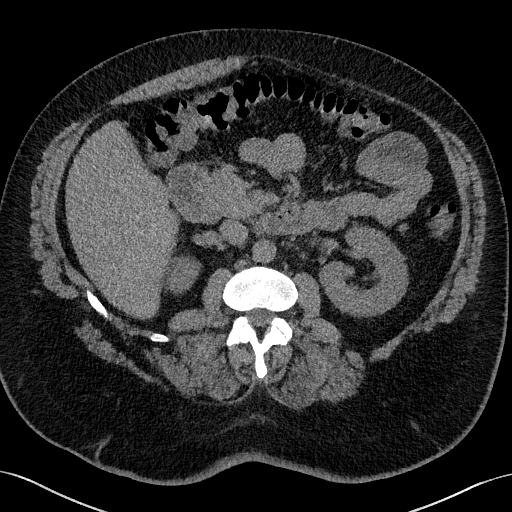
[im 9/174  lung]
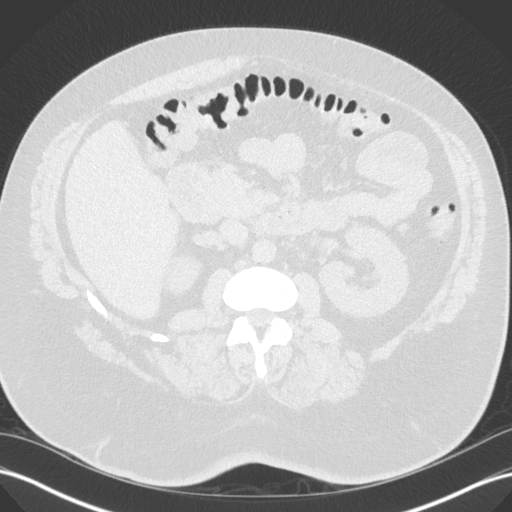
[im 25/174  lung]
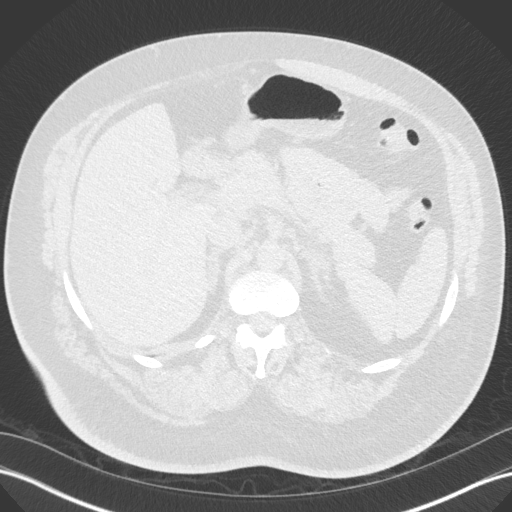
[im 42/174  lung]
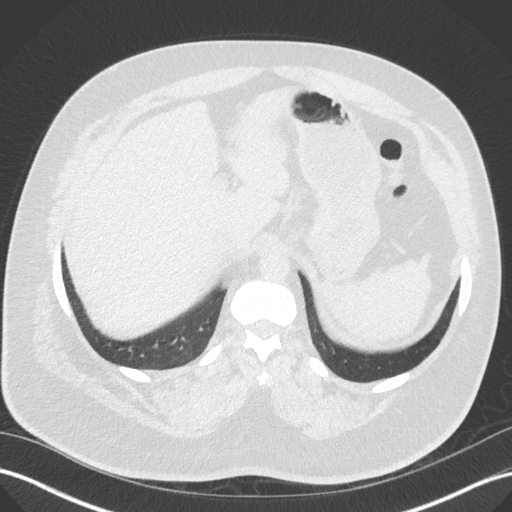
[im 50/174  lung]
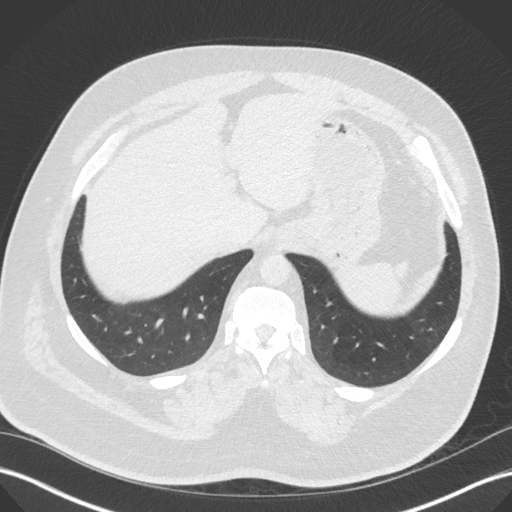
[im 66/174  mediastinal]
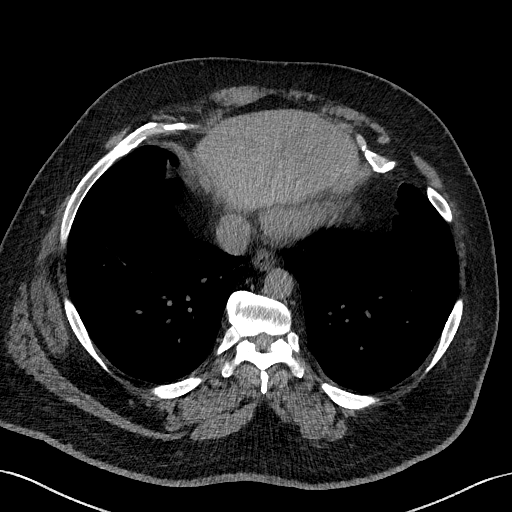
[im 66/174  lung]
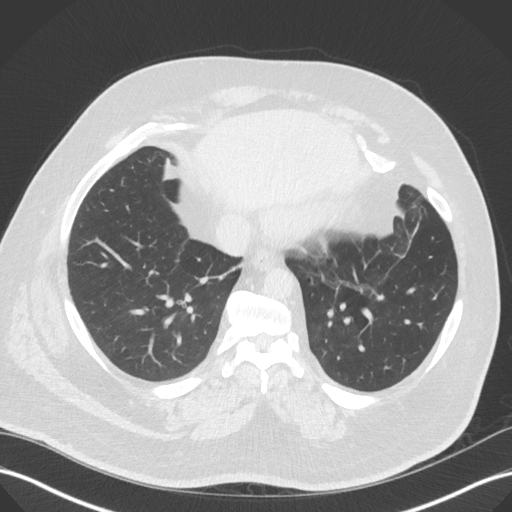
[im 83/174  lung]
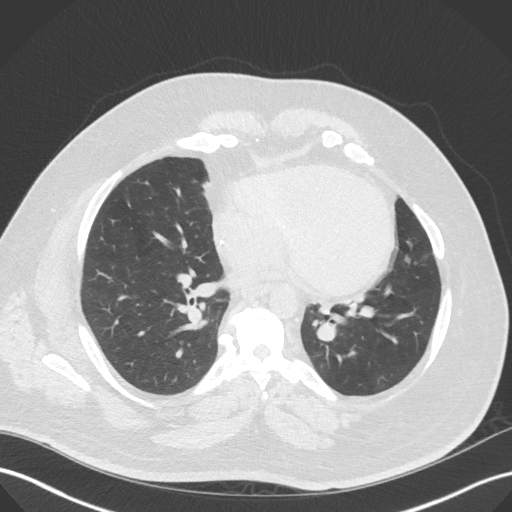
[im 91/174  lung]
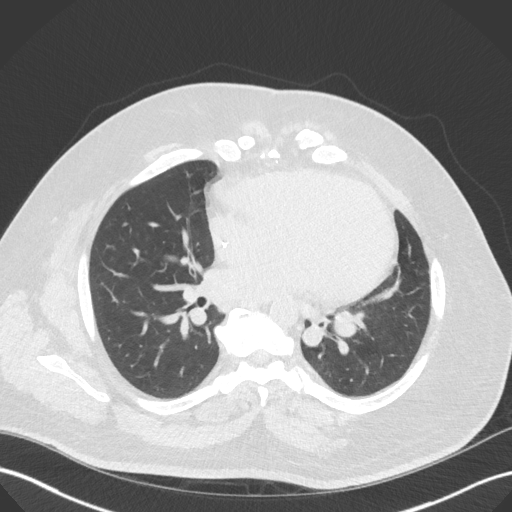
[im 108/174  lung]
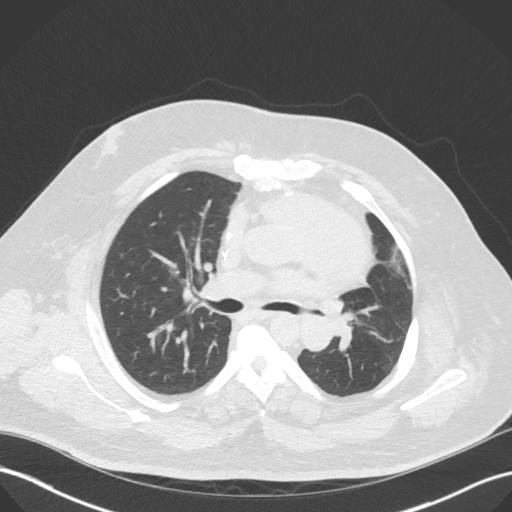
[im 124/174  mediastinal]
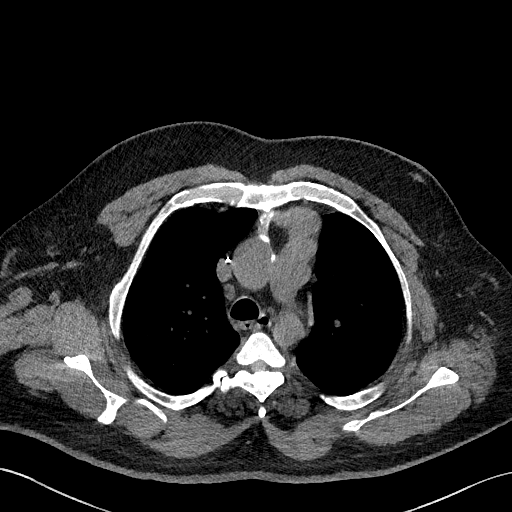
[im 124/174  lung]
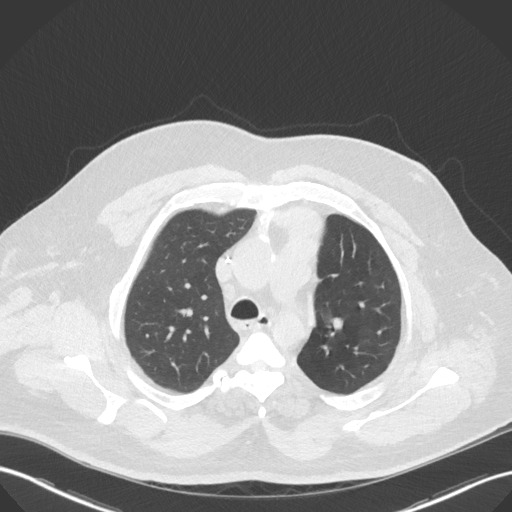
[im 132/174  lung]
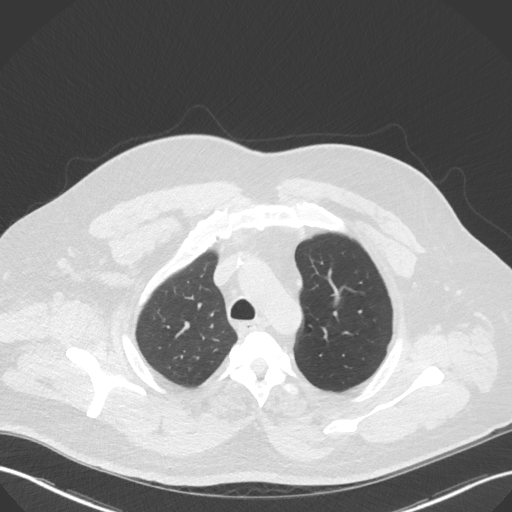
[im 149/174  lung]
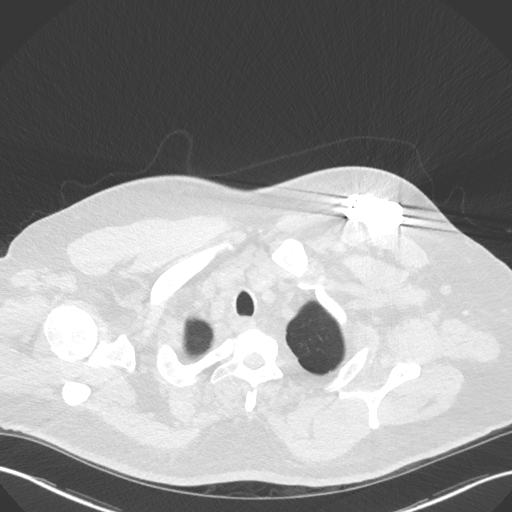
[im 165/174  lung]
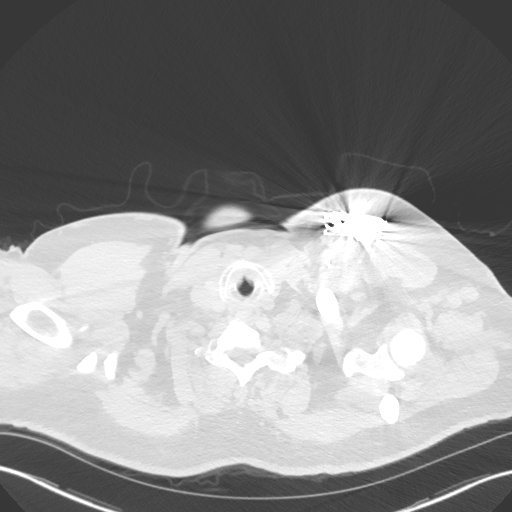

[Series 9: chest w/o 2mm st cor · coronal · non-contrast · 0.68mm/px · 3 of 160 slices shown]
[im 32/160  lung]
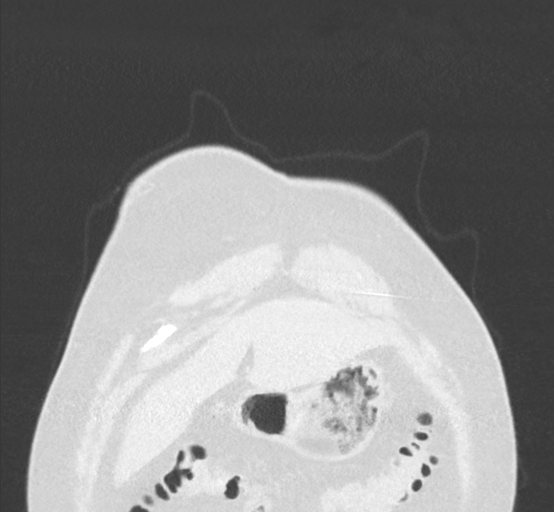
[im 64/160  lung]
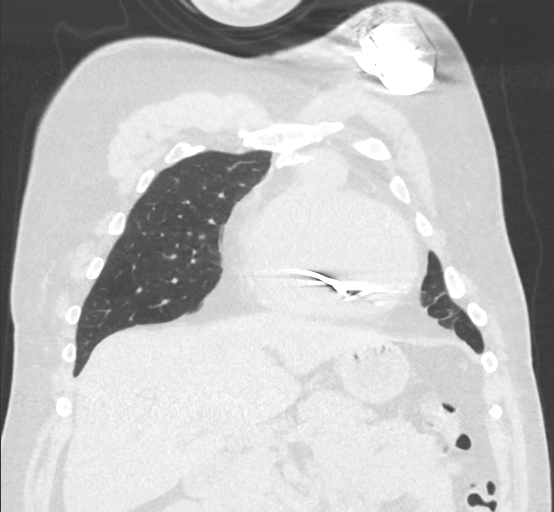
[im 96/160  lung]
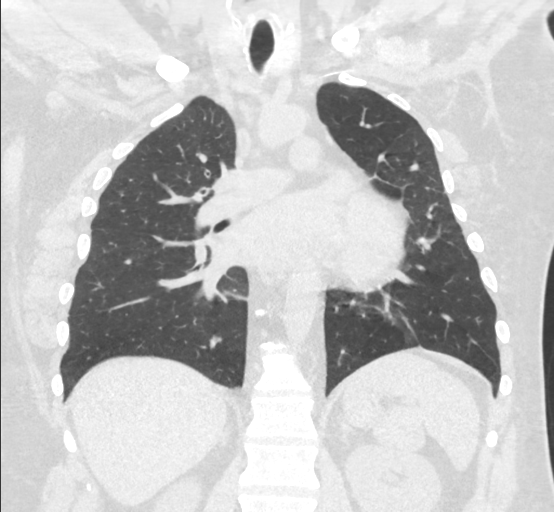

[15 of 36 positions shown; findings below may reference images not displayed]

FINDINGS: Cardiovascular: Heart is enlarged.  No pericardial effusion.

Mediastinum/Nodes: No pathologically enlarged mediastinal or
axillary lymph nodes. Hilar regions are difficult to evaluate
without IV contrast. Esophagus is unremarkable.

Lungs/Pleura: Negative for subpleural reticulation, traction
bronchiectasis/bronchiolectasis, ground-glass, architectural
distortion or honeycombing. Mild scarring in the lower lobes. No air
trapping. No pleural fluid. Slight expiratory phase imaging. Airway
is otherwise unremarkable.

Upper Abdomen: Visualized portions of the liver, adrenal glands and
right kidney are unremarkable. Tiny stones in the left kidney.
Visualized portions of the spleen, pancreas, stomach and bowel are
grossly unremarkable.

Musculoskeletal: Degenerative changes in the spine. No worrisome
lytic or sclerotic lesions.
IMPRESSION: 1. No evidence of interstitial lung disease. No pulmonary
parenchymal findings to explain the patient's shortness of breath.
2. Punctate left renal stones.

## 2020-09-14 ENCOUNTER — Telehealth (HOSPITAL_COMMUNITY): Payer: Self-pay | Admitting: Cardiology

## 2020-09-14 ENCOUNTER — Other Ambulatory Visit (HOSPITAL_COMMUNITY): Payer: Self-pay

## 2020-09-14 ENCOUNTER — Other Ambulatory Visit (HOSPITAL_COMMUNITY): Payer: Self-pay | Admitting: Cardiology

## 2020-09-14 ENCOUNTER — Other Ambulatory Visit (HOSPITAL_COMMUNITY): Payer: Self-pay | Admitting: Internal Medicine

## 2020-09-14 DIAGNOSIS — E118 Type 2 diabetes mellitus with unspecified complications: Secondary | ICD-10-CM

## 2020-09-14 DIAGNOSIS — E785 Hyperlipidemia, unspecified: Secondary | ICD-10-CM

## 2020-09-14 MED ORDER — TORSEMIDE 20 MG PO TABS
20.0000 mg | ORAL_TABLET | Freq: Two times a day (BID) | ORAL | 0 refills | Status: DC
  Filled 2020-09-14: qty 120, 60d supply, fill #0

## 2020-09-14 MED ORDER — DIGOXIN 125 MCG PO TABS
125.0000 ug | ORAL_TABLET | Freq: Every day | ORAL | 2 refills | Status: DC
  Filled 2020-09-14: qty 90, 90d supply, fill #0
  Filled 2020-12-10: qty 90, 90d supply, fill #1
  Filled 2021-03-01: qty 90, 90d supply, fill #2

## 2020-09-14 MED ORDER — ISOSORB DINITRATE-HYDRALAZINE 20-37.5 MG PO TABS
0.5000 | ORAL_TABLET | Freq: Three times a day (TID) | ORAL | 2 refills | Status: DC
  Filled 2020-09-14: qty 45, 30d supply, fill #0
  Filled 2020-12-10: qty 45, 30d supply, fill #1

## 2020-09-14 MED ORDER — CARVEDILOL 25 MG PO TABS
25.0000 mg | ORAL_TABLET | Freq: Two times a day (BID) | ORAL | 3 refills | Status: DC
  Filled 2020-09-14: qty 180, 90d supply, fill #0
  Filled 2020-12-10: qty 180, 90d supply, fill #1
  Filled 2021-03-01: qty 180, 90d supply, fill #2

## 2020-09-14 MED ORDER — ENTRESTO 97-103 MG PO TABS
1.0000 | ORAL_TABLET | Freq: Two times a day (BID) | ORAL | 11 refills | Status: DC
  Filled 2020-09-14: qty 60, 30d supply, fill #0
  Filled 2020-12-10: qty 60, 30d supply, fill #1

## 2020-09-14 MED ORDER — POTASSIUM CHLORIDE CRYS ER 20 MEQ PO TBCR
20.0000 meq | EXTENDED_RELEASE_TABLET | Freq: Every day | ORAL | 2 refills | Status: DC
  Filled 2020-09-14: qty 90, 90d supply, fill #0
  Filled 2020-12-10: qty 90, 90d supply, fill #1
  Filled 2021-03-01: qty 90, 90d supply, fill #2

## 2020-09-14 MED ORDER — DAPAGLIFLOZIN PROPANEDIOL 10 MG PO TABS
10.0000 mg | ORAL_TABLET | Freq: Every day | ORAL | 11 refills | Status: DC
  Filled 2020-09-14: qty 30, 30d supply, fill #0
  Filled 2020-12-10: qty 30, 30d supply, fill #1

## 2020-09-14 MED ORDER — SPIRONOLACTONE 25 MG PO TABS
25.0000 mg | ORAL_TABLET | Freq: Every day | ORAL | 2 refills | Status: DC
  Filled 2020-09-14: qty 90, 90d supply, fill #0
  Filled 2020-12-10: qty 90, 90d supply, fill #1
  Filled 2021-03-01: qty 90, 90d supply, fill #2

## 2020-09-14 MED FILL — Dapagliflozin Propanediol Tab 10 MG (Base Equivalent): ORAL | 30 days supply | Qty: 30 | Fill #0 | Status: CN

## 2020-09-17 ENCOUNTER — Other Ambulatory Visit (HOSPITAL_COMMUNITY): Payer: Self-pay

## 2020-09-17 MED ORDER — SIMVASTATIN 20 MG PO TABS
20.0000 mg | ORAL_TABLET | Freq: Every day | ORAL | 4 refills | Status: DC
  Filled 2020-09-17 – 2020-12-10 (×2): qty 30, 30d supply, fill #0

## 2020-09-25 ENCOUNTER — Other Ambulatory Visit (HOSPITAL_COMMUNITY): Payer: Self-pay

## 2020-10-17 ENCOUNTER — Ambulatory Visit (INDEPENDENT_AMBULATORY_CARE_PROVIDER_SITE_OTHER): Payer: Medicare Other

## 2020-10-17 DIAGNOSIS — I428 Other cardiomyopathies: Secondary | ICD-10-CM | POA: Diagnosis not present

## 2020-10-20 LAB — CUP PACEART REMOTE DEVICE CHECK
Battery Remaining Longevity: 53 mo
Battery Remaining Percentage: 50 %
Battery Voltage: 2.95 V
Brady Statistic RV Percent Paced: 1 %
Date Time Interrogation Session: 20220707114957
HighPow Impedance: 75 Ohm
HighPow Impedance: 75 Ohm
Implantable Lead Implant Date: 20161007
Implantable Lead Location: 753860
Implantable Lead Model: 181
Implantable Lead Serial Number: 331179
Implantable Pulse Generator Implant Date: 20161007
Lead Channel Impedance Value: 410 Ohm
Lead Channel Pacing Threshold Amplitude: 0.75 V
Lead Channel Pacing Threshold Pulse Width: 0.5 ms
Lead Channel Sensing Intrinsic Amplitude: 12 mV
Lead Channel Setting Pacing Amplitude: 2.5 V
Lead Channel Setting Pacing Pulse Width: 0.5 ms
Lead Channel Setting Sensing Sensitivity: 0.5 mV
Pulse Gen Serial Number: 7286706

## 2020-11-08 NOTE — Progress Notes (Signed)
Remote ICD transmission.   

## 2020-11-20 ENCOUNTER — Other Ambulatory Visit (HOSPITAL_COMMUNITY): Payer: Self-pay

## 2020-11-20 ENCOUNTER — Telehealth: Payer: Self-pay

## 2020-11-20 NOTE — Telephone Encounter (Signed)
Attempted phone call to pt and left voicemail message to contact RN at [phone].

## 2020-11-20 NOTE — Telephone Encounter (Signed)
-----  Message from Deboraha Sprang, MD sent at 08/16/2020 10:11 AM EDT ----- Remote reviewed. This remote is abnormal for VTPoly morphic   Rosann Auerbach could you arrange for BMET and Mg plz

## 2020-11-22 ENCOUNTER — Other Ambulatory Visit (HOSPITAL_COMMUNITY): Payer: Self-pay

## 2020-12-10 ENCOUNTER — Other Ambulatory Visit (HOSPITAL_COMMUNITY): Payer: Self-pay

## 2020-12-10 ENCOUNTER — Other Ambulatory Visit (HOSPITAL_COMMUNITY): Payer: Self-pay | Admitting: Cardiology

## 2020-12-11 ENCOUNTER — Other Ambulatory Visit (HOSPITAL_COMMUNITY): Payer: Self-pay

## 2020-12-11 ENCOUNTER — Other Ambulatory Visit (HOSPITAL_COMMUNITY): Payer: Self-pay | Admitting: *Deleted

## 2020-12-11 MED ORDER — TORSEMIDE 20 MG PO TABS
20.0000 mg | ORAL_TABLET | Freq: Two times a day (BID) | ORAL | 3 refills | Status: DC
  Filled 2020-12-11: qty 120, 60d supply, fill #0

## 2020-12-11 MED ORDER — TORSEMIDE 20 MG PO TABS
20.0000 mg | ORAL_TABLET | Freq: Two times a day (BID) | ORAL | 0 refills | Status: DC
  Filled 2020-12-11: qty 120, 60d supply, fill #0

## 2020-12-18 ENCOUNTER — Ambulatory Visit (HOSPITAL_COMMUNITY)
Admission: RE | Admit: 2020-12-18 | Discharge: 2020-12-18 | Disposition: A | Discharge status: Home / Self Care | Payer: Medicare Other | Source: Ambulatory Visit | Attending: Cardiology | Admitting: Cardiology

## 2020-12-18 ENCOUNTER — Other Ambulatory Visit (HOSPITAL_COMMUNITY): Payer: Self-pay

## 2020-12-18 ENCOUNTER — Other Ambulatory Visit: Payer: Self-pay

## 2020-12-18 ENCOUNTER — Encounter (HOSPITAL_COMMUNITY): Payer: Self-pay | Admitting: Cardiology

## 2020-12-18 VITALS — BP 110/70 | HR 88 | Wt 239.6 lb

## 2020-12-18 DIAGNOSIS — Z8774 Personal history of (corrected) congenital malformations of heart and circulatory system: Secondary | ICD-10-CM | POA: Diagnosis not present

## 2020-12-18 DIAGNOSIS — I110 Hypertensive heart disease with heart failure: Secondary | ICD-10-CM | POA: Insufficient documentation

## 2020-12-18 DIAGNOSIS — Z7982 Long term (current) use of aspirin: Secondary | ICD-10-CM | POA: Insufficient documentation

## 2020-12-18 DIAGNOSIS — I5022 Chronic systolic (congestive) heart failure: Secondary | ICD-10-CM | POA: Diagnosis not present

## 2020-12-18 DIAGNOSIS — I428 Other cardiomyopathies: Secondary | ICD-10-CM | POA: Insufficient documentation

## 2020-12-18 DIAGNOSIS — Z6838 Body mass index (BMI) 38.0-38.9, adult: Secondary | ICD-10-CM | POA: Insufficient documentation

## 2020-12-18 DIAGNOSIS — Z95810 Presence of automatic (implantable) cardiac defibrillator: Secondary | ICD-10-CM | POA: Insufficient documentation

## 2020-12-18 DIAGNOSIS — Z87891 Personal history of nicotine dependence: Secondary | ICD-10-CM | POA: Diagnosis not present

## 2020-12-18 DIAGNOSIS — Z79899 Other long term (current) drug therapy: Secondary | ICD-10-CM | POA: Diagnosis not present

## 2020-12-18 DIAGNOSIS — G4733 Obstructive sleep apnea (adult) (pediatric): Secondary | ICD-10-CM | POA: Insufficient documentation

## 2020-12-18 DIAGNOSIS — Z7984 Long term (current) use of oral hypoglycemic drugs: Secondary | ICD-10-CM | POA: Diagnosis not present

## 2020-12-18 DIAGNOSIS — R011 Cardiac murmur, unspecified: Secondary | ICD-10-CM | POA: Diagnosis not present

## 2020-12-18 DIAGNOSIS — E669 Obesity, unspecified: Secondary | ICD-10-CM | POA: Diagnosis not present

## 2020-12-18 LAB — BASIC METABOLIC PANEL
Anion gap: 7 (ref 5–15)
BUN: 19 mg/dL (ref 6–20)
CO2: 31 mmol/L (ref 22–32)
Calcium: 9.1 mg/dL (ref 8.9–10.3)
Chloride: 102 mmol/L (ref 98–111)
Creatinine, Ser: 1.1 mg/dL (ref 0.61–1.24)
GFR, Estimated: >60 mL/min (ref >60)
Glucose, Bld: 111 mg/dL — ABNORMAL HIGH (ref 70–99)
Potassium: 3.7 mmol/L (ref 3.5–5.1)
Sodium: 140 mmol/L (ref 135–145)

## 2020-12-18 LAB — MAGNESIUM: Magnesium: 2.3 mg/dL (ref 1.7–2.4)

## 2020-12-18 LAB — DIGOXIN LEVEL: Digoxin Level: <0.2 ng/mL — ABNORMAL LOW (ref 0.8–2.0)

## 2020-12-18 MED ORDER — ISOSORB DINITRATE-HYDRALAZINE 20-37.5 MG PO TABS
1.0000 | ORAL_TABLET | Freq: Three times a day (TID) | ORAL | 6 refills | Status: DC
  Filled 2020-12-18 – 2021-05-20 (×2): qty 90, 30d supply, fill #0

## 2020-12-18 NOTE — Patient Instructions (Addendum)
INCREASE Bidil to 1 tab three times a day  Labs today We will only contact you if something comes back abnormal or we need to make some changes. Otherwise no news is good news!  You have been referred to the weight loss clinic. They will call you with an appointment time.    Your physician has requested that you have an echocardiogram. Echocardiography is a painless test that uses sound waves to create images of your heart. It provides your doctor with information about the size and shape of your heart and how well your heart's chambers and valves are working. This procedure takes approximately one hour. There are no restrictions for this procedure.  Your physician recommends that you schedule a follow-up appointment in: 4 months with Dr Aundra Dubin and an echo  Please call office at [phone] option 2 if you have any questions or concerns.   At the [facility], you and your health needs are our priority. As part of our continuing mission to provide you with exceptional heart care, we have created designated Provider Care Teams. These Care Teams include your primary Cardiologist (physician) and Advanced Practice Providers (APPs- Physician Assistants and Nurse Practitioners) who all work together to provide you with the care you need, when you need it.   You may see any of the following providers on your designated Care Team at your next follow up: Dr Glori Bickers Dr [name] Dr Patrice Paradise, NP Lyda Jester, [place] Ginnie Smart Audry Riles, PharmD   Please be sure to bring in all your medications bottles to every appointment.

## 2020-12-19 ENCOUNTER — Other Ambulatory Visit (HOSPITAL_COMMUNITY): Payer: Self-pay

## 2020-12-19 NOTE — Progress Notes (Signed)
ID:  Kevin Cardenas, DOB 1975/06/20, MRN [id number]   Provider location: [facility] Type of Visit: Established patient  PCP:  [name], MD  Cardiologist:  [name]   History of Present Illness: Kevin Cardenas is a 45 y.o. male who has a history of VSD repair and removal of a pulmonary artery band at age 41, DM, HTN, systolic heart failure, NICM and OSA diagnosed in past but not on CPAP. Has St Jude ICD (placed 01/2015).   Echo in 3/20 showed EF 25%, mild LV dilation, mildly decreased RV systolic function.   CPX in 10/20 showed moderate functional limitation primarily due to restrictive lung disease.  I had planned to get PFTs and a high resolution CT chest to followup on lung restriction but was never scheduled.   At last appointment, he had been off his meds for several months but supposedly had restarted them.  Weight was up 20 lbs and he was volume overloaded.  I stopped Lasix and started torsemide, kept other meds as ordered.  He came to the ER later in 7/21 with a bleeding varicose vein but was noted to be hypotensive with AKI.  He was thought to be over-diuresed.  Torsemide was cut back and Entresto was decreased.   Echo in 8/21 showed EF 30-35%, no significant residual VSD, normal RV.    Patient returns for followup of CHF.  Weight down 1 lb.  He is short of breath walking 3-4 blocks, does ok walking up a flight of stairs.  No orthopnea/PND.  No chest pain.  No lightheadedness. Taking all his meds.  He is using CPAP.   ECG (personally reviewed): NSR, IVCD (126 msec)  Labs (1/16): K 4.1, creatinine 0.9, TSH normal, LDL 89, HDL 46 Labs (05/24/2014): K 4.1 Creatinine 0.97  Labs (3/16): BNP 54, K 4.4, creatinine 1.0 Labs 08/14/2014: K 4.0 Creatinine 0.93  Labs (6/16): digoxin < 0.2 Labs (9/16): K 3.7, creatinine 0.93, digoxin < 0.5, HCT 40.4 Labs (01/2016): dig 0.5 K 4.1 Creatinine 0.94  Labs (09/2017): K 4.3 Creatinine 0.95 Labs (3/20): K 4.3,  creatinine 1.02, digoxin < 0.2, LDL 79 Labs (9/20): K 3.8, creatinine 0.82, digoxin < 0.2 Labs (7/21): K 3.8, creatinine 1.0 => 2.34 => 1.08, BNP 81, TSH normal Labs (9/21): digoxin 0.5, K 4.3, creatinine 1.12 Labs (5/22): digoxin 0.2, K 4, creatinine 1.06    PMH: 1. VSD repair at 2.  2. Type 2 diabetes. 3. HTN 4. OSA: Not using CPAP.  5. Chronic systolic CHF: Nonischemic cardiomyopathy.  St Jude ICD.  - Echo 2009: EF 35-40% - LHC 2009: No angiographic coronary disease.  - Echo 9/11: LVEF 20-25%  - Echo 11/12: LVEF 30-35%, diffuse hypokinesis and mild LVH. RV systolic mildly reduced. LA mildly dilated. Pulmonic valve with mild stenosis.  - Echo 05/12/2014: EF 15-20% Grade IDD, LV moderately dilated.  RV normal. Mild pulmonc stenosis mean peak gradient 14.  - Echo 7/16: EF 25-30%, mild LV dilation, mild LVH, trivial pulmonic stenosis with peak gradient 11 mmHg, mildly decreased RV function with mild RV dilation.   - CPX (3/16): RER 1.03, peak VO2 19.9, VE/VCO2 slope 18, mild to moderately impaired functional capacity.  There was moderate to severe mixed restrictive/obstructive pattern on spirometry => exertional hypoxemia, oxygen saturation decreased to 84%.  - RHC (4/16): mean RA 9, PA 35/12, mean PCWP 8, CI 1.8 Fick, CI 2.4 thermo.  - Echo 02/2106: EF 30-35% Grade  IIDD  - Echo 3/20: EF 25%, mild LV dilation, diffuse hypokinesis, mildly decreased RV systolic function with mild RV dilation, no definite PS noted.  - CPX (10/20): VE/VCO2 16, peak VO2 15.8 (57% predicted), RER 1.08.  Moderate functional limitation primarily due to restrictive lung disease.  Severe restriction on PFTs.  - Echo (8/21): EF 30-35%, no significant residual VSD, normal RV.  6. Pulmonic stenosis: Mild in past, not significant on 2/20 echo.  7. High resolution CT chest in 8/21: No evidence for ILD.   Current Outpatient Medications  Medication Sig Dispense Refill   ascorbic acid (VITAMIN C) 500 MG tablet Take  500-1,000 mg by mouth daily.     aspirin 81 MG chewable tablet Chew 1 tablet (81 mg total) by mouth daily. 30 tablet 0   carvedilol (COREG) 25 MG tablet Take 1 tablet (25 mg total) by mouth 2 (two) times daily with a meal. 180 tablet 3   dapagliflozin propanediol (FARXIGA) 10 MG TABS tablet TAKE 1 TABLET (10 MG TOTAL) BY MOUTH DAILY BEFORE BREAKFAST. 30 tablet 11   digoxin (LANOXIN) 0.125 MG tablet Take 1 tablet (125 mcg total) by mouth daily. 90 tablet 2   fluticasone furoate-vilanterol (BREO ELLIPTA) 200-25 MCG/INH AEPB Inhale 1 puff into the lungs daily. 30 each 11   potassium chloride SA (KLOR-CON) 20 MEQ tablet Take 1 tablet (20 mEq total) by mouth daily. 90 tablet 2   sacubitril-valsartan (ENTRESTO) 97-103 MG Take 1 tablet by mouth 2 (two) times daily. 60 tablet 11   simvastatin (ZOCOR) 20 MG tablet Take 1 tablet (20 mg total) by mouth at bedtime. 30 tablet 4   spironolactone (ALDACTONE) 25 MG tablet Take 1 tablet (25 mg total) by mouth daily. 90 tablet 2   torsemide (DEMADEX) 20 MG tablet Take 1 tablet (20 mg total) by mouth 2 (two) times daily. 120 tablet 0   isosorbide-hydrALAZINE (BIDIL) 20-37.5 MG tablet Take 1 tablet by mouth 3 (three) times daily. 90 tablet 6   No current facility-administered medications for this encounter.    Allergies:   Metolazone   Social History:  The patient  reports that he quit smoking about 9 years ago. His smoking use included cigarettes. He has a 4.50 pack-year smoking history. He has never used smokeless tobacco. He reports that he does not drink alcohol and does not use drugs.   Family History:  The patient's family history includes Diabetes in his maternal grandfather and paternal grandmother; Lung cancer in his father; Stroke in his father and mother.   ROS:  Please see the history of present illness.   All other systems are personally reviewed and negative.   Exam:   BP 110/70   Pulse 88   Wt 108.7 kg (239 lb 9.6 oz)   SpO2 94%   BMI 38.67  kg/m  General: NAD, obese.  Neck: No JVD, no thyromegaly or thyroid nodule.  Lungs: Clear to auscultation bilaterally with normal respiratory effort. CV: Nondisplaced PMI.  Heart regular S1/S2, no S3/S4, 1/6 SEM RUSB.  No peripheral edema.  No carotid bruit.  Normal pedal pulses.  Abdomen: Soft, nontender, no hepatosplenomegaly, no distention.  Skin: Intact without lesions or rashes.  Neurologic: Alert and oriented x 3.  Psych: Normal affect. Extremities: No clubbing or cyanosis.  HEENT: Normal.    Recent Labs: 12/18/2020: BUN 19; Creatinine, Ser 1.10; Magnesium 2.3; Potassium 3.7; Sodium 140  Personally reviewed   Wt Readings from Last 3 Encounters:  12/18/20 108.7 kg (239 lb  9.6 oz)  08/16/20 109.2 kg (240 lb 12.8 oz)  12/29/19 107.5 kg (237 lb)    ASSESSMENT AND PLAN:  1. Chronic systolic CHF: Nonischemic cardiomyopathy.  RHC in 4/16 showed preserved cardiac index by thermodilution but low by Fick.   Last echo in 8/21 showed EF 30-35%, normal RV, no residual VSD, no definite pulmonic stenosis.  He has a Research officer, political party ICD. He has an IVCD on today's ECG with QRS 126 msec, not CRT candidate.  NYHA class II, not volume overloaded on exam.  - Continue Coreg 25 mg bid and Entresto 97103 bid.   - Continue spironolactone 25 mg daily. BMET today.  - Continue digoxin 0.125 and check level.   - Increase Bidil to 1 tab tid.  - Continue torsemide 20 mg bid.   - Continue KCL 20 mEq daily.  - Continue Farxiga 10 mg daily.   - He refused Cardiomems. - Repeat echo at followup in 4 months.  2. H/o VSD: No residual shunt noted 8/21 echo.  Last RHC also did not suggest any significant left to right shunting.  3. Pulmonary stenosis: Very mild by 7/16 echo, no significant stenosis 8/21 echo.  Mild PS murmur on exam.  4. OSA: Using CPAP.  5. Obesity: I will refer to [facility] for semaglutide.   Followup with echo in 4 months.   Signed, [name], MD  12/19/2020  [facility] [address] Heart and [place] [address] [phone] (office) [phone] (fax)

## 2020-12-20 ENCOUNTER — Other Ambulatory Visit (HOSPITAL_COMMUNITY): Payer: Self-pay

## 2020-12-21 ENCOUNTER — Other Ambulatory Visit (HOSPITAL_COMMUNITY): Payer: Self-pay

## 2020-12-31 ENCOUNTER — Other Ambulatory Visit (HOSPITAL_COMMUNITY): Payer: Self-pay

## 2021-01-01 ENCOUNTER — Ambulatory Visit: Payer: Medicare Other

## 2021-01-01 NOTE — Progress Notes (Deleted)
     01/01/2021 [name] 06/15/1975 [id number]   HPI:  Kevin Cardenas is a 45 y.o. male patient of [name], with a PMH below who presents today for weight loss therapy with GLP1-RA.  See medical history below.     Past Medical History: CHF Chronic systolic, EF  hypertension Controlled at last visit at 110/70 on Bidil, spironolactone, carvedilol, Entresto  hyperlipidemia On simvastatin 20   Pre-diabetes 2017 A1c at 6.1, previously as high as 6.9 (2008)        Blood Pressure Goal:  130/80  Current Medications:  Family Hx:  Social Hx:   Diet:   Exercise:   Home BP readings:   Intolerances:   Labs:    Wt Readings from Last 3 Encounters:  12/18/20 239 lb 9.6 oz (108.7 kg)  08/16/20 240 lb 12.8 oz (109.2 kg)  12/29/19 237 lb (107.5 kg)   BP Readings from Last 3 Encounters:  12/18/20 110/70  08/16/20 100/60  12/29/19 122/68   Pulse Readings from Last 3 Encounters:  12/18/20 88  08/16/20 85  12/29/19 68    Current Outpatient Medications  Medication Sig Dispense Refill   ascorbic acid (VITAMIN C) 500 MG tablet Take 500-1,000 mg by mouth daily.     aspirin 81 MG chewable tablet Chew 1 tablet (81 mg total) by mouth daily. 30 tablet 0   carvedilol (COREG) 25 MG tablet Take 1 tablet (25 mg total) by mouth 2 (two) times daily with a meal. 180 tablet 3   dapagliflozin propanediol (FARXIGA) 10 MG TABS tablet TAKE 1 TABLET (10 MG TOTAL) BY MOUTH DAILY BEFORE BREAKFAST. 30 tablet 11   digoxin (LANOXIN) 0.125 MG tablet Take 1 tablet (125 mcg total) by mouth daily. 90 tablet 2   fluticasone furoate-vilanterol (BREO ELLIPTA) 200-25 MCG/INH AEPB Inhale 1 puff into the lungs daily. 30 each 11   isosorbide-hydrALAZINE (BIDIL) 20-37.5 MG tablet Take 1 tablet by mouth 3 (three) times daily. 90 tablet 6   potassium chloride SA (KLOR-CON) 20 MEQ tablet Take 1 tablet (20 mEq total) by mouth daily. 90 tablet 2   sacubitril-valsartan (ENTRESTO) 97-103 MG Take 1 tablet by  mouth 2 (two) times daily. 60 tablet 11   simvastatin (ZOCOR) 20 MG tablet Take 1 tablet (20 mg total) by mouth at bedtime. 30 tablet 4   spironolactone (ALDACTONE) 25 MG tablet Take 1 tablet (25 mg total) by mouth daily. 90 tablet 2   torsemide (DEMADEX) 20 MG tablet Take 1 tablet (20 mg total) by mouth 2 (two) times daily. 120 tablet 0   No current facility-administered medications for this visit.    Allergies  Allergen Reactions   Metolazone Other (See Comments)    "Dried up my kidneys- took only one tablet"    Past Medical History:  Diagnosis Date   Chronic systolic CHF (congestive heart failure) (HCC)    EF 25% by echo 06/2018   Diabetes mellitus    Hypertension    Non-ischemic cardiomyopathy ([place])    Obstructive sleep apnea    VSD (ventricular septal defect) 1978   repaired at age 63    There were no vitals taken for this visit.  No problem-specific Assessment & Plan notes found for this encounter.   Tommy Medal PharmD CPP [facility] [address] [place], [place] [address] [phone]

## 2021-01-16 ENCOUNTER — Ambulatory Visit (INDEPENDENT_AMBULATORY_CARE_PROVIDER_SITE_OTHER): Payer: Medicare Other

## 2021-01-16 DIAGNOSIS — I428 Other cardiomyopathies: Secondary | ICD-10-CM

## 2021-01-16 LAB — CUP PACEART REMOTE DEVICE CHECK
Battery Remaining Longevity: 52 mo
Battery Remaining Percentage: 48 %
Battery Voltage: 2.95 V
Brady Statistic RV Percent Paced: 1 %
Date Time Interrogation Session: 20221005020025
HighPow Impedance: 82 Ohm
HighPow Impedance: 82 Ohm
Implantable Lead Implant Date: 20161007
Implantable Lead Location: 753860
Implantable Lead Model: 181
Implantable Lead Serial Number: 331179
Implantable Pulse Generator Implant Date: 20161007
Lead Channel Impedance Value: 410 Ohm
Lead Channel Pacing Threshold Amplitude: 0.75 V
Lead Channel Pacing Threshold Pulse Width: 0.5 ms
Lead Channel Sensing Intrinsic Amplitude: 12 mV
Lead Channel Setting Pacing Amplitude: 2.5 V
Lead Channel Setting Pacing Pulse Width: 0.5 ms
Lead Channel Setting Sensing Sensitivity: 0.5 mV
Pulse Gen Serial Number: 7286706

## 2021-01-17 ENCOUNTER — Ambulatory Visit: Payer: Medicare Other

## 2021-01-17 NOTE — Progress Notes (Deleted)
     01/17/2021 Kevin Cardenas 1975/12/18 [id number]   HPI:  Kevin Cardenas is a 45 y.o. male patient of Dr ***, with a PMH below who presents today for hypertension clinic evaluation.  Past Medical History: hypertension   CHF Chronic systolic, EF 40-98%              Blood Pressure Goal:  130/80  Current Medications:  Family Hx:  Social Hx:   Diet:   Exercise:   Home BP readings:   Intolerances:   Labs:    Wt Readings from Last 3 Encounters:  12/18/20 239 lb 9.6 oz (108.7 kg)  08/16/20 240 lb 12.8 oz (109.2 kg)  12/29/19 237 lb (107.5 kg)   BP Readings from Last 3 Encounters:  12/18/20 110/70  08/16/20 100/60  12/29/19 122/68   Pulse Readings from Last 3 Encounters:  12/18/20 88  08/16/20 85  12/29/19 68    Current Outpatient Medications  Medication Sig Dispense Refill   ascorbic acid (VITAMIN C) 500 MG tablet Take 500-1,000 mg by mouth daily.     aspirin 81 MG chewable tablet Chew 1 tablet (81 mg total) by mouth daily. 30 tablet 0   carvedilol (COREG) 25 MG tablet Take 1 tablet (25 mg total) by mouth 2 (two) times daily with a meal. 180 tablet 3   dapagliflozin propanediol (FARXIGA) 10 MG TABS tablet TAKE 1 TABLET (10 MG TOTAL) BY MOUTH DAILY BEFORE BREAKFAST. 30 tablet 11   digoxin (LANOXIN) 0.125 MG tablet Take 1 tablet (125 mcg total) by mouth daily. 90 tablet 2   fluticasone furoate-vilanterol (BREO ELLIPTA) 200-25 MCG/INH AEPB Inhale 1 puff into the lungs daily. 30 each 11   isosorbide-hydrALAZINE (BIDIL) 20-37.5 MG tablet Take 1 tablet by mouth 3 (three) times daily. 90 tablet 6   potassium chloride SA (KLOR-CON) 20 MEQ tablet Take 1 tablet (20 mEq total) by mouth daily. 90 tablet 2   sacubitril-valsartan (ENTRESTO) 97-103 MG Take 1 tablet by mouth 2 (two) times daily. 60 tablet 11   simvastatin (ZOCOR) 20 MG tablet Take 1 tablet (20 mg total) by mouth at bedtime. 30 tablet 4   spironolactone (ALDACTONE) 25 MG tablet Take 1 tablet (25 mg total)  by mouth daily. 90 tablet 2   torsemide (DEMADEX) 20 MG tablet Take 1 tablet (20 mg total) by mouth 2 (two) times daily. 120 tablet 0   No current facility-administered medications for this visit.    Allergies  Allergen Reactions   Metolazone Other (See Comments)    "Dried up my kidneys- took only one tablet"    Past Medical History:  Diagnosis Date   Chronic systolic CHF (congestive heart failure) (HCC)    EF 25% by echo 06/2018   Diabetes mellitus    Hypertension    Non-ischemic cardiomyopathy ([place])    Obstructive sleep apnea    VSD (ventricular septal defect) 1978   repaired at age 64    There were no vitals taken for this visit.  No problem-specific Assessment & Plan notes found for this encounter.   Tommy Medal PharmD CPP [facility] [address] [place], [place] [address] [phone]

## 2021-01-24 NOTE — Progress Notes (Signed)
Remote ICD transmission.   

## 2021-02-07 ENCOUNTER — Ambulatory Visit: Payer: Medicare Other

## 2021-02-07 ENCOUNTER — Telehealth: Payer: Self-pay

## 2021-02-07 NOTE — Telephone Encounter (Signed)
Lmom to r/s missed appt

## 2021-02-07 NOTE — Progress Notes (Deleted)
Patient ID: [name]                 DOB: 11-27-1975                    MRN: [id number]     HPI: Kevin Cardenas is a 45 y.o. male patient referred to pharmacy clinic by *** to initiate weight loss therapy with GLP1-RA. PMH is significant for obesity complicated by chronic medical conditions including ***. Most recent BMI ***, previous BMI ***.  *** If diabetic and on insulin/sulfonylurea, can consider reducing dose to reduce risk of hypoglycemia  *** Follow-up visit  Assess % weight loss Assess adverse effects Missed doses  Current weight management medications:   Previously tried meds:  Current meds that may affect weight:  Baseline weight/BMI:  Insurance payor:   Diet:  -Breakfast: -Lunch: -Dinner: -Snacks: -Drinks:  Exercise:   Family History:   Social History:   Labs: Lab Results  Component Value Date   HGBA1C 6.1 01/15/2016    Wt Readings from Last 1 Encounters:  12/18/20 239 lb 9.6 oz (108.7 kg)    BP Readings from Last 1 Encounters:  12/18/20 110/70   Pulse Readings from Last 1 Encounters:  12/18/20 88       Component Value Date/Time   CHOL 137 06/15/2018 1116   TRIG 97 06/15/2018 1116   HDL 39 (L) 06/15/2018 1116   CHOLHDL 3.5 06/15/2018 1116   VLDL 19 06/15/2018 1116   [place] 79 06/15/2018 1116    Past Medical History:  Diagnosis Date   Chronic systolic CHF (congestive heart failure) (HCC)    EF 25% by echo 06/2018   Diabetes mellitus    Hypertension    Non-ischemic cardiomyopathy ([place])    Obstructive sleep apnea    VSD (ventricular septal defect) 1978   repaired at age 83    Current Outpatient Medications on File Prior to Visit  Medication Sig Dispense Refill   ascorbic acid (VITAMIN C) 500 MG tablet Take 500-1,000 mg by mouth daily.     aspirin 81 MG chewable tablet Chew 1 tablet (81 mg total) by mouth daily. 30 tablet 0   carvedilol (COREG) 25 MG tablet Take 1 tablet (25 mg total) by mouth 2 (two) times daily with a  meal. 180 tablet 3   dapagliflozin propanediol (FARXIGA) 10 MG TABS tablet TAKE 1 TABLET (10 MG TOTAL) BY MOUTH DAILY BEFORE BREAKFAST. 30 tablet 11   digoxin (LANOXIN) 0.125 MG tablet Take 1 tablet (125 mcg total) by mouth daily. 90 tablet 2   fluticasone furoate-vilanterol (BREO ELLIPTA) 200-25 MCG/INH AEPB Inhale 1 puff into the lungs daily. 30 each 11   isosorbide-hydrALAZINE (BIDIL) 20-37.5 MG tablet Take 1 tablet by mouth 3 (three) times daily. 90 tablet 6   potassium chloride SA (KLOR-CON) 20 MEQ tablet Take 1 tablet (20 mEq total) by mouth daily. 90 tablet 2   sacubitril-valsartan (ENTRESTO) 97-103 MG Take 1 tablet by mouth 2 (two) times daily. 60 tablet 11   simvastatin (ZOCOR) 20 MG tablet Take 1 tablet (20 mg total) by mouth at bedtime. 30 tablet 4   spironolactone (ALDACTONE) 25 MG tablet Take 1 tablet (25 mg total) by mouth daily. 90 tablet 2   torsemide (DEMADEX) 20 MG tablet Take 1 tablet (20 mg total) by mouth 2 (two) times daily. 120 tablet 0   No current facility-administered medications on file prior to visit.    Allergies  Allergen Reactions  Metolazone Other (See Comments)    "Dried up my kidneys- took only one tablet"     Assessment/Plan:  1. Weight loss - Patient has not met goal of at least 5% of body weight loss with comprehensive lifestyle modifications alone in the past 3-6 months. Pharmacotherapy is appropriate to pursue as augmentation. Will start ***. Confirmed patient not ***pregnant and no personal or family history of medullary thyroid carcinoma (MTC) or Multiple Endocrine Neoplasia syndrome type 2 (MEN 2).   Advised patient on common side effects including nausea, diarrhea, dyspepsia, decreased appetite, and fatigue. Counseled patient on reducing meal size and how to titrate medication to minimize side effects. Counseled patient to call if intolerable side effects or if experiencing dehydration, abdominal pain, or dizziness. Patient will adhere to dietary  modifications and will target at least 150 minutes of moderate intensity exercise weekly.   Follow up in ***.

## 2021-02-14 ENCOUNTER — Encounter: Payer: Self-pay | Admitting: Internal Medicine

## 2021-03-01 ENCOUNTER — Other Ambulatory Visit (HOSPITAL_COMMUNITY): Payer: Self-pay | Admitting: Cardiology

## 2021-03-01 ENCOUNTER — Other Ambulatory Visit (HOSPITAL_COMMUNITY): Payer: Self-pay

## 2021-03-01 DIAGNOSIS — E785 Hyperlipidemia, unspecified: Secondary | ICD-10-CM

## 2021-03-01 DIAGNOSIS — E118 Type 2 diabetes mellitus with unspecified complications: Secondary | ICD-10-CM

## 2021-03-01 MED ORDER — ENTRESTO 97-103 MG PO TABS
1.0000 | ORAL_TABLET | Freq: Two times a day (BID) | ORAL | 3 refills | Status: DC
  Filled 2021-03-01: qty 180, 90d supply, fill #0

## 2021-03-01 MED ORDER — SIMVASTATIN 20 MG PO TABS
20.0000 mg | ORAL_TABLET | Freq: Every day | ORAL | 3 refills | Status: DC
  Filled 2021-03-01: qty 90, 90d supply, fill #0
  Filled 2021-08-19: qty 90, 90d supply, fill #1
  Filled 2021-11-04: qty 90, 90d supply, fill #2

## 2021-03-01 MED ORDER — TORSEMIDE 20 MG PO TABS
20.0000 mg | ORAL_TABLET | Freq: Two times a day (BID) | ORAL | 3 refills | Status: DC
  Filled 2021-03-01: qty 180, 90d supply, fill #0
  Filled 2021-05-20: qty 180, 90d supply, fill #1

## 2021-03-01 MED ORDER — DAPAGLIFLOZIN PROPANEDIOL 10 MG PO TABS
10.0000 mg | ORAL_TABLET | Freq: Every day | ORAL | 3 refills | Status: DC
  Filled 2021-03-01: qty 90, 90d supply, fill #0

## 2021-03-01 MED ORDER — TORSEMIDE 20 MG PO TABS
20.0000 mg | ORAL_TABLET | Freq: Two times a day (BID) | ORAL | 6 refills | Status: DC
  Filled 2021-03-01: qty 60, 30d supply, fill #0

## 2021-04-16 ENCOUNTER — Ambulatory Visit: Payer: Self-pay | Admitting: Family Medicine

## 2021-04-17 ENCOUNTER — Ambulatory Visit (INDEPENDENT_AMBULATORY_CARE_PROVIDER_SITE_OTHER): Payer: Commercial Managed Care - HMO | Admitting: Family Medicine

## 2021-04-17 ENCOUNTER — Other Ambulatory Visit: Payer: Self-pay

## 2021-04-17 ENCOUNTER — Encounter: Payer: Self-pay | Admitting: Family Medicine

## 2021-04-17 ENCOUNTER — Encounter (INDEPENDENT_AMBULATORY_CARE_PROVIDER_SITE_OTHER): Payer: Self-pay

## 2021-04-17 ENCOUNTER — Ambulatory Visit (INDEPENDENT_AMBULATORY_CARE_PROVIDER_SITE_OTHER): Payer: Commercial Managed Care - HMO

## 2021-04-17 VITALS — BP 111/76 | HR 88 | Temp 97.8°F | Resp 16 | Ht 65.0 in | Wt 243.6 lb

## 2021-04-17 DIAGNOSIS — I428 Other cardiomyopathies: Secondary | ICD-10-CM | POA: Diagnosis not present

## 2021-04-17 DIAGNOSIS — Z130 Encounter for screening for diseases of the blood and blood-forming organs and certain disorders involving the immune mechanism: Secondary | ICD-10-CM

## 2021-04-17 DIAGNOSIS — Z0000 Encounter for general adult medical examination without abnormal findings: Secondary | ICD-10-CM

## 2021-04-17 DIAGNOSIS — Z1329 Encounter for screening for other suspected endocrine disorder: Secondary | ICD-10-CM | POA: Diagnosis not present

## 2021-04-17 DIAGNOSIS — Z136 Encounter for screening for cardiovascular disorders: Secondary | ICD-10-CM | POA: Diagnosis not present

## 2021-04-17 DIAGNOSIS — Z13220 Encounter for screening for lipoid disorders: Secondary | ICD-10-CM

## 2021-04-17 DIAGNOSIS — Z23 Encounter for immunization: Secondary | ICD-10-CM | POA: Diagnosis not present

## 2021-04-17 DIAGNOSIS — Z7689 Persons encountering health services in other specified circumstances: Secondary | ICD-10-CM

## 2021-04-17 DIAGNOSIS — Z13228 Encounter for screening for other metabolic disorders: Secondary | ICD-10-CM | POA: Diagnosis not present

## 2021-04-17 LAB — CUP PACEART REMOTE DEVICE CHECK
Battery Remaining Longevity: 49 mo
Battery Remaining Percentage: 46 %
Battery Voltage: 2.95 V
Brady Statistic RV Percent Paced: 1 %
Date Time Interrogation Session: 20230104081711
HighPow Impedance: 69 Ohm
HighPow Impedance: 69 Ohm
Implantable Lead Implant Date: 20161007
Implantable Lead Location: 753860
Implantable Lead Model: 181
Implantable Lead Serial Number: 331179
Implantable Pulse Generator Implant Date: 20161007
Lead Channel Impedance Value: 390 Ohm
Lead Channel Pacing Threshold Amplitude: 0.75 V
Lead Channel Pacing Threshold Pulse Width: 0.5 ms
Lead Channel Sensing Intrinsic Amplitude: 10.8 mV
Lead Channel Setting Pacing Amplitude: 2.5 V
Lead Channel Setting Pacing Pulse Width: 0.5 ms
Lead Channel Setting Sensing Sensitivity: 0.5 mV
Pulse Gen Serial Number: 7286706

## 2021-04-17 NOTE — Progress Notes (Signed)
New Patient Office Visit  Subjective:  Patient ID: Kevin Cardenas, male    DOB: June 06, 1975  Age: 46 y.o. MRN: [id number]  CC:  Chief Complaint  Patient presents with   Follow-up   Establish Care    HPI DMARCO BALDUS presents for to establish care and for routine annual exam. Patient denies acute complaints or concerns.   Past Medical History:  Diagnosis Date   Chronic systolic CHF (congestive heart failure) (HCC)    EF 25% by echo 06/2018   Diabetes mellitus    Hypertension    Non-ischemic cardiomyopathy ([place])    Obstructive sleep apnea    VSD (ventricular septal defect) 1978   repaired at age 37    Past Surgical History:  Procedure Laterality Date   EP IMPLANTABLE DEVICE N/A 01/19/2015   Procedure: ICD Implant;  Surgeon: Deboraha Sprang, MD;  Location: [facility];  Service: Cardiovascular;  Laterality: N/A;   RIGHT HEART CATHETERIZATION N/A 08/03/2014   Procedure: RIGHT HEART CATH;  Surgeon: Larey Dresser, MD;  Location: [facility];  Service: Cardiovascular;  Laterality: N/A;   VSD REPAIR      Family History  Problem Relation Age of Onset   Lung cancer Father    Stroke Father    Stroke Mother    Diabetes Maternal Grandfather    Diabetes Paternal Grandmother    Cancer Neg Hx    Alcohol abuse Neg Hx    Drug abuse Neg Hx    Early death Neg Hx    Heart disease Neg Hx    Hyperlipidemia Neg Hx    Hypertension Neg Hx    Kidney disease Neg Hx     Social History   Socioeconomic History   Marital status: Single    Spouse name: Recently Married    Number of children: 0   Years of education: Not on file   Highest education level: Not on file  Occupational History   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   Tobacco Use   Smoking status: Former    Packs/day: 0.30    Years: 15.00    Pack years: 4.50    Types: Cigarettes    Quit date: 04/08/2011    Years since quitting: 10.0   Smokeless tobacco:  Never   Tobacco comments:    Smokes depends on who is around him.    Substance and Sexual Activity   Alcohol use: No    Alcohol/week: 0.0 standard drinks    Comment: Rarely    Drug use: No   Sexual activity: Not Currently  Other Topics Concern   Not on file  Social History Narrative   Caffienated drinks-no   Seat belt use often-no   Regular Exercise-   Smoke alarm in the home-yes   Firearms/guns in the home-no   History of physical abuse-no               Social Determinants of Health   Financial Resource Strain: Not on file  Food Insecurity: Not on file  Transportation Needs: Not on file  Physical Activity: Not on file  Stress: Not on file  Social Connections: Not on file  Intimate Partner Violence: Not on file    ROS Review of Systems  All other systems reviewed and are negative.  Objective:   Today's Vitals: BP 111/76    Pulse 88    Temp 97.8 F (36.6 C) (  Oral)    Resp 16    Ht 5' 5" (1.651 m)    Wt 243 lb 9.6 oz (110.5 kg)    SpO2 94%    BMI 40.54 kg/m   Physical Exam Vitals and nursing note reviewed.  Constitutional:      General: He is not in acute distress.    Appearance: He is obese.  HENT:     Head: Normocephalic and atraumatic.     Right Ear: Tympanic membrane, ear canal and external ear normal.     Left Ear: Tympanic membrane, ear canal and external ear normal.     Nose: Nose normal.     Mouth/Throat:     Mouth: Mucous membranes are moist.     Pharynx: Oropharynx is clear.  Eyes:     Conjunctiva/sclera: Conjunctivae normal.     Pupils: Pupils are equal, round, and reactive to light.  Neck:     Thyroid: No thyromegaly.  Cardiovascular:     Rate and Rhythm: Normal rate and regular rhythm.     Heart sounds: Normal heart sounds. No murmur heard. Pulmonary:     Effort: Pulmonary effort is normal.     Breath sounds: Normal breath sounds.  Abdominal:     General: There is no distension.     Palpations: Abdomen is soft. There is no mass.      Tenderness: There is no abdominal tenderness.     Hernia: There is no hernia in the left inguinal area or right inguinal area.  Genitourinary:    Penis: Normal and circumcised.      Testes: Normal.  Musculoskeletal:        General: Normal range of motion.     Cervical back: Normal range of motion and neck supple.     Right lower leg: No edema.     Left lower leg: No edema.  Skin:    General: Skin is warm and dry.  Neurological:     General: No focal deficit present.     Mental Status: He is alert and oriented to person, place, and time. Mental status is at baseline.  Psychiatric:        Mood and Affect: Mood normal.        Behavior: Behavior normal.    Assessment & Plan:   1. Visit for well man health check Routine labs ordered - CMP14+EGFR - CBC with Differential - Lipid Panel  2. Screening for endocrine/metabolic/immunity disorders  - Hemoglobin A1c  3. Encounter for lipid screening for cardiovascular disease  - Lipid Panel  4. Encounter to establish care    Outpatient Encounter Medications as of 04/17/2021  Medication Sig   ascorbic acid (VITAMIN C) 500 MG tablet Take 500-1,000 mg by mouth daily.   aspirin 81 MG chewable tablet Chew 1 tablet (81 mg total) by mouth daily.   carvedilol (COREG) 25 MG tablet Take 1 tablet (25 mg total) by mouth 2 (two) times daily with a meal.   dapagliflozin propanediol (FARXIGA) 10 MG TABS tablet TAKE 1 TABLET (10 MG TOTAL) BY MOUTH DAILY BEFORE BREAKFAST.   digoxin (LANOXIN) 0.125 MG tablet Take 1 tablet (125 mcg total) by mouth daily.   fluticasone furoate-vilanterol (BREO ELLIPTA) 200-25 MCG/INH AEPB Inhale 1 puff into the lungs daily.   isosorbide-hydrALAZINE (BIDIL) 20-37.5 MG tablet Take 1 tablet by mouth 3 (three) times daily.   potassium chloride SA (KLOR-CON) 20 MEQ tablet Take 1 tablet (20 mEq total) by mouth daily.   sacubitril-valsartan (ENTRESTO) 97-103  MG Take 1 tablet by mouth 2 (two) times daily.   simvastatin (ZOCOR)  20 MG tablet Take 1 tablet (20 mg total) by mouth at bedtime.   spironolactone (ALDACTONE) 25 MG tablet Take 1 tablet (25 mg total) by mouth daily.   torsemide (DEMADEX) 20 MG tablet Take 1 tablet (20 mg total) by mouth 2 (two) times daily.   No facility-administered encounter medications on file as of 04/17/2021.    Follow-up: No follow-ups on file.   Becky Sax, MD

## 2021-04-18 ENCOUNTER — Encounter: Payer: Self-pay | Admitting: Family Medicine

## 2021-04-18 LAB — CMP14+EGFR
ALT: 19 IU/L (ref 0–44)
AST: 18 IU/L (ref 0–40)
Albumin/Globulin Ratio: 2.1 (ref 1.2–2.2)
Albumin: 4.6 g/dL (ref 4.0–5.0)
Alkaline Phosphatase: 66 IU/L (ref 44–121)
BUN/Creatinine Ratio: 13 (ref 9–20)
BUN: 14 mg/dL (ref 6–24)
Bilirubin Total: 0.3 mg/dL (ref 0.0–1.2)
CO2: 29 mmol/L (ref 20–29)
Calcium: 8.5 mg/dL — ABNORMAL LOW (ref 8.7–10.2)
Chloride: 101 mmol/L (ref 96–106)
Creatinine, Ser: 1.09 mg/dL (ref 0.76–1.27)
Globulin, Total: 2.2 g/dL (ref 1.5–4.5)
Glucose: 107 mg/dL — ABNORMAL HIGH (ref 70–99)
Potassium: 4.4 mmol/L (ref 3.5–5.2)
Sodium: 143 mmol/L (ref 134–144)
Total Protein: 6.8 g/dL (ref 6.0–8.5)
eGFR: 85 mL/min/{1.73_m2} (ref >59)

## 2021-04-18 LAB — LIPID PANEL
Chol/HDL Ratio: 3 ratio (ref 0.0–5.0)
Cholesterol, Total: 128 mg/dL (ref 100–199)
HDL: 43 mg/dL (ref >39)
LDL Chol Calc (NIH): 74 mg/dL (ref 0–99)
Triglycerides: 45 mg/dL (ref 0–149)
VLDL Cholesterol Cal: 11 mg/dL (ref 5–40)

## 2021-04-18 LAB — CBC WITH DIFFERENTIAL/PLATELET
Basophils Absolute: 0 10*3/uL (ref 0.0–0.2)
Basos: 1 %
EOS (ABSOLUTE): 0.1 10*3/uL (ref 0.0–0.4)
Eos: 2 %
Hematocrit: 41.7 % (ref 37.5–51.0)
Hemoglobin: 13.8 g/dL (ref 13.0–17.7)
Immature Grans (Abs): 0 10*3/uL (ref 0.0–0.1)
Immature Granulocytes: 0 %
Lymphocytes Absolute: 1 10*3/uL (ref 0.7–3.1)
Lymphs: 25 %
MCH: 32.2 pg (ref 26.6–33.0)
MCHC: 33.1 g/dL (ref 31.5–35.7)
MCV: 97 fL (ref 79–97)
Monocytes Absolute: 0.5 10*3/uL (ref 0.1–0.9)
Monocytes: 12 %
Neutrophils Absolute: 2.4 10*3/uL (ref 1.4–7.0)
Neutrophils: 60 %
Platelets: 142 10*3/uL — ABNORMAL LOW (ref 150–450)
RBC: 4.29 x10E6/uL (ref 4.14–5.80)
RDW: 12.7 % (ref 11.6–15.4)
WBC: 4 10*3/uL (ref 3.4–10.8)

## 2021-04-18 LAB — HEMOGLOBIN A1C
Est. average glucose Bld gHb Est-mCnc: 137 mg/dL
Hgb A1c MFr Bld: 6.4 % — ABNORMAL HIGH (ref 4.8–5.6)

## 2021-04-26 NOTE — Progress Notes (Signed)
Remote ICD transmission.

## 2021-05-20 ENCOUNTER — Other Ambulatory Visit (HOSPITAL_COMMUNITY): Payer: Self-pay

## 2021-05-22 ENCOUNTER — Encounter: Payer: Commercial Managed Care - HMO | Admitting: Family Medicine

## 2021-06-19 ENCOUNTER — Inpatient Hospital Stay (HOSPITAL_COMMUNITY)
Admission: EM | Admit: 2021-06-19 | Discharge: 2021-06-24 | DRG: 286 | Disposition: A | Discharge status: Home / Self Care | Payer: 59 | Attending: Cardiology | Admitting: Cardiology

## 2021-06-19 ENCOUNTER — Encounter (HOSPITAL_COMMUNITY): Payer: Self-pay | Admitting: Pediatrics

## 2021-06-19 ENCOUNTER — Inpatient Hospital Stay: Payer: Self-pay

## 2021-06-19 ENCOUNTER — Emergency Department (HOSPITAL_COMMUNITY): Payer: 59

## 2021-06-19 ENCOUNTER — Other Ambulatory Visit: Payer: Self-pay

## 2021-06-19 DIAGNOSIS — Z91138 Patient's unintentional underdosing of medication regimen for other reason: Secondary | ICD-10-CM

## 2021-06-19 DIAGNOSIS — R0602 Shortness of breath: Secondary | ICD-10-CM | POA: Diagnosis present

## 2021-06-19 DIAGNOSIS — I2723 Pulmonary hypertension due to lung diseases and hypoxia: Secondary | ICD-10-CM | POA: Diagnosis present

## 2021-06-19 DIAGNOSIS — Z79899 Other long term (current) drug therapy: Secondary | ICD-10-CM | POA: Diagnosis not present

## 2021-06-19 DIAGNOSIS — Z833 Family history of diabetes mellitus: Secondary | ICD-10-CM | POA: Diagnosis not present

## 2021-06-19 DIAGNOSIS — Z8774 Personal history of (corrected) congenital malformations of heart and circulatory system: Secondary | ICD-10-CM

## 2021-06-19 DIAGNOSIS — I110 Hypertensive heart disease with heart failure: Principal | ICD-10-CM | POA: Diagnosis present

## 2021-06-19 DIAGNOSIS — E662 Morbid (severe) obesity with alveolar hypoventilation: Secondary | ICD-10-CM | POA: Diagnosis present

## 2021-06-19 DIAGNOSIS — N179 Acute kidney failure, unspecified: Secondary | ICD-10-CM | POA: Diagnosis present

## 2021-06-19 DIAGNOSIS — Z823 Family history of stroke: Secondary | ICD-10-CM | POA: Diagnosis not present

## 2021-06-19 DIAGNOSIS — I428 Other cardiomyopathies: Secondary | ICD-10-CM | POA: Diagnosis present

## 2021-06-19 DIAGNOSIS — R230 Cyanosis: Secondary | ICD-10-CM | POA: Diagnosis present

## 2021-06-19 DIAGNOSIS — Z888 Allergy status to other drugs, medicaments and biological substances status: Secondary | ICD-10-CM

## 2021-06-19 DIAGNOSIS — J9622 Acute and chronic respiratory failure with hypercapnia: Secondary | ICD-10-CM | POA: Diagnosis present

## 2021-06-19 DIAGNOSIS — E8770 Fluid overload, unspecified: Secondary | ICD-10-CM

## 2021-06-19 DIAGNOSIS — I5023 Acute on chronic systolic (congestive) heart failure: Secondary | ICD-10-CM | POA: Diagnosis present

## 2021-06-19 DIAGNOSIS — R0902 Hypoxemia: Principal | ICD-10-CM

## 2021-06-19 DIAGNOSIS — E875 Hyperkalemia: Secondary | ICD-10-CM | POA: Diagnosis present

## 2021-06-19 DIAGNOSIS — I493 Ventricular premature depolarization: Secondary | ICD-10-CM | POA: Diagnosis present

## 2021-06-19 DIAGNOSIS — I5043 Acute on chronic combined systolic (congestive) and diastolic (congestive) heart failure: Secondary | ICD-10-CM | POA: Diagnosis not present

## 2021-06-19 DIAGNOSIS — Z006 Encounter for examination for normal comparison and control in clinical research program: Secondary | ICD-10-CM | POA: Diagnosis not present

## 2021-06-19 DIAGNOSIS — T500X6A Underdosing of mineralocorticoids and their antagonists, initial encounter: Secondary | ICD-10-CM | POA: Diagnosis present

## 2021-06-19 DIAGNOSIS — Z801 Family history of malignant neoplasm of trachea, bronchus and lung: Secondary | ICD-10-CM

## 2021-06-19 DIAGNOSIS — Z6839 Body mass index (BMI) 39.0-39.9, adult: Secondary | ICD-10-CM | POA: Diagnosis not present

## 2021-06-19 DIAGNOSIS — J9621 Acute and chronic respiratory failure with hypoxia: Secondary | ICD-10-CM | POA: Diagnosis present

## 2021-06-19 DIAGNOSIS — Z87891 Personal history of nicotine dependence: Secondary | ICD-10-CM

## 2021-06-19 DIAGNOSIS — Z20822 Contact with and (suspected) exposure to covid-19: Secondary | ICD-10-CM | POA: Diagnosis present

## 2021-06-19 DIAGNOSIS — E119 Type 2 diabetes mellitus without complications: Secondary | ICD-10-CM | POA: Diagnosis present

## 2021-06-19 DIAGNOSIS — J9690 Respiratory failure, unspecified, unspecified whether with hypoxia or hypercapnia: Secondary | ICD-10-CM

## 2021-06-19 DIAGNOSIS — Z7984 Long term (current) use of oral hypoglycemic drugs: Secondary | ICD-10-CM

## 2021-06-19 DIAGNOSIS — D696 Thrombocytopenia, unspecified: Secondary | ICD-10-CM | POA: Diagnosis present

## 2021-06-19 DIAGNOSIS — I5021 Acute systolic (congestive) heart failure: Secondary | ICD-10-CM | POA: Diagnosis not present

## 2021-06-19 DIAGNOSIS — Z7951 Long term (current) use of inhaled steroids: Secondary | ICD-10-CM

## 2021-06-19 DIAGNOSIS — Z95810 Presence of automatic (implantable) cardiac defibrillator: Secondary | ICD-10-CM

## 2021-06-19 DIAGNOSIS — Z7982 Long term (current) use of aspirin: Secondary | ICD-10-CM

## 2021-06-19 HISTORY — DX: Acute on chronic combined systolic (congestive) and diastolic (congestive) heart failure: I50.43

## 2021-06-19 LAB — RESP PANEL BY RT-PCR (FLU A&B, COVID) ARPGX2
Influenza A by PCR: NEGATIVE
Influenza B by PCR: NEGATIVE
SARS Coronavirus 2 by RT PCR: NEGATIVE

## 2021-06-19 LAB — TROPONIN I (HIGH SENSITIVITY)
Troponin I (High Sensitivity): 15 ng/L (ref <18)
Troponin I (High Sensitivity): 15 ng/L (ref <18)

## 2021-06-19 LAB — COOXEMETRY PANEL
Carboxyhemoglobin: 2.4 % — ABNORMAL HIGH (ref 0.5–1.5)
Methemoglobin: <0.7 % (ref 0.0–1.5)
O2 Saturation: 79.5 %
Total hemoglobin: 13.5 g/dL (ref 12.0–16.0)

## 2021-06-19 LAB — CBC WITH DIFFERENTIAL/PLATELET
Abs Immature Granulocytes: 0.02 10*3/uL (ref 0.00–0.07)
Basophils Absolute: 0 10*3/uL (ref 0.0–0.1)
Basophils Relative: 0 %
Eosinophils Absolute: 0 10*3/uL (ref 0.0–0.5)
Eosinophils Relative: 0 %
HCT: 45.7 % (ref 39.0–52.0)
Hemoglobin: 14.2 g/dL (ref 13.0–17.0)
Immature Granulocytes: 0 %
Lymphocytes Relative: 10 %
Lymphs Abs: 0.8 10*3/uL (ref 0.7–4.0)
MCH: 32.3 pg (ref 26.0–34.0)
MCHC: 31.1 g/dL (ref 30.0–36.0)
MCV: 103.9 fL — ABNORMAL HIGH (ref 80.0–100.0)
Monocytes Absolute: 0.6 10*3/uL (ref 0.1–1.0)
Monocytes Relative: 8 %
Neutro Abs: 6.4 10*3/uL (ref 1.7–7.7)
Neutrophils Relative %: 82 %
Platelets: 124 10*3/uL — ABNORMAL LOW (ref 150–400)
RBC: 4.4 MIL/uL (ref 4.22–5.81)
RDW: 13.3 % (ref 11.5–15.5)
WBC: 7.9 10*3/uL (ref 4.0–10.5)
nRBC: 0 % (ref 0.0–0.2)

## 2021-06-19 LAB — DIGOXIN LEVEL: Digoxin Level: 0.2 ng/mL — ABNORMAL LOW (ref 0.8–2.0)

## 2021-06-19 LAB — CBC
HCT: 44.3 % (ref 39.0–52.0)
Hemoglobin: 14 g/dL (ref 13.0–17.0)
MCH: 32.6 pg (ref 26.0–34.0)
MCHC: 31.6 g/dL (ref 30.0–36.0)
MCV: 103.3 fL — ABNORMAL HIGH (ref 80.0–100.0)
Platelets: 117 K/uL — ABNORMAL LOW (ref 150–400)
RBC: 4.29 MIL/uL (ref 4.22–5.81)
RDW: 13.2 % (ref 11.5–15.5)
WBC: 6.5 K/uL (ref 4.0–10.5)
nRBC: 0 % (ref 0.0–0.2)

## 2021-06-19 LAB — COMPREHENSIVE METABOLIC PANEL
ALT: 10 U/L (ref 0–44)
ALT: 20 U/L (ref 0–44)
AST: 35 U/L (ref 15–41)
AST: 18 U/L (ref 15–41)
Albumin: 3.5 g/dL (ref 3.5–5.0)
Albumin: 3.5 g/dL (ref 3.5–5.0)
Alkaline Phosphatase: 45 U/L (ref 38–126)
Alkaline Phosphatase: 50 U/L (ref 38–126)
Anion gap: 7 (ref 5–15)
Anion gap: 10 (ref 5–15)
BUN: 19 mg/dL (ref 6–20)
BUN: 17 mg/dL (ref 6–20)
CO2: 34 mmol/L — ABNORMAL HIGH (ref 22–32)
CO2: 34 mmol/L — ABNORMAL HIGH (ref 22–32)
Calcium: 9 mg/dL (ref 8.9–10.3)
Calcium: 8.6 mg/dL — ABNORMAL LOW (ref 8.9–10.3)
Chloride: 97 mmol/L — ABNORMAL LOW (ref 98–111)
Chloride: 96 mmol/L — ABNORMAL LOW (ref 98–111)
Creatinine, Ser: 1.48 mg/dL — ABNORMAL HIGH (ref 0.61–1.24)
Creatinine, Ser: 1.26 mg/dL — ABNORMAL HIGH (ref 0.61–1.24)
GFR, Estimated: 59 mL/min — ABNORMAL LOW (ref >60)
GFR, Estimated: >60 mL/min (ref >60)
Glucose, Bld: 159 mg/dL — ABNORMAL HIGH (ref 70–99)
Glucose, Bld: 111 mg/dL — ABNORMAL HIGH (ref 70–99)
Potassium: 5.4 mmol/L — ABNORMAL HIGH (ref 3.5–5.1)
Potassium: 4.1 mmol/L (ref 3.5–5.1)
Sodium: 138 mmol/L (ref 135–145)
Sodium: 140 mmol/L (ref 135–145)
Total Bilirubin: 2.6 mg/dL — ABNORMAL HIGH (ref 0.3–1.2)
Total Bilirubin: 0.8 mg/dL (ref 0.3–1.2)
Total Protein: 6.4 g/dL — ABNORMAL LOW (ref 6.5–8.1)
Total Protein: 6.7 g/dL (ref 6.5–8.1)

## 2021-06-19 LAB — MAGNESIUM
Magnesium: 2.6 mg/dL — ABNORMAL HIGH (ref 1.7–2.4)
Magnesium: 2.5 mg/dL — ABNORMAL HIGH (ref 1.7–2.4)

## 2021-06-19 LAB — I-STAT ARTERIAL BLOOD GAS, ED
Acid-Base Excess: 7 mmol/L — ABNORMAL HIGH (ref 0.0–2.0)
Bicarbonate: 37.8 mmol/L — ABNORMAL HIGH (ref 20.0–28.0)
Calcium, Ion: 1.23 mmol/L (ref 1.15–1.40)
HCT: 43 % (ref 39.0–52.0)
Hemoglobin: 14.6 g/dL (ref 13.0–17.0)
O2 Saturation: 92 %
Patient temperature: 98.7
Potassium: 4.1 mmol/L (ref 3.5–5.1)
Sodium: 138 mmol/L (ref 135–145)
TCO2: 40 mmol/L — ABNORMAL HIGH (ref 22–32)
pCO2 arterial: 83.1 mmHg (ref 32–48)
pH, Arterial: 7.266 — ABNORMAL LOW (ref 7.35–7.45)
pO2, Arterial: 79 mmHg — ABNORMAL LOW (ref 83–108)

## 2021-06-19 LAB — AMMONIA: Ammonia: 21 umol/L (ref 9–35)

## 2021-06-19 LAB — RAPID URINE DRUG SCREEN, HOSP PERFORMED
Amphetamines: NOT DETECTED
Barbiturates: NOT DETECTED
Benzodiazepines: NOT DETECTED
Cocaine: NOT DETECTED
Opiates: NOT DETECTED
Tetrahydrocannabinol: POSITIVE — AB

## 2021-06-19 LAB — ETHANOL: Alcohol, Ethyl (B): <10 mg/dL (ref <10)

## 2021-06-19 LAB — TSH: TSH: 0.984 u[IU]/mL (ref 0.350–4.500)

## 2021-06-19 LAB — D-DIMER, QUANTITATIVE: D-Dimer, Quant: 0.32 ug/mL-FEU (ref 0.00–0.50)

## 2021-06-19 LAB — BRAIN NATRIURETIC PEPTIDE: B Natriuretic Peptide: 235.5 pg/mL — ABNORMAL HIGH (ref 0.0–100.0)

## 2021-06-19 LAB — GLUCOSE, CAPILLARY: Glucose-Capillary: 109 mg/dL — ABNORMAL HIGH (ref 70–99)

## 2021-06-19 LAB — MRSA NEXT GEN BY PCR, NASAL: MRSA by PCR Next Gen: DETECTED — AB

## 2021-06-19 LAB — CREATININE, SERUM
Creatinine, Ser: 1.1 mg/dL (ref 0.61–1.24)
GFR, Estimated: >60 mL/min (ref >60)

## 2021-06-19 LAB — LACTIC ACID, PLASMA: Lactic Acid, Venous: 0.8 mmol/L (ref 0.5–1.9)

## 2021-06-19 LAB — LIPASE, BLOOD: Lipase: 32 U/L (ref 11–51)

## 2021-06-19 LAB — HIV ANTIBODY (ROUTINE TESTING W REFLEX): HIV Screen 4th Generation wRfx: NONREACTIVE

## 2021-06-19 MED ORDER — SODIUM CHLORIDE 0.9% FLUSH: 3.0000 mL | INTRAVENOUS | Status: DC | PRN

## 2021-06-19 MED ORDER — FUROSEMIDE 10 MG/ML IJ SOLN
60.0000 mg | Freq: Two times a day (BID) | INTRAMUSCULAR | Status: DC
  Administered 2021-06-19: 60 mg via INTRAVENOUS
  Filled 2021-06-19: qty 6

## 2021-06-19 MED ORDER — SODIUM CHLORIDE 0.9 % IV SOLN
250.0000 mL | INTRAVENOUS | Status: DC | PRN
Start: 2021-06-19 — End: 2021-06-24
  Administered 2021-06-20: 10:00:00 250 mL via INTRAVENOUS

## 2021-06-19 MED ORDER — CHLORHEXIDINE GLUCONATE 0.12 % MT SOLN
15.0000 mL | Freq: Two times a day (BID) | OROMUCOSAL | Status: DC
  Administered 2021-06-19 – 2021-06-24 (×8): 15 mL via OROMUCOSAL
  Filled 2021-06-19 (×8): qty 15

## 2021-06-19 MED ORDER — ORAL CARE MOUTH RINSE
15.0000 mL | Freq: Two times a day (BID) | OROMUCOSAL | Status: DC
  Administered 2021-06-21 – 2021-06-22 (×4): 15 mL via OROMUCOSAL

## 2021-06-19 MED ORDER — SODIUM CHLORIDE 0.9% FLUSH
10.0000 mL | Freq: Two times a day (BID) | INTRAVENOUS | Status: DC
  Administered 2021-06-19 – 2021-06-24 (×9): 10 mL

## 2021-06-19 MED ORDER — HEPARIN SODIUM (PORCINE) 5000 UNIT/ML IJ SOLN
5000.0000 [IU] | Freq: Three times a day (TID) | INTRAMUSCULAR | Status: DC
  Administered 2021-06-19 – 2021-06-21 (×6): 5000 [IU] via SUBCUTANEOUS
  Filled 2021-06-19 (×5): qty 1

## 2021-06-19 MED ORDER — MUPIROCIN 2 % EX OINT
1.0000 "application " | TOPICAL_OINTMENT | Freq: Two times a day (BID) | CUTANEOUS | Status: DC
  Administered 2021-06-19 – 2021-06-24 (×9): 1 via NASAL
  Filled 2021-06-19 (×2): qty 22

## 2021-06-19 MED ORDER — DIGOXIN 125 MCG PO TABS
125.0000 ug | ORAL_TABLET | Freq: Every day | ORAL | Status: DC
  Administered 2021-06-19 – 2021-06-24 (×6): 125 ug via ORAL
  Filled 2021-06-19 (×6): qty 1

## 2021-06-19 MED ORDER — ACETAMINOPHEN 325 MG PO TABS: 650.0000 mg | ORAL_TABLET | ORAL | Status: DC | PRN

## 2021-06-19 MED ORDER — CHLORHEXIDINE GLUCONATE CLOTH 2 % EX PADS
6.0000 | MEDICATED_PAD | Freq: Every day | CUTANEOUS | Status: DC
  Administered 2021-06-24: 6 via TOPICAL

## 2021-06-19 MED ORDER — DAPAGLIFLOZIN PROPANEDIOL 10 MG PO TABS
10.0000 mg | ORAL_TABLET | Freq: Every day | ORAL | Status: DC
  Administered 2021-06-19 – 2021-06-24 (×6): 10 mg via ORAL
  Filled 2021-06-19 (×6): qty 1

## 2021-06-19 MED ORDER — CHLORHEXIDINE GLUCONATE CLOTH 2 % EX PADS: 6.0000 | MEDICATED_PAD | Freq: Every day | CUTANEOUS | Status: DC

## 2021-06-19 MED ORDER — SODIUM CHLORIDE 0.9% FLUSH: 10.0000 mL | INTRAVENOUS | Status: DC | PRN

## 2021-06-19 MED ORDER — ASPIRIN 81 MG PO CHEW
81.0000 mg | CHEWABLE_TABLET | Freq: Every day | ORAL | Status: DC
  Administered 2021-06-20 – 2021-06-23 (×4): 81 mg via ORAL
  Filled 2021-06-19 (×5): qty 1

## 2021-06-19 MED ORDER — SODIUM CHLORIDE 0.9% FLUSH
3.0000 mL | Freq: Two times a day (BID) | INTRAVENOUS | Status: DC
Start: 2021-06-19 — End: 2021-06-19
  Administered 2021-06-19: 3 mL via INTRAVENOUS

## 2021-06-19 NOTE — Progress Notes (Signed)
Patient reported his weight is normally around 230s lbs at home ?

## 2021-06-19 NOTE — ED Notes (Signed)
RT called to notify patient ready for transport to [place]. ?

## 2021-06-19 NOTE — H&P (Addendum)
Advanced Heart Failure Team History and Physical Note   PCP:  Dorna Mai, MD  PCP-Cardiology: [name], MD     Reason for Admission: Acute on Chronic Systolic Heart Failure   HPI:    Kevin Cardenas is seen today for evaluation of acute on chronic systolic heart failure at the request of Dr. Sherry Ruffing, Emergency Medicine.    Kevin Cardenas is a 46 y.o. male who has a history of VSD repair and removal of a pulmonary artery band at age 31, DM, HTN, systolic heart failure, NICM and OSA diagnosed in past but not on CPAP. Has St Jude ICD (placed 01/2015).    Echo in 3/20 showed EF 25%, mild LV dilation, mildly decreased RV systolic function.    CPX in 10/20 showed moderate functional limitation primarily due to restrictive lung disease. Subsequent high resolution chest CT 8/21 showed no evidence of interstitial lung disease. PFTs were ordered but he never completed.    Echo in 8/21 showed EF 30-35%, no significant residual VSD, normal RV.   He presented to the ED today w/ complaint of dyspnea and noted to be profoundly hypoxic. On arrival O2 sats were reported in the 3s and pt was cyanotic. He was placed on NRB w/ improvement, transitioned to HFNC, currently at 6L/min, O2 sats now at 97%.   He is a very poor historian. Says he has been short of breath walking but unable to tell me how long this has been going on or when exactly symptoms worsened. Says his weight has been going "up and down" but unable to tell me his weight. He reports full med compliance but ? His truthfulness. He does admit to poor compliance w/ CPAP. Denies CP.    In ED, Respiratory panel negative. D-dimer negative. BNP mildly elevated 235.5. HS trop 15>>15. Lactic acid 0.8. Na 138. AKI w/ SCr elevated at 1.5 (baseline ~1.1), K 5.4, CO2 34, AST and ALT normal but total bili elevated at 2.6. H/H normal. Plts low at 124K, appears chronic thrombocytopenia. MCV elevated at 104. TSH normal.    EKG NSR w/ Nonspecific  IVCD with LAD, QRS 125 ms. BP normotensive    CXR shows cardiomegaly w/ pulmonary vascular congestion w/ diffuse b/l interstitial opacities, suggestive of CHF w/ pulmonary edema. WBC normal. AF. Denies cough.    He is currently comfortable at rest but remains on 6L [place]. Drowsy on exam but easily arousable and able to respond to questions. Oriented x 3.   Suspect he is > 10 lb fluid overloaded.    Review of Systems: [y] = yes, _0  = no   General: Weight gain _1 ; Weight loss _2 ; Anorexia _3 ; Fatigue [Y ]; Fever _4 ; Chills _5 ; Weakness _6   Cardiac: Chest pain/pressure _7 ; Resting SOB [ Y]; Exertional SOB [ Y]; Orthopnea [Y ]; Pedal Edema _8 ; Palpitations _9 ; Syncope _10 ; Presyncope _11 ; Paroxysmal nocturnal dyspnea_12   Pulmonary: Cough _13 ; Wheezing_14 ; Hemoptysis_15 ; Sputum _16 ; Snoring _17   GI: Vomiting_18 ; Dysphagia_19 ; Melena_20 ; Hematochezia _21 ; Heartburn_22 ; Abdominal pain _23 ; Constipation _24 ; Diarrhea _25 ; BRBPR _26   GU: Hematuria_27 ; Dysuria _28 ; Nocturia_29   Vascular: Pain in legs with walking _30 ; Pain in feet with lying flat _31 ; Non-healing sores _32 ; Stroke _33 ; TIA _34 ; Slurred speech _35 ;  Neuro: Headaches_36 ; Vertigo_37 ; Seizures_38 ; Paresthesias_39 ;  Blurred vision _0 ; Diplopia _1 ; Vision changes _2   Ortho/Skin: Arthritis _3 ; Joint pain _4 ; Muscle pain _5 ; Joint swelling _6 ; Back Pain _7 ; Rash _8   Psych: Depression_9 ; Anxiety_10   Heme: Bleeding problems _11 ; Clotting disorders _12 ; Anemia _13   Endocrine: Diabetes _14 ; Thyroid dysfunction_15    Home Medications Prior to Admission medications   Medication Sig Start Date End Date Taking? Authorizing Provider  ascorbic acid (VITAMIN C) 500 MG tablet Take 500-1,000 mg by mouth daily.    [provider]  aspirin 81 MG chewable tablet Chew 1 tablet (81 mg total) by mouth daily. 04/11/12   Ripley Fraise, MD  carvedilol (COREG) 25 MG tablet Take 1 tablet (25 mg total) by mouth 2 (two) times daily with a  meal. 09/14/20   Larey Dresser, MD  dapagliflozin propanediol (FARXIGA) 10 MG TABS tablet TAKE 1 TABLET (10 MG TOTAL) BY MOUTH DAILY BEFORE BREAKFAST. 03/01/21   Larey Dresser, MD  digoxin (LANOXIN) 0.125 MG tablet Take 1 tablet (125 mcg total) by mouth daily. 09/14/20   Larey Dresser, MD  fluticasone furoate-vilanterol (BREO ELLIPTA) 200-25 MCG/INH AEPB Inhale 1 puff into the lungs daily. 08/05/17   Mete Purdum, Shaune Pascal, MD  isosorbide-hydrALAZINE (BIDIL) 20-37.5 MG tablet Take 1 tablet by mouth 3 (three) times daily. 12/18/20   Larey Dresser, MD  potassium chloride SA (KLOR-CON) 20 MEQ tablet Take 1 tablet (20 mEq total) by mouth daily. 09/14/20   Larey Dresser, MD  sacubitril-valsartan (ENTRESTO) 97-103 MG Take 1 tablet by mouth 2 (two) times daily. 03/01/21   Larey Dresser, MD  simvastatin (ZOCOR) 20 MG tablet Take 1 tablet (20 mg total) by mouth at bedtime. 03/01/21   Larey Dresser, MD  spironolactone (ALDACTONE) 25 MG tablet Take 1 tablet (25 mg total) by mouth daily. 09/14/20   Larey Dresser, MD  torsemide (DEMADEX) 20 MG tablet Take 1 tablet (20 mg total) by mouth 2 (two) times daily. 03/01/21   Larey Dresser, MD    Past Medical History: Past Medical History:  Diagnosis Date   Chronic systolic CHF (congestive heart failure) ([place])    EF 25% by echo 06/2018   Diabetes mellitus    Hypertension    Non-ischemic cardiomyopathy ([place])    Obstructive sleep apnea    VSD (ventricular septal defect) 1978   repaired at age 37    Past Surgical History: Past Surgical History:  Procedure Laterality Date   EP IMPLANTABLE DEVICE N/A 01/19/2015   Procedure: ICD Implant;  Surgeon: Deboraha Sprang, MD;  Location: [facility];  Service: Cardiovascular;  Laterality: N/A;   RIGHT HEART CATHETERIZATION N/A 08/03/2014   Procedure: RIGHT HEART CATH;  Surgeon: Larey Dresser, MD;  Location: [facility] CATH [facility];  Service: Cardiovascular;  Laterality: N/A;   VSD REPAIR      Family History:   Family History  Problem Relation Age of Onset   Lung cancer Father    Stroke Father    Stroke Mother    Diabetes Maternal Grandfather    Diabetes Paternal Grandmother    Cancer Neg Hx    Alcohol abuse Neg Hx    Drug abuse Neg Hx    Early death Neg Hx    Heart disease Neg Hx    Hyperlipidemia Neg Hx    Hypertension Neg Hx    Kidney disease Neg Hx     Social  History: Social History   Socioeconomic History   Marital status: Single    Spouse name: Recently Married    Number of children: 0   Years of education: Not on file   Highest education level: Not on file  Occupational History   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   Tobacco Use   Smoking status: Former    Packs/day: 0.30    Years: 15.00    Pack years: 4.50    Types: Cigarettes    Quit date: 04/08/2011    Years since quitting: 10.2   Smokeless tobacco: Never   Tobacco comments:    Smokes depends on who is around him.    Substance and Sexual Activity   Alcohol use: No    Alcohol/week: 0.0 standard drinks    Comment: Rarely    Drug use: No   Sexual activity: Not Currently  Other Topics Concern   Not on file  Social History Narrative   Caffienated drinks-no   Seat belt use often-no   Regular Exercise-   Smoke alarm in the home-yes   Firearms/guns in the home-no   History of physical abuse-no               Social Determinants of Health   Financial Resource Strain: Not on file  Food Insecurity: Not on file  Transportation Needs: Not on file  Physical Activity: Not on file  Stress: Not on file  Social Connections: Not on file    Allergies:  Allergies  Allergen Reactions   Metolazone Other (See Comments)    "Dried up my kidneys- took only one tablet"    Objective:    Vital Signs:   Temp:  [98.7 F (37.1 C)] 98.7 F (37.1 C) (03/08 0945) Pulse Rate:  [64-91] 65 (03/08 1330) Resp:  [12-20] 15 (03/08 1330) BP: (100-118)/(62-76) 107/75  (03/08 1330) SpO2:  [88 %-98 %] 96 % (03/08 1330) Weight:  [108.9 kg] 108.9 kg (03/08 0947)   Filed Weights   06/19/21 0947  Weight: 108.9 kg     Physical Exam     General:  drowsy appearing, responds to questions. On HF [place] but no increased WOB HEENT: Normal Neck: Supple. Thick neck, JVD elevated to jaw. Carotids 2+ bilat; no bruits. No lymphadenopathy or thyromegaly appreciated. Cor: PMI nondisplaced. Regular rate & rhythm. No rubs, gallops or murmurs. Lungs: quite BS at the bases bilaterally, no wheezing  Abdomen: obese/ distended, nontender, No hepatosplenomegaly. No bruits or masses. Good bowel sounds. Extremities: No cyanosis, clubbing, rash, trace b/l pretibial edema, distal extremities slightly cool  Neuro: drowsy but easily awakes and responds to questions and commands.    Telemetry   NSR 90s, occasional PVCs   EKG   NSR 98 bpm w/ Nonspecific IVCD with LAD, QRS 125 ms  Labs     Basic Metabolic Panel: Recent Labs  Lab 06/19/21 0957  NA 138  K 5.4*  CL 97*  CO2 34*  GLUCOSE 159*  BUN 19  CREATININE 1.48*  CALCIUM 9.0  MG 2.6*    Liver Function Tests: Recent Labs  Lab 06/19/21 0957  AST 35  ALT 10  ALKPHOS 45  BILITOT 2.6*  PROT 6.4*  ALBUMIN 3.5   Recent Labs  Lab 06/19/21 0957  LIPASE 32   No results for input(s): AMMONIA in the last 168 hours.  CBC: Recent Labs  Lab 06/19/21 0957  WBC 7.9  NEUTROABS 6.4  HGB  14.2  HCT 45.7  MCV 103.9*  PLT 124*    Cardiac Enzymes: No results for input(s): CKTOTAL, CKMB, CKMBINDEX, TROPONINI in the last 168 hours.  BNP: BNP (last 3 results) Recent Labs    06/19/21 0957  BNP 235.5*    ProBNP (last 3 results) No results for input(s): PROBNP in the last 8760 hours.   CBG: No results for input(s): GLUCAP in the last 168 hours.  Coagulation Studies: No results for input(s): LABPROT, INR in the last 72 hours.  Imaging: DG Chest Portable 1 View  Result Date: 06/19/2021 CLINICAL DATA:   Chest pain, shortness of breath EXAM: PORTABLE CHEST 1 VIEW COMPARISON:  11/04/2019 FINDINGS: Single lead left-sided implanted cardiac device. Stable cardiomegaly. Pulmonary vascular congestion with diffuse bilateral interstitial opacities. No large pleural fluid collection. No pneumothorax. IMPRESSION: Cardiomegaly with pulmonary vascular congestion and diffuse bilateral interstitial opacities, suggestive of CHF with pulmonary edema. Electronically Signed   By: Davina Poke D.O.   On: 06/19/2021 10:20     Patient Profile   46 y.o. male who has a history of VSD repair and removal of a pulmonary artery band at age 50, DM, HTN, systolic heart failure, NICM and OSA w/ poor compliance w/ CPAP, admitted for acute hypoxic respiratory failure in setting of acute on chronic systolic heart failure and AKI.   Assessment/Plan   1. Acute Hypoxic/Hyercarbic Respiratory Failure - O2 sats reportedly in the 30s on arrival and cyanotic, improved on NRB - now on HFNC, 6L/min, sating in upper 90s - COVID/Flu negative, H/H normal   - D-dimer negative, PE unlikely  - CXR w/ pulmonary vascular congestion/ pulmonary edema  - Start IV Lasix - CO2 elevated at 34. Drowsy on exam. Suspect poor compliance w/ CPAP - Check ABG - May need Diamox - Order CPAP  - check UDS,  ETOH level and NH3   2. Acute on Chronic Systolic Heart Failure - Nonischemic cardiomyopathy.  RHC in 4/16 showed preserved cardiac index by thermodilution but low by Fick.   - Last echo in 8/21 showed EF 30-35%, normal RV, no residual VSD, no definite pulmonic stenosis.   - Now w/ NYHA Class III-IIIb symptoms. Volume assessment difficult given body habitus, but appears fluid overloaded. BNP only mildly elevated at 235.5, but suspect falsely low in setting of obesity. CXR shows pulmonary edema. Med compliance has been questionable. - will admit for IV diuresis, start Lasix 60 mg bid  - place PICC line to monitor CVPs to guide diuresis and check  Co-ox to r/o low output given AKI  - repeat echo  - hold home Entresto and Arlyce Harman given AKI and hyperkalemia  - hold Coreg for now given concern for possible low output and volume overload - continue Farxiga 10 mg  - hold Bidil w/ soft BP  - continue digoxin 0.125 (dig level ok, 0.2) - May need repeat RHC prior to d/c  - Has St Jude ICD. Not candidate for CRT-D upgrade w/ narrow QRS 125 ms   3. AKI  - Baseline SCr 1.1 - 1.5 on admit, in setting of HF. ? Low output/cardiorenal  - diurese w/ IV Lasix - place PICC to check co-ox, if low will need inotropic support  - follow BMP   4. Hyperkalemia - K 5.4 on admit, in setting of AKI  - Start IV Lasix - Hold Arlyce Harman and Delene Loll for now - Repeat BMP this evening, give Margit Banda if still elevated   5. OSA - needs to improve compliance  w/ CPAP  - order CPAP QHS   7. Thrombocytopenia - plts 124 - appears chronic, prior baseline ~140K  - monitor   8. Congenital Heart Disease - VSD repair and removal of a pulmonary artery band at age 31 - echo 8/21 showed no significant residual VSD, normal RV - repeat echo   9. Type 2DM  - SSI - continue Farxiga 10 mg  - check A1c   10. Poor Med Compliance - would benefit from paramedicine at d/c - will engage HF SW team    Lyda Jester, PA-C 06/19/2021, 1:49 PM  Advanced Heart Failure Team Pager [phone] (M-F; 7a - 5p)  Please contact [facility] for night-coverage after hours (4p -7a ) and weekends on [email or web address]  Patient seen and examined with the above-signed Advanced Practice Provider and/or Housestaff. I personally reviewed laboratory data, imaging studies and relevant notes. I independently examined the patient and formulated the important aspects of the plan. I have edited the note to reflect any of my changes or salient points. I have personally discussed the plan with the patient and/or family.  46 y/o male as above with DM2, OSA, VSD repair and chronic systolic HF due to NICM  EF ~25%  Presents to ER with worsening HF symptoms over past 1-2 weeks with severe LE edema, orthopne and PND. Required Bipap in ER. Thinks he gained at least 15 pounds.   General:  Obese male sitting up in bed Now off bipap HEENT: normal Neck: supple. JVP to ear . Carotids 2+ bilat; no bruits. No lymphadenopathy or thryomegaly appreciated. Cor: PMI nondisplaced. Regular rate & rhythm. N2/6 TR Lungs: decreased in bases R>L Abdomen: soft, nontender, + distended. No hepatosplenomegaly. No bruits or masses. Good bowel sounds. Extremities: no cyanosis, clubbing, rash, 2-3+ edema into thighs Neuro: alert & orientedx3, cranial nerves grossly intact. moves all 4 extremities w/o difficulty. Affect pleasant  He has marked volume overload. Suspect close to 20 pounds if not more. Will start IV lasix. If not responding would place PICC to follow CVP and co-ox. Repeat echo. Wrap legs.   He has been approached by our research team for participation in [place] trial and is interested in proceeding. I explained the trial to him and his wife.   Glori Bickers, MD  9:31 PM

## 2021-06-19 NOTE — ED Notes (Addendum)
This RN attempted to call report to [facility], receiving nurse on [place]. Per Financial controller, nurse is not answering phone. Name and number of this RN left for callback.  ?

## 2021-06-19 NOTE — Progress Notes (Signed)
Peripherally Inserted Central Catheter Placement ? ?The IV Nurse has discussed with the patient and/or persons authorized to consent for the patient, the purpose of this procedure and the potential benefits and risks involved with this procedure.  The benefits include less needle sticks, lab draws from the catheter, and the patient may be discharged home with the catheter. Risks include, but not limited to, infection, bleeding, blood clot (thrombus formation), and puncture of an artery; nerve damage and irregular heartbeat and possibility to perform a PICC exchange if needed/ordered by physician.  Alternatives to this procedure were also discussed.  Bard Power PICC patient education guide, fact sheet on infection prevention and patient information card has been provided to patient /or left at bedside.   ? ?PICC Placement Documentation  ?PICC Double Lumen 30/14/15 Right Basilic 39 cm 0 cm (Active)  ?Indication for Insertion or Continuance of Line Chronic illness with exacerbations (CF, Sickle Cell, etc.) 06/19/21 2243  ?Exposed Catheter (cm) 0 cm 06/19/21 2243  ?Site Assessment Clean, Dry, Intact 06/19/21 2243  ?Lumen #1 Status Saline locked;Flushed;Blood return noted 06/19/21 2243  ?Lumen #2 Status Saline locked;Flushed;Blood return noted 06/19/21 2243  ?Dressing Type Securing device;Transparent 06/19/21 2243  ?Dressing Status Antimicrobial disc in place;Clean, Dry, Intact 06/19/21 2243  ?Safety Lock Not Applicable 97/33/12 5087  ?Line Care Connections checked and tightened 06/19/21 2243  ?Line Adjustment (NICU/IV Team Only) No 06/19/21 2243  ?Dressing Intervention New dressing 06/19/21 2243  ?Dressing Change Due 06/26/21 06/19/21 2243  ? ? ? ? ? ?Rosalio Macadamia Chenice ?06/19/2021, 10:44 PM ? ?

## 2021-06-19 NOTE — Progress Notes (Signed)
Patient transferred to 2C09 via BIPAP without incidence. Report given to receiving RT. ?

## 2021-06-19 NOTE — Plan of Care (Signed)
?  Problem: Education: ?Goal: Knowledge of General Education information will improve ?Description: Including pain rating scale, medication(s)/side effects and non-pharmacologic comfort measures ?Outcome: Progressing ?  ?Problem: Activity: ?Goal: Capacity to carry out activities will improve ?Outcome: Progressing ?  ?

## 2021-06-19 NOTE — ED Provider Notes (Signed)
[facility] EMERGENCY DEPARTMENT Provider Note   CSN: [id number] Arrival date & time: 06/19/21  4098     History  Chief Complaint  Patient presents with   Shortness of Breath    Kevin Cardenas is a 46 y.o. male.  The history is provided by the patient and medical records. No language interpreter was used.  Shortness of Breath Severity:  Severe Onset quality:  Gradual Duration:  1 week Timing:  Constant Progression:  Worsening Chronicity:  Recurrent Context: not URI   Relieved by:  Nothing Worsened by:  Deep breathing and exertion Ineffective treatments:  None tried Associated symptoms: chest pain (tightness) and cough   Associated symptoms: no abdominal pain, no diaphoresis, no fever, no headaches, no rash, no vomiting and no wheezing   Risk factors: no hx of PE/DVT       Home Medications Prior to Admission medications   Medication Sig Start Date End Date Taking? Authorizing Provider  ascorbic acid (VITAMIN C) 500 MG tablet Take 500-1,000 mg by mouth daily.    [provider]  aspirin 81 MG chewable tablet Chew 1 tablet (81 mg total) by mouth daily. 04/11/12   Zadie Rhine, MD  carvedilol (COREG) 25 MG tablet Take 1 tablet (25 mg total) by mouth 2 (two) times daily with a meal. 09/14/20   Laurey Morale, MD  dapagliflozin propanediol (FARXIGA) 10 MG TABS tablet TAKE 1 TABLET (10 MG TOTAL) BY MOUTH DAILY BEFORE BREAKFAST. 03/01/21   Laurey Morale, MD  digoxin (LANOXIN) 0.125 MG tablet Take 1 tablet (125 mcg total) by mouth daily. 09/14/20   Laurey Morale, MD  fluticasone furoate-vilanterol (BREO ELLIPTA) 200-25 MCG/INH AEPB Inhale 1 puff into the lungs daily. 08/05/17   Bensimhon, Bevelyn Buckles, MD  isosorbide-hydrALAZINE (BIDIL) 20-37.5 MG tablet Take 1 tablet by mouth 3 (three) times daily. 12/18/20   Laurey Morale, MD  potassium chloride SA (KLOR-CON) 20 MEQ tablet Take 1 tablet (20 mEq total) by mouth daily. 09/14/20   Laurey Morale, MD   sacubitril-valsartan (ENTRESTO) 97-103 MG Take 1 tablet by mouth 2 (two) times daily. 03/01/21   Laurey Morale, MD  simvastatin (ZOCOR) 20 MG tablet Take 1 tablet (20 mg total) by mouth at bedtime. 03/01/21   Laurey Morale, MD  spironolactone (ALDACTONE) 25 MG tablet Take 1 tablet (25 mg total) by mouth daily. 09/14/20   Laurey Morale, MD  torsemide (DEMADEX) 20 MG tablet Take 1 tablet (20 mg total) by mouth 2 (two) times daily. 03/01/21   Laurey Morale, MD      Allergies    Metolazone    Review of Systems   Review of Systems  Constitutional:  Positive for fatigue. Negative for chills, diaphoresis and fever.  HENT:  Negative for congestion.   Respiratory:  Positive for cough, chest tightness and shortness of breath. Negative for wheezing and stridor.   Cardiovascular:  Positive for chest pain (tightness) and leg swelling. Negative for palpitations.  Gastrointestinal:  Negative for abdominal pain, constipation, diarrhea, nausea and vomiting.  Genitourinary:  Negative for flank pain.  Musculoskeletal:  Negative for back pain.  Skin:  Negative for rash and wound.  Neurological:  Negative for dizziness, weakness, light-headedness and headaches.  Psychiatric/Behavioral:  Negative for agitation and confusion.   All other systems reviewed and are negative.  Physical Exam Updated Vital Signs BP 104/62   Pulse 64   Temp 98.7 F (37.1 C) (Oral)   Resp 13  Ht 5\' 6"  (1.676 m)   Wt 108.9 kg   SpO2 96%   BMI 38.74 kg/m  Physical Exam Vitals and nursing note reviewed.  Constitutional:      General: He is not in acute distress.    Appearance: He is well-developed. He is ill-appearing. He is not toxic-appearing or diaphoretic.  HENT:     Head: Normocephalic and atraumatic.     Mouth/Throat:     Mouth: Mucous membranes are moist.  Eyes:     Conjunctiva/sclera: Conjunctivae normal.     Pupils: Pupils are equal, round, and reactive to light.  Cardiovascular:     Rate and  Rhythm: Regular rhythm. Tachycardia present.     Heart sounds: Murmur heard.  Pulmonary:     Effort: Pulmonary effort is normal. Tachypnea present. No respiratory distress.     Breath sounds: Rales present. No rhonchi.  Chest:     Chest wall: No tenderness.  Abdominal:     Palpations: Abdomen is soft.     Tenderness: There is no abdominal tenderness.  Musculoskeletal:        General: No swelling.     Cervical back: Neck supple.     Right lower leg: No tenderness. Edema present.     Left lower leg: No tenderness. Edema present.  Skin:    General: Skin is warm and dry.     Capillary Refill: Capillary refill takes less than 2 seconds.     Findings: No erythema.  Neurological:     General: No focal deficit present.     Mental Status: He is alert.  Psychiatric:        Mood and Affect: Mood normal. Mood is not anxious.    ED Results / Procedures / Treatments   Labs (all labs ordered are listed, but only abnormal results are displayed) Labs Reviewed  CBC WITH DIFFERENTIAL/PLATELET - Abnormal; Notable for the following components:      Result Value   MCV 103.9 (*)    Platelets 124 (*)    All other components within normal limits  COMPREHENSIVE METABOLIC PANEL - Abnormal; Notable for the following components:   Potassium 5.4 (*)    Chloride 97 (*)    CO2 34 (*)    Glucose, Bld 159 (*)    Creatinine, Ser 1.48 (*)    Total Protein 6.4 (*)    Total Bilirubin 2.6 (*)    GFR, Estimated 59 (*)    All other components within normal limits  BRAIN NATRIURETIC PEPTIDE - Abnormal; Notable for the following components:   B Natriuretic Peptide 235.5 (*)    All other components within normal limits  MAGNESIUM - Abnormal; Notable for the following components:   Magnesium 2.6 (*)    All other components within normal limits  DIGOXIN LEVEL - Abnormal; Notable for the following components:   Digoxin Level 0.2 (*)    All other components within normal limits  RESP PANEL BY RT-PCR (FLU A&B,  COVID) ARPGX2  LIPASE, BLOOD  LACTIC ACID, PLASMA  TSH  D-DIMER, QUANTITATIVE (NOT AT [facility])  AMMONIA  ETHANOL  RAPID URINE DRUG SCREEN, HOSP PERFORMED  BLOOD GAS, ARTERIAL  BASIC METABOLIC PANEL  TROPONIN I (HIGH SENSITIVITY)  TROPONIN I (HIGH SENSITIVITY)    EKG EKG Interpretation  Date/Time:  Wednesday June 19 2021 09:37:03 EST Ventricular Rate:  98 PR Interval:  169 QRS Duration: 125 QT Interval:  388 QTC Calculation: 496 R Axis:   -76 Text Interpretation: Sinus rhythm Nonspecific IVCD  with LAD when compared to prior, similar appearance. with more t wave inversion in lead V2. No sTEMI Confirmed by Theda Belfast ([id number]) on 06/19/2021 9:40:44 AM  Radiology DG Chest Portable 1 View  Result Date: 06/19/2021 CLINICAL DATA:  Chest pain, shortness of breath EXAM: PORTABLE CHEST 1 VIEW COMPARISON:  11/04/2019 FINDINGS: Single lead left-sided implanted cardiac device. Stable cardiomegaly. Pulmonary vascular congestion with diffuse bilateral interstitial opacities. No large pleural fluid collection. No pneumothorax. IMPRESSION: Cardiomegaly with pulmonary vascular congestion and diffuse bilateral interstitial opacities, suggestive of CHF with pulmonary edema. Electronically Signed   By: Duanne Guess D.O.   On: 06/19/2021 10:20    Procedures Procedures    Medications Ordered in ED Medications  furosemide (LASIX) injection 60 mg (has no administration in time range)    ED Course/ Medical Decision Making/ A&P                           Medical Decision Making Amount and/or Complexity of Data Reviewed Labs: ordered. Radiology: ordered.  Risk Decision regarding hospitalization.    JYDEN BABULA is a 46 y.o. male with a past medical history significant for hypertension, CHF, diabetes, and sleep apnea who presents with worsening shortness of breath and chest tightness.  According to patient, for the last week he has had worsened edema in his legs and worsened shortness of  breath.  He cannot lay flat.  He reports his symptoms are very exertional.  He reports some chest tightness but does not have chest pain currently.  He denies any nausea, vomiting, diaphoresis, constipation, diarrhea, or urinary changes.  He does report a cough with some clear phlegm.  He denies significant wheezing.  On arrival, oxygen saturations were found to be in the 30s and patient was reportedly cyanotic.  He was placed on a breather and had some improvement.  His oxygen saturations do dip into the 70s and 80s at times but now on oxygen he is in the 90s.  On my exam, lungs are filled with rales but no wheezing on my exam.  Chest and abdomen were nontender.  He does have a slight systolic murmur.  Legs are edematous bilaterally.  Normal bowel sounds.  Good pulses in extremities.  Clinically I suspect CHF exacerbation versus PE versus a cardiac cause.  We will get labs including troponins, D-dimer, and BNP.  We will get chest x-ray and labs.  Anticipate admission for new oxygen requirement.  12:25 PM D-dimer negative, doubt PE.  Troponin normal.  BNP is more elevated than prior, given the fluid overload on exam, work-up, and story, I do suspect this is the cause.  We will call cardiology as he is a patient of Dr. Shirlee Latch and I anticipate he will need admission.  We will discuss if they want him on their service or medicine.  We will discuss what diuretic choice would be best for patient as he is on torsemide and spironolactone.  We will also send off a digoxin level.  12:39 PM Just spoke to cardiology who will come see the patient and help give guidance on diuretic plan and where patient would best be served with admission.  Cardiology will admit for further management of new hypoxia and fluid overload.         Final Clinical Impression(s) / ED Diagnoses Final diagnoses:  Hypoxia  SOB (shortness of breath)  Hypervolemia, unspecified hypervolemia type     Clinical Impression: 1.  Hypoxia  2. SOB (shortness of breath)   3. Hypervolemia, unspecified hypervolemia type     Disposition: Admit  This note was prepared with assistance of Dragon voice recognition software. Occasional wrong-word or sound-a-like substitutions may have occurred due to the inherent limitations of voice recognition software.     Sanika Brosious, Canary Brim, MD 06/19/21 1524

## 2021-06-19 NOTE — Progress Notes (Signed)
FASTR TRIAL  ? ?Patient Contacted about potential participation in [place] was discussed with the patient and the opportunity to ask questions was given.  Patient was given copy of consent.  Will follow-up for questions in about 45 minutes.   ? ?This patient is eligible for the FASTR Trial.  This trial consists of using a Decongestion Management System which is a fluid management console designed to provide personalized and automated infusion of the IV diuretic furosemide and Physiological saline in response to the patient's real-time urine output to safely and rapidly decongest patients suffering from acute decompensated Heart failure.  It incorporates a urine collection bag suspended from a weighing scale to accurately measure real time patient urine excretion.  This measured urine output rate is used to control a syringe pumop to infuse the IV diuretic furosemide and a peristaltic pump to infuse normal saline.  The system is controlled by a touch screen computer that acts as the system user interface and displays all relevant data to the health care professional such as urine output over time, diuretic infusion rate and saline infusion volume.  ? ?Key Inclusion Criteria:  ? - ? 10 lbs overloaded ? - prior use of loop diuretics ?- hospitalized with dx of ADHF - defined by the presence of at least 1 symptom (dyspnea, orthopenia, or edema/swelling) and 1 sign (peripherial edema, ascites, JVD) ? ? ?Jasmine Pang, RN BSN ?[place] [facility] ?Direct Line: [phone]  ?

## 2021-06-19 NOTE — Progress Notes (Signed)
RT placed pt on Bipap, pt tolerating well at this time. RN aware, RT will continue to monitor .  ?

## 2021-06-19 NOTE — Progress Notes (Addendum)
RT attempted to place pt on BiPAP, however, pt just finished drinking water. BiPAP is contraindicated at this time. RT will attempt to place pt on BiPAP in 30 mins. Pt sitting comfortably on 6L HFNC spo2 100%. RN aware, MD notified, RT will continue to monitor.   ?

## 2021-06-19 NOTE — ED Triage Notes (Signed)
Arrived via EMS; from home; c/o shortness of breathe, and some chest pains. Reported started last Monday; c/o swelling on legs and abdominal area. Endorsed hx of CHF;  ?

## 2021-06-19 NOTE — Progress Notes (Signed)
ABG collected and MD notified of results. RT will continue to monitor.  ? ?

## 2021-06-19 NOTE — Progress Notes (Signed)
?  Transition of Care (TOC) Screening Note ? ? ?Patient Details  ?Name: Kevin Cardenas ?Date of Birth: 05-29-1975 ? ? ?Transition of Care (TOC) CM/SW Contact:    ?Charae Depaolis, LCSW ?Phone Number: ?06/19/2021, 5:12 PM ? ? ? ?Transition of Care Department [facility]) has reviewed patient and no TOC needs have been identified at this time. We will continue to monitor patient advancement through interdisciplinary progression rounds. If new patient transition needs arise, please place a TOC consult. ?  ?

## 2021-06-19 NOTE — Progress Notes (Signed)
? ?  SITE # [id number]    SUBJECT#  ____      SUBJECT INITIALS: _BW__ ? ?INTERVAL: _0  Screening / Baseline _1   24 Hrs - During Tx ?      _2  48 Hrs - During Tx  _3   72 Hrs - End of Tx ?  _4  Post Tx - Within 24 Hrs _5  Post Tx - 72 hours after Tx Initiation ? ?  _6  Post Tx - 7 Days after Tx  Initiation  ?  _7  Discharge ?  _8  7-Day Follow-Up ?  _9  30 -Day Follow-UP ? ?Exam Performed:  _10  Yes  _11  No ?Exam Date; ____03/08/2023________  (DD/MMM/YYYY) ?                    ___17_:__30____ (HH;MM)    _12   Time Unknown  ? ?Temperature: _______ F ? ?Height: (Baseline only)   ____ _13  Inches  _14   cm _15  Not Done   _16   NA ? ?Estimated pounds of fluid overload:  ___20________ lbs ? ?Is Subject experiencing Shortness of Breath?  _17  Yes   _18   No ? ?Dyspnea VAS: (10 being the worst)   ?_19  1 _20  2 _21  3 _22  4 _23  5 _24  6 _25  7 _26 8 _27  9 _28  10 ? ?Is Subject experiencing Orthopnea?  _29  Yes    _30  No ? ?Is there evidence of Rales?  _31  Yes  _32   No ?   If Yes, Characterize:   _33  Rhonchi ?    _34  Crackles ?    _35  Wheezing ?    _36  Stridor ?Ascites? _37   Yes  _38   No ? ?Leg Edema?  _39  Yes    _40  No ?        If yes, grade of edema:  _41  1+  _42  2+  _43  3+  _44  4+ ? ?Sacral Edema?  _45  Yes   _46  No ?       If yes, grade of edema:    _47  1+  _48  2+  _49  3+  _50  4+ ? ?Jugular Vein Distention (JVD)?:  _51  Yes  _52   No ?       If yes, provide measurement:   __15__ cm ? ?NYHA Classification:  _53  I   _54  II  _55  III  _56   IV  _57   N/A ? ?Has subject expressed presence of thirst?  _58  Yes  _59   No ?      If Yes, rate of thirst level (10 being the worst): ?_60  1 _61  2 _62  3 _63  4 _64  5 _65  6 _66  7 _67 8 _68  9 _69  10 ? ? ?++++  PROVIDER NOTES OR COMMENTS:  ? ? ? ?Glori Bickers, MD  ?9:33 PM ? ? ? ? ? ? ? ? ?    ? ?      ? ? ? ?

## 2021-06-20 ENCOUNTER — Inpatient Hospital Stay (HOSPITAL_COMMUNITY): Payer: 59

## 2021-06-20 DIAGNOSIS — I5021 Acute systolic (congestive) heart failure: Secondary | ICD-10-CM

## 2021-06-20 DIAGNOSIS — I5043 Acute on chronic combined systolic (congestive) and diastolic (congestive) heart failure: Secondary | ICD-10-CM | POA: Diagnosis not present

## 2021-06-20 LAB — CBC
HCT: 43 % (ref 39.0–52.0)
HCT: 43.4 % (ref 39.0–52.0)
Hemoglobin: 13.4 g/dL (ref 13.0–17.0)
Hemoglobin: 13.7 g/dL (ref 13.0–17.0)
MCH: 32.8 pg (ref 26.0–34.0)
MCH: 32.5 pg (ref 26.0–34.0)
MCHC: 31.2 g/dL (ref 30.0–36.0)
MCHC: 31.6 g/dL (ref 30.0–36.0)
MCV: 105.1 fL — ABNORMAL HIGH (ref 80.0–100.0)
MCV: 103.1 fL — ABNORMAL HIGH (ref 80.0–100.0)
Platelets: 121 10*3/uL — ABNORMAL LOW (ref 150–400)
Platelets: 122 10*3/uL — ABNORMAL LOW (ref 150–400)
RBC: 4.09 MIL/uL — ABNORMAL LOW (ref 4.22–5.81)
RBC: 4.21 MIL/uL — ABNORMAL LOW (ref 4.22–5.81)
RDW: 13.2 % (ref 11.5–15.5)
RDW: 13.2 % (ref 11.5–15.5)
WBC: 6.2 10*3/uL (ref 4.0–10.5)
WBC: 5.5 10*3/uL (ref 4.0–10.5)
nRBC: 0 % (ref 0.0–0.2)
nRBC: 0 % (ref 0.0–0.2)

## 2021-06-20 LAB — COMPREHENSIVE METABOLIC PANEL
ALT: 19 U/L (ref 0–44)
ALT: 17 U/L (ref 0–44)
AST: 15 U/L (ref 15–41)
AST: 14 U/L — ABNORMAL LOW (ref 15–41)
Albumin: 3.5 g/dL (ref 3.5–5.0)
Albumin: 3.4 g/dL — ABNORMAL LOW (ref 3.5–5.0)
Alkaline Phosphatase: 47 U/L (ref 38–126)
Alkaline Phosphatase: 48 U/L (ref 38–126)
Anion gap: 6 (ref 5–15)
Anion gap: 10 (ref 5–15)
BUN: 14 mg/dL (ref 6–20)
BUN: 12 mg/dL (ref 6–20)
CO2: 36 mmol/L — ABNORMAL HIGH (ref 22–32)
CO2: 41 mmol/L — ABNORMAL HIGH (ref 22–32)
Calcium: 8.4 mg/dL — ABNORMAL LOW (ref 8.9–10.3)
Calcium: 7.8 mg/dL — ABNORMAL LOW (ref 8.9–10.3)
Chloride: 97 mmol/L — ABNORMAL LOW (ref 98–111)
Chloride: 89 mmol/L — ABNORMAL LOW (ref 98–111)
Creatinine, Ser: 1 mg/dL (ref 0.61–1.24)
Creatinine, Ser: 1.08 mg/dL (ref 0.61–1.24)
GFR, Estimated: >60 mL/min (ref >60)
GFR, Estimated: >60 mL/min (ref >60)
Glucose, Bld: 154 mg/dL — ABNORMAL HIGH (ref 70–99)
Glucose, Bld: 112 mg/dL — ABNORMAL HIGH (ref 70–99)
Potassium: 3.9 mmol/L (ref 3.5–5.1)
Potassium: 3.5 mmol/L (ref 3.5–5.1)
Sodium: 139 mmol/L (ref 135–145)
Sodium: 140 mmol/L (ref 135–145)
Total Bilirubin: 0.8 mg/dL (ref 0.3–1.2)
Total Bilirubin: 0.6 mg/dL (ref 0.3–1.2)
Total Protein: 6.6 g/dL (ref 6.5–8.1)
Total Protein: 6.9 g/dL (ref 6.5–8.1)

## 2021-06-20 LAB — COOXEMETRY PANEL
Carboxyhemoglobin: 2.1 % — ABNORMAL HIGH (ref 0.5–1.5)
Methemoglobin: 1.6 % — ABNORMAL HIGH (ref 0.0–1.5)
O2 Saturation: 80.6 %
Total hemoglobin: 13.8 g/dL (ref 12.0–16.0)

## 2021-06-20 LAB — GLUCOSE, CAPILLARY
Glucose-Capillary: 111 mg/dL — ABNORMAL HIGH (ref 70–99)
Glucose-Capillary: 104 mg/dL — ABNORMAL HIGH (ref 70–99)
Glucose-Capillary: 96 mg/dL (ref 70–99)
Glucose-Capillary: 104 mg/dL — ABNORMAL HIGH (ref 70–99)

## 2021-06-20 LAB — BASIC METABOLIC PANEL
Anion gap: 9 (ref 5–15)
BUN: 15 mg/dL (ref 6–20)
CO2: 33 mmol/L — ABNORMAL HIGH (ref 22–32)
Calcium: 8.3 mg/dL — ABNORMAL LOW (ref 8.9–10.3)
Chloride: 97 mmol/L — ABNORMAL LOW (ref 98–111)
Creatinine, Ser: 1.06 mg/dL (ref 0.61–1.24)
GFR, Estimated: >60 mL/min (ref >60)
Glucose, Bld: 111 mg/dL — ABNORMAL HIGH (ref 70–99)
Potassium: 4 mmol/L (ref 3.5–5.1)
Sodium: 139 mmol/L (ref 135–145)

## 2021-06-20 LAB — HEMOGLOBIN A1C
Hgb A1c MFr Bld: 6.1 % — ABNORMAL HIGH (ref 4.8–5.6)
Mean Plasma Glucose: 128.37 mg/dL

## 2021-06-20 LAB — ECHOCARDIOGRAM COMPLETE
AR max vel: 2.18 cm2
AV Area VTI: 2.39 cm2
AV Area mean vel: 2.06 cm2
AV Mean grad: 2 mmHg
AV Peak grad: 3.4 mmHg
Ao pk vel: 0.92 m/s
Area-P 1/2: 3.08 cm2
Calc EF: 35.3 %
Height: 66 in
S' Lateral: 3.7 cm
Single Plane A2C EF: 29.4 %
Single Plane A4C EF: 35 %
Weight: 3945.6 oz

## 2021-06-20 LAB — MAGNESIUM
Magnesium: 2.3 mg/dL (ref 1.7–2.4)
Magnesium: 2.3 mg/dL (ref 1.7–2.4)
Magnesium: 1.8 mg/dL (ref 1.7–2.4)

## 2021-06-20 LAB — PROCALCITONIN: Procalcitonin: <0.1 ng/mL

## 2021-06-20 MED ORDER — SPIRONOLACTONE 12.5 MG HALF TABLET
12.5000 mg | ORAL_TABLET | Freq: Every day | ORAL | Status: DC
  Administered 2021-06-20: 11:00:00 12.5 mg via ORAL
  Filled 2021-06-20: qty 1

## 2021-06-20 MED ORDER — STUDY - FASTR - FUROSEMIDE (LASIX) 10 MG/ML IV BOLUS VIA INFUSION (PI-BENISMHON)
0.0000 mg | Freq: Once | INTRAVENOUS | Status: AC
  Administered 2021-06-20: 09:00:00 39 mg via INTRAVENOUS
  Filled 2021-06-20: qty 20

## 2021-06-20 MED ORDER — CHLORHEXIDINE GLUCONATE CLOTH 2 % EX PADS
6.0000 | MEDICATED_PAD | Freq: Every day | CUTANEOUS | Status: DC
  Administered 2021-06-20 – 2021-06-23 (×4): 6 via TOPICAL

## 2021-06-20 MED ORDER — STUDY - FASTR - FUROSEMIDE (LASIX) 10 MG/ML IV INFUSION (PI-BENISMHON)
4.0000 mg/h | INTRAVENOUS | Status: DC
  Administered 2021-06-20: 10:00:00 8 mg/h via INTRAVENOUS
  Administered 2021-06-21 (×2): 27 mg/h via INTRAVENOUS
  Filled 2021-06-20 (×4): qty 50

## 2021-06-20 NOTE — Progress Notes (Signed)
Labs collected within 60 minutes of treatment initiation of FASTR Trial

## 2021-06-20 NOTE — Evaluation (Signed)
Occupational Therapy Evaluation ?Patient Details ?Name: Kevin Cardenas ?MRN: [id number] ?DOB: [birth year] ?Today's Date: 06/20/2021 ? ? ?History of Present Illness Pt is 46 yo male who was admitted for acute hypoxic respiratory failure in setting of acute on chronic systolic heart failure and AKI. PMH includes VSD repair and removal of a pulmonary artery band at age 53, DM, HTN, systolic heart failure, NICM and OSA w/ poor compliance w/ CPAP.  ? ?Clinical Impression ?  ?Pt admitted with the above diagnoses and presents with below problem list. Pt will benefit from continued acute OT to address the below listed deficits and maximize independence with basic ADLs prior to d/c home. At baseline, pt is independent to mod I with basic ADLs, endorses seeking intermittent external support while walking. Pt currently needs up to min guard assist with functional transfers and mobility, and LB ADLs.  ?  ?   ? ?Recommendations for follow up therapy are one component of a multi-disciplinary discharge planning process, led by the attending physician.  Recommendations may be updated based on patient status, additional functional criteria and insurance authorization.  ? ?Follow Up Recommendations ? Home health OT  ?  ?Assistance Recommended at Discharge Set up Supervision/Assistance  ?Patient can return home with the following Assistance with cooking/housework ? ?  ?Functional Status Assessment ? Patient has had a recent decline in their functional status and demonstrates the ability to make significant improvements in function in a reasonable and predictable amount of time.  ?Equipment Recommendations ? BSC/3in1  ?  ?Recommendations for Other Services PT consult ? ? ?  ?Precautions / Restrictions Precautions ?Precautions: Fall ?Restrictions ?Weight Bearing Restrictions: No  ? ?  ? ?Mobility Bed Mobility ?Overal bed mobility: Needs Assistance ?Bed Mobility: Supine to Sit ?  ?  ?Supine to sit: Min guard, HOB elevated ?  ?  ?General bed  mobility comments: utilized momentum and rail, extra time and effort ?  ? ?Transfers ?Overall transfer level: Needs assistance ?  ?Transfers: Sit to/from Stand ?Sit to Stand: Min guard ?  ?  ?  ?  ?  ?General transfer comment: to/from regular bed height. ?  ? ?  ?Balance Overall balance assessment: Mild deficits observed, not formally tested ?  ?  ?  ?  ?  ?  ?  ?  ?  ?  ?  ?  ?  ?  ?  ?  ?  ?  ?   ? ?ADL either performed or assessed with clinical judgement  ? ?ADL Overall ADL's : Needs assistance/impaired ?Eating/Feeding: Set up;Sitting ?  ?Grooming: Set up;Sitting ?  ?Upper Body Bathing: Set up;Sitting ?  ?Lower Body Bathing: Min guard;Sit to/from stand ?  ?Upper Body Dressing : Set up;Sitting ?  ?Lower Body Dressing: Min guard;Sit to/from stand ?  ?Toilet Transfer: Min guard;Ambulation ?  ?Toileting- Water quality scientist and Hygiene: Min guard;Sit to/from stand ?  ?  ?  ?Functional mobility during ADLs: Min guard ?General ADL Comments: Session limited by FASTR trial lines. Pt stood at side of bed and walked in place a bit. Increased R hip pain while walking.  ? ? ? ?Vision   ?   ?   ?Perception   ?  ?Praxis   ?  ? ?Pertinent Vitals/Pain Pain Assessment ?Pain Assessment: Faces ?Faces Pain Scale: Hurts little more ?Pain Location: R hip while walking in place ?Pain Descriptors / Indicators: Grimacing, Guarding ?Pain Intervention(s): Monitored during session, Limited activity within patient's tolerance  ? ? ? ?Hand  Dominance   ?  ?Extremity/Trunk Assessment Upper Extremity Assessment ?Upper Extremity Assessment: Overall WFL for tasks assessed ?  ?Lower Extremity Assessment ?Lower Extremity Assessment: Defer to PT evaluation ?  ?  ?  ?Communication Communication ?Communication: No difficulties ?  ?Cognition Arousal/Alertness: Awake/alert ?Behavior During Therapy: [facility] for tasks assessed/performed ?Overall Cognitive Status: Within Functional Limits for tasks assessed ?  ?  ?  ?  ?  ?  ?  ?  ?  ?  ?  ?  ?  ?  ?  ?  ?  ?   ?  ?General Comments    ? ?  ?Exercises   ?  ?Shoulder Instructions    ? ? ?Home Living Family/patient expects to be discharged to:: Private residence ?Living Arrangements: Spouse/significant other ?Available Help at Discharge: Family ?Type of Home: House ?Home Access: Stairs to enter ?Entrance Stairs-Number of Steps: 1 ?  ?Home Layout: One level ?  ?  ?Bathroom Shower/Tub: Tub/shower unit ?  ?  ?  ?  ?Home Equipment: None ?  ?  ?  ? ?  ?Prior Functioning/Environment Prior Level of Function : Independent/Modified Independent ?  ?  ?  ?  ?  ?  ?Mobility Comments: endorses intermittent external support while walking, h/o R hip pain. Does not use/own AD at baseline. ?  ?  ? ?  ?  ?OT Problem List: Decreased activity tolerance;Impaired balance (sitting and/or standing);Decreased knowledge of use of DME or AE;Decreased knowledge of precautions;Cardiopulmonary status limiting activity;Obesity ?  ?   ?OT Treatment/Interventions: Self-care/ADL training;Therapeutic exercise;Energy conservation;DME and/or AE instruction;Therapeutic activities;Balance training;Patient/family education  ?  ?OT Goals(Current goals can be found in the care plan section) Acute Rehab OT Goals ?Patient Stated Goal: not stated ?OT Goal Formulation: With patient ?Time For Goal Achievement: 07/04/21 ?Potential to Achieve Goals: Good ?ADL Goals ?Pt Will Perform Grooming: with modified independence;standing ?Pt Will Perform Lower Body Bathing: with modified independence;sit to/from stand ?Pt Will Perform Lower Body Dressing: with modified independence;sit to/from stand ?Pt Will Transfer to Toilet: with modified independence;ambulating ?Pt Will Perform Tub/Shower Transfer: with modified independence;ambulating  ?OT Frequency: Min 2X/week ?  ? ?Co-evaluation   ?  ?  ?  ?  ? ?  ?AM-PAC OT "6 Clicks" Daily Activity     ?Outcome Measure Help from another person eating meals?: None ?Help from another person taking care of personal grooming?: None ?Help from  another person toileting, which includes using toliet, bedpan, or urinal?: A Little ?Help from another person bathing (including washing, rinsing, drying)?: A Little ?Help from another person to put on and taking off regular upper body clothing?: None ?Help from another person to put on and taking off regular lower body clothing?: A Little ?6 Click Score: 21 ?  ?End of Session Equipment Utilized During Treatment: Oxygen ?Nurse Communication: Other (comment) (FASTR nurses present throughout session) ? ?Activity Tolerance: Patient tolerated treatment well ?Patient left: in bed;with call bell/phone within reach;with nursing/sitter in room ? ?OT Visit Diagnosis: Unsteadiness on feet (R26.81);Muscle weakness (generalized) (M62.81);Other (comment) (decreased activity tolerance)  ?              ?Time: 2025-4270 ?OT Time Calculation (min): 12 min ?Charges:  OT General Charges ?$OT Visit: 1 Visit ?OT Evaluation ?$OT Eval Low Complexity: 1 Low ? ?Tyrone Schimke, OT ?Acute Rehabilitation Services ?Office: [phone] ? ? ?Tyrone Schimke H ?06/20/2021, 11:05 AM ?

## 2021-06-20 NOTE — TOC Initial Note (Addendum)
Transition of Care (TOC) - Initial/Assessment Note  ? ? ?Patient Details  ?Name: Kevin Cardenas ?MRN: [id number] ?Date of Birth: Feb 14, 1976 ? ?Transition of Care [facility]) CM/SW Contact:    ?Marcheta Grammes Rexene Alberts, RN ?Phone Number: [phone] ?06/20/2021, 5:46 PM ? ?Clinical Narrative:                 ?HF TOC CM spoke to pt and states he is independent and drives to appts. Has CPAP at home. Pt declined Home Health. Will continue to follow up for dc needs.  ? ?Expected Discharge Plan: Home/Self Care ?Barriers to Discharge: Continued Medical Work up ? ? ?Patient Goals and CMS Choice ? ?Expected Discharge Plan and Services ?Expected Discharge Plan: Home/Self Care ?  ?Discharge Planning Services: CM Consult ?  ?Living arrangements for the past 2 months: [place] ? ? ?Prior Living Arrangements/Services ?Living arrangements for the past 2 months: [place] ?Lives with:: Spouse ?Patient language and need for interpreter reviewed:: Yes ?Do you feel safe going back to the place where you live?: Yes      ?Need for Family Participation in Patient Care: No (Comment) ?Care giver support system in place?: No (comment) ?  ?Criminal Activity/Legal Involvement Pertinent to Current Situation/Hospitalization: No - Comment as needed ? ?Activities of Daily Living ?Home Assistive Devices/Equipment: Eyeglasses, Blood pressure cuff, CPAP ?ADL Screening (condition at time of admission) ?Patient's cognitive ability adequate to safely complete daily activities?: Yes ?Is the patient deaf or have difficulty hearing?: No ?Does the patient have difficulty seeing, even when wearing glasses/contacts?: No ?Does the patient have difficulty concentrating, remembering, or making decisions?: No ?Patient able to express need for assistance with ADLs?: Yes ?Does the patient have difficulty dressing or bathing?: No ?Independently performs ADLs?: Yes (appropriate for developmental age) ?Does the patient have difficulty walking or climbing  stairs?: Yes ?Weakness of Legs: None ?Weakness of Arms/Hands: None ? ?Permission Sought/Granted ?Permission sought to share information with : Case Manager, PCP, Family Supports ?Permission granted to share information with : Yes, Verbal Permission Granted ? Share Information with NAME: Jerard Bays ?   ? Permission granted to share info w Relationship: wife ? Permission granted to share info w Contact Information: [phone] ? ?Emotional Assessment ?  ?Attitude/Demeanor/Rapport: Gracious ?Affect (typically observed): Accepting ?Orientation: : Oriented to Self, Oriented to Place, Oriented to  Time, Oriented to Situation ?  ?Psych Involvement: No (comment) ? ?Admission diagnosis:  SOB (shortness of breath) [R06.02] ?Hypoxia [R09.02] ?Acute on chronic systolic and diastolic heart failure, NYHA class 3 ([place]) [I50.43] ?Hypervolemia, unspecified hypervolemia type [E87.70] ?Patient Active Problem List  ? Diagnosis Date Noted  ? Acute on chronic systolic and diastolic heart failure, NYHA class 3 ([place]) 06/19/2021  ? HFrEF (heart failure with reduced ejection fraction) ([place]) 11/06/2019  ? AKI (acute kidney injury) ([place]) 11/05/2019  ? Orthostatic hypotension 11/04/2019  ? Class 2 obesity due to excess calories with serious comorbidity in adult 01/16/2016  ? Asthma, stable, mild intermittent 01/15/2016  ? OSA on CPAP 06/21/2014  ? Routine general medical examination at a health care facility 04/18/2014  ? Hyperlipidemia with target LDL less than 100 04/17/2014  ? Non-ischemic cardiomyopathy ([place]) 02/06/2011  ? Systolic CHF, chronic ([place]) 02/06/2011  ? VSD (ventricular septal defect) 02/06/2011  ? HTN (hypertension) 02/06/2011  ? Prediabetes 01/30/2011  ? ?PCP:  Dorna Mai, MD ?Pharmacy:   ?[facility] ?[address] ?[place] [address] ?Phone: [phone] Fax: [phone] ? ? ? ? ?  Social Determinants of Health (SDOH) Interventions ?  ? ?Readmission Risk Interventions ?No flowsheet data  found. ? ? ?

## 2021-06-20 NOTE — Research (Signed)
FASTR Informed Consent  ? ?Subject Name: Kevin Cardenas ? ?Subject met inclusion and exclusion criteria.  The informed consent form, study requirements and expectations were reviewed with the subject and questions and concerns were addressed prior to the signing of the consent form.  The subject verbalized understanding of the trial requirements.  The subject agreed to participate in the FASTR trial and signed the informed consent at 1518 on 19-Jun-2021.  The informed consent was obtained prior to performance of any protocol-specific procedures for the subject.  A copy of the signed informed consent was given to the subject and a copy was placed in the subject's medical record.  ? ?Jasmine Pang, RN BSN ?[place] ?[facility] ?Direct Line: [phone] ? ?  ?

## 2021-06-20 NOTE — Consult Note (Signed)
NAME:  Kevin Cardenas, MRN:  [id number], DOB:  [birth year], LOS: 1 ADMISSION DATE:  06/19/2021, CONSULTATION DATE: 06/20/2021 REFERRING MD: Cardiology, CHIEF COMPLAINT: Acute on chronic hypercarbic hypoxic respiratory failure  History of Present Illness:  [name] is a 46 year old male who was diagnosed with mild sleep apnea in 2016 by Dr. Halford Chessman and has not been back to Dr. Halford Chessman since 2016.  He reports he has CPAP machine that he uses every night despite reports from other specialist that he is noncompliant with CPAP.  He presented he says 2 days of shortness of breath but 2 months of increasing lower extremity edema and is vague about whether or not he is truly taking his medications.  Pulmonary critical care is asked to evaluate for sleep apnea is acute on chronic hypercarbic hypoxic respiratory failure.  Suspect once he is adequately diuresed his hypoxia should correct.  He should follow-up with pulmonary as an outpatient to further manage his sleep apnea.  He reports having quit smoking several years ago.  Denies drug abuse although he is positive for marijuana on toxicology screen.  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  Pertinent  Medical History   Past Medical History:  Diagnosis Date   Chronic systolic CHF (congestive heart failure) (HCC)    EF 25% by echo 06/2018   Diabetes mellitus    Hypertension    Non-ischemic cardiomyopathy ([place])    Obstructive sleep apnea    VSD (ventricular septal defect) 1978   repaired at age [place] Hospital Events: Including procedures, antibiotic start and stop dates in addition to other pertinent events   06/20/2021 aggressive diuresis  Interim History / Subjective:  Admitted with volume overload and is improving with diuresis  Objective   Blood pressure 99/71, pulse (!) 104, temperature (!) 100.7 F (38.2 C), temperature source Oral, resp. rate 20, height _0  (1.676 m), weight 111.9 kg, SpO2 94 %. CVP:  [15 mmHg-22  mmHg] 15 mmHg  FiO2 (%):  [30 %-40 %] 40 %   Intake/Output Summary (Last 24 hours) at 06/20/2021 0949 Last data filed at 06/20/2021 9629 Gross per 24 hour  Intake 240 ml  Output 2100 ml  Net -1860 ml   Filed Weights   06/19/21 2019 06/20/21 0343 06/20/21 0944  Weight: 112.6 kg 112.2 kg 111.9 kg    Examination: General: Obese male lying in bed no acute distress at rest HENT: No JVD or lymphadenopathy is appreciated Lungs: Diminished breath sounds throughout Cardiovascular: Heart sounds are regular Abdomen: Obese soft nontender Extremities: 2-3+ edema Neuro: Grossly intact without focal defect GU: Voids  Resolved Hospital Problem list     Assessment & Plan:  Acute on chronic hypercarbic hypoxic respiratory failure in the setting of mild obstructive sleep apnea as documented by sleep study 2016 by Dr. Halford Chessman of pulmonary also with volume overload in setting of cardiac dysfunction volume overload with a history of VSD repair and removal of the pulmonary artery band at age 51.  He is morbidly obese at 240 pounds.  Reported to be noncompliant with CPAP but he reports he uses it at least nightly. Nocturnal and as needed noninvasive mechanical ventilatory support.  Noted to have a pH of 7.26 PCO2 of 83 and a PO2 of 79 with a total CO2 of 40 O2 as needed to keep sats greater than 90% suspect once his volume overload is corrected come off oxygen He has not had a follow-up  evaluation by pulmonary since 2016 to have a recent sleep study and a suspected weight gain situation therefore he needs a follow-up appointment with pulmonary.  Volume overload in the setting of known nonischemic cardiomyopathy with a EF of 30 to 35% with new 2D echo pending and indications of volume overload 2+ pitting edema chest x-ray with findings consistent with edema. Per heart vascular team Agree with diuresis  Diabetes mellitus CBG (last 3)  Recent Labs    06/19/21 2046 06/20/21 0625  GLUCAP 109* 111*   Per  primary  Best Practice (right click and "Reselect all SmartList Selections" daily)   Diet/type: Regular consistency (see orders) DVT prophylaxis: prophylactic heparin  GI prophylaxis: PPI Lines: Central line Foley:  Yes, and it is still needed Code Status:  full code Last date of multidisciplinary goals of care discussion [tbd]  Labs   CBC: Recent Labs  Lab 06/19/21 0957 06/19/21 1620 06/19/21 2046 06/20/21 0355  WBC 7.9  --  6.5 6.2  NEUTROABS 6.4  --   --   --   HGB 14.2 14.6 14.0 13.4  HCT 45.7 43.0 44.3 43.0  MCV 103.9*  --  103.3* 105.1*  PLT 124*  --  117* 121*    Basic Metabolic Panel: Recent Labs  Lab 06/19/21 0957 06/19/21 1609 06/19/21 1620 06/19/21 2046 06/20/21 0355  NA 138 140 138  --  139  K 5.4* 4.1 4.1  --  4.0  CL 97* 96*  --   --  97*  CO2 34* 34*  --   --  33*  GLUCOSE 159* 111*  --   --  111*  BUN 19 17  --   --  15  CREATININE 1.48* 1.26*  --  1.10 1.06  CALCIUM 9.0 8.6*  --   --  8.3*  MG 2.6* 2.5*  --   --  2.3   GFR: Estimated Creatinine Clearance: 102.2 mL/min (by C-G formula based on SCr of 1.06 mg/dL). Recent Labs  Lab 06/19/21 0957 06/19/21 1100 06/19/21 2046 06/20/21 0355  PROCALCITON  --   --   --  <0.10  WBC 7.9  --  6.5 6.2  LATICACIDVEN  --  0.8  --   --     Liver Function Tests: Recent Labs  Lab 06/19/21 0957 06/19/21 1609  AST 35 18  ALT 10 20  ALKPHOS 45 50  BILITOT 2.6* 0.8  PROT 6.4* 6.7  ALBUMIN 3.5 3.5   Recent Labs  Lab 06/19/21 0957  LIPASE 32   Recent Labs  Lab 06/19/21 1641  AMMONIA 21    ABG    Component Value Date/Time   PHART 7.266 (L) 06/19/2021 1620   PCO2ART 83.1 (HH) 06/19/2021 1620   PO2ART 79 (L) 06/19/2021 1620   HCO3 37.8 (H) 06/19/2021 1620   TCO2 40 (H) 06/19/2021 1620   ACIDBASEDEF 2.0 08/03/2014 1417   O2SAT 80.6 06/20/2021 0730     Coagulation Profile: No results for input(s): INR, PROTIME in the last 168 hours.  Cardiac Enzymes: No results for input(s):  CKTOTAL, CKMB, CKMBINDEX, TROPONINI in the last 168 hours.  HbA1C: Hgb A1c MFr Bld  Date/Time Value Ref Range Status  06/20/2021 03:55 AM 6.1 (H) 4.8 - 5.6 % Final    Comment:    (NOTE) Pre diabetes:          5.7%-6.4%  Diabetes:              >6.4%  Glycemic control for   <  7.0% adults with diabetes   04/17/2021 10:47 AM 6.4 (H) 4.8 - 5.6 % Final    Comment:             Prediabetes: 5.7 - 6.4          Diabetes: >6.4          Glycemic control for adults with diabetes: <7.0     CBG: Recent Labs  Lab 06/19/21 2046 06/20/21 0625  GLUCAP 109* 111*    Review of Systems:   10 point review of system taken, please see HPI for positives and negatives.  Reports being compliant with noninvasive ventilatory support at night i.e. CPAP He was last seen by pulmonologist 2016 Dr. Halford Chessman which time he had a sleep study which revealed mild sleep apnea.  I would presume that he has gained weight since 2016. Reports being compliant with medications Noted to be positive for marijuana on toxicology  Past Medical History:  He,  has a past medical history of Chronic systolic CHF (congestive heart failure) ([place]), Diabetes mellitus, Hypertension, Non-ischemic cardiomyopathy ([place]), Obstructive sleep apnea, and VSD (ventricular septal defect) (1978).   Surgical History:   Past Surgical History:  Procedure Laterality Date   EP IMPLANTABLE DEVICE N/A 01/19/2015   Procedure: ICD Implant;  Surgeon: Deboraha Sprang, MD;  Location: [facility];  Service: Cardiovascular;  Laterality: N/A;   RIGHT HEART CATHETERIZATION N/A 08/03/2014   Procedure: RIGHT HEART CATH;  Surgeon: Larey Dresser, MD;  [facility]: [facility] C[facility];  Service: Cardiovascular;  Laterality: N/A;   VSD REPAIR       Social History:   reports that he quit smoking about 10 years ago. His smoking use included cigarettes. He has a 4.50 pack-year smoking history. He has never used smokeless tobacco. He reports that he does not drink  alcohol and does not use drugs.   Family History:  His family history includes Diabetes in his maternal grandfather and paternal grandmother; Lung cancer in his father; Stroke in his father and mother. There is no history of Cancer, Alcohol abuse, Drug abuse, Early death, Heart disease, Hyperlipidemia, Hypertension, or Kidney disease.   Allergies Allergies  Allergen Reactions   Metolazone Other (See Comments)    "Dried up my kidneys- took only one tablet"     Home Medications  Prior to Admission medications   Medication Sig Start Date End Date Taking? Authorizing Provider  ascorbic acid (VITAMIN C) 500 MG tablet Take 500-1,000 mg by mouth daily.   Yes [provider]  aspirin 81 MG chewable tablet Chew 1 tablet (81 mg total) by mouth daily. 04/11/12  Yes Ripley Fraise, MD  carvedilol (COREG) 25 MG tablet Take 1 tablet (25 mg total) by mouth 2 (two) times daily with a meal. 09/14/20  Yes Larey Dresser, MD  dapagliflozin propanediol (FARXIGA) 10 MG TABS tablet TAKE 1 TABLET (10 MG TOTAL) BY MOUTH DAILY BEFORE BREAKFAST. Patient taking differently: Take 10 mg by mouth daily. 03/01/21  Yes Larey Dresser, MD  digoxin (LANOXIN) 0.125 MG tablet Take 1 tablet (125 mcg total) by mouth daily. 09/14/20  Yes Larey Dresser, MD  fluticasone furoate-vilanterol (BREO ELLIPTA) 200-25 MCG/INH AEPB Inhale 1 puff into the lungs daily. 08/05/17  Yes Bensimhon, Shaune Pascal, MD  ibuprofen (ADVIL) 200 MG tablet Take 400 mg by mouth every 6 (six) hours as needed for mild pain.   Yes [provider]  isosorbide-hydrALAZINE (BIDIL) 20-37.5 MG tablet Take 1 tablet by mouth 3 (  three) times daily. 12/18/20  Yes Larey Dresser, MD  potassium chloride SA (KLOR-CON) 20 MEQ tablet Take 1 tablet (20 mEq total) by mouth daily. 09/14/20  Yes Larey Dresser, MD  sacubitril-valsartan (ENTRESTO) 97-103 MG Take 1 tablet by mouth 2 (two) times daily. 03/01/21  Yes Larey Dresser, MD  simvastatin (ZOCOR) 20  MG tablet Take 1 tablet (20 mg total) by mouth at bedtime. 03/01/21  Yes Larey Dresser, MD  torsemide (DEMADEX) 20 MG tablet Take 1 tablet (20 mg total) by mouth 2 (two) times daily. 03/01/21  Yes Larey Dresser, MD  spironolactone (ALDACTONE) 25 MG tablet Take 1 tablet (25 mg total) by mouth daily. Patient not taking: Reported on 06/19/2021 09/14/20   Larey Dresser, MD     Critical care time:    Richardson Landry Mitsuko Luera ACNP Acute Care Nurse Practitioner [place] Please consult Amion 06/20/2021, 9:49 AM

## 2021-06-20 NOTE — Progress Notes (Signed)
Orthopedic Tech Progress Note ?Patient Details:  ?Kevin Cardenas ?1975/06/28 ?[id number] ? ?Ortho Devices ?Type of Ortho Device: Unna boot ?Ortho Device/Splint Location: BLE ?Ortho Device/Splint Interventions: Ordered, Application ?  ?Post Interventions ?Patient Tolerated: Well ? ?Kevin Cardenas ?06/20/2021, 10:43 AM ? ?

## 2021-06-20 NOTE — Research (Signed)
? ?  SITE #   [id number]            SUBJECT #:   07         SUBJECT INITIALS: BNW ? ?RANDOMIZATION: ? ?Does the subject meet all the study criteria?   _0  YES  _1  NO ? ?Is the patient suitable to receive diuretic therapy per the high dose arm of the DOSE-AHF trial? (IV loop diuretic at 2.5 times the home dose of oral loop diuretic,  ?Given by continuous infusion or bolus, on a mg per mg basis)     _2  YES  _3   NO ? ?    If NO, Provide reason: Per Provider if randomized to ODT would perscribe 80 mg Lasix BID ? ?    If YES, Proposed Type and Dosage if Subject is Randomized to OMT: ?            _4   Furosemide  _____  mg ? _5   Bumetanide  _____  mg ? _6   Torsemide    _____  mg ? ?Date / Time of Randomization:   _09_/_MAR_/_2023_       _06__:_00__         _7   Time Unknown ?           DD MMM YYYY HH;MM ? ?RANDOMIZATION: (Generated by the system) ? ?  _8   Reprieve DMS ? _9   OMT   ? ? ? ?Form is based on Page 14 of FASTR Trial  eCRFs    Protocol [id number]-4742  ?                                                                   ?              ?

## 2021-06-20 NOTE — Research (Signed)
ENROLLMENT  Site # [id number] Subject # __07____          Visit Date:      DD/MMM/YYYY  Was Informed Consent obtained?    _0   Yes   _1   No        Date of informed consent: _08-MAR-2023__  DD/MMM/YYYY  Did the Subject meet all eligibility criteria?    _2   Yes  _3   No        If no, specify Criterion not met: __________________        If yes, will the subject be enrolled in the treatment or registry part of the study?                    _4   Treatment         _5   Registry  INCLUSION CRITERIA:     _6  INCL 1   Hospitalized with a diagnosis of heart failure as defined by the presence of at least 1 symptom (dyspnea, orthopnea, or edema) AND 1 sign (peripheral edema, ascites, jugular venous distension)      _7  INCL 2  >/= 10 lb (4.5 kg) above dry weight either by historical weights or as estimated by health care provider.      _8  INCL 3  Prior use of loop diuretics with a total daily dose of 80 mg to 400 mg furosemide equivalents (20 mg torsemide = 1 mg bumetanide = 68m oral furosemide) for a minimum of 30 days prior to admission.   Home dose of diuretic: ________________ (Type, dose)     _9  INCL 4   Patients >/= 47years of age, able to provide informed consent and comply with study procedures.     EXCLUSION CRITERIA:     _10  EXCL 1  Inability to place Foley catheter or IV catheter or other urologic issues that would predispose the patient to a high rate of urogenital trauma or infection with catheter placement.      _11  EXCL 2   Hemodynamic instability as defined by any of the following: systolic blood pressure <<41mmHg, use of IV inotropes or vasopressors, mechanical circulatory support, uncontrolled arrhythmias, active severe bleeding, or confirmed or suspected cardiogenic shock.      _12  EXCL 3  Dyspnea due primarily to non-cardiac causes (e.g., severe chronic obstructive pulmonary disease or pneumonia).      _13  EXCL 4  Acute infection with evidence of systemic involvement (e.g.,  clinically suspected infection with fever or elevated serum white blood count).     _14  EXCL 5   Estimated glomerular filtration rate (eGFR) < 20 ml/min/1.758m calculated using the MDRD equation or current use of renal replacement therapy,      _15  EXCL 6   Significant left ventricular outflow obstruction, uncorrected complex congenital heart disease, known sever stenotic valvular disease, infiltrative or constrictive cardiomyopathy, acute myocarditis, type 1 acute myocardial infarction requiring treatment (within previous week) or any other pathology that, in the opinion of the investigator, would make aggressive diuresis poorly tolerated.      _16  EXCL 7  Inability to follow instructions or comply with follow-up procedures.       _17  EXCL 8  Other concomitant disease or condition that investigator deems unsuitable for the study, including drug or alcohol abuse or psychiatric, behavioral, or cognitive disorders, sufficient to interfere with the patient's ability to understand and comply with the study instructions or follow-up procedures.     _18  EXCL 9  Severe electrolyte abnormalities (elgl, serum potassium < 3.0 mEq/L, Magnesium < 1.3 mEq/L or sodium < 125 mEq/l).     _0  EXCL 10  Presence of active COVID-19 infection     _1  EXCL 11  Enrollment in another interventional trial during the index hospitalization    _2  EXCL 12  Inability of the patient to stand and obtain daily standing weights.     _3  EXCL 13  Inability to return for follow-up study visits.     _4  EXCL 14  Life expectancy less that 3 months    _5  EXCL 64  Women who are pregnant or intend to become pregnant.     Form is based on Pages 1-3 of FASTR Trial  eCRFs    Protocol [id number]   DEMOGRAPHICS  Birth Date: _[birth year]___/_SEP___/_1977_____ (DD/MMM/YYYY)  Age, if DOB refused: __45______  Gender: ? _6 Male ?_7  Male  Ethnicity: (check only one) ?  _8  Hispanic or Latino  _9  Not Hispanic or Latino  _10  Not Reported  _11  Unknown    Race: (check all that apply) ?  _12  American [place] or [place] Native  _13  Asian  _14  Black or African American  _15  Native Hawaiian or Other Pacific Islander  _16  White  _17  Other, specify: _______________  Was pregnancy test completed? ? Yes ?X No  If Yes, Date, Type and Result: ____/____/_______ (DD/MMM/YYYY)  ? _18  Urine ? _19  Blood  ? _20  Negative ? _21  Positive  If No, provide reason not done: ?_22  Male subject  ? Male subject not of childbearing potential  ? Other, specify: _______________________   Recent Echo (within 6 months) Performed? ? _23  Yes ?_24  No ?_25  Not Available  If Yes, Date, ___/___/_____ DD/MMM/YYYY)  LVEF ________ %  LVEDD ________ cm  LVEDP: ________ mmHg   Recent Right Heart Cath ? _26 Yes ?_27  No  If Yes, Date, ___/___/_____ DD/MMM/YYYY) Pulmonary Artery Pressure (PAP) ______ / ______ mmHg ? _28  Unknown  Pulmonary Capillary Wedge Pressure (PCWP) __________ mmHg ? _29  Unknown   Smoking History:  Usage ? _30  Never ?_31  Former ?_32  Current  Frequency ?_33   Daily ?_34   Every week ?_35  Occasional  Start Date _UK___/_UNK___/_1997______ (DD/MMM/YYYY)  End Date _25___/_DEC___/__2012_____(DD/MMM/YYYY)  Alcohol Usage:  Usage  _36  Never _37  Former _38  Current  Frequency _39  Daily _40  Every week _41  Occasional  Start Date ____/____/_______ (DD/MMM/YYYY)  End Date ____/____/_______ (DD/MMM/YYYY)   Any Allergies? _42  Yes _43  No  If yes, specify allergies: _Food_____________________________________________________________  If yes, specify allergies: _Contact_____________________________________________________________  If yes, specify allergies: _Medications____Metolazone_______________________________  SITE #   001            SUBJECT #:    07        SUBJECT INITIALS: BNW  WEIGHT ASSESSMENT  INTERVAL: _44  Screening / Baseline _45   Treatment Initation                         _46   24 Hrs - During Tx           _47  48 Hrs - During Tx        _48   72 Hrs - End of Tx                                     _49  Post Tx - within 12 hrs             _50  Post Tx -  72 hours after Tx Initiation    _0  Post Tx - 7 Days after Tx  Initiation    _1  Discharge   _2  7-Day Follow-Up   _3  30 -Day Follow-UP  Exam Date / Time:    _08__/_MAR___/_2023___   (DD/MMM/YYYY)                   _15____: __25____ (HH:MM)     or    _4   Time Unknown  Was standing weight Measured? _5  Yes  _6   No             If Yes, Weight:  255.4    lbs          If No, provide rationale: ____________  Was the assessment completed using the Sponsored provided weight scale?  _7  Yes  _8   No         If NO, what scale was used for assessment? ________   Form is based on Page 13 of FASTR Trial  eCRFs    Protocol [id number]                                                                     Physical Assessment completed by Provider  Auditory Testing Below

## 2021-06-20 NOTE — Evaluation (Signed)
Physical Therapy Evaluation ?Patient Details ?Name: Kevin Cardenas ?MRN: [id number] ?DOB: [birth year] ?Today's Date: 06/20/2021 ? ?History of Present Illness ? Pt is 46 yo male who was admitted 06/19/21 for acute hypoxic respiratory failure in setting of acute on chronic systolic heart failure and AKI. PMH includes VSD repair and removal of a pulmonary artery band at age 18, DM, HTN, systolic heart failure, NICM and OSA w/ poor compliance w/ CPAP. ?  ?Clinical Impression ? Pt presents with condition above and deficits mentioned below, see PT Problem List. PTA, he was IND without DME, intermittently steadying self on objects with an UE, and living with his significant other in a 1-level house with 1 STE. Currently, pt limited to taking steps anterior and posterior at EOB due to lines with pt on FASTR trial. Pt was able to mobilize without UE support at a min guard-supervision level without LOB. He does display some mild balance deficits and some mild functional lower extremity weakness. Pt also with decreased aerobic endurance. Pt may benefit from follow-up with HHPT, pending pt's progress especially once not limited by lines. Will continue to follow acutely.   ?   ? ?Recommendations for follow up therapy are one component of a multi-disciplinary discharge planning process, led by the attending physician.  Recommendations may be updated based on patient status, additional functional criteria and insurance authorization. ? ?Follow Up Recommendations Home health PT (vs no PT follow-up pending progress) ? ?  ?Assistance Recommended at Discharge Intermittent Supervision/Assistance  ?Patient can return home with the following ? A little help with bathing/dressing/bathroom;Assistance with cooking/housework;Help with stairs or ramp for entrance ? ?  ?Equipment Recommendations Other (comment) (TBA)  ?Recommendations for Other Services ?    ?  ?Functional Status Assessment Patient has had a recent decline in their functional status  and demonstrates the ability to make significant improvements in function in a reasonable and predictable amount of time.  ? ?  ?Precautions / Restrictions Precautions ?Precautions: Fall ?Precaution Comments: FASTR trial lines limiting pt mobility distance; monitor SpO2 ?Restrictions ?Weight Bearing Restrictions: No  ? ?  ? ?Mobility ? Bed Mobility ?Overal bed mobility: Needs Assistance ?Bed Mobility: Supine to Sit ?  ?  ?Supine to sit: HOB elevated, Supervision ?  ?  ?General bed mobility comments: HOB elevated, pt using bed rail. Supervision for safety and line management, pt needing extra time to complete. ?  ? ?Transfers ?Overall transfer level: Needs assistance ?Equipment used: None ?Transfers: Sit to/from Stand ?Sit to Stand: Min guard ?  ?  ?  ?  ?  ?General transfer comment: Min guard for safety coming to stand from EOB, no LOB. ?  ? ?Ambulation/Gait ?Ambulation/Gait assistance: Min guard ?Gait Distance (Feet): 40 Feet ?Assistive device: None ?Gait Pattern/deviations: Step-through pattern, Decreased step length - right, Decreased step length - left, Decreased stride length, Wide base of support ?Gait velocity: reduced ?Gait velocity interpretation: <1.31 ft/sec, indicative of household ambulator ?  ?General Gait Details: Pt with slow, wide BOS gait pattern stepping anterior <> posterior at EOB, being limited in distance by lines of FASTR trial. Keeps L leg in external rotation. No LOB but mild lateral sway noted, min guard for safety and line management. ? ?Stairs ?  ?  ?  ?  ?  ? ?Wheelchair Mobility ?  ? ?Modified Rankin (Stroke Patients Only) ?  ? ?  ? ?Balance Overall balance assessment: Mild deficits observed, not formally tested ?  ?  ?  ?  ?  ?  ?  ?  ?  ?  ?  ?  ?  ?  ?  ?  ?  ?  ?   ? ? ? ?  Pertinent Vitals/Pain Pain Assessment ?Pain Assessment: Faces ?Faces Pain Scale: Hurts little more ?Pain Location: R hip while walking in place ?Pain Descriptors / Indicators: Grimacing, Guarding ?Pain  Intervention(s): Limited activity within patient's tolerance, Monitored during session, Repositioned  ? ? ?Home Living Family/patient expects to be discharged to:: Private residence ?Living Arrangements: Spouse/significant other ?Available Help at Discharge: Family ?Type of Home: House ?Home Access: Stairs to enter ?  ?Entrance Stairs-Number of Steps: 1 ?  ?Home Layout: One level ?Home Equipment: None ?Additional Comments: Wears CPAP at night but otherwise no supplemental O2 at baseline  ?  ?Prior Function Prior Level of Function : Independent/Modified Independent ?  ?  ?  ?  ?  ?  ?Mobility Comments: endorses intermittent external support while walking, h/o R hip pain. Does not use/own AD at baseline. Denies any recent falls. ?  ?  ? ? ?Hand Dominance  ?   ? ?  ?Extremity/Trunk Assessment  ? Upper Extremity Assessment ?Upper Extremity Assessment: Defer to OT evaluation ?  ? ?Lower Extremity Assessment ?Lower Extremity Assessment: Generalized weakness (L leg externally rotated with gait) ?  ? ?Cervical / Trunk Assessment ?Cervical / Trunk Assessment: Normal  ?Communication  ? Communication: No difficulties  ?Cognition Arousal/Alertness: Awake/alert ?Behavior During Therapy: [facility] for tasks assessed/performed ?Overall Cognitive Status: Within Functional Limits for tasks assessed ?  ?  ?  ?  ?  ?  ?  ?  ?  ?  ?  ?  ?  ?  ?  ?  ?  ?  ?  ? ?  ?General Comments General comments (skin integrity, edema, etc.): SpO2 decreased to <90% on RA with mobility, needed 6L to keep SpO2 >/= 92%, able to get 90-92% on 2-5L though ? ?  ?Exercises    ? ?Assessment/Plan  ?  ?PT Assessment Patient needs continued PT services  ?PT Problem List Decreased strength;Decreased activity tolerance;Decreased range of motion;Decreased balance;Decreased mobility;Cardiopulmonary status limiting activity ? ?   ?  ?PT Treatment Interventions DME instruction;Gait training;Stair training;Functional mobility training;Therapeutic activities;Therapeutic  exercise;Balance training;Neuromuscular re-education;Patient/family education   ? ?PT Goals (Current goals can be found in the Care Plan section)  ?Acute Rehab PT Goals ?Patient Stated Goal: to get better ?PT Goal Formulation: With patient ?Time For Goal Achievement: 07/04/21 ?Potential to Achieve Goals: Good ? ?  ?Frequency Min 3X/week ?  ? ? ?Co-evaluation   ?  ?  ?  ?  ? ? ?  ?AM-PAC PT "6 Clicks" Mobility  ?Outcome Measure Help needed turning from your back to your side while in a flat bed without using bedrails?: A Little ?Help needed moving from lying on your back to sitting on the side of a flat bed without using bedrails?: A Little ?Help needed moving to and from a bed to a chair (including a wheelchair)?: A Little ?Help needed standing up from a chair using your arms (e.g., wheelchair or bedside chair)?: A Little ?Help needed to walk in hospital room?: A Little ?Help needed climbing 3-5 steps with a railing? : A Little ?6 Click Score: 18 ? ?  ?End of Session Equipment Utilized During Treatment: Oxygen ?Activity Tolerance: Patient tolerated treatment well ?Patient left: in bed;with call bell/phone within reach ?Nurse Communication: Mobility status ?PT Visit Diagnosis: Unsteadiness on feet (R26.81);Other abnormalities of gait and mobility (R26.89);Muscle weakness (generalized) (M62.81);Difficulty in walking, not elsewhere classified (R26.2) ?  ? ?Time: 1349-1400 ?PT Time Calculation (min) (ACUTE ONLY): 11 min ? ? ?Charges:   PT Evaluation ?$  PT Eval Low Complexity: 1 Low ?  ?  ?   ? ? ?Moishe Spice, PT, DPT ?Acute Rehabilitation Services  ?Pager: [phone] ?Office: [phone] ? ?Maretta Bees Pettis ?06/20/2021, 2:09 PM ? ?

## 2021-06-20 NOTE — Progress Notes (Addendum)
Advanced Heart Failure Rounding Note  PCP-Cardiologist: [name], MD   Subjective:    Initial co-ox last night 79.5%  CVP 14-15. Diuresing with IV lasix. 1.7L UOP yesterday + unmeasured voids.  Consented to participate in FASTR trial  Scr stable at 1.06  T 100.7 this am. Notes intermittent chills. No cough or dysuria.  More awake this am. Dyspnea improving.  SBP variable, ranging 90s-130s   Objective:   Weight Range: 112.2 kg Body mass index is 39.92 kg/m.   Vital Signs:   Temp:  [98.4 F (36.9 C)-100.7 F (38.2 C)] 100.7 F (38.2 C) (03/09 0343) Pulse Rate:  [64-104] 104 (03/09 0410) Resp:  [12-22] 22 (03/09 0410) BP: (99-135)/(62-107) 99/71 (03/09 0343) SpO2:  [88 %-100 %] 94 % (03/09 0410) FiO2 (%):  [30 %-40 %] 40 % (03/09 0343) Weight:  [108.9 kg-115.8 kg] 112.2 kg (03/09 0343) Last BM Date : 06/19/21  Weight change: Filed Weights   06/19/21 1525 06/19/21 2019 06/20/21 0343  Weight: 115.8 kg 112.6 kg 112.2 kg    Intake/Output:   Intake/Output Summary (Last 24 hours) at 06/20/2021 0700 Last data filed at 06/19/2021 2323 Gross per 24 hour  Intake 240 ml  Output 1700 ml  Net -1460 ml      Physical Exam    General:  Lying in bed. No distress. HEENT: Normal Neck: Supple. JVP 14+. Carotids 2+ bilat; no bruits.  Cor: PMI nondisplaced. Regular rate & rhythm. No rubs, gallops or murmurs. Lungs: Diminished Abdomen: Soft, nontender, + distended. No hepatosplenomegaly.  Extremities: No cyanosis, clubbing, rash, 2+ edema, + RUE PICC Neuro: Alert & orientedx3, cranial nerves grossly intact. moves all 4 extremities w/o difficulty. Affect pleasant   Telemetry   Sinus 90s (personally reviewed)  Labs    CBC Recent Labs    06/19/21 0957 06/19/21 1620 06/19/21 2046 06/20/21 0355  WBC 7.9  --  6.5 6.2  NEUTROABS 6.4  --   --   --   HGB 14.2   < > 14.0 13.4  HCT 45.7   < > 44.3 43.0  MCV 103.9*  --  103.3* 105.1*  PLT 124*  --  117* 121*    < > = values in this interval not displayed.   Basic Metabolic Panel Recent Labs    06/19/21 1609 06/19/21 1620 06/19/21 2046 06/20/21 0355  NA 140 138  --  139  K 4.1 4.1  --  4.0  CL 96*  --   --  97*  CO2 34*  --   --  33*  GLUCOSE 111*  --   --  111*  BUN 17  --   --  15  CREATININE 1.26*  --  1.10 1.06  CALCIUM 8.6*  --   --  8.3*  MG 2.5*  --   --  2.3   Liver Function Tests Recent Labs    06/19/21 0957 06/19/21 1609  AST 35 18  ALT 10 20  ALKPHOS 45 50  BILITOT 2.6* 0.8  PROT 6.4* 6.7  ALBUMIN 3.5 3.5   Recent Labs    06/19/21 0957  LIPASE 32   Cardiac Enzymes No results for input(s): CKTOTAL, CKMB, CKMBINDEX, TROPONINI in the last 72 hours.  BNP: BNP (last 3 results) Recent Labs    06/19/21 0957  BNP 235.5*    ProBNP (last 3 results) No results for input(s): PROBNP in the last 8760 hours.   D-Dimer Recent Labs    06/19/21 1100  DDIMER 0.32   Hemoglobin A1C No results for input(s): HGBA1C in the last 72 hours. Fasting Lipid Panel No results for input(s): CHOL, HDL, LDLCALC, TRIG, CHOLHDL, LDLDIRECT in the last 72 hours. Thyroid Function Tests Recent Labs    06/19/21 0958  TSH 0.984    Other results:   Imaging    DG Chest Portable 1 View  Result Date: 06/19/2021 CLINICAL DATA:  Chest pain, shortness of breath EXAM: PORTABLE CHEST 1 VIEW COMPARISON:  11/04/2019 FINDINGS: Single lead left-sided implanted cardiac device. Stable cardiomegaly. Pulmonary vascular congestion with diffuse bilateral interstitial opacities. No large pleural fluid collection. No pneumothorax. IMPRESSION: Cardiomegaly with pulmonary vascular congestion and diffuse bilateral interstitial opacities, suggestive of CHF with pulmonary edema. Electronically Signed   By: Davina Poke D.O.   On: 06/19/2021 10:20   [place] EKG SITE RITE  Result Date: 06/19/2021 If Site Rite image not attached, placement could not be confirmed due to current cardiac rhythm.     Medications:     Scheduled Medications:  aspirin  81 mg Oral Daily   chlorhexidine  15 mL Mouth Rinse BID   Chlorhexidine Gluconate Cloth  6 each Topical Daily   Chlorhexidine Gluconate Cloth  6 each Topical Daily   dapagliflozin propanediol  10 mg Oral Daily   digoxin  125 mcg Oral Daily   furosemide  60 mg Intravenous BID   heparin  5,000 Units Subcutaneous Q8H   mouth rinse  15 mL Mouth Rinse q12n4p   mupirocin ointment  1 application. Nasal BID   sodium chloride flush  10-40 mL Intracatheter Q12H    Infusions:  sodium chloride      PRN Medications: sodium chloride, acetaminophen, sodium chloride flush    Patient Profile   46 y.o. male who has a history of VSD repair and removal of a pulmonary artery band at age 57, DM, HTN, systolic heart failure, NICM and OSA w/ poor compliance w/ CPAP, admitted for acute hypoxic respiratory failure in setting of acute on chronic systolic heart failure and AKI.   Assessment/Plan   1. Acute Hypoxic/Hyercarbic Respiratory Failure - O2 sats reportedly in the 30s on arrival and cyanotic, improved on NRB - now on HFNC, 6L/min and BiPAP at night - CO2 elevated. Suspect poor compliance w/ CPAP.  - COVID/Flu negative - D-dimer negative, PE unlikely  - CXR w/ pulmonary vascular congestion/ pulmonary edema  - Diuresing with IV lasix - ABG: pH 7.266/CO2 83/HCO3 38 - May need Diamox   2. Acute on Chronic Systolic Heart Failure - Nonischemic cardiomyopathy.  RHC in 4/16 showed preserved cardiac index by thermodilution but low by Fick.   - Last echo in 8/21 showed EF 30-35%, normal RV, no residual VSD, no definite pulmonic stenosis.   - Now w/ NYHA Class III-IIIb symptoms. Volume assessment difficult given body habitus, but appears fluid overloaded. BNP only mildly elevated at 235.5, but suspect falsely low in setting of obesity. CXR shows pulmonary edema. Med compliance has been questionable. - Initial co-ox okay. Repeat this am.  -  Volume overloaded CVP 14-15. Continue IV lasix 60 mg BID. Consented to participate in FASTR trial. - repeat echo pending - Entresto on hold d/t AKI and hyperkalemia. Eventually try to restart - Add back spiro 12.5 mg daily - hold Coreg  - continue Farxiga 10 mg  - hold Bidil w/ soft BP  - continue digoxin 0.125 (dig level ok, 0.2) - Place UNNA boots - May need repeat RHC prior to d/c  -  Has [employer] ICD. Not candidate for CRT-D upgrade w/ narrow QRS 125 ms    3. AKI  - Baseline SCr 1.1 - 1.5 on admit, in setting of HF. ? Low output/cardiorenal. - Scr now back to baseline at 1.06 with diuresis - follow BMP    4. Hyperkalemia - K 5.4 on admit, in setting of AKI  - 4.0 today - Restart spiro 12.5 mg daily - Hold Entresto   5. OSA - needs to improve compliance w/ CPAP  - BiPAP QHS while here   7. Thrombocytopenia - plts 121 - appears chronic, prior baseline ~140K  - monitor    8. Congenital Heart Disease - VSD repair and removal of a pulmonary artery band at age 62 - echo 8/21 showed no significant residual VSD, normal RV - repeat echo pending   9. Type 2DM  - SSI - continue Farxiga 10 mg  - A1c 6.4% in January. Recheck.   10. Poor Med Compliance - would benefit from paramedicine at d/c - will engage HF SW team   11. Fever - T 100.71F this am, no leukocytosis - Denies cough. + Chills. - CXR yesterday with pulmonary vascular congestion + bilateral interstitial opacities suggestive of CHF with pulmonary edema - Repeat CXR. Check procalcitonin. - Resp panel negative for COVID and influenza A/B   Length of Stay: 1  [facility], [place], PA-C  06/20/2021, 7:00 AM  Advanced Heart Failure Team Pager [phone] (M-F; 7a - 5p)  Please contact [facility] for night-coverage after hours (5p -7a ) and weekends on [email or web address]   Patient seen with PA, agree with the above note.   Patient diuresed overnight with IV Lasix, CVP 14-15 today. Co-ox 80% with creatinine 1.06.  SBP  90s-100s  On Bipap overnight, now nasal cannula.  CXR with pulmonary edema.  Had low grade fever to 100.7.   General: NAD Neck: JVP 14 cm, no thyromegaly or thyroid nodule.  Lungs: Clear to auscultation bilaterally with normal respiratory effort. CV: Nondisplaced PMI.  Heart regular S1/S2, no S3/S4, 1/6 SEM RUSB.  1+ ankle edema.  Abdomen: Soft, nontender, no hepatosplenomegaly, moderate distention.  Skin: Intact without lesions or rashes.  Neurologic: Alert and oriented x 3.  Psych: Normal affect. Extremities: No clubbing or cyanosis.  HEENT: Normal.   1. Acute on chronic systolic CHF: Nonischemic cardiomyopathy.  RHC in 4/16 showed preserved cardiac index by thermodilution but low by Fick.   Last echo in 8/21 showed EF 30-35%, normal RV, no residual VSD, no definite pulmonic stenosis.  He has a [employer] ICD. He has an IVCD but QRS not long enough for CRT.  NYHA class IV at admission with hypoxemia.  BP has been soft in the hospital, not sure if he is actually compliant with his meds though he says that he is.  CVP 14-15, co-ox 80%.  - Coreg, Entresto, Bidil on hold for now with soft BP.    - Restart spironolactone 12.5 daily.  - Continue digoxin 0.125.    - Continue Farxiga 10 mg daily.   - Probably not Cardiomems candidate due to Medicaid.  - Echo this admission.  - Needs further diuresis => now in FASTR trial, starting treatment arm today.  - Will likely aim for LHC/RHC prior to discharge (remote coronary angiography).  2. H/o VSD: No residual shunt noted 8/21 echo.  Last RHC also did not suggest any significant left to right shunting.  3. Pulmonary stenosis: Very mild by 7/16 echo, no significant  stenosis 8/21 echo.  Mild PS murmur on exam.  - Repeat echo this admission.  4. OSA/?OHS: On CPAP at home, markedly hypercarbic at admission.   - With marked hypercarbic respiratory failure, would like pulmonary to see him while he is here.  5. ID: Low grade fever to 100.7.  CXR with  pulmonary edema.  - Send procalcitonin.  - Blood cultures. 6. AKI: Creatinine 1.5 at admission, 1.06 today.     SITE # [id number]    SUBJECT#  ____      SUBJECT INITIALS: ___  INTERVAL: _0  Screening / Baseline _1   24 Hrs - During Tx       _2  48 Hrs - During Tx  _3   72 Hrs - End of Tx   _4  Post Tx - Within 24 Hrs _5  Post Tx - 72 hours after Tx Initiation    _6  Post Tx - 7 Days after Tx  Initiation    _7  Discharge   _8  7-Day Follow-Up   _9  30 -Day Follow-UP  Exam Performed:  _10  Yes  _11  No Exam Date; __03/09/2023__________  (DD/MMM/YYYY)                     _09___:_03_____ (HH;MM)    _12   Time Unknown   Temperature: 100.7_______ F  Height: (Baseline only)   ____ _13  Inches  _14   cm _15  Not Done   _16   NA  Estimated pounds of fluid overload:  ____15_______ lbs  Is Subject experiencing Shortness of Breath?  _17  Yes   _18   No  Dyspnea VAS: (10 being the worst)   _19  1 _20  2 _21  3 _22  4 _23  5 _24  6 _25  7 _26 8 _27  9 _28  10  Is Subject experiencing Orthopnea?  _29  Yes    _30  No  Is there evidence of Rales?  _31  Yes  _32   No    If Yes, Characterize:   _33  Rhonchi     _34  Crackles     _35  Wheezing     _36  Stridor Ascites? _37   Yes  _38   No  Leg Edema?  _39  Yes    _40  No         If yes, grade of edema:  _41  1+  _42  2+  _43  3+  _44  4+  Sacral Edema?  _45  Yes   _46  No        If yes, grade of edema:    _47  1+  _48  2+  _49  3+  _50  4+  Jugular Vein Distention (JVD)?:  _51  Yes  _52   No        If yes, provide measurement:   _14___ cm  NYHA Classification:  _53  I   _54  II  _55  III  _56   IV  _57   N/A  Has subject expressed presence of thirst?  _58  Yes  _59   No       If Yes, rate of thirst level (10 being the worst): _60  1 _61  2 _62  3 _63  4 _64  5 _65  6 _66  7 _67 8 _68  9 _69  10   ++++  PROVIDER NOTES OR COMMENTS:      [name] 06/20/2021 9:02 AM

## 2021-06-20 NOTE — Progress Notes (Signed)
? ?  Echocardiogram ?2D Echocardiogram has been performed. ? ?Kevin Cardenas ?06/20/2021, 12:05 PM ?

## 2021-06-20 NOTE — Plan of Care (Signed)
?  Problem: Education: ?Goal: Knowledge of General Education information will improve ?Description: Including pain rating scale, medication(s)/side effects and non-pharmacologic comfort measures ?Outcome: Progressing ?  ?Problem: Health Behavior/Discharge Planning: ?Goal: Ability to manage health-related needs will improve ?Outcome: Progressing ?  ?Problem: Clinical Measurements: ?Goal: Ability to maintain clinical measurements within normal limits will improve ?Outcome: Progressing ?Goal: Will remain free from infection ?Outcome: Progressing ?Goal: Diagnostic test results will improve ?Outcome: Progressing ?Goal: Respiratory complications will improve ?Outcome: Progressing ?Goal: Cardiovascular complication will be avoided ?Outcome: Progressing ?  ?Problem: Activity: ?Goal: Risk for activity intolerance will decrease ?Outcome: Progressing ?  ?Problem: Nutrition: ?Goal: Adequate nutrition will be maintained ?Outcome: Progressing ?  ?Problem: Coping: ?Goal: Level of anxiety will decrease ?Outcome: Progressing ?  ?Problem: Elimination: ?Goal: Will not experience complications related to bowel motility ?Outcome: Progressing ?Goal: Will not experience complications related to urinary retention ?Outcome: Progressing ?  ?Problem: Pain Managment: ?Goal: General experience of comfort will improve ?Outcome: Progressing ?  ?Problem: Safety: ?Goal: Ability to remain free from injury will improve ?Outcome: Progressing ?  ?Problem: Skin Integrity: ?Goal: Risk for impaired skin integrity will decrease ?Outcome: Progressing ?  ?Problem: Education: ?Goal: Ability to demonstrate management of disease process will improve ?Outcome: Progressing ?Goal: Ability to verbalize understanding of medication therapies will improve ?Outcome: Progressing ?Goal: Individualized Educational Video(s) ?Outcome: Progressing ?  ?Problem: Activity: ?Goal: Capacity to carry out activities will improve ?Outcome: Progressing ?  ?

## 2021-06-20 NOTE — Research (Signed)
? ?  FASTR Treatment Initiation ? ?SITE #   [id number]            SUBJECT #:   07         SUBJECT INITIALS: BNW ? ?Date/Time of hospital admission  _08___/_MAR____/_2023____ (DD/MMM/YYYY)   _20____:_11___ ? ? ?Was iv placed before hospital admission _0  YES   or   _1  NO ?IV placed Date/Time _08___/_MAR____/_2023____ (DD/MMM/YYYY)   _22____:_23___ ?Was a Y-Connector used _2  YES   or   _3  NO ? ? ?Date/TIime of Start IV Diuretic Dose _09___/_MAR____/_2023____ (DD/MMM/YYYY)   _09____:_09___ ?Physician Performing procedure (Full name) [name], MD_____________________ ? ?Was therapy performed _4  YES   or   _5  NO ? ? ?Date/Time of treatment Initiation  _09___/_MAR____/_2023____ (DD/MMM/YYYY)   _09____:_09___ ? ?Assessment _09___/_Mar____/_2023____ (DD/MMM/YYYY)   _09____:_44___ ? ?Standing Weight Measurement _246.6_____ lb ? ?Was the assessment completed using the Sponsor provided weight scale?  _6  YES   or   _7  NO ? ?Foley Placed Date/Time _09___/_MAR____/_2023____ (DD/MMM/YYYY)   _06____:_48___ ? ? ?Were Core labs collected _8  YES   or   _9  NO  _09___/_MAR_/_2023____ (DD/MMM/YYYY)   _09____:_19___ ? ?Urine collection at 20-Jun-2021 at 0957 ?   ?

## 2021-06-21 ENCOUNTER — Other Ambulatory Visit: Payer: Self-pay | Admitting: Cardiology

## 2021-06-21 LAB — COMPREHENSIVE METABOLIC PANEL
ALT: 17 U/L (ref 0–44)
ALT: 20 U/L (ref 0–44)
AST: 15 U/L (ref 15–41)
AST: 15 U/L (ref 15–41)
Albumin: 3.8 g/dL (ref 3.5–5.0)
Albumin: 3.6 g/dL (ref 3.5–5.0)
Alkaline Phosphatase: 47 U/L (ref 38–126)
Alkaline Phosphatase: 50 U/L (ref 38–126)
Anion gap: 11 (ref 5–15)
Anion gap: 11 (ref 5–15)
BUN: 12 mg/dL (ref 6–20)
BUN: 16 mg/dL (ref 6–20)
CO2: 40 mmol/L — ABNORMAL HIGH (ref 22–32)
CO2: 40 mmol/L — ABNORMAL HIGH (ref 22–32)
Calcium: 7.6 mg/dL — ABNORMAL LOW (ref 8.9–10.3)
Calcium: 7.6 mg/dL — ABNORMAL LOW (ref 8.9–10.3)
Chloride: 85 mmol/L — ABNORMAL LOW (ref 98–111)
Chloride: 86 mmol/L — ABNORMAL LOW (ref 98–111)
Creatinine, Ser: 1.03 mg/dL (ref 0.61–1.24)
Creatinine, Ser: 0.99 mg/dL (ref 0.61–1.24)
GFR, Estimated: >60 mL/min (ref >60)
GFR, Estimated: >60 mL/min (ref >60)
Glucose, Bld: 207 mg/dL — ABNORMAL HIGH (ref 70–99)
Glucose, Bld: 136 mg/dL — ABNORMAL HIGH (ref 70–99)
Potassium: 3.1 mmol/L — ABNORMAL LOW (ref 3.5–5.1)
Potassium: 3.6 mmol/L (ref 3.5–5.1)
Sodium: 136 mmol/L (ref 135–145)
Sodium: 137 mmol/L (ref 135–145)
Total Bilirubin: 0.6 mg/dL (ref 0.3–1.2)
Total Bilirubin: 0.9 mg/dL (ref 0.3–1.2)
Total Protein: 7.2 g/dL (ref 6.5–8.1)
Total Protein: 7.3 g/dL (ref 6.5–8.1)

## 2021-06-21 LAB — CBC
HCT: 42.4 % (ref 39.0–52.0)
Hemoglobin: 13.6 g/dL (ref 13.0–17.0)
MCH: 33.1 pg (ref 26.0–34.0)
MCHC: 32.1 g/dL (ref 30.0–36.0)
MCV: 103.2 fL — ABNORMAL HIGH (ref 80.0–100.0)
Platelets: 128 10*3/uL — ABNORMAL LOW (ref 150–400)
RBC: 4.11 MIL/uL — ABNORMAL LOW (ref 4.22–5.81)
RDW: 12.8 % (ref 11.5–15.5)
WBC: 5 10*3/uL (ref 4.0–10.5)
nRBC: 0 % (ref 0.0–0.2)

## 2021-06-21 LAB — COOXEMETRY PANEL
Carboxyhemoglobin: 1.7 % — ABNORMAL HIGH (ref 0.5–1.5)
Methemoglobin: <0.7 % (ref 0.0–1.5)
O2 Saturation: 82.8 %
Total hemoglobin: 13.7 g/dL (ref 12.0–16.0)

## 2021-06-21 LAB — GLUCOSE, CAPILLARY
Glucose-Capillary: 126 mg/dL — ABNORMAL HIGH (ref 70–99)
Glucose-Capillary: 144 mg/dL — ABNORMAL HIGH (ref 70–99)
Glucose-Capillary: 112 mg/dL — ABNORMAL HIGH (ref 70–99)
Glucose-Capillary: 180 mg/dL — ABNORMAL HIGH (ref 70–99)

## 2021-06-21 LAB — BLOOD CULTURE ID PANEL (REFLEXED) - BCID2

## 2021-06-21 LAB — PROCALCITONIN: Procalcitonin: <0.1 ng/mL

## 2021-06-21 LAB — MAGNESIUM
Magnesium: 1.8 mg/dL (ref 1.7–2.4)
Magnesium: 2.2 mg/dL (ref 1.7–2.4)

## 2021-06-21 MED ORDER — SPIRONOLACTONE 25 MG PO TABS
25.0000 mg | ORAL_TABLET | Freq: Every day | ORAL | Status: DC
Start: 2021-06-21 — End: 2021-06-24
  Administered 2021-06-21 – 2021-06-24 (×4): 25 mg via ORAL
  Filled 2021-06-21 (×4): qty 1

## 2021-06-21 MED ORDER — SODIUM CHLORIDE 0.9% FLUSH
3.0000 mL | Freq: Two times a day (BID) | INTRAVENOUS | Status: DC
  Administered 2021-06-21 – 2021-06-24 (×5): 3 mL via INTRAVENOUS

## 2021-06-21 MED ORDER — STUDY - FASTR - FUROSEMIDE (LASIX) 10 MG/ML IV INFUSION (PI-BENISMHON)
INTRAVENOUS | Status: AC
  Filled 2021-06-21: qty 50

## 2021-06-21 MED ORDER — ENOXAPARIN SODIUM 40 MG/0.4ML IJ SOSY
40.0000 mg | PREFILLED_SYRINGE | Freq: Every day | INTRAMUSCULAR | Status: DC
  Administered 2021-06-21 – 2021-06-23 (×3): 40 mg via SUBCUTANEOUS
  Filled 2021-06-21 (×3): qty 0.4

## 2021-06-21 MED ORDER — SIMVASTATIN 20 MG PO TABS
20.0000 mg | ORAL_TABLET | Freq: Every day | ORAL | Status: DC
  Administered 2021-06-21 – 2021-06-24 (×3): 20 mg via ORAL
  Filled 2021-06-21 (×3): qty 1

## 2021-06-21 MED ORDER — MAGNESIUM SULFATE 2 GM/50ML IV SOLN
2.0000 g | Freq: Once | INTRAVENOUS | Status: AC
  Administered 2021-06-21: 2 g via INTRAVENOUS
  Filled 2021-06-21: qty 50

## 2021-06-21 MED ORDER — POTASSIUM CHLORIDE CRYS ER 20 MEQ PO TBCR
40.0000 meq | EXTENDED_RELEASE_TABLET | ORAL | Status: AC
  Administered 2021-06-21 (×4): 40 meq via ORAL
  Filled 2021-06-21 (×4): qty 2

## 2021-06-21 MED ORDER — LOSARTAN POTASSIUM 25 MG PO TABS: 12.5000 mg | ORAL_TABLET | Freq: Every day | ORAL | Status: DC

## 2021-06-21 MED ORDER — LOSARTAN POTASSIUM 25 MG PO TABS
12.5000 mg | ORAL_TABLET | Freq: Two times a day (BID) | ORAL | Status: DC
  Administered 2021-06-21 – 2021-06-23 (×5): 12.5 mg via ORAL
  Filled 2021-06-21 (×5): qty 1

## 2021-06-21 NOTE — Progress Notes (Signed)
Plan for RHC/LHC on Monday.  ? ? ?Darrie Macmillan NP-C  ?11:22 AM ? ?

## 2021-06-21 NOTE — Research (Addendum)
24 Hours Post Treatment Initiation ?SITE #   001            SUBJECT #:   07         SUBJECT INITIALS: BNW ? ?Treatment Assessment: (24 hours after Treatment initiation or Therapy End IF < 24 hours) ? ?Date / time of Assessment: Date / Time:  _10__ / _MAR___/ _2023___   at _08__:_09___       ? ?Estimated excess fluid volume: _____5______ lbs or ____ kg ? ?Diuretic Administration:  _0  Continuous    _1   Bolus      _2   Both ?  (Update Diuretic Log)       ? ?Total Infused at the end of dose finding phase (diuretic) 472 mg ? ?Total infused (diuretic): __553____ mg ? ?Total Urine: __13543______ ml ? ?Urine Production Rate: __391____ ml/hr ? ?Total saline infused: _5380_____ ml ? ?Pump infusion rate: _2.7____ ml/hr ?FLUID BALANCE:  ? ?Date/Time of Assessment _10__/_MAR___/_2023___   (DD/MMM/YYYY)  @ 08:10 ?  ? ?Input ?DMS total Infusion _5380_____ mL _3  NA ?Other IV Fluid Input _______ mL _4  NA ?Oral Fluid Input _1200_______ ML _5  NA ?Total Input _6580______ mL ? ? ?Output ?Total Urine _17500_______ mL _6  NA ?Missed Urine ________ mL _7  NA ?Other Fluid Output _______ mL _8  NA ?Total Output _17500______ mL ? ? ?Form is based on Page 13 of FASTR Trial  eCRFs    Protocol [id number] ? ? ?WEIGHT ASSESSMENT ? ? ?Exam Date / Time:    _10__/_MAR___/_2023___   (DD/MMM/YYYY)   ? ?        _07____: _54_____ (HH:MM)     or    _9   Time Unknown ? ?Was standing weight Measured? _10  Yes  _11   No  ?   ?        If Yes, Weight:  _236.1_          lbs ? ?        If No, provide rationale: ____________ ? ?Was the assessment completed using the Sponsored provided weight scale?  _12  Yes  _13   No ? ?       If NO, what scale was used for assessment? ________ ? ?Was URINE sample collected and processed for Site LAB?      ?                 _14  YES     _15  NO     _16  Not required per protocol ?Collection Date / Time  :    _10__/_MAR___/_2023___     _08__:_16___     _17   Time Unknown ?           (DD/MMM/YYYY)  (HH:MM) ? ? ?Date/Time of Assessment Date/Time  of Assessment _10__/_MAR___/_2023___   (DD/MMM/YYYY)  ? ? ?Please indicate if the following have occurred since the last assessment of this patient: ?(Only applicable from 28U from Init visit up to Discharge visit) ? ?  ?Acute kidney injury, defined as use of renal replacement therapy (RRT). RRT includes hemodialysis, hemofiltration, hemodiafiltration, peritoneal dialysis, and kidney transplant. Isolated ultrafiltration without dialysis will not be considered RRT. *Note- acute kidney injury defined as KDIGO stage 2 or greateris captured in the Serum Chemistry labs CRF  _18   Yes or _19  No ? ?Symptomatic hypotension, defined as sustained hypotension (<13 mmHg systolic) with corresponding symptoms that require intervention(i.e., fluid bolus or vasoactive medication) _20   Yes or _21  No ?  ?  ?Hypertensive emergency, defined as blood pressure greater  than 180/120 mmHg with end-organ damage (cardiovascular, cerebrovascularor renal)  _0   Yes or _1  No  ? ? ? ? ? ? ?

## 2021-06-21 NOTE — Progress Notes (Addendum)
Advanced Heart Failure Rounding Note  PCP-Cardiologist: [name], MD   Subjective:   Admit weight 255--->235.6 pounds  FASTR Trial   Fastr 27 mg /hr  Brisk diuresis -7.9 liters.   Afebrile. Bld Cx pending.   Denies chills. Denies SOB.   Objective:   Weight Range: 106.9 kg Body mass index is 38.03 kg/m.   Vital Signs:   Temp:  [97.6 F (36.4 C)-98.8 F (37.1 C)] 98.8 F (37.1 C) (03/10 0433) Pulse Rate:  [82-89] 89 (03/10 0433) Resp:  [17-20] 20 (03/10 0433) BP: (97-121)/(66-76) 97/66 (03/10 0433) SpO2:  [92 %-98 %] 94 % (03/10 0433) FiO2 (%):  [40 %] 40 % (03/10 0023) Weight:  [106.9 kg-111.9 kg] 106.9 kg (03/10 0433) Last BM Date : 06/20/21  Weight change: Filed Weights   06/20/21 0944 06/20/21 1439 06/21/21 0433  Weight: 111.9 kg 111.1 kg 106.9 kg    Intake/Output:   Intake/Output Summary (Last 24 hours) at 06/21/2021 0719 Last data filed at 06/21/2021 0715 Gross per 24 hour  Intake 6516.8 ml  Output 14250 ml  Net -7733.2 ml      Physical Exam  CVP 8-9 General:   No resp difficulty HEENT: normal Neck: supple. JVP 7-8  Carotids 2+ bilat; no bruits. No lymphadenopathy or thryomegaly appreciated. Cor: PMI nondisplaced. Regular rate & rhythm. No rubs, gallops or murmurs. Lungs: clear Abdomen: soft, nontender, nondistended. No hepatosplenomegaly. No bruits or masses. Good bowel sounds. Extremities: no cyanosis, clubbing, rash, edema. R and LLE unna boots.  RUE PICC Neuro: alert & orientedx3, cranial nerves grossly intact. moves all 4 extremities w/o difficulty. Affect pleasant GU: foley clear urine    Telemetry  SR 80s personally checked.   Labs    CBC Recent Labs    06/19/21 0957 06/19/21 1620 06/20/21 0919 06/21/21 0150  WBC 7.9   < > 5.5 5.0  NEUTROABS 6.4  --   --   --   HGB 14.2   < > 13.7 13.6  HCT 45.7   < > 43.4 42.4  MCV 103.9*   < > 103.1* 103.2*  PLT 124*   < > 122* 128*   < > = values in this interval not displayed.    Basic Metabolic Panel Recent Labs    06/20/21 0919 06/20/21 2100  NA 139 140  K 3.9 3.5  CL 97* 89*  CO2 36* 41*  GLUCOSE 154* 112*  BUN 14 12  CREATININE 1.00 1.08  CALCIUM 8.4* 7.8*  MG 2.3 1.8   Liver Function Tests Recent Labs    06/20/21 0919 06/20/21 2100  AST 15 14*  ALT 19 17  ALKPHOS 47 48  BILITOT 0.8 0.6  PROT 6.6 6.9  ALBUMIN 3.5 3.4*   Recent Labs    06/19/21 0957  LIPASE 32   Cardiac Enzymes No results for input(s): CKTOTAL, CKMB, CKMBINDEX, TROPONINI in the last 72 hours.  BNP: BNP (last 3 results) Recent Labs    06/19/21 0957  BNP 235.5*    ProBNP (last 3 results) No results for input(s): PROBNP in the last 8760 hours.   D-Dimer Recent Labs    06/19/21 1100  DDIMER 0.32   Hemoglobin A1C Recent Labs    06/20/21 0355  HGBA1C 6.1*   Fasting Lipid Panel No results for input(s): CHOL, HDL, LDLCALC, TRIG, CHOLHDL, LDLDIRECT in the last 72 hours. Thyroid Function Tests Recent Labs    06/19/21 0958  TSH 0.984    Other results:   Imaging  DG CHEST PORT 1 VIEW  Result Date: 06/20/2021 CLINICAL DATA:  Short of breath, respiratory failure EXAM: PORTABLE CHEST 1 VIEW COMPARISON:  06/19/2021 FINDINGS: Cardiac enlargement.  AICD unchanged in position. Vascular congestion and mild edema has improved in the interval. No effusion. Right arm PICC tip in the SVC IMPRESSION: Right arm PICC tip mid SVC Improvement in congestive heart failure and mild edema. Electronically Signed   By: Franchot Gallo M.D.   On: 06/20/2021 08:33   ECHOCARDIOGRAM COMPLETE  Result Date: 06/20/2021    ECHOCARDIOGRAM REPORT   Patient Name:   Kevin Cardenas Date of Exam: 06/20/2021 Medical Rec #:  [id number]       Height:       66.0 in Accession #:    [id number]      Weight:       246.6 lb Date of Birth:  April 21, 1975       BSA:          2.186 m Patient Age:    46 years        BP:           118/109 mmHg Patient Gender: M               HR:           73 bpm. Exam  Location:  Inpatient Procedure: 2D Echo, Cardiac Doppler and Color Doppler Indications:    I50.21 CHF  History:        Patient has prior history of Echocardiogram examinations, most                 recent 11/18/2019. CHF and Cardiomyopathy; Risk                 Factors:Hypertension and Diabetes. VSD.  Sonographer:    Beryle Beams Referring Phys: [id number] BRITTAINY M SIMMONS  Sonographer Comments: Suboptimal parasternal window, suboptimal apical window, suboptimal subcostal window and patient is morbidly obese. IMPRESSIONS  1. Left ventricular ejection fraction, by estimation, is 30 to 35%. The left ventricle has moderately decreased function. The left ventricle demonstrates global hypokinesis. There is mild concentric left ventricular hypertrophy. Left ventricular diastolic parameters are indeterminate.  2. There is no evidence of residual VSD by color or spectral Doppler, suspect that prior VSD was perimembranous.  3. History of congenital pumonartery banding without residual pulmonic stenosis by spectral Doppler. Peak gradient 6 mm Hg.  4. Right ventricular systolic function is normal. The right ventricular size is moderately enlarged.  5. The mitral valve is normal in structure. No evidence of mitral valve regurgitation. No evidence of mitral stenosis.  6. The aortic valve was not well visualized. Aortic valve regurgitation is not visualized. Comparison(s): No significant change from prior study. FINDINGS  Left Ventricle: Left ventricular ejection fraction, by estimation, is 30 to 35%. The left ventricle has moderately decreased function. The left ventricle demonstrates global hypokinesis. The left ventricular internal cavity size was normal in size. There is mild concentric left ventricular hypertrophy. Abnormal (paradoxical) septal motion, consistent with RV pacemaker. Left ventricular diastolic parameters are indeterminate. Right Ventricle: The right ventricular size is moderately enlarged. No increase in right  ventricular wall thickness. Right ventricular systolic function is normal. Left Atrium: Left atrial size was normal in size. Right Atrium: Right atrial size was normal in size. Pericardium: Trivial pericardial effusion is present. Mitral Valve: The mitral valve is normal in structure. No evidence of mitral valve regurgitation. No evidence of mitral valve stenosis. Tricuspid Valve: The tricuspid  valve is normal in structure. Tricuspid valve regurgitation is not demonstrated. No evidence of tricuspid stenosis. Aortic Valve: The aortic valve was not well visualized. Aortic valve regurgitation is not visualized. Aortic valve mean gradient measures 2.0 mmHg. Aortic valve peak gradient measures 3.4 mmHg. Aortic valve area, by VTI measures 2.39 cm. Pulmonic Valve: History of congenital pumonartery banding without residual pulmonic stenosis by spectral Doppler. Peak gradient 6 mm Hg. The pulmonic valve was not well visualized. Pulmonic valve regurgitation is not visualized. No evidence of pulmonic stenosis. Aorta: The aortic root and ascending aorta are structurally normal, with no evidence of dilitation. IAS/Shunts: No atrial level shunt detected by color flow Doppler. Additional Comments: A device lead is visualized.  LEFT VENTRICLE PLAX 2D LVIDd:         5.00 cm      Diastology LVIDs:         3.70 cm      LV e' medial:    9.36 cm/s LV PW:         1.50 cm      LV E/e' medial:  10.2 LV IVS:        1.10 cm      LV e' lateral:   12.90 cm/s LVOT diam:     1.90 cm      LV E/e' lateral: 7.4 LV SV:         37 LV SV Index:   17 LVOT Area:     2.84 cm  LV Volumes (MOD) LV vol d, MOD A2C: 124.0 ml LV vol d, MOD A4C: 133.0 ml LV vol s, MOD A2C: 87.6 ml LV vol s, MOD A4C: 86.4 ml LV SV MOD A2C:     36.4 ml LV SV MOD A4C:     133.0 ml LV SV MOD BP:      49.3 ml RIGHT VENTRICLE            IVC RV Basal diam:  5.90 cm    IVC diam: 1.90 cm RV Mid diam:    4.40 cm RV S prime:     8.70 cm/s TAPSE (M-mode): 1.9 cm LEFT ATRIUM            Index        RIGHT ATRIUM           Index LA diam:      2.50 cm 1.14 cm/m   RA Area:     20.00 cm LA Vol (A4C): 26.7 ml 12.21 ml/m  RA Volume:   66.30 ml  30.33 ml/m  AORTIC VALVE                    PULMONIC VALVE AV Area (Vmax):    2.18 cm     PV Vmax:       1.21 m/s AV Area (Vmean):   2.06 cm     PV Vmean:      74.400 cm/s AV Area (VTI):     2.39 cm     PV VTI:        0.271 m AV Vmax:           92.30 cm/s   PV Peak grad:  5.9 mmHg AV Vmean:          61.100 cm/s  PV Mean grad:  3.0 mmHg AV VTI:            0.154 m AV Peak Grad:      3.4 mmHg AV Mean Grad:  2.0 mmHg LVOT Vmax:         71.10 cm/s LVOT Vmean:        44.300 cm/s LVOT VTI:          0.130 m LVOT/AV VTI ratio: 0.84  AORTA Ao Root diam: 2.60 cm Ao Asc diam:  3.40 cm MITRAL VALVE MV Area (PHT): 3.08 cm    SHUNTS MV Decel Time: 246 msec    Systemic VTI:  0.13 m MV E velocity: 95.20 cm/s  Systemic Diam: 1.90 cm MV A velocity: 69.85 cm/s MV E/A ratio:  1.36 Rudean Haskell MD Electronically signed by Rudean Haskell MD Signature Date/Time: 06/20/2021/12:24:44 PM    Final      Medications:     Scheduled Medications:  aspirin  81 mg Oral Daily   chlorhexidine  15 mL Mouth Rinse BID   Chlorhexidine Gluconate Cloth  6 each Topical Daily   Chlorhexidine Gluconate Cloth  6 each Topical Daily   dapagliflozin propanediol  10 mg Oral Daily   digoxin  125 mcg Oral Daily   FASTR furosemide       heparin  5,000 Units Subcutaneous Q8H   mouth rinse  15 mL Mouth Rinse q12n4p   mupirocin ointment  1 application. Nasal BID   sodium chloride flush  10-40 mL Intracatheter Q12H   spironolactone  12.5 mg Oral Daily    Infusions:  sodium chloride 250 mL (06/20/21 0933)   FASTR furosemide 27 mg/hr (06/21/21 0315)    PRN Medications: sodium chloride, acetaminophen, sodium chloride flush    Patient Profile   46 y.o. male who has a history of VSD repair and removal of a pulmonary artery band at age 55, DM, HTN, systolic heart failure,  NICM and OSA w/ poor compliance w/ CPAP, admitted for acute hypoxic respiratory failure in setting of acute on chronic systolic heart failure and AKI.   Assessment/Plan   1. Acute Hypoxic/Hyercarbic Respiratory Failure - O2 sats reportedly in the 30s on arrival and cyanotic, improved on NRB - now on HFNC, 6L/min and BiPAP at night - CO2 elevated. Suspect poor compliance w/ CPAP.  - COVID/Flu negative - D-dimer negative, PE unlikely  - CXR w/ pulmonary vascular congestion/ pulmonary edema  -Wean oxygen today. Sats stable.    2. Acute on Chronic Systolic Heart Failure - Nonischemic cardiomyopathy.  RHC in 4/16 showed preserved cardiac index by thermodilution but low by Fick.   - Last echo in 8/21 showed EF 30-35%, normal RV, no residual VSD, no definite pulmonic stenosis.   - Now w/ NYHA Class III-IIIb symptoms. Volume assessment difficult given body habitus, but appears fluid overloaded. BNP only mildly elevated at 235.5, but suspect falsely low in setting of obesity. CXR shows pulmonary edema. Med compliance has been questionable. -  FASTR trial. - Echo EF 30-35% RV moderately reduced.  - CVP 8-9.  Check BMET now. May be able to stop Repreive. Delene Loll on hold d/t AKI and hyperkalemia. Eventually try to restart - Continue spiro 12.5 mg daily - hold Coreg  - continue Farxiga 10 mg  - hold Bidil w/ soft BP  - continue digoxin 0.125 (dig level ok, 0.2) - Continue UNNA boots - May need repeat RHC prior to d/c  - Has [employer] ICD. Not candidate for CRT-D upgrade w/ narrow QRS 125 ms    3. AKI  - Baseline SCr 1.1 - 1.5 on admit, in setting of HF. ? Low output/cardiorenal. - BMET peniding.    4. Hyperkalemia -  K 5.4 on admit, in setting of AKI  - Continue spiro 12.5 mg daily - Hold Entresto - BMET pending.    5. OSA - needs to improve compliance w/ CPAP  - BiPAP QHS while here   7. Thrombocytopenia - plts 128 - appears chronic, prior baseline ~140K  - monitor    8.  Congenital Heart Disease - VSD repair and removal of a pulmonary artery band at age 89 - echo 8/21 showed no significant residual VSD, normal RV - repeat echo pending   9. Type 2DM  - SSI - continue Farxiga 10 mg  - A1c 6.4% in January. Recheck.   10. Poor Med Compliance - would benefit from paramedicine at d/c - will engage HF SW team   11. Fever - Afebrile - Procalcitonin < 0.1 - Resp panel negative for COVID and influenza A/B - Bld CX  Gram + Cocci - final results pending.   Check BMET now.     SITE # 001    SUBJECT#  ____      SUBJECT INITIALS: ___  INTERVAL: _0  Screening / Baseline _1   24 Hrs - During Tx       _2  48 Hrs - During Tx  _3   72 Hrs - End of Tx   _4  Post Tx - Within 24 Hrs _5  Post Tx - 72 hours after Tx Initiation    _6  Post Tx - 7 Days after Tx  Initiation    _7  Discharge   _8  7-Day Follow-Up   _9  30 -Day Follow-UP  Exam Performed:  _10  Yes  _11  No Exam Date; __3/10/2023__________  (DD/MMM/YYYY)                     ____:______ (HH;MM)    _12   Time Unknown   Temperature: _98.8  F  Height: (Baseline only)   ____ _13  Inches  _14   cm _15  Not Done   _16   NA  Estimated pounds of fluid overload:  5 lbs  Is Subject experiencing Shortness of Breath?  _17  Yes   _18   No  Dyspnea VAS: (10 being the worst)   _19  1 _20  2 _21  3 _22  4 _23  5 _24  6 _25  7 _26 8 _27  9 _28  10  Is Subject experiencing Orthopnea?  _29  Yes    _30  No  Is there evidence of Rales?  _31  Yes  _32   No    If Yes, Characterize:   _33  Rhonchi     _34  Crackles     _35  Wheezing     _36  Stridor Ascites? _37   Yes  _38   No  Leg Edema?  _39  Yes    _40  No         If yes, grade of edema:  _41  1+  _42  2+  _43  3+  _44  4+  Sacral Edema?  _45  Yes   _46  No        If yes, grade of edema:    _47  1+  _48  2+  _49  3+  _50  4+  Jugular Vein Distention (JVD)?:  _51  Yes  _52   No        If yes, provide measurement:   __8__ cm  NYHA Classification:  _53  I   _54  II  _55  III  _56   IV  _57   N/A  Has subject expressed presence of thirst?  _58  Yes   _59   No       If Yes, rate of thirst level (10 being the  worst): _0  1 _1  2 _2  3 _3  4 _4  5 _5  6 _6  7 _7 8 _8  9 _9  10   ++++  PROVIDER NOTES OR COMMENTS:          Length of Stay: 2  Darrick Grinder, NP  06/21/2021, 7:19 AM  Advanced Heart Failure Team Pager [phone] (M-F; 7a - 5p)  Please contact [facility] for night-coverage after hours (5p -7a ) and weekends on [email or web address]  Patient seen with NP, agree with the above note.   Excellent diuresis yesterday with Reprieve device. Weight down 12 lbs.  Pending BMET.  CVP 9-10.   Patient feeling better.   General: NAD Neck: JVP 10 cm, no thyromegaly or thyroid nodule.  Lungs: Clear to auscultation bilaterally with normal respiratory effort. CV: Nondisplaced PMI.  Heart regular S1/S2, no S3/S4, 2/6 SEM RUSB.  No peripheral edema.   Abdomen: Soft, nontender, no hepatosplenomegaly, no distention.  Skin: Intact without lesions or rashes.  Neurologic: Alert and oriented x 3.  Psych: Normal affect. Extremities: No clubbing or cyanosis.  HEENT: Normal.   1. Acute on chronic systolic CHF: Nonischemic cardiomyopathy.  RHC in 4/16 showed preserved cardiac index by thermodilution but low by Fick.   Last echo in 8/21 showed EF 30-35%, normal RV, no residual VSD, no definite pulmonic stenosis.  He has a [employer], [employer] ICD. He has an IVCD but QRS not long enough for CRT.  NYHA class IV at admission with hypoxemia.  BP has been soft in the hospital, not sure if he is actually compliant with his meds though he says that he is. Echo this admission with E 30-35%, no VSD noted, no significant pulmonary stenosis (peak gradient 6), moderate RV enlargement with normal systolic function.  Excellent diuresis with Reprieve device, CVP 9-10, co-ox 82%. Weight down 12 lbs.  Still pending BMET.  - If creatinine stable, would continue 1 more day then switch back to po tomorrow.  - Coreg, Entresto, Bidil on hold for now with soft BP.   ?Compliance at home though he says he was  taking everything.  Will add back losartan 12.5 mg bid today, if BP remains stable would transition over to [place] again before discharge.  - Continue spironolactone 25 daily.  - Continue digoxin 0.125.    - Continue Farxiga 10 mg daily.   - Probably not Cardiomems candidate due to Medicaid.  - Will likely aim for LHC/RHC prior to discharge (remote coronary angiography) => Monday.  2. H/o VSD: No residual shunt noted on echo this admission.  Last RHC also did not suggest any significant left to right shunting.  3. Pulmonary stenosis: Very mild by 7/16 echo, no significant stenosis 8/21 echo or this admission.  Mild PS murmur on exam.  4. OSA/?OHS: On CPAP at home, markedly hypercarbic at admission.   - Pulmonary has seen.  5. ID: Low grade fever to 100.7 3/9, now afebrile.  CXR with pulmonary edema. PCT <0.1.  GPCs in 1 blood culture, ?contaminant.  - Await speciation of culture.  6. AKI: Creatinine 1.5 at admission, 1.06 yesterday.  Pending BMET today.   [name] 06/21/2021 8:54 AM

## 2021-06-21 NOTE — Progress Notes (Signed)
K 40 po ordered for 4 doses starting 06/21/2021 (ordered by DM) ?Any of the abnormal labs clinically significant Dr Aundra Dubin?  ? ?

## 2021-06-21 NOTE — Research (Addendum)
? ?  12 Hour after treatment Initiation ? ?SITE #   001            SUBJECT #:    07        SUBJECT INITIALS: BNW ?Treatment Assessment: (12 hours after Treatment initiation or Therapy End IF < 12 hours) ? ?FLUID BALANCE:  ? ?Date/Time of Assessment _09__/_MAR___/_2023___   (DD/MMM/YYYY) @ 2100  ?  ? ?Input ?DMS total Infusion _3621_____ mL _0  NA ?Other IV Fluid Input _______ mL _1  NA ?Oral Fluid Input _720_______ ML _2  NA ?Total Input _4341______ mL ? ? ?Output ?Total Urine __12050______ mL _3  NA ?Missed Urine ________ mL _4  NA ?Other Fluid Output _______ mL _5  NA ?Total Output _12050______ mL ? ?Was Blood sample collected and processed for Site LAB?      ?                 _6  YES     _7  NO     _8  Not required per protocol ?Collection Date / Time  :    _09__/_MAR___/_2023_    _21_:_00_     _9   Time Unknown ?           (DD/MMM/YYYY)  (HH:MM) ? ?Date/Time of Assessment Date/Time of Assessment _09__/_MAR___/_2023___   (DD/MMM/YYYY) @ 2100 ? ? ?Please indicate if the following have occurred since the last assessment of this patient: ?(Only applicable from 23F from Init visit up to Discharge visit) ? ?  ?Acute kidney injury, defined as use of renal replacement therapy (RRT). RRT includes hemodialysis, hemofiltration, hemodiafiltration, peritoneal dialysis, and kidney transplant. Isolated ultrafiltration without dialysis will not be considered RRT. *Note- acute kidney injury defined as KDIGO stage 2 or greateris captured in the Serum Chemistry labs CRF  _10   Yes or _11  No ? ?Symptomatic hypotension, defined as sustained hypotension (<57 mmHg systolic) with corresponding symptoms that require intervention(i.e., fluid bolus or vasoactive medication) _12   Yes or _13  No ?  ?  ?Hypertensive emergency, defined as blood pressure greater than 180/120 mmHg with end-organ damage (cardiovascular, cerebrovascularor renal)  _14   Yes or _15  No ? ? ? ?Form is based on Page 13 of FASTR Trial  eCRFs    Protocol [id number]  ? ? ? ? ? ? ?  ?

## 2021-06-21 NOTE — Progress Notes (Signed)
PHARMACY - PHYSICIAN COMMUNICATION ?CRITICAL VALUE ALERT - BLOOD CULTURE IDENTIFICATION (BCID) ? ?CHAISE MAHABIR is an 46 y.o. male who presented to [facility] on 06/19/2021 with a chief complaint of acute on chronic heart failure. ? ?Assessment:  1/4 GPC staph epi - likely contaminant ? ?Name of physician (or Provider) Contacted: [name] ? ?Current antibiotics: none ? ?Changes to prescribed antibiotics recommended:  ?No changes  ? ?Results for orders placed or performed during the hospital encounter of 06/19/21  ?Blood Culture ID Panel (Reflexed) (Collected: 06/20/2021  9:23 AM)  ?Result Value Ref Range  ? Enterococcus faecalis NOT DETECTED NOT DETECTED  ? Enterococcus Faecium NOT DETECTED NOT DETECTED  ? Listeria monocytogenes NOT DETECTED NOT DETECTED  ? Staphylococcus species DETECTED (A) NOT DETECTED  ? Staphylococcus aureus (BCID) NOT DETECTED NOT DETECTED  ? Staphylococcus epidermidis DETECTED (A) NOT DETECTED  ? Staphylococcus lugdunensis NOT DETECTED NOT DETECTED  ? Streptococcus species NOT DETECTED NOT DETECTED  ? Streptococcus agalactiae NOT DETECTED NOT DETECTED  ? Streptococcus pneumoniae NOT DETECTED NOT DETECTED  ? Streptococcus pyogenes NOT DETECTED NOT DETECTED  ? A.calcoaceticus-baumannii NOT DETECTED NOT DETECTED  ? Bacteroides fragilis NOT DETECTED NOT DETECTED  ? Enterobacterales NOT DETECTED NOT DETECTED  ? Enterobacter cloacae complex NOT DETECTED NOT DETECTED  ? Escherichia coli NOT DETECTED NOT DETECTED  ? Klebsiella aerogenes NOT DETECTED NOT DETECTED  ? Klebsiella oxytoca NOT DETECTED NOT DETECTED  ? Klebsiella pneumoniae NOT DETECTED NOT DETECTED  ? Proteus species NOT DETECTED NOT DETECTED  ? Salmonella species NOT DETECTED NOT DETECTED  ? Serratia marcescens NOT DETECTED NOT DETECTED  ? Haemophilus influenzae NOT DETECTED NOT DETECTED  ? Neisseria meningitidis NOT DETECTED NOT DETECTED  ? Pseudomonas aeruginosa NOT DETECTED NOT DETECTED  ? Stenotrophomonas maltophilia NOT DETECTED NOT  DETECTED  ? Candida albicans NOT DETECTED NOT DETECTED  ? Candida auris NOT DETECTED NOT DETECTED  ? Candida glabrata NOT DETECTED NOT DETECTED  ? Candida krusei NOT DETECTED NOT DETECTED  ? Candida parapsilosis NOT DETECTED NOT DETECTED  ? Candida tropicalis NOT DETECTED NOT DETECTED  ? Cryptococcus neoformans/gattii NOT DETECTED NOT DETECTED  ? Methicillin resistance mecA/C NOT DETECTED NOT DETECTED  ? ? ?Laurey Arrow, PharmD ?PGY1 Pharmacy Resident ?06/21/2021  8:59 AM ? ?Please check [email or web address] for unit-specific pharmacy phone numbers. ? ? ?

## 2021-06-21 NOTE — Research (Signed)
36 Hours Post Treatment Initiation ?SITE #   001            SUBJECT #:    07        SUBJECT INITIALS: BNW ? ?Treatment Assessment: (36 hours after Treatment initiation or Therapy End IF < 36 hours) ? ?Date / time of Assessment: Date / Time:  _10__ / _MAR___/ _2023___   at _20__:__50__       ? ? ?Diuretic Administration:  _0  Continuous    _1   Bolus      _2   Both ?  (Update Diuretic Log)       ? ?Total infused (diuretic): __814____ mg ? ?Total Urine: _20240_______ ml ? ?Urine Production Rate: __531____ ml/hr ? ?Total saline infused: _8361_____ ml ? ?Pump infusion rate: _2.7____ ml/hr ? ?FLUID BALANCE:  ? ?Date/Time of Assessment 10___/_MAR___/_2023___   (DD/MMM/YYYY)  _3  ? ?   ? ?Input ?DMS total Infusion __8361____ mL _4  NA ?Other IV Fluid Input _______ mL _5  NA ?Oral Fluid Input __1800______ ML _6  NA ?Total Input _10161______ mL ? ? ?Output ?Total Urine __23625______ mL _7  NA ?Missed Urine ________ mL _8  NA ?Other Fluid Output _______ mL _9  NA ?Total Output __23625_____ mL ? ?Was Blood sample collected and processed for Site LAB?      ?                 _10  YES     _11  NO     _12  Not required per protocol ?Collection Date / Time  :    _10__/_MAR___/_2023___     _20__:_50___     _13   Time Unknown ?           (DD/MMM/YYYY)  (HH:MM) ? ?Date/Time of Assessment Date/Time of Assessment _10__/_MAR___/_2023___   (DD/MMM/YYYY)  ? ? ?Please indicate if the following have occurred since the last assessment of this patient: ?(Only applicable from 82S from Init visit up to Discharge visit) ? ?  ?Acute kidney injury, defined as use of renal replacement therapy (RRT). RRT includes hemodialysis, hemofiltration, hemodiafiltration, peritoneal dialysis, and kidney transplant. Isolated ultrafiltration without dialysis will not be considered RRT. *Note- acute kidney injury defined as KDIGO stage 2 or greateris captured in the Serum Chemistry labs CRF  _14   Yes or _15  No ? ?Symptomatic hypotension, defined as sustained hypotension (<60  mmHg systolic) with corresponding symptoms that require intervention(i.e., fluid bolus or vasoactive medication) _16   Yes or _17  No ?  ?  ?Hypertensive emergency, defined as blood pressure greater than 180/120 mmHg with end-organ damage (cardiovascular, cerebrovascularor renal)  _18   Yes or _19  No ? ? ?Form is based on Page   ? ? ? ? ?

## 2021-06-21 NOTE — Research (Signed)
FASTR 24 Hour labs  ? ?Dr Aundra Dubin, Do you feel the any of the labs are clinically significant? If so any additional orders?   ? ? ? ?

## 2021-06-22 LAB — COMPREHENSIVE METABOLIC PANEL
ALT: 19 U/L (ref 0–44)
ALT: 22 U/L (ref 0–44)
ALT: 19 U/L (ref 0–44)
AST: 18 U/L (ref 15–41)
AST: 20 U/L (ref 15–41)
AST: 23 U/L (ref 15–41)
Albumin: 4 g/dL (ref 3.5–5.0)
Albumin: 4.1 g/dL (ref 3.5–5.0)
Albumin: 3.5 g/dL (ref 3.5–5.0)
Alkaline Phosphatase: 53 U/L (ref 38–126)
Alkaline Phosphatase: 56 U/L (ref 38–126)
Alkaline Phosphatase: 48 U/L (ref 38–126)
Anion gap: 13 (ref 5–15)
Anion gap: 12 (ref 5–15)
Anion gap: 12 (ref 5–15)
BUN: 13 mg/dL (ref 6–20)
BUN: 17 mg/dL (ref 6–20)
BUN: 21 mg/dL — ABNORMAL HIGH (ref 6–20)
CO2: 38 mmol/L — ABNORMAL HIGH (ref 22–32)
CO2: 39 mmol/L — ABNORMAL HIGH (ref 22–32)
CO2: 36 mmol/L — ABNORMAL HIGH (ref 22–32)
Calcium: 8 mg/dL — ABNORMAL LOW (ref 8.9–10.3)
Calcium: 9.1 mg/dL (ref 8.9–10.3)
Calcium: 8.9 mg/dL (ref 8.9–10.3)
Chloride: 87 mmol/L — ABNORMAL LOW (ref 98–111)
Chloride: 85 mmol/L — ABNORMAL LOW (ref 98–111)
Chloride: 83 mmol/L — ABNORMAL LOW (ref 98–111)
Creatinine, Ser: 1.11 mg/dL (ref 0.61–1.24)
Creatinine, Ser: 1.1 mg/dL (ref 0.61–1.24)
Creatinine, Ser: 1.05 mg/dL (ref 0.61–1.24)
GFR, Estimated: >60 mL/min (ref >60)
GFR, Estimated: >60 mL/min (ref >60)
GFR, Estimated: >60 mL/min (ref >60)
Glucose, Bld: 153 mg/dL — ABNORMAL HIGH (ref 70–99)
Glucose, Bld: 173 mg/dL — ABNORMAL HIGH (ref 70–99)
Glucose, Bld: 109 mg/dL — ABNORMAL HIGH (ref 70–99)
Potassium: 3.6 mmol/L (ref 3.5–5.1)
Potassium: 3.9 mmol/L (ref 3.5–5.1)
Potassium: 4.2 mmol/L (ref 3.5–5.1)
Sodium: 138 mmol/L (ref 135–145)
Sodium: 136 mmol/L (ref 135–145)
Sodium: 131 mmol/L — ABNORMAL LOW (ref 135–145)
Total Bilirubin: 0.9 mg/dL (ref 0.3–1.2)
Total Bilirubin: 0.9 mg/dL (ref 0.3–1.2)
Total Bilirubin: 1.2 mg/dL (ref 0.3–1.2)
Total Protein: 7.7 g/dL (ref 6.5–8.1)
Total Protein: 8.2 g/dL — ABNORMAL HIGH (ref 6.5–8.1)
Total Protein: 6.9 g/dL (ref 6.5–8.1)

## 2021-06-22 LAB — COOXEMETRY PANEL
Carboxyhemoglobin: 1.7 % — ABNORMAL HIGH (ref 0.5–1.5)
Methemoglobin: <0.7 % (ref 0.0–1.5)
O2 Saturation: 71.1 %
Total hemoglobin: 16.2 g/dL — ABNORMAL HIGH (ref 12.0–16.0)

## 2021-06-22 LAB — GLUCOSE, CAPILLARY
Glucose-Capillary: 150 mg/dL — ABNORMAL HIGH (ref 70–99)
Glucose-Capillary: 120 mg/dL — ABNORMAL HIGH (ref 70–99)
Glucose-Capillary: 96 mg/dL (ref 70–99)
Glucose-Capillary: 129 mg/dL — ABNORMAL HIGH (ref 70–99)

## 2021-06-22 LAB — CBC
HCT: 50.4 % (ref 39.0–52.0)
Hemoglobin: 16 g/dL (ref 13.0–17.0)
MCH: 31.6 pg (ref 26.0–34.0)
MCHC: 31.7 g/dL (ref 30.0–36.0)
MCV: 99.6 fL (ref 80.0–100.0)
Platelets: 167 10*3/uL (ref 150–400)
RBC: 5.06 MIL/uL (ref 4.22–5.81)
RDW: 12.8 % (ref 11.5–15.5)
WBC: 4.9 10*3/uL (ref 4.0–10.5)
nRBC: 0 % (ref 0.0–0.2)

## 2021-06-22 LAB — PROCALCITONIN: Procalcitonin: <0.1 ng/mL

## 2021-06-22 LAB — MAGNESIUM
Magnesium: 2.1 mg/dL (ref 1.7–2.4)
Magnesium: 2.3 mg/dL (ref 1.7–2.4)
Magnesium: 2.5 mg/dL — ABNORMAL HIGH (ref 1.7–2.4)

## 2021-06-22 MED ORDER — POTASSIUM CHLORIDE CRYS ER 20 MEQ PO TBCR
20.0000 meq | EXTENDED_RELEASE_TABLET | Freq: Once | ORAL | Status: AC
  Administered 2021-06-22: 20 meq via ORAL
  Filled 2021-06-22: qty 1

## 2021-06-22 NOTE — Plan of Care (Signed)
?  Problem: Education: ?Goal: Knowledge of General Education information will improve ?Description: Including pain rating scale, medication(s)/side effects and non-pharmacologic comfort measures ?Outcome: Progressing ?  ?Problem: Health Behavior/Discharge Planning: ?Goal: Ability to manage health-related needs will improve ?Outcome: Progressing ?  ?Problem: Clinical Measurements: ?Goal: Ability to maintain clinical measurements within normal limits will improve ?Outcome: Progressing ?Goal: Will remain free from infection ?Outcome: Progressing ?Goal: Diagnostic test results will improve ?Outcome: Progressing ?Goal: Respiratory complications will improve ?Outcome: Progressing ?Goal: Cardiovascular complication will be avoided ?Outcome: Progressing ?  ?Problem: Activity: ?Goal: Risk for activity intolerance will decrease ?Outcome: Progressing ?  ?Problem: Nutrition: ?Goal: Adequate nutrition will be maintained ?Outcome: Progressing ?  ?Problem: Coping: ?Goal: Level of anxiety will decrease ?Outcome: Progressing ?  ?Problem: Elimination: ?Goal: Will not experience complications related to bowel motility ?Outcome: Progressing ?Goal: Will not experience complications related to urinary retention ?Outcome: Progressing ?  ?Problem: Pain Managment: ?Goal: General experience of comfort will improve ?Outcome: Progressing ?  ?Problem: Safety: ?Goal: Ability to remain free from injury will improve ?Outcome: Progressing ?  ?Problem: Skin Integrity: ?Goal: Risk for impaired skin integrity will decrease ?Outcome: Progressing ?  ?Problem: Education: ?Goal: Ability to demonstrate management of disease process will improve ?Outcome: Progressing ?Goal: Ability to verbalize understanding of medication therapies will improve ?Outcome: Progressing ?Goal: Individualized Educational Video(s) ?Outcome: Progressing ?  ?Problem: Activity: ?Goal: Capacity to carry out activities will improve ?Outcome: Progressing ?  ?Problem: Cardiac: ?Goal:  Ability to achieve and maintain adequate cardiopulmonary perfusion will improve ?Outcome: Progressing ?  ?

## 2021-06-22 NOTE — Research (Incomplete)
Within 24 Hours of End of Treatment  SITE #   001            SUBJECT #:     07       SUBJECT INITIALS: BNW  FLUID BALANCE:   Date/Time of Assessment _11__/_MAR___/_2023___   (DD/MMM/YYYY)  @ 0720     Input DMS total Infusion _9715_____ mL _0  NA Other IV Fluid Input _______ mL _1  NA Oral Fluid Input _1800_______ ML _2  NA Total Input _11515______ mL   Output Total Urine __28100______ mL _3  NA Missed Urine ________ mL _4  NA Other Fluid Output _______ mL _5  NA Total Output __28100_____ mL   Was Blood sample collected and processed for Site LAB?                       _6  YES     _7  NO     _8  Not required per protocol Collection Date / Time  :    _11__/_MAR___/_2023___     _07__:_56___     _9   Time Unknown            (DD/MMM/YYYY)  (HH:MM)  WEIGHT ASSESSMENT   Exam Date / Time:    _11__/_MAR___/_2023___   (DD/MMM/YYYY)            _09____: _18_____ (HH:MM)     or    _10   Time Unknown  Was standing weight Measured? _11  Yes  _12   No             If Yes, Weight:  _0918          lbs          If No, provide rationale: ____________  Was the assessment completed using the Sponsored provided weight scale?  _13  Yes  _14   No         If NO, what scale was used for assessment? ________   TREATMENT FINAL SYNOPSIS / FOLLOW UP TOTALS  Date/ Time of Hospital admission:   Date /  Time:  ___ / ____/ ____   at ___:____      _15   Time unknown               DD/MMM/YYYY        HH;MM  Physician(s) initiating Procedure:  _16  Dr. Glori Bickers      _17   Dr. [name]      Other Provider: ___________________     Estimated excess fluid volume: _______ L  Therapy Initiation:                          Date /  Time:  ___ / ____/ ____   at ___:____      _18   Time unknown               DD/MMM/YYYY        HH;MM  Therapy Completion / Termination: Date /  Time:  ___ / ____/ ____   at ___:____      _19   Time unknown               DD/MMM/YYYY        HH;MM  Diuretic Administration:  _20  Continuous      _21   Bolus   _22   Both  Total Diuretic Infused during Treatment:   Type: ______  Total Dose: _______ mg  Was Thiazide and/or Thiazide-like diuretic given during treatment?   _23  YES  _24   NO  If yes, Type: _____  If yes, Dose: _____ mg  Total Urine Production: ______ ml  Was a Foley used during treatment? _0  YES   _1   NO  Discharge Diuretic: Type: ___         Dose: _______ mg  Weight Assessment:   Baseline; ____  lb   _2  Not Collected  Discharge: ____ lb _3  Not Collected             Follow-up Visit: _____ lb    _4  Not Collected     Date of Assessment: __  Any SAEs Reported?   _5  YES  _6  NO   (if yes, fill out an SAE form)         Form is based on Page 13 of FASTR Trial  eCRFs    Protocol [id number]     SITE #  001         SUBJECT #            SUBJECT Initials:  Date/ Time of hospital admission:   Date /  Time:  ___ / ____/ ____   at ___:____      _7   Time unknown  Physician(s) initiating procedure:   ____________  [employer] DMS Unit # :    Console Serial Number: ______________      Single Use Set Lot Number: _____________  IV Placed:     Date /  Time:  ___ / ____/ ____   at ___:____      _8   Time unknown  Foley Placed:  Date /  Time:  ___ / ____/ ____   at ___:____      _9   Time unknown  [employer] System Initiation:  Date /  Time:  ___ / ____/ ____   at ___:____      _10   Time unknown  Rep[employer] System Completion/ Termination:  Date /  Time:  ___ / ____/ ____   at ___:____      _11   Time unknown  Foley Removal:     Date /  Time:  ___ / ____/ ____   at ___:____      _12   Time unknown  IV Removed:  Date /  Time:  ___ / ____/ ____   at ___:____      _13   Time unknown   Reason for System Termination:  _14  Completed treatment course as planned  _15  Physician triggered   _16  System triggered      _17  Therapy Interrupted --  Reason interrupted:       <WUJWJXBJYNWGNFAO>_1<\/HYQMVHQIONGEXBMW>_41  Systolic pressure < 90 mmHg    _19  Mean BP < 60 mmHg    _20  HR > 130 beats / min    _21  Abnormal creatinine, electrolytes    _22   Device Issue: Specify __________________    _23  Patient Request      _24  Stop Date / Time: ___ / ____/ ____   at ___:____      _25   Time unknown    _26  Re-Start  Date /  Time:  ___ / ____/ ____   at ___:____      _27   Time unknown   _28  Therapy stopped earlier than planned - Reason Stopped:     _29  Excessive Dyspnea   _30  Hypoxia as evidenced by a SPO2 < 88   _31  Sustained systolic BP < 90 or > 324 mmHg with or without pharmacologic support   _32  Modified clinical / renal status resulting in the need  for hemofiltration or CRRT   _0  Clinically significant arrhythmia   <[id number]>_1<\/RRNHAFBXUXYBFXOV>_2  Systolic pressure < 90 mmHg   _2  Mean BP < 60 mmHg   _3  HR > 130 beats / min   _4  Abnormal creatinine, electrolytes   _5  Patient request   _6  Device Issue; Specify _________________   _7  Due to AE enter AE# __________________   _8  Other: ______________________________      _9  Stop Date / Time: ___ / ____/ ____   at ___:____      _10   Time unknown  Total Time on the [employer] Cardiovascular system:    _____     minutes  Any Device Observation completed (enter number): #_________      (Required to be reported within 24 hours)   Form is based on Pages 22-24 of [facility] Trial  eCRFs    Protocol [id number]

## 2021-06-22 NOTE — Research (Signed)
48 Hours Post Treatment Initiation ?SITE #   001            SUBJECT #:     07       SUBJECT INITIALS: BNW ? ?Treatment Assessment: (48 hours after Treatment initiation or Therapy End IF < 48 hours) ? ?Date / time of Assessment: Date / Time:  _11__ / _MAR___/ _2023___   at _0720__:____       ? ? ?Diuretic Administration:  _0  Continuous    _1   Bolus      _2   Both ?  (Update Diuretic Log)       ? ?Total infused (diuretic): _1196_____ mg ? ?Total Urine: _23833_______ ml ? ?Urine Production Rate: _105_____ ml/hr ? ?Total saline infused: _9715_____ ml ? ?Pump infusion rate: __4___ ml/hr ? ?FLUID BALANCE:  ? ?Date/Time of Assessment _11__/_MAR___/_2023___   (DD/MMM/YYYY)   ? ? ?Input ?DMS total Infusion _9715_____ mL _3  NA ?Other IV Fluid Input _______ mL _4  NA ?Oral Fluid Input _1800_______ ML _5  NA ?Total Input _11515______ mL ? ? ?Output ?Total Urine _28100_______ mL _6  NA ?Missed Urine ________ mL _7  NA ?Other Fluid Output _______ mL _8  NA ?Total Output __28100_____ mL ? ? ?WEIGHT ASSESSMENT ? ? ?Exam Date / Time:    __11_/_MAR___/_2023___   (DD/MMM/YYYY)   ? ?               _0918____: ______ (HH:MM)     or    _9   Time Unknown ? ?Was standing weight Measured? _10  Yes  _11   No  ?   ?        If Yes, Weight:  _231.4_          lbs ? ?        If No, provide rationale: ____________ ? ?Was the assessment completed using the Sponsored provided weight scale?  _12  Yes  _13   No ? ?       If NO, what scale was used for assessment? ________ ? ? ?Was Blood sample collected and processed for Site LAB?      ?                 _14  YES     _15  NO     _16  Not required per protocol ?Collection Date / Time  :    _11__/_MAR___/_2023___     _07__:_56___     _17   Time Unknown ?           (DD/MMM/YYYY)  (HH:MM) ?Was URINE sample collected and processed for Site LAB?      ?                 _18  YES     _19  NO     _20  Not required per protocol ?Collection Date / Time  :    _11__/_MAR___/_2023___     _07__:_20___     _21   Time Unknown ?            (DD/MMM/YYYY)  (HH:MM) ? ? ?Date/Time of Assessment Date/Time of Assessment _11__/_MAR___/_2023___   (DD/MMM/YYYY)  ? ? ?Please indicate if the following have occurred since the last assessment of this patient: ?(Only applicable from 02V from Init visit up to Discharge visit) ? ?  ?Acute kidney injury, defined as use of renal replacement therapy (RRT). RRT includes hemodialysis, hemofiltration, hemodiafiltration, peritoneal dialysis, and kidney transplant. Isolated ultrafiltration without dialysis will not be considered RRT. *Note- acute kidney injury defined as KDIGO stage 2 or greateris  captured in the Serum Chemistry labs CRF  _0   Yes or _1  No ? ?Symptomatic hypotension, defined as sustained hypotension (<18 mmHg systolic) with corresponding symptoms that require intervention(i.e., fluid bolus or vasoactive medication) _2   Yes or _3  No ?  ?  ?Hypertensive emergency, defined as blood pressure greater than 180/120 mmHg with end-organ damage (cardiovascular, cerebrovascularor renal)  _4   Yes or _5  No ? ? ?Form is based on Page 13 of FASTR Trial  eCRFs    Protocol [id number]  ? ? ? ? ? ? ?

## 2021-06-22 NOTE — Progress Notes (Signed)
Advanced Heart Failure Rounding Note  PCP-Cardiologist: [name], MD   Subjective:    Admit weight 255.4--->235.6 pounds -> 231.4   FASTR Trial stopped at 7:20am due to persistent low urine output  Feels great. Denies any SOB, orthopnea or PND. Foley is out.  Scr stable.   CVP 5 (measured personally)  Objective:   Weight Range: 105 kg Body mass index is 37.35 kg/m.   Vital Signs:   Temp:  [98 F (36.7 C)-98.8 F (37.1 C)] 98.3 F (36.8 C) (03/11 0912) Pulse Rate:  [84-110] 94 (03/11 0912) Resp:  [17-20] 18 (03/11 0912) BP: (86-122)/(60-85) 103/68 (03/11 0912) SpO2:  [90 %-95 %] 94 % (03/11 0912) FiO2 (%):  [40 %] 40 % (03/11 0239) Weight:  [104.6 kg-105 kg] 105 kg (03/11 0918) Last BM Date : 06/20/21  Weight change: Filed Weights   06/21/21 0433 06/22/21 0527 06/22/21 0918  Weight: 106.9 kg 104.6 kg 105 kg    Intake/Output:   Intake/Output Summary (Last 24 hours) at 06/22/2021 1059 Last data filed at 06/22/2021 0751 Gross per 24 hour  Intake 6451 ml  Output 9400 ml  Net -2949 ml       Physical Exam    General:  Lying in bed . No resp difficulty HEENT: normal Neck: supple. no JVD. Carotids 2+ bilat; no bruits. No lymphadenopathy or thryomegaly appreciated. Cor: PMI nondisplaced. Regular rate & rhythm. No rubs, gallops or murmurs. Lungs: clear Abdomen: obese soft, nontender, nondistended. No hepatosplenomegaly. No bruits or masses. Good bowel sounds. Extremities: no cyanosis, clubbing, rash, tr edema + unna boots Neuro: alert & orientedx3, cranial nerves grossly intact. moves all 4 extremities w/o difficulty. Affect pleasant   Telemetry   SR 90-100 Personally reviewed   Labs    CBC Recent Labs    06/21/21 0150 06/22/21 0756  WBC 5.0 4.9  HGB 13.6 16.0  HCT 42.4 50.4  MCV 103.2* 99.6  PLT 128* 446    Basic Metabolic Panel Recent Labs    06/22/21 0500 06/22/21 0756  NA 138 136  K 3.6 3.9  CL 87* 85*  CO2 38* 39*  GLUCOSE  153* 173*  BUN 13 17  CREATININE 1.11 1.10  CALCIUM 8.0* 9.1  MG 2.1 2.3    Liver Function Tests Recent Labs    06/22/21 0500 06/22/21 0756  AST 18 20  ALT 19 22  ALKPHOS 53 56  BILITOT 0.9 0.9  PROT 7.7 8.2*  ALBUMIN 4.0 4.1    No results for input(s): LIPASE, AMYLASE in the last 72 hours.  Cardiac Enzymes No results for input(s): CKTOTAL, CKMB, CKMBINDEX, TROPONINI in the last 72 hours.  BNP: BNP (last 3 results) Recent Labs    06/19/21 0957  BNP 235.5*     ProBNP (last 3 results) No results for input(s): PROBNP in the last 8760 hours.   D-Dimer Recent Labs    06/19/21 1100  DDIMER 0.32    Hemoglobin A1C Recent Labs    06/20/21 0355  HGBA1C 6.1*    Fasting Lipid Panel No results for input(s): CHOL, HDL, LDLCALC, TRIG, CHOLHDL, LDLDIRECT in the last 72 hours. Thyroid Function Tests No results for input(s): TSH, T4TOTAL, T3FREE, THYROIDAB in the last 72 hours.  Invalid input(s): FREET3   Other results:   Imaging    No results found.   Medications:     Scheduled Medications:  aspirin  81 mg Oral Daily   chlorhexidine  15 mL Mouth Rinse BID   Chlorhexidine  Gluconate Cloth  6 each Topical Daily   Chlorhexidine Gluconate Cloth  6 each Topical Daily   dapagliflozin propanediol  10 mg Oral Daily   digoxin  125 mcg Oral Daily   enoxaparin (LOVENOX) injection  40 mg Subcutaneous QHS   losartan  12.5 mg Oral BID   mouth rinse  15 mL Mouth Rinse q12n4p   mupirocin ointment  1 application. Nasal BID   simvastatin  20 mg Oral q1800   sodium chloride flush  10-40 mL Intracatheter Q12H   sodium chloride flush  3 mL Intravenous Q12H   spironolactone  25 mg Oral Daily    Infusions:  sodium chloride 250 mL (06/20/21 0933)    PRN Medications: sodium chloride, acetaminophen, sodium chloride flush    Patient Profile   46 y.o. male who has a history of VSD repair and removal of a pulmonary artery band at age 51, DM, HTN, systolic heart  failure, NICM and OSA w/ poor compliance w/ CPAP, admitted for acute hypoxic respiratory failure in setting of acute on chronic systolic heart failure and AKI.   Assessment/Plan   1. Acute Hypoxic/Hyercarbic Respiratory Failure - O2 sats reportedly in the 30s on arrival and cyanotic, improved on NRB - initially on HFNC, 6L/min and BiPAP at night - CO2 elevated. Suspect poor compliance w/ CPAP.  - COVID/Flu negative, D-dimer negative, PE unlikely  - CXR w/ pulmonary vascular congestion/ pulmonary edema  - Much improved with diuresis    2. Acute on Chronic Systolic Heart Failure - Nonischemic cardiomyopathy.  RHC in 4/16 showed preserved cardiac index by thermodilution but low by Fick.   - Last echo in 8/21 showed EF 30-35%, normal RV, no residual VSD, no definite pulmonic stenosis.   - Now w/ NYHA Class III-IIIb symptoms. Volume assessment difficult given body habitus, but appears fluid overloaded. BNP only mildly elevated at 235.5, but suspect falsely low in setting of obesity. CXR shows pulmonary edema. Med compliance has been questionable. -  FASTR Trial stopped at 7:20am due to persistent low urine output - Echo EF 30-35% RV moderately reduced.  - CVP 5. Hold diuretics today. Switch back to po in am.  - Entresto on hold d/t AKI and hyperkalemia. BP too low to restart today. Will re-evaluate tomorrow  - Continue spiro 12.5 mg daily - hold Coreg with ADHF - continue Farxiga 10 mg  - hold Bidil w/ soft BP  - continue digoxin 0.125 (dig level ok, 0.2) - Continue UNNA boots - Has [employer] ICD. Not candidate for CRT-D upgrade w/ narrow QRS 125 ms  - R/L cath Monday with [name]. Orders in.    3. AKI  - Baseline SCr 1.1 - 1.5 on admit, in setting of HF. ? Low output/cardiorenal. - Scr  1.1 today    4. Hyperkalemia - K 5.4 on admit, in setting of AKI  - Continue spiro 12.5 mg daily - Hold Entresto -  k 3.9 today   5. OSA - needs to improve compliance w/ CPAP  - BiPAP QHS while  here   7. Thrombocytopenia - plts 128 -> 167 - appears chronic, prior baseline ~140K  - monitor    8. Congenital Heart Disease - VSD repair and removal of a pulmonary artery band at age 39 - echo 8/21 showed no significant residual VSD, normal RV - echo 06/20/21 EF 30-35% no PI. Mild PS   9. Type 2DM  - SSI - continue Farxiga 10 mg  - A1c 6.4% in  January. Recheck.   10. Poor Med Compliance - would benefit from paramedicine at d/c - will engage HF SW team   11. Fever - Afebrile - Procalcitonin < 0.1 - Resp panel negative for COVID and influenza A/B - Bld CX  Gram + Cocci - final results pending.   Check BMET now.     SITE # 001    SUBJECT#  _7___      SUBJECT INITIALS:  Kevin Cardenas  INTERVAL: _0  Screening / Baseline _1   24 Hrs - During Tx       _2  48 Hrs - During Tx  _3   72 Hrs - End of Tx   _4  Post Tx - Within 24 Hrs _5  Post Tx - 72 hours after Tx Initiation    _6  Post Tx - 7 Days after Tx  Initiation    _7  Discharge   _8  7-Day Follow-Up   _9  30 -Day Follow-UP  Exam Performed:  _10  Yes  _11  No Exam Date; __3/11/2023__________  (DD/MMM/YYYY)                     ___11_:04______ (HH;MM)    _12   Time Unknown   Temperature: _98.8  F  Height: (Baseline only)   ____ _13  Inches  _14   cm _15  Not Done   _16   NA  Estimated pounds of fluid overload:  24 lbs  Is Subject experiencing Shortness of Breath?  _17  Yes   _18   No  Dyspnea VAS: (10 being the worst)   _19  1 _20  2 _21  3 _22  4 _23  5 _24  6 _25  7 _26 8 _27  9 _28  10  Is Subject experiencing Orthopnea?  _29  Yes    _30  No  Is there evidence of Rales?  _31  Yes  _32   No    If Yes, Characterize:   _33  Rhonchi     _34  Crackles     _35  Wheezing     _36  Stridor Ascites? _37   Yes  _38   No  Leg Edema?  _39  Yes    _40  No         If yes, grade of edema:  _41  1+  _42  2+  _43  3+  _44  4+  Sacral Edema?  _45  Yes   _46  No        If yes, grade of edema:    _47  1+  _48  2+  _49  3+  _50  4+  Jugular Vein Distention (JVD)?:  _51  Yes  _52   No        If yes, provide  measurement:   __5__ cm  NYHA Classification:  _53  I   _54  II  _55  III  _56   IV  _57   N/A  Has subject expressed presence of thirst?  _58  Yes  _59   No       If Yes, rate of thirst level (10 being the worst): _60  1 _61  2 _62  3 _63  4 _64  5 _65  6 _66  7 _67 8 _68  9 _69  10   ++++  PROVIDER NOTES OR COMMENTS:          Length of Stay: 3  Glori Bickers, MD  06/22/2021, 10:59 AM  Advanced Heart Failure Team Pager [phone] (M-F; 7a - 5p)  Please contact [facility] for night-coverage after hours (5p -7a ) and weekends on [email or web address]

## 2021-06-22 NOTE — Progress Notes (Signed)
Morning labs for FASTR

## 2021-06-22 NOTE — Progress Notes (Signed)
FASTR labs

## 2021-06-23 LAB — COMPREHENSIVE METABOLIC PANEL
ALT: 22 U/L (ref 0–44)
AST: 23 U/L (ref 15–41)
Albumin: 3.7 g/dL (ref 3.5–5.0)
Alkaline Phosphatase: 57 U/L (ref 38–126)
Anion gap: 10 (ref 5–15)
BUN: 18 mg/dL (ref 6–20)
CO2: 37 mmol/L — ABNORMAL HIGH (ref 22–32)
Calcium: 9 mg/dL (ref 8.9–10.3)
Chloride: 83 mmol/L — ABNORMAL LOW (ref 98–111)
Creatinine, Ser: 0.95 mg/dL (ref 0.61–1.24)
GFR, Estimated: >60 mL/min (ref >60)
Glucose, Bld: 120 mg/dL — ABNORMAL HIGH (ref 70–99)
Potassium: 3.8 mmol/L (ref 3.5–5.1)
Sodium: 130 mmol/L — ABNORMAL LOW (ref 135–145)
Total Bilirubin: 0.9 mg/dL (ref 0.3–1.2)
Total Protein: 7.3 g/dL (ref 6.5–8.1)

## 2021-06-23 LAB — GLUCOSE, CAPILLARY
Glucose-Capillary: 145 mg/dL — ABNORMAL HIGH (ref 70–99)
Glucose-Capillary: 112 mg/dL — ABNORMAL HIGH (ref 70–99)
Glucose-Capillary: 120 mg/dL — ABNORMAL HIGH (ref 70–99)
Glucose-Capillary: 129 mg/dL — ABNORMAL HIGH (ref 70–99)

## 2021-06-23 LAB — CBC
HCT: 45.6 % (ref 39.0–52.0)
Hemoglobin: 14.9 g/dL (ref 13.0–17.0)
MCH: 32.7 pg (ref 26.0–34.0)
MCHC: 32.7 g/dL (ref 30.0–36.0)
MCV: 100.2 fL — ABNORMAL HIGH (ref 80.0–100.0)
Platelets: 141 10*3/uL — ABNORMAL LOW (ref 150–400)
RBC: 4.55 MIL/uL (ref 4.22–5.81)
RDW: 12.7 % (ref 11.5–15.5)
WBC: 4.6 10*3/uL (ref 4.0–10.5)
nRBC: 0 % (ref 0.0–0.2)

## 2021-06-23 LAB — COOXEMETRY PANEL
Carboxyhemoglobin: 1.6 % — ABNORMAL HIGH (ref 0.5–1.5)
Methemoglobin: <0.7 % (ref 0.0–1.5)
O2 Saturation: 76.2 %
Total hemoglobin: 13.5 g/dL (ref 12.0–16.0)

## 2021-06-23 LAB — CULTURE, BLOOD (ROUTINE X 2): Special Requests: ADEQUATE

## 2021-06-23 LAB — MAGNESIUM: Magnesium: 2.6 mg/dL — ABNORMAL HIGH (ref 1.7–2.4)

## 2021-06-23 MED ORDER — LOSARTAN POTASSIUM 25 MG PO TABS: 25.0000 mg | ORAL_TABLET | Freq: Every day | ORAL | Status: DC

## 2021-06-23 MED ORDER — SODIUM CHLORIDE 0.9 % IV SOLN: 250.0000 mL | INTRAVENOUS | Status: DC | PRN

## 2021-06-23 MED ORDER — TORSEMIDE 20 MG PO TABS
20.0000 mg | ORAL_TABLET | Freq: Two times a day (BID) | ORAL | Status: DC
Start: 2021-06-23 — End: 2021-06-24
  Administered 2021-06-24: 20 mg via ORAL
  Filled 2021-06-23 (×2): qty 1

## 2021-06-23 MED ORDER — POTASSIUM CHLORIDE CRYS ER 20 MEQ PO TBCR: 40.0000 meq | EXTENDED_RELEASE_TABLET | Freq: Once | ORAL | Status: DC

## 2021-06-23 MED ORDER — ASPIRIN 81 MG PO CHEW
81.0000 mg | CHEWABLE_TABLET | ORAL | Status: AC
  Administered 2021-06-24: 81 mg via ORAL

## 2021-06-23 MED ORDER — SODIUM CHLORIDE 0.9% FLUSH: 3.0000 mL | INTRAVENOUS | Status: DC | PRN

## 2021-06-23 MED ORDER — SODIUM CHLORIDE 0.9 % IV SOLN
INTRAVENOUS | Status: DC
Start: 2021-06-24 — End: 2021-06-24

## 2021-06-23 NOTE — Plan of Care (Signed)
?  Problem: Education: ?Goal: Knowledge of General Education information will improve ?Description: Including pain rating scale, medication(s)/side effects and non-pharmacologic comfort measures ?Outcome: Progressing ?  ?Problem: Health Behavior/Discharge Planning: ?Goal: Ability to manage health-related needs will improve ?Outcome: Progressing ?  ?Problem: Clinical Measurements: ?Goal: Ability to maintain clinical measurements within normal limits will improve ?Outcome: Progressing ?Goal: Will remain free from infection ?Outcome: Progressing ?Goal: Diagnostic test results will improve ?Outcome: Progressing ?Goal: Respiratory complications will improve ?Outcome: Progressing ?Goal: Cardiovascular complication will be avoided ?Outcome: Progressing ?  ?Problem: Activity: ?Goal: Risk for activity intolerance will decrease ?Outcome: Progressing ?  ?Problem: Nutrition: ?Goal: Adequate nutrition will be maintained ?Outcome: Progressing ?  ?Problem: Coping: ?Goal: Level of anxiety will decrease ?Outcome: Progressing ?  ?Problem: Elimination: ?Goal: Will not experience complications related to bowel motility ?Outcome: Progressing ?Goal: Will not experience complications related to urinary retention ?Outcome: Progressing ?  ?Problem: Pain Managment: ?Goal: General experience of comfort will improve ?Outcome: Progressing ?  ?Problem: Safety: ?Goal: Ability to remain free from injury will improve ?Outcome: Progressing ?  ?Problem: Skin Integrity: ?Goal: Risk for impaired skin integrity will decrease ?Outcome: Progressing ?  ?Problem: Education: ?Goal: Ability to demonstrate management of disease process will improve ?Outcome: Progressing ?Goal: Ability to verbalize understanding of medication therapies will improve ?Outcome: Progressing ?Goal: Individualized Educational Video(s) ?Outcome: Progressing ?  ?Problem: Activity: ?Goal: Capacity to carry out activities will improve ?Outcome: Progressing ?  ?Problem: Cardiac: ?Goal:  Ability to achieve and maintain adequate cardiopulmonary perfusion will improve ?Outcome: Progressing ?  ?

## 2021-06-23 NOTE — Plan of Care (Signed)
?  Problem: Education: ?Goal: Knowledge of General Education information will improve ?Description: Including pain rating scale, medication(s)/side effects and non-pharmacologic comfort measures ?Outcome: Progressing ?  ?Problem: Health Behavior/Discharge Planning: ?Goal: Ability to manage health-related needs will improve ?Outcome: Progressing ?  ?Problem: Clinical Measurements: ?Goal: Ability to maintain clinical measurements within normal limits will improve ?Outcome: Progressing ?Goal: Will remain free from infection ?Outcome: Progressing ?Goal: Diagnostic test results will improve ?Outcome: Progressing ?Goal: Respiratory complications will improve ?Outcome: Progressing ?Goal: Cardiovascular complication will be avoided ?Outcome: Progressing ?  ?Problem: Activity: ?Goal: Risk for activity intolerance will decrease ?Outcome: Progressing ?  ?Problem: Nutrition: ?Goal: Adequate nutrition will be maintained ?Outcome: Progressing ?  ?Problem: Coping: ?Goal: Level of anxiety will decrease ?Outcome: Progressing ?  ?Problem: Elimination: ?Goal: Will not experience complications related to bowel motility ?Outcome: Progressing ?Goal: Will not experience complications related to urinary retention ?Outcome: Progressing ?  ?Problem: Pain Managment: ?Goal: General experience of comfort will improve ?Outcome: Progressing ?  ?Problem: Safety: ?Goal: Ability to remain free from injury will improve ?Outcome: Progressing ?  ?Problem: Skin Integrity: ?Goal: Risk for impaired skin integrity will decrease ?Outcome: Progressing ?  ?Problem: Education: ?Goal: Ability to demonstrate management of disease process will improve ?Outcome: Progressing ?Goal: Ability to verbalize understanding of medication therapies will improve ?Outcome: Progressing ?Goal: Individualized Educational Video(s) ?Outcome: Progressing ?  ?

## 2021-06-23 NOTE — Progress Notes (Addendum)
° ° Advanced Heart Failure Rounding Note ° °PCP-Cardiologist: [name], MD  ° °Subjective:   ° °FASTR Trial stopped 3/11 at 7:20am due to persistent low urine output ° °Resting comfortably. Denies SOB, orthopnea or PND ° °Off diuretics. Weight stable overnight.  ° °Objective:   °Weight Range: °105 kg °Body mass index is 37.36 kg/m².  ° °Vital Signs:   °Temp:  [97.4 °F (36.3 °C)-98.4 °F (36.9 °C)] 97.4 °F (36.3 °C) (03/12 0736) °Pulse Rate:  [91-104] 104 (03/12 0736) °Resp:  [15-20] 18 (03/12 0736) °BP: (77-121)/(50-97) 121/97 (03/12 0736) °SpO2:  [84 %-93 %] 84 % (03/12 0736) °Weight:  [105 kg] 105 kg (03/12 0500) °Last BM Date : 06/20/21 ° °Weight change: °Filed Weights  ° 06/22/21 0527 06/22/21 0918 06/23/21 0500  °Weight: 104.6 kg 105 kg 105 kg  ° ° °Intake/Output:  ° °Intake/Output Summary (Last 24 hours) at 06/23/2021 0914 °Last data filed at 06/23/2021 0600 °Gross per 24 hour  °Intake 480 ml  °Output 1100 ml  °Net -620 ml  ° °  ° ° °Physical Exam  ° °General:  Lying in bed . No resp difficulty °HEENT: normal °Neck: supple. no JVD. Carotids 2+ bilat; no bruits. No lymphadenopathy or thryomegaly appreciated. °Cor: PMI nondisplaced. Regular rate & rhythm. No rubs, gallops or murmurs. °Lungs: clear °Abdomen: obese soft, nontender, nondistended. No hepatosplenomegaly. No bruits or masses. Good bowel sounds. °Extremities: no cyanosis, clubbing, rash, tr edema °Neuro: alert & orientedx3, cranial nerves grossly intact. moves all 4 extremities w/o difficulty. Affect pleasant ° ° °Telemetry  ° °SR 100-105 ~ 3 PVC/min Personally reviewed ° ° °Labs  °  °CBC °Recent Labs  °  06/21/21 °0150 06/22/21 °0756  °WBC 5.0 4.9  °HGB 13.6 16.0  °HCT 42.4 50.4  °MCV 103.2* 99.6  °PLT 128* 167  ° ° °Basic Metabolic Panel °Recent Labs  °  06/22/21 °2130 06/23/21 °0500  °NA 131* 130*  °K 4.2 3.8  °CL 83* 83*  °CO2 36* 37*  °GLUCOSE 109* 120*  °BUN 21* 18  °CREATININE 1.05 0.95  °CALCIUM 8.9 9.0  °MG 2.5* 2.6*  ° ° °Liver Function  Tests °Recent Labs  °  06/22/21 °2130 06/23/21 °0500  °AST 23 23  °ALT 19 22  °ALKPHOS 48 57  °BILITOT 1.2 0.9  °PROT 6.9 7.3  °ALBUMIN 3.5 3.7  ° ° °No results for input(s): LIPASE, AMYLASE in the last 72 hours. ° °Cardiac Enzymes °No results for input(s): CKTOTAL, CKMB, CKMBINDEX, TROPONINI in the last 72 hours. ° °BNP: °BNP (last 3 results) °Recent Labs  °  06/19/21 °0957  °BNP 235.5*  ° ° ° °ProBNP (last 3 results) °No results for input(s): PROBNP in the last 8760 hours. ° ° °D-Dimer °No results for input(s): DDIMER in the last 72 hours. ° °Hemoglobin A1C °No results for input(s): HGBA1C in the last 72 hours. ° °Fasting Lipid Panel °No results for input(s): CHOL, HDL, LDLCALC, TRIG, CHOLHDL, LDLDIRECT in the last 72 hours. °Thyroid Function Tests °No results for input(s): TSH, T4TOTAL, T3FREE, THYROIDAB in the last 72 hours. ° °Invalid input(s): FREET3 ° ° °Other results: ° ° °Imaging  ° ° °No results found. ° ° °Medications:   ° ° °Scheduled Medications: ° aspirin  81 mg Oral Daily  ° chlorhexidine  15 mL Mouth Rinse BID  ° Chlorhexidine Gluconate Cloth  6 each Topical Daily  ° Chlorhexidine Gluconate Cloth  6 each Topical Daily  ° dapagliflozin propanediol  10 mg Oral Daily  °   digoxin  125 mcg Oral Daily  ° enoxaparin (LOVENOX) injection  40 mg Subcutaneous QHS  ° losartan  12.5 mg Oral BID  ° mouth rinse  15 mL Mouth Rinse q12n4p  ° mupirocin ointment  1 application. Nasal BID  ° simvastatin  20 mg Oral q1800  ° sodium chloride flush  10-40 mL Intracatheter Q12H  ° sodium chloride flush  3 mL Intravenous Q12H  ° spironolactone  25 mg Oral Daily  ° ° °Infusions: ° sodium chloride 250 mL (06/20/21 0933)  ° ° °PRN Medications: °sodium chloride, acetaminophen, sodium chloride flush ° ° ° °Patient Profile  ° °45 y.o. male who has a history of VSD repair and removal of a pulmonary artery band at age 1, DM, HTN, systolic heart failure, NICM and OSA w/ poor compliance w/ CPAP, admitted for acute hypoxic respiratory  failure in setting of acute on chronic systolic heart failure and AKI.  ° °Assessment/Plan  ° °1. Acute Hypoxic/Hyercarbic Respiratory Failure °- O2 sats reportedly in the 30s on arrival and cyanotic, improved on NRB °- initially on HFNC, 6L/min and BiPAP at night °- CO2 elevated. Suspect poor compliance w/ CPAP.  °- COVID/Flu negative, D-dimer negative, PE unlikely  °- CXR w/ pulmonary vascular congestion/ pulmonary edema  °- Much improved with diuresis. No change today °  °2. Acute on Chronic Systolic Heart Failure °- Nonischemic cardiomyopathy.  RHC in 4/16 showed preserved cardiac index by thermodilution but low by Fick.   °- Last echo in 8/21 showed EF 30-35%, normal RV, no residual VSD, no definite pulmonic stenosis.   °- Now w/ NYHA Class III-IIIb symptoms. Volume assessment difficult given body habitus, but appears fluid overloaded. BNP only mildly elevated at 235.5, but suspect falsely low in setting of obesity. CXR shows pulmonary edema. Med compliance has been questionable. °-  FASTR Trial stopped at 7:20am due to persistent low urine output °- Echo EF 30-35% RV moderately reduced.  °- CVP 4.  Resume home torsemide at 20 bid °- Entresto on hold d/t AKI and hyperkalemia. SBP remains 90-100. Tolerating losartan 12.5 bid °- Continue spiro 12.5 mg daily °- Continue Farxiga 10 mg  °- Carvedilol and Bidil  on holdw/ soft BP  °- continue digoxin 0.125 (dig level ok, 0.2) °- Continue UNNA boots °- Has St Jude ICD. Not candidate for CRT-D upgrade w/ narrow QRS 125 ms  °- R/L cath tomorrow with Dr. Mclean. Orders in.  °  °3. AKI  °- Baseline SCr 1.1 °- 1.5 on admit, in setting of HF. ? Low output/cardiorenal. °- Scr 0.95  today  °  °4. PVCs °- burden variable. Ranges 0-15 PVCs/min °- consider Zio at d/c to evaluate total burden ° °5. Hyperkalemia °- K 5.4 on admit, in setting of AKI  °- Continue spiro 12.5 mg daily °-  k 3.8 today °  °6. OSA °- needs to improve compliance w/ CPAP  °- BiPAP QHS while here °  °7.  Thrombocytopenia °- plts 128 -> 167 °- appears chronic, prior baseline ~140K  °- monitor  °  °8. Congenital Heart Disease °- VSD repair and removal of a pulmonary artery band at age 1 °- echo 8/21 showed no significant residual VSD, normal RV °- echo 06/20/21 EF 30-35% no PI. Mild PS °  °9. Type 2DM  °- SSI °- continue Farxiga 10 mg  °- A1c 6.4% in January. Recheck. °  °10. Poor Med Compliance °- would benefit from paramedicine at d/c °- will engage HF   SW team  ° °11. Fever °- Afebrile °- Procalcitonin < 0.1 °- Resp panel negative for COVID and influenza A/B °- Bld CX  Gram + Cocci - final results pending.  ° ° ° ° °SITE # 001    SUBJECT#  _7___      SUBJECT INITIALS:  BW___ ° °INTERVAL: [] Screening / Baseline []  24 Hrs - During Tx °      [x] 48 Hrs - During Tx  []  72 Hrs - End of Tx °  [x] Post Tx - Within 24 Hrs [x] Post Tx - 72 hours after Tx Initiation ° °  [] Post Tx - 7 Days after Tx  Initiation  °  [] Discharge °  [] 7-Day Follow-Up °  [] 30 -Day Follow-UP ° °Exam Performed:  [x] Yes  [] No °Exam Date; __3/12/2023__________  (DD/MMM/YYYY) °                    ___9_:15______ (HH;MM)    []  Time Unknown  ° °Temperature: _98.8  F ° °Height: (Baseline only)   ____ [] Inches  []  cm [] Not Done   []  NA ° °Estimated pounds of fluid overload:  24 lbs ° °Is Subject experiencing Shortness of Breath?  [] Yes   [x]  No ° °Dyspnea VAS: (10 being the worst)   °[] 1 [] 2 [] 3 [] 4 [] 5 [] 6 [] 7 []8 [] 9 [] 10 ° °Is Subject experiencing Orthopnea?  [] Yes    [x] No ° °Is there evidence of Rales?  [] Yes  [x]  No °   If Yes, Characterize:   [] Rhonchi °    [] Crackles °    [] Wheezing °    [] Stridor °Ascites? []  Yes  [x]  No ° °Leg Edema?  [] Yes    [x] No °        If yes, grade of edema:  [] 1+  [] 2+  [] 3+  [] 4+ ° °Sacral Edema?  [] Yes   [x] No °       If yes, grade of edema:    [] 1+  [] 2+  [] 3+  [] 4+ ° °Jugular Vein Distention (JVD)?:  [] Yes  [x]  No °       If yes, provide measurement:   __5__ cm ° °NYHA  Classification:  [] I   [x] II  [] III  []  IV  []  N/A ° °Has subject expressed presence of thirst?  [] Yes  [x]  No °      If Yes, rate of thirst level (10 being the worst): °[] 1 [] 2 [] 3 [] 4 [] 5 [] 6 [] 7 []8 [] 9 [] 10 ° ° °++++  PROVIDER NOTES OR COMMENTS:  ° °      ° °Length of Stay: 4 ° °Rennee Coyne, MD  °06/23/2021, 9:14 AM ° °Advanced Heart Failure Team °Pager [phone] (M-F; 7a - 5p)  °Please contact [facility] for night-coverage after hours (5p -7a ) and weekends on [email or web address] ° ° ° °  °

## 2021-06-23 NOTE — H&P (View-Only) (Signed)
Advanced Heart Failure Rounding Note  PCP-Cardiologist: [name], MD   Subjective:    FASTR Trial stopped 3/11 at 7:20am due to persistent low urine output  Resting comfortably. Denies SOB, orthopnea or PND  Off diuretics. Weight stable overnight.   Objective:   Weight Range: 105 kg Body mass index is 37.36 kg/m.   Vital Signs:   Temp:  [97.4 F (36.3 C)-98.4 F (36.9 C)] 97.4 F (36.3 C) (03/12 0736) Pulse Rate:  [91-104] 104 (03/12 0736) Resp:  [15-20] 18 (03/12 0736) BP: (77-121)/(50-97) 121/97 (03/12 0736) SpO2:  [84 %-93 %] 84 % (03/12 0736) Weight:  [105 kg] 105 kg (03/12 0500) Last BM Date : 06/20/21  Weight change: Filed Weights   06/22/21 0527 06/22/21 0918 06/23/21 0500  Weight: 104.6 kg 105 kg 105 kg    Intake/Output:   Intake/Output Summary (Last 24 hours) at 06/23/2021 0914 Last data filed at 06/23/2021 0600 Gross per 24 hour  Intake 480 ml  Output 1100 ml  Net -620 ml       Physical Exam   General:  Lying in bed . No resp difficulty HEENT: normal Neck: supple. no JVD. Carotids 2+ bilat; no bruits. No lymphadenopathy or thryomegaly appreciated. Cor: PMI nondisplaced. Regular rate & rhythm. No rubs, gallops or murmurs. Lungs: clear Abdomen: obese soft, nontender, nondistended. No hepatosplenomegaly. No bruits or masses. Good bowel sounds. Extremities: no cyanosis, clubbing, rash, tr edema Neuro: alert & orientedx3, cranial nerves grossly intact. moves all 4 extremities w/o difficulty. Affect pleasant   Telemetry   SR 100-105 ~ 3 PVC/min Personally reviewed   Labs    CBC Recent Labs    06/21/21 0150 06/22/21 0756  WBC 5.0 4.9  HGB 13.6 16.0  HCT 42.4 50.4  MCV 103.2* 99.6  PLT 128* 327    Basic Metabolic Panel Recent Labs    06/22/21 2130 06/23/21 0500  NA 131* 130*  K 4.2 3.8  CL 83* 83*  CO2 36* 37*  GLUCOSE 109* 120*  BUN 21* 18  CREATININE 1.05 0.95  CALCIUM 8.9 9.0  MG 2.5* 2.6*    Liver Function  Tests Recent Labs    06/22/21 2130 06/23/21 0500  AST 23 23  ALT 19 22  ALKPHOS 48 57  BILITOT 1.2 0.9  PROT 6.9 7.3  ALBUMIN 3.5 3.7    No results for input(s): LIPASE, AMYLASE in the last 72 hours.  Cardiac Enzymes No results for input(s): CKTOTAL, CKMB, CKMBINDEX, TROPONINI in the last 72 hours.  BNP: BNP (last 3 results) Recent Labs    06/19/21 0957  BNP 235.5*     ProBNP (last 3 results) No results for input(s): PROBNP in the last 8760 hours.   D-Dimer No results for input(s): DDIMER in the last 72 hours.  Hemoglobin A1C No results for input(s): HGBA1C in the last 72 hours.  Fasting Lipid Panel No results for input(s): CHOL, HDL, LDLCALC, TRIG, CHOLHDL, LDLDIRECT in the last 72 hours. Thyroid Function Tests No results for input(s): TSH, T4TOTAL, T3FREE, THYROIDAB in the last 72 hours.  Invalid input(s): FREET3   Other results:   Imaging    No results found.   Medications:     Scheduled Medications:  aspirin  81 mg Oral Daily   chlorhexidine  15 mL Mouth Rinse BID   Chlorhexidine Gluconate Cloth  6 each Topical Daily   Chlorhexidine Gluconate Cloth  6 each Topical Daily   dapagliflozin propanediol  10 mg Oral Daily  digoxin  125 mcg Oral Daily   enoxaparin (LOVENOX) injection  40 mg Subcutaneous QHS   losartan  12.5 mg Oral BID   mouth rinse  15 mL Mouth Rinse q12n4p   mupirocin ointment  1 application. Nasal BID   simvastatin  20 mg Oral q1800   sodium chloride flush  10-40 mL Intracatheter Q12H   sodium chloride flush  3 mL Intravenous Q12H   spironolactone  25 mg Oral Daily    Infusions:  sodium chloride 250 mL (06/20/21 0933)    PRN Medications: sodium chloride, acetaminophen, sodium chloride flush    Patient Profile   46 y.o. male who has a history of VSD repair and removal of a pulmonary artery band at age 28, DM, HTN, systolic heart failure, NICM and OSA w/ poor compliance w/ CPAP, admitted for acute hypoxic respiratory  failure in setting of acute on chronic systolic heart failure and AKI.   Assessment/Plan   1. Acute Hypoxic/Hyercarbic Respiratory Failure - O2 sats reportedly in the 30s on arrival and cyanotic, improved on NRB - initially on HFNC, 6L/min and BiPAP at night - CO2 elevated. Suspect poor compliance w/ CPAP.  - COVID/Flu negative, D-dimer negative, PE unlikely  - CXR w/ pulmonary vascular congestion/ pulmonary edema  - Much improved with diuresis. No change today   2. Acute on Chronic Systolic Heart Failure - Nonischemic cardiomyopathy.  RHC in 4/16 showed preserved cardiac index by thermodilution but low by Fick.   - Last echo in 8/21 showed EF 30-35%, normal RV, no residual VSD, no definite pulmonic stenosis.   - Now w/ NYHA Class III-IIIb symptoms. Volume assessment difficult given body habitus, but appears fluid overloaded. BNP only mildly elevated at 235.5, but suspect falsely low in setting of obesity. CXR shows pulmonary edema. Med compliance has been questionable. -  FASTR Trial stopped at 7:20am due to persistent low urine output - Echo EF 30-35% RV moderately reduced.  - CVP 4.  Resume home torsemide at 20 bid - Entresto on hold d/t AKI and hyperkalemia. SBP remains 90-100. Tolerating losartan 12.5 bid - Continue spiro 12.5 mg daily - Continue Farxiga 10 mg  - Carvedilol and Bidil  on holdw/ soft BP  - continue digoxin 0.125 (dig level ok, 0.2) - Continue UNNA boots - Has St Jude ICD. Not candidate for CRT-D upgrade w/ narrow QRS 125 ms  - R/L cath tomorrow with [name]. Orders in.    3. AKI  - Baseline SCr 1.1 - 1.5 on admit, in setting of HF. ? Low output/cardiorenal. - Scr 0.95  today    4. PVCs - burden variable. Ranges 0-15 PVCs/min - consider Zio at d/c to evaluate total burden  5. Hyperkalemia - K 5.4 on admit, in setting of AKI  - Continue spiro 12.5 mg daily -  k 3.8 today   6. OSA - needs to improve compliance w/ CPAP  - BiPAP QHS while here   7.  Thrombocytopenia - plts 128 -> 167 - appears chronic, prior baseline ~140K  - monitor    8. Congenital Heart Disease - VSD repair and removal of a pulmonary artery band at age 40 - echo 8/21 showed no significant residual VSD, normal RV - echo 06/20/21 EF 30-35% no PI. Mild PS   9. Type 2DM  - SSI - continue Farxiga 10 mg  - A1c 6.4% in January. Recheck.   10. Poor Med Compliance - would benefit from paramedicine at d/c - will engage HF  SW team   11. Fever - Afebrile - Procalcitonin < 0.1 - Resp panel negative for COVID and influenza A/B - Bld CX  Gram + Cocci - final results pending.      SITE # 001    SUBJECT#  _7___      SUBJECT INITIALS:  BW___  INTERVAL: _0  Screening / Baseline _1   24 Hrs - During Tx       _2  48 Hrs - During Tx  _3   72 Hrs - End of Tx   _4  Post Tx - Within 24 Hrs _5  Post Tx - 72 hours after Tx Initiation    _6  Post Tx - 7 Days after Tx  Initiation    _7  Discharge   _8  7-Day Follow-Up   _9  30 -Day Follow-UP  Exam Performed:  _10  Yes  _11  No Exam Date; __3/12/2023__________  (DD/MMM/YYYY)                     ___9_:15______ (HH;MM)    _12   Time Unknown   Temperature: _98.8  F  Height: (Baseline only)   ____ _13  Inches  _14   cm _15  Not Done   _16   NA  Estimated pounds of fluid overload:  24 lbs  Is Subject experiencing Shortness of Breath?  _17  Yes   _18   No  Dyspnea VAS: (10 being the worst)   _19  1 _20  2 _21  3 _22  4 _23  5 _24  6 _25  7 _26 8 _27  9 _28  10  Is Subject experiencing Orthopnea?  _29  Yes    _30  No  Is there evidence of Rales?  _31  Yes  _32   No    If Yes, Characterize:   _33  Rhonchi     _34  Crackles     _35  Wheezing     _36  Stridor Ascites? _37   Yes  _38   No  Leg Edema?  _39  Yes    _40  No         If yes, grade of edema:  _41  1+  _42  2+  _43  3+  _44  4+  Sacral Edema?  _45  Yes   _46  No        If yes, grade of edema:    _47  1+  _48  2+  _49  3+  _50  4+  Jugular Vein Distention (JVD)?:  _51  Yes  _52   No        If yes, provide measurement:   __5__ cm  NYHA  Classification:  _53  I   _54  II  _55  III  _56   IV  _57   N/A  Has subject expressed presence of thirst?  _58  Yes  _59   No       If Yes, rate of thirst level (10 being the worst): _60  1 _61  2 _62  3 _63  4 _64  5 _65  6 _66  7 _67 8 _68  9 _69  10   ++++  PROVIDER NOTES OR COMMENTS:          Length of Stay: [place], MD  06/23/2021, 9:14 AM  Advanced Heart Failure Team Pager [phone] (M-F; 7a - 5p)  Please contact [facility] for night-coverage after hours (5p -7a ) and weekends on [email or web address]

## 2021-06-24 ENCOUNTER — Other Ambulatory Visit: Payer: Self-pay | Admitting: Cardiology

## 2021-06-24 ENCOUNTER — Inpatient Hospital Stay (HOSPITAL_COMMUNITY)
Admission: EM | Disposition: A | Discharge status: Home / Self Care | Payer: Self-pay | Source: Home / Self Care | Attending: Cardiology

## 2021-06-24 ENCOUNTER — Other Ambulatory Visit (HOSPITAL_COMMUNITY): Payer: Self-pay

## 2021-06-24 ENCOUNTER — Encounter (HOSPITAL_COMMUNITY): Payer: Self-pay | Admitting: Cardiology

## 2021-06-24 HISTORY — PX: RIGHT/LEFT HEART CATH AND CORONARY ANGIOGRAPHY: CATH118266

## 2021-06-24 LAB — POCT I-STAT EG7
Acid-Base Excess: 13 mmol/L — ABNORMAL HIGH (ref 0.0–2.0)
Acid-Base Excess: 12 mmol/L — ABNORMAL HIGH (ref 0.0–2.0)
Bicarbonate: 43.4 mmol/L — ABNORMAL HIGH (ref 20.0–28.0)
Bicarbonate: 42 mmol/L — ABNORMAL HIGH (ref 20.0–28.0)
Calcium, Ion: 1.19 mmol/L (ref 1.15–1.40)
Calcium, Ion: 1.14 mmol/L — ABNORMAL LOW (ref 1.15–1.40)
HCT: 48 % (ref 39.0–52.0)
HCT: 47 % (ref 39.0–52.0)
Hemoglobin: 16.3 g/dL (ref 13.0–17.0)
Hemoglobin: 16 g/dL (ref 13.0–17.0)
O2 Saturation: 73 %
O2 Saturation: 74 %
Potassium: 4 mmol/L (ref 3.5–5.1)
Potassium: 3.9 mmol/L (ref 3.5–5.1)
Sodium: 135 mmol/L (ref 135–145)
Sodium: 136 mmol/L (ref 135–145)
TCO2: 46 mmol/L — ABNORMAL HIGH (ref 22–32)
TCO2: 44 mmol/L — ABNORMAL HIGH (ref 22–32)
pCO2, Ven: 81.7 mmHg (ref 44–60)
pCO2, Ven: 78.7 mmHg (ref 44–60)
pH, Ven: 7.333 (ref 7.25–7.43)
pH, Ven: 7.335 (ref 7.25–7.43)
pO2, Ven: 44 mmHg (ref 32–45)
pO2, Ven: 44 mmHg (ref 32–45)

## 2021-06-24 LAB — MAGNESIUM
Magnesium: 2.1 mg/dL (ref 1.7–2.4)
Magnesium: 2.1 mg/dL (ref 1.7–2.4)

## 2021-06-24 LAB — COMPREHENSIVE METABOLIC PANEL
ALT: 25 U/L (ref 0–44)
ALT: 25 U/L (ref 0–44)
AST: 22 U/L (ref 15–41)
AST: 23 U/L (ref 15–41)
Albumin: 3.5 g/dL (ref 3.5–5.0)
Albumin: 3.3 g/dL — ABNORMAL LOW (ref 3.5–5.0)
Alkaline Phosphatase: 51 U/L (ref 38–126)
Alkaline Phosphatase: 44 U/L (ref 38–126)
Anion gap: 9 (ref 5–15)
Anion gap: 8 (ref 5–15)
BUN: 20 mg/dL (ref 6–20)
BUN: 17 mg/dL (ref 6–20)
CO2: 38 mmol/L — ABNORMAL HIGH (ref 22–32)
CO2: 38 mmol/L — ABNORMAL HIGH (ref 22–32)
Calcium: 8.9 mg/dL (ref 8.9–10.3)
Calcium: 8.5 mg/dL — ABNORMAL LOW (ref 8.9–10.3)
Chloride: 88 mmol/L — ABNORMAL LOW (ref 98–111)
Chloride: 89 mmol/L — ABNORMAL LOW (ref 98–111)
Creatinine, Ser: 0.96 mg/dL (ref 0.61–1.24)
Creatinine, Ser: 0.89 mg/dL (ref 0.61–1.24)
GFR, Estimated: >60 mL/min (ref >60)
GFR, Estimated: >60 mL/min (ref >60)
Glucose, Bld: 124 mg/dL — ABNORMAL HIGH (ref 70–99)
Glucose, Bld: 154 mg/dL — ABNORMAL HIGH (ref 70–99)
Potassium: 3.6 mmol/L (ref 3.5–5.1)
Potassium: 3.8 mmol/L (ref 3.5–5.1)
Sodium: 135 mmol/L (ref 135–145)
Sodium: 135 mmol/L (ref 135–145)
Total Bilirubin: 0.6 mg/dL (ref 0.3–1.2)
Total Bilirubin: 0.8 mg/dL (ref 0.3–1.2)
Total Protein: 7.1 g/dL (ref 6.5–8.1)
Total Protein: 6.5 g/dL (ref 6.5–8.1)

## 2021-06-24 LAB — CBC
HCT: 44.6 % (ref 39.0–52.0)
HCT: 44.7 % (ref 39.0–52.0)
Hemoglobin: 14.9 g/dL (ref 13.0–17.0)
Hemoglobin: 14.1 g/dL (ref 13.0–17.0)
MCH: 32.7 pg (ref 26.0–34.0)
MCH: 31.4 pg (ref 26.0–34.0)
MCHC: 33.4 g/dL (ref 30.0–36.0)
MCHC: 31.5 g/dL (ref 30.0–36.0)
MCV: 98 fL (ref 80.0–100.0)
MCV: 99.6 fL (ref 80.0–100.0)
Platelets: 140 10*3/uL — ABNORMAL LOW (ref 150–400)
Platelets: 137 10*3/uL — ABNORMAL LOW (ref 150–400)
RBC: 4.55 MIL/uL (ref 4.22–5.81)
RBC: 4.49 MIL/uL (ref 4.22–5.81)
RDW: 12.8 % (ref 11.5–15.5)
RDW: 12.7 % (ref 11.5–15.5)
WBC: 4.4 10*3/uL (ref 4.0–10.5)
WBC: 4 10*3/uL (ref 4.0–10.5)
nRBC: 0 % (ref 0.0–0.2)
nRBC: 0 % (ref 0.0–0.2)

## 2021-06-24 LAB — POTASSIUM, URINE, 24 HOUR: Potassium Urine: 11 mmol/L

## 2021-06-24 LAB — GLUCOSE, CAPILLARY
Glucose-Capillary: 148 mg/dL — ABNORMAL HIGH (ref 70–99)
Glucose-Capillary: 164 mg/dL — ABNORMAL HIGH (ref 70–99)
Glucose-Capillary: 124 mg/dL — ABNORMAL HIGH (ref 70–99)

## 2021-06-24 LAB — SPECIFIC GRAVITY, URINE: Specific Gravity, UA: 1.01 (ref 1.005–1.030)

## 2021-06-24 LAB — CREATININE, URINE, 24 HOUR: Creatinine, Urine: 14.3 mg/dL

## 2021-06-24 LAB — OSMOLALITY, URINE: Osmolality, Ur: 348 mOsmol/kg

## 2021-06-24 LAB — SODIUM, URINE, 24 HOUR: Sodium, Ur: 110 mmol/L

## 2021-06-24 LAB — COOXEMETRY PANEL
Carboxyhemoglobin: 1.6 % — ABNORMAL HIGH (ref 0.5–1.5)
Methemoglobin: <0.7 % (ref 0.0–1.5)
O2 Saturation: 78.8 %
Total hemoglobin: 15.3 g/dL (ref 12.0–16.0)

## 2021-06-24 LAB — CHLORIDE, URINE, TIMED: Chloride, Ur: 115 mmol/L

## 2021-06-24 SURGERY — RIGHT/LEFT HEART CATH AND CORONARY ANGIOGRAPHY
Anesthesia: LOCAL

## 2021-06-24 MED ORDER — SACUBITRIL-VALSARTAN 24-26 MG PO TABS
1.0000 | ORAL_TABLET | Freq: Two times a day (BID) | ORAL | 1 refills | Status: DC
  Filled 2021-06-24: qty 60, 30d supply, fill #0

## 2021-06-24 MED ORDER — CARVEDILOL 3.125 MG PO TABS
3.1250 mg | ORAL_TABLET | Freq: Two times a day (BID) | ORAL | Status: DC
  Administered 2021-06-24: 3.125 mg via ORAL
  Filled 2021-06-24: qty 1

## 2021-06-24 MED ORDER — HYDRALAZINE HCL 20 MG/ML IJ SOLN: 10.0000 mg | INTRAMUSCULAR | Status: AC | PRN

## 2021-06-24 MED ORDER — SODIUM CHLORIDE 0.9% FLUSH: 3.0000 mL | INTRAVENOUS | Status: DC | PRN

## 2021-06-24 MED ORDER — SODIUM CHLORIDE 0.9 % IV SOLN: 250.0000 mL | INTRAVENOUS | Status: DC | PRN

## 2021-06-24 MED ORDER — FENTANYL CITRATE (PF) 100 MCG/2ML IJ SOLN
INTRAMUSCULAR | Status: DC | PRN
Start: 2021-06-24 — End: 2021-06-24
  Administered 2021-06-24: 25 ug via INTRAVENOUS

## 2021-06-24 MED ORDER — IOHEXOL 350 MG/ML SOLN
INTRAVENOUS | Status: DC | PRN
  Administered 2021-06-24: 75 mL

## 2021-06-24 MED ORDER — CARVEDILOL 3.125 MG PO TABS
3.1250 mg | ORAL_TABLET | Freq: Two times a day (BID) | ORAL | 1 refills | Status: DC
Start: 2021-06-24 — End: 2021-09-17
  Filled 2021-06-24: qty 60, 30d supply, fill #0
  Filled 2021-07-17: qty 60, 30d supply, fill #1

## 2021-06-24 MED ORDER — VERAPAMIL HCL 2.5 MG/ML IV SOLN
INTRAVENOUS | Status: AC
Start: 2021-06-24 — End: ?
  Filled 2021-06-24: qty 2

## 2021-06-24 MED ORDER — LIDOCAINE HCL (PF) 1 % IJ SOLN
INTRAMUSCULAR | Status: DC | PRN
  Administered 2021-06-24 (×2): 2 mL

## 2021-06-24 MED ORDER — POTASSIUM CHLORIDE CRYS ER 20 MEQ PO TBCR
40.0000 meq | EXTENDED_RELEASE_TABLET | Freq: Once | ORAL | Status: AC
  Administered 2021-06-24: 40 meq via ORAL
  Filled 2021-06-24: qty 2

## 2021-06-24 MED ORDER — HEPARIN (PORCINE) IN NACL 1000-0.9 UT/500ML-% IV SOLN
INTRAVENOUS | Status: AC
  Filled 2021-06-24: qty 1000

## 2021-06-24 MED ORDER — TORSEMIDE 20 MG PO TABS
20.0000 mg | ORAL_TABLET | Freq: Two times a day (BID) | ORAL | 1 refills | Status: DC
Start: 2021-06-24 — End: 2021-07-16
  Filled 2021-06-24: qty 60, 30d supply, fill #0

## 2021-06-24 MED ORDER — VERAPAMIL HCL 2.5 MG/ML IV SOLN
INTRAVENOUS | Status: DC | PRN
  Administered 2021-06-24: 10 mL via INTRA_ARTERIAL

## 2021-06-24 MED ORDER — DAPAGLIFLOZIN PROPANEDIOL 10 MG PO TABS
10.0000 mg | ORAL_TABLET | Freq: Every day | ORAL | 1 refills | Status: DC
  Filled 2021-06-24: qty 30, 30d supply, fill #0
  Filled 2021-07-17: qty 30, 30d supply, fill #1

## 2021-06-24 MED ORDER — ENOXAPARIN SODIUM 40 MG/0.4ML IJ SOSY: 40.0000 mg | PREFILLED_SYRINGE | INTRAMUSCULAR | Status: DC

## 2021-06-24 MED ORDER — ONDANSETRON HCL 4 MG/2ML IJ SOLN: 4.0000 mg | Freq: Four times a day (QID) | INTRAMUSCULAR | Status: DC | PRN

## 2021-06-24 MED ORDER — LABETALOL HCL 5 MG/ML IV SOLN: 10.0000 mg | INTRAVENOUS | Status: AC | PRN

## 2021-06-24 MED ORDER — SACUBITRIL-VALSARTAN 24-26 MG PO TABS
1.0000 | ORAL_TABLET | Freq: Two times a day (BID) | ORAL | Status: DC
  Administered 2021-06-24: 1 via ORAL
  Filled 2021-06-24: qty 1

## 2021-06-24 MED ORDER — LIDOCAINE HCL (PF) 1 % IJ SOLN
INTRAMUSCULAR | Status: AC
  Filled 2021-06-24: qty 30

## 2021-06-24 MED ORDER — SODIUM CHLORIDE 0.9% FLUSH
3.0000 mL | Freq: Two times a day (BID) | INTRAVENOUS | Status: DC
  Administered 2021-06-24: 3 mL via INTRAVENOUS

## 2021-06-24 MED ORDER — HEPARIN SODIUM (PORCINE) 1000 UNIT/ML IJ SOLN
INTRAMUSCULAR | Status: DC | PRN
  Administered 2021-06-24: 5000 [IU] via INTRAVENOUS

## 2021-06-24 MED ORDER — ACETAMINOPHEN 325 MG PO TABS: 650.0000 mg | ORAL_TABLET | ORAL | Status: DC | PRN

## 2021-06-24 MED ORDER — HEPARIN SODIUM (PORCINE) 1000 UNIT/ML IJ SOLN
INTRAMUSCULAR | Status: AC
  Filled 2021-06-24: qty 10

## 2021-06-24 MED ORDER — SPIRONOLACTONE 25 MG PO TABS
25.0000 mg | ORAL_TABLET | Freq: Every day | ORAL | 1 refills | Status: DC
  Filled 2021-06-24: qty 30, 30d supply, fill #0
  Filled 2021-09-17: qty 30, 30d supply, fill #1

## 2021-06-24 MED ORDER — MIDAZOLAM HCL 2 MG/2ML IJ SOLN
INTRAMUSCULAR | Status: AC
  Filled 2021-06-24: qty 2

## 2021-06-24 MED ORDER — MIDAZOLAM HCL 2 MG/2ML IJ SOLN
INTRAMUSCULAR | Status: DC | PRN
  Administered 2021-06-24: 1 mg via INTRAVENOUS

## 2021-06-24 MED ORDER — FENTANYL CITRATE (PF) 100 MCG/2ML IJ SOLN
INTRAMUSCULAR | Status: AC
  Filled 2021-06-24: qty 2

## 2021-06-24 SURGICAL SUPPLY — 12 items
CATH 5FR JL3.5 JR4 ANG PIG MP (CATHETERS) ×2 IMPLANT
CATH BALLN WEDGE 5F 110CM (CATHETERS) ×2 IMPLANT
DEVICE RAD COMP TR BAND LRG (VASCULAR PRODUCTS) ×2 IMPLANT
GLIDESHEATH SLEND SS 6F .021 (SHEATH) ×2 IMPLANT
GUIDEWIRE .025 260CM (WIRE) ×2 IMPLANT
GUIDEWIRE INQWIRE 1.5J.035X260 (WIRE) IMPLANT
INQWIRE 1.5J .035X260CM (WIRE) ×3
KIT HEART LEFT (KITS) ×3 IMPLANT
PACK CARDIAC CATHETERIZATION (CUSTOM PROCEDURE TRAY) ×3 IMPLANT
SHEATH GLIDE SLENDER 4/5FR (SHEATH) ×2 IMPLANT
TRANSDUCER W/STOPCOCK (MISCELLANEOUS) ×3 IMPLANT
WIRE EMERALD 3MM-J .025X260CM (WIRE) ×2 IMPLANT

## 2021-06-24 NOTE — Plan of Care (Signed)
?  Problem: Education: ?Goal: Knowledge of General Education information will improve ?Description: Including pain rating scale, medication(s)/side effects and non-pharmacologic comfort measures ?Outcome: Adequate for Discharge ?  ?Problem: Health Behavior/Discharge Planning: ?Goal: Ability to manage health-related needs will improve ?Outcome: Adequate for Discharge ?  ?Problem: Clinical Measurements: ?Goal: Ability to maintain clinical measurements within normal limits will improve ?Outcome: Adequate for Discharge ?Goal: Will remain free from infection ?Outcome: Adequate for Discharge ?Goal: Diagnostic test results will improve ?Outcome: Adequate for Discharge ?Goal: Respiratory complications will improve ?Outcome: Adequate for Discharge ?Goal: Cardiovascular complication will be avoided ?Outcome: Adequate for Discharge ?  ?Problem: Activity: ?Goal: Risk for activity intolerance will decrease ?Outcome: Adequate for Discharge ?  ?Problem: Nutrition: ?Goal: Adequate nutrition will be maintained ?Outcome: Adequate for Discharge ?  ?Problem: Coping: ?Goal: Level of anxiety will decrease ?Outcome: Adequate for Discharge ?  ?Problem: Elimination: ?Goal: Will not experience complications related to bowel motility ?Outcome: Adequate for Discharge ?Goal: Will not experience complications related to urinary retention ?Outcome: Adequate for Discharge ?  ?Problem: Pain Managment: ?Goal: General experience of comfort will improve ?Outcome: Adequate for Discharge ?  ?Problem: Safety: ?Goal: Ability to remain free from injury will improve ?Outcome: Adequate for Discharge ?  ?Problem: Education: ?Goal: Ability to demonstrate management of disease process will improve ?Outcome: Adequate for Discharge ?Goal: Ability to verbalize understanding of medication therapies will improve ?Outcome: Adequate for Discharge ?Goal: Individualized Educational Video(s) ?Outcome: Adequate for Discharge ?  ?Problem: Activity: ?Goal: Capacity to carry  out activities will improve ?Outcome: Adequate for Discharge ?  ?Problem: Cardiac: ?Goal: Ability to achieve and maintain adequate cardiopulmonary perfusion will improve ?Outcome: Adequate for Discharge ?  ?

## 2021-06-24 NOTE — Discharge Summary (Cosign Needed)
Advanced Heart Failure Team  Discharge Summary   Patient ID: Kevin Cardenas MRN: [id number], DOB/AGE: Jun 21, 1975 46 y.o. Admit date: 06/19/2021 D/C date:     06/24/2021   Primary Discharge Diagnoses:  Acute on chronic systolic CHF NICM Acute respiratory failure with hypoxia Pulmonary stenosis H/o VSD OSA/OHS AKI  Hospital Course:  Kevin Cardenas is a 46 y.o. male who has a history of VSD repair and removal of a pulmonary artery band at age 53, DM, HTN, systolic heart failure, NICM and OSA diagnosed in past but not on CPAP. Has [employer] ICD (placed 01/2015).    Echo in 3/20 showed EF 25%, mild LV dilation, mildly decreased RV systolic function.    CPX in 10/20 showed moderate functional limitation primarily due to restrictive lung disease. Subsequent high resolution chest CT 8/21 showed no evidence of interstitial lung disease. PFTs were ordered but he never completed.    Echo in 8/21 showed EF 30-35%, no significant residual VSD, normal RV.    Admitted on 06/19/21 with acute on chronic respiratory failure with hypoxia secondary to a/c CHF and noncompliance with CPAP for OSA/OHS. PICC line placed. Co-ox okay. He was enrolled in FASTR trial for diuresis. Diuresed 24 lb. FASTR trial discontinued on 03/11. Transitioned to po Torsemide 20 mg BID (prior home dose). GDMT titrated.  R/LHC on 03/13 with normal coronaries, normal filling pressures and normal CO. Mild pulmonary HTN.  D/t questionable medication compliance at home he was referred to paramedicine.   Seen by TOC CM. HH PT/OT recommended. Patient declined [facility].    Hospital Course by Problem: 1. Acute on chronic systolic CHF: Nonischemic cardiomyopathy.  RHC in 4/16 showed preserved cardiac index by thermodilution but low by Fick.   Last echo in 8/21 showed EF 30-35%, normal RV, no residual VSD, no definite pulmonic stenosis.  He has a Research officer, political party ICD. He has an IVCD but QRS not long enough for CRT.  NYHA class IV at admission with  hypoxemia.  BP has been soft in the hospital, not sure if he is actually compliant with his meds though he says that he is. Echo this admission with E 30-35%, no VSD noted, no significant pulmonary stenosis (peak gradient 6), moderate RV enlargement with normal systolic function.  Excellent diuresis with Reprieve device, now back on po torsemide.  RHC with normal filling pressures and cardiac output. Coronary angiography with no significant coronary disease.  - Continue torsemide 20 mg bid.   - Can transition off losartan and onto Entresto 24/26 bid.  - Restart Coreg 3.125 mg bid.  - Continue spironolactone 25 daily.  - Continue digoxin 0.125.    - Continue Farxiga 10 mg daily.   - Probably not Cardiomems candidate due to Medicaid.  - Questionable medication compliance at home => will enlist paramedicine.  2. H/o VSD: No residual shunt noted on echo this admission.  Last RHC also did not suggest any significant left to right shunting.  3. Pulmonary stenosis: Very mild by 7/16 echo, no significant stenosis 8/21 echo or this admission.  Mild PS murmur on exam. No evidence for significant pulmonary stenosis on RHC today.  4. OSA/?OHS: On CPAP at home, markedly hypercarbic at admission.  Now improved.  - Pulmonary has seen.  - Needs to use CPAP at home regularly.  5. ID: No evidence for active infection.   6. AKI: Creatinine improved to 0.96.    Discharge Weight Range: 255>233 lb  Discharge Vitals: Blood pressure (!) 159/81,  pulse 96, temperature 97.6 F (36.4 C), temperature source Axillary, resp. rate 19, height _0  (1.676 m), weight 106 kg, SpO2 90 %.  Labs: Lab Results  Component Value Date   WBC 4.0 06/24/2021   HGB 14.1 06/24/2021   HCT 44.7 06/24/2021   MCV 99.6 06/24/2021   PLT 137 (L) 06/24/2021    Recent Labs  Lab 06/24/21 1149  NA 135  K 3.8  CL 89*  CO2 38*  BUN 17  CREATININE 0.89  CALCIUM 8.5*  PROT 6.5  BILITOT 0.8  ALKPHOS 44  ALT 25  AST 23  GLUCOSE 154*    Lab Results  Component Value Date   CHOL 128 04/17/2021   HDL 43 04/17/2021   LDLCALC 74 04/17/2021   TRIG 45 04/17/2021   BNP (last 3 results) Recent Labs    06/19/21 0957  BNP 235.5*    ProBNP (last 3 results) No results for input(s): PROBNP in the last 8760 hours.   Diagnostic Studies/Procedures   CARDIAC CATHETERIZATION  Result Date: 06/24/2021 1. Normal coronaries. 2. Normal filling pressures. 3. Normal cardiac output. 4. Mild pulmonary hypertension, suspect related to OHS/OSA.    Echo, 06/20/2021: IMPRESSIONS:  1. Left ventricular ejection fraction, by estimation, is 30 to 35%. The left ventricle has moderately decreased function. The left ventricle demonstrates global hypokinesis. There is mild concentric left ventricular hypertrophy. Left ventricular diastolic parameters are indeterminate.   2. There is no evidence of residual VSD by color or spectral Doppler, suspect that prior VSD was perimembranous.   3. History of congenital pumonartery banding without residual pulmonic stenosis by spectral Doppler. Peak gradient 6 mm Hg.   4. Right ventricular systolic function is normal. The right ventricular size is moderately enlarged.   5. The mitral valve is normal in structure. No evidence of mitral valve regurgitation. No evidence of mitral stenosis.   6. The aortic valve was not well visualized. Aortic valve regurgitation is not visualized.    Discharge Medications   Allergies as of 06/24/2021       Reactions   Metolazone Other (See Comments)   "Dried up my kidneys- took only one tablet"        Medication List     STOP taking these medications    Entresto 97-103 MG Generic drug: sacubitril-valsartan Replaced by: sacubitril-valsartan 24-26 MG   ibuprofen 200 MG tablet Commonly known as: ADVIL   isosorbide-hydrALAZINE 20-37.5 MG tablet Commonly known as: BiDil       TAKE these medications    ascorbic acid 500 MG tablet Commonly known as: VITAMIN  C Take 500-1,000 mg by mouth daily.   aspirin 81 MG chewable tablet Chew 1 tablet (81 mg total) by mouth daily.   carvedilol 3.125 MG tablet Commonly known as: COREG Take 1 tablet (3.125 mg total) by mouth 2 (two) times daily with a meal. What changed:  medication strength how much to take   dapagliflozin propanediol 10 MG Tabs tablet Commonly known as: FARXIGA Take 1 tablet (10 mg total) by mouth daily. Start taking on: June 25, 2021   digoxin 0.125 MG tablet Commonly known as: LANOXIN Take 1 tablet (125 mcg total) by mouth daily.   fluticasone furoate-vilanterol 200-25 MCG/INH Aepb Commonly known as: Breo Ellipta Inhale 1 puff into the lungs daily.   potassium chloride SA 20 MEQ tablet Commonly known as: KLOR-CON M Take 1 tablet (20 mEq total) by mouth daily.   sacubitril-valsartan 24-26 MG Commonly known as: ENTRESTO Take 1 tablet  by mouth 2 (two) times daily. Replaces: Entresto 97-103 MG   simvastatin 20 MG tablet Commonly known as: ZOCOR Take 1 tablet (20 mg total) by mouth at bedtime.   spironolactone 25 MG tablet Commonly known as: ALDACTONE Take 1 tablet (25 mg total) by mouth daily.   torsemide 20 MG tablet Commonly known as: DEMADEX Take 1 tablet (20 mg total) by mouth 2 (two) times daily. What changed: when to take this               Durable Medical Equipment  (From admission, onward)           Start     Ordered   06/24/21 1723  Heart failure home health orders  (Heart failure home health orders / Face to face)  Once       Comments: Heart Failure Follow-up Care:  Verify follow-up appointments per Patient Discharge Instructions. Confirm transportation arranged. Reconcile home medications with discharge medication list. Remove discontinued medications from use. Assist patient/caregiver to manage medications using pill box. Reinforce low sodium food selection Assessments: Vital signs and oxygen saturation at each visit. Assess home  environment for safety concerns, caregiver support and availability of low-sodium foods. Consult Education officer, museum, PT/OT, Dietitian, and CNA based on assessments. Perform comprehensive cardiopulmonary assessment. Notify MD for any change in condition or weight gain of 3 pounds in one day or 5 pounds in one week with symptoms. Daily Weights and Symptom Monitoring: Ensure patient has access to scales. Teach patient/caregiver to weigh daily before breakfast and after voiding using same scale and record.    Teach patient/caregiver to track weight and symptoms and when to notify Provider. Activity: Develop individualized activity plan with patient/caregiver.   Question Answer Comment  Heart Failure Follow-up Care [facility] at [phone]   Obtain the following labs Basic Metabolic Panel   Lab frequency Weekly   Fax lab results to AHF Clinic at [phone]   Diet Low Sodium Heart Healthy   Fluid restrictions: 1500 mL Fluid      06/24/21 1723            Disposition   The patient will be discharged in stable condition to home. Discharge Instructions     (HEART FAILURE PATIENTS) Call MD:  Anytime you have any of the following symptoms: 1) 3 pound weight gain in 24 hours or 5 pounds in 1 week 2) shortness of breath, with or without a dry hacking cough 3) swelling in the hands, feet or stomach 4) if you have to sleep on extra pillows at night in order to breathe.   Complete by: As directed    Diet - low sodium heart healthy   Complete by: As directed    Increase activity slowly   Complete by: As directed    PICC line removal   Complete by: As directed    STOP any activity that causes chest pain, shortness of breath, dizziness, sweating, or exessive weakness   Complete by: As directed        Follow-up Information     [facility] [place] Follow up on 06/28/2021.   Specialty: Cardiology Why: Advanced Heart Failure Clinic at  [facility] 8:40 am Entrance C, Garage Code 2482 Contact information: [address] [id number] [place] [phone]                  Duration of Discharge Encounter: Greater than 35 minutes   Signed,  Bryant Saye N  06/24/2021, 5:27 PM

## 2021-06-24 NOTE — Progress Notes (Signed)
Patient ID: Kevin Cardenas, male   DOB: [birth year], 46 y.o.   MRN: [id number]     Advanced Heart Failure Rounding Note  PCP-Cardiologist: [name], MD   Subjective:    FASTR Trial stopped 3/11 at 7:20am due to persistent low urine output. Now back on po torsemide.   No complaints today, breathing normally at rest.   RHC Procedural Findings: Hemodynamics (mmHg) RA mean 8 RV 44/13 PA 47/27, mean 37 PCWP mean 14 LV 113/14 AO 113/74 Oxygen saturations: PA 74% AO 94% Cardiac Output (Fick) 7.01  Cardiac Index (Fick) 3.28 PVR 3.3 WU  Normal coronary arteries  Objective:   Weight Range: 105.7 kg Body mass index is 37.61 kg/m.   Vital Signs:   Temp:  [97.4 F (36.3 C)-97.6 F (36.4 C)] 97.6 F (36.4 C) (03/13 0357) Pulse Rate:  [0-97] 89 (03/13 0835) Resp:  [13-21] 16 (03/13 0835) BP: (95-142)/(60-80) 130/80 (03/13 0835) SpO2:  [91 %-100 %] 94 % (03/13 0835) Weight:  [105.7 kg-106.4 kg] 105.7 kg (03/13 0357) Last BM Date : 06/23/21  Weight change: Filed Weights   06/23/21 0500 06/23/21 1502 06/24/21 0357  Weight: 105 kg 106.4 kg 105.7 kg    Intake/Output:   Intake/Output Summary (Last 24 hours) at 06/24/2021 0855 Last data filed at 06/24/2021 0400 Gross per 24 hour  Intake 1080 ml  Output 3370 ml  Net -2290 ml      Physical Exam   General: NAD Neck: No JVD, no thyromegaly or thyroid nodule.  Lungs: Clear to auscultation bilaterally with normal respiratory effort. CV: Nondisplaced PMI.  Heart regular S1/S2, no S3/S4, 1/6 SEM USB.  No peripheral edema.   Abdomen: Soft, nontender, no hepatosplenomegaly, no distention.  Skin: Intact without lesions or rashes.  Neurologic: Alert and oriented x 3.  Psych: Normal affect. Extremities: No clubbing or cyanosis.  HEENT: Normal.   Telemetry   NSR 90s (personally reviewed)   Labs    CBC Recent Labs    06/23/21 0946 06/24/21 0520  WBC 4.6 4.4  HGB 14.9 14.9  HCT 45.6 44.6  MCV 100.2* 98.0  PLT  141* 440*   Basic Metabolic Panel Recent Labs    06/23/21 0500 06/24/21 0520  NA 130* 135  K 3.8 3.6  CL 83* 88*  CO2 37* 38*  GLUCOSE 120* 124*  BUN 18 20  CREATININE 0.95 0.96  CALCIUM 9.0 8.9  MG 2.6* 2.1   Liver Function Tests Recent Labs    06/23/21 0500 06/24/21 0520  AST 23 22  ALT 22 25  ALKPHOS 57 51  BILITOT 0.9 0.6  PROT 7.3 7.1  ALBUMIN 3.7 3.5   No results for input(s): LIPASE, AMYLASE in the last 72 hours.  Cardiac Enzymes No results for input(s): CKTOTAL, CKMB, CKMBINDEX, TROPONINI in the last 72 hours.  BNP: BNP (last 3 results) Recent Labs    06/19/21 0957  BNP 235.5*    ProBNP (last 3 results) No results for input(s): PROBNP in the last 8760 hours.   D-Dimer No results for input(s): DDIMER in the last 72 hours.  Hemoglobin A1C No results for input(s): HGBA1C in the last 72 hours.  Fasting Lipid Panel No results for input(s): CHOL, HDL, LDLCALC, TRIG, CHOLHDL, LDLDIRECT in the last 72 hours. Thyroid Function Tests No results for input(s): TSH, T4TOTAL, T3FREE, THYROIDAB in the last 72 hours.  Invalid input(s): FREET3   Other results:   Imaging    CARDIAC CATHETERIZATION  Result Date: 06/24/2021 1. Normal coronaries.  2. Normal filling pressures. 3. Normal cardiac output. 4. Mild pulmonary hypertension, suspect related to OHS/OSA.     Medications:     Scheduled Medications:  aspirin  81 mg Oral Daily   carvedilol  3.125 mg Oral BID WC   chlorhexidine  15 mL Mouth Rinse BID   Chlorhexidine Gluconate Cloth  6 each Topical Daily   dapagliflozin propanediol  10 mg Oral Daily   digoxin  125 mcg Oral Daily   enoxaparin (LOVENOX) injection  40 mg Subcutaneous QHS   mouth rinse  15 mL Mouth Rinse q12n4p   mupirocin ointment  1 application. Nasal BID   potassium chloride  40 mEq Oral Once   potassium chloride  40 mEq Oral Once   sacubitril-valsartan  1 tablet Oral BID   simvastatin  20 mg Oral q1800   sodium chloride flush   10-40 mL Intracatheter Q12H   sodium chloride flush  3 mL Intravenous Q12H   spironolactone  25 mg Oral Daily   torsemide  20 mg Oral BID    Infusions:  sodium chloride 250 mL (06/20/21 0933)    PRN Medications: sodium chloride, acetaminophen, sodium chloride flush    Patient Profile   46 y.o. male who has a history of VSD repair and removal of a pulmonary artery band at age 34, DM, HTN, systolic heart failure, NICM and OSA w/ poor compliance w/ CPAP, admitted for acute hypoxic respiratory failure in setting of acute on chronic systolic heart failure and AKI.   Assessment/Plan   1. Acute on chronic systolic CHF: Nonischemic cardiomyopathy.  RHC in 4/16 showed preserved cardiac index by thermodilution but low by Fick.   Last echo in 8/21 showed EF 30-35%, normal RV, no residual VSD, no definite pulmonic stenosis.  He has a Research officer, political party ICD. He has an IVCD but QRS not long enough for CRT.  NYHA class IV at admission with hypoxemia.  BP has been soft in the hospital, not sure if he is actually compliant with his meds though he says that he is. Echo this admission with E 30-35%, no VSD noted, no significant pulmonary stenosis (peak gradient 6), moderate RV enlargement with normal systolic function.  Excellent diuresis with Reprieve device, now back on po torsemide.  RHC today with normal filling pressures and cardiac output.  Coronary angiography with no significant coronary disease.  - Continue torsemide 20 mg bid.   - Can transition off losartan and onto Entresto 24/26 bid.  - Restart Coreg 3.125 mg bid.  - Continue spironolactone 25 daily.  - Continue digoxin 0.125.    - Continue Farxiga 10 mg daily.   - Probably not Cardiomems candidate due to Medicaid.  - Questionable medication compliance at home => will enlist paramedicine.  2. H/o VSD: No residual shunt noted on echo this admission.  Last RHC also did not suggest any significant left to right shunting.  3. Pulmonary stenosis: Very mild  by 7/16 echo, no significant stenosis 8/21 echo or this admission.  Mild PS murmur on exam. No evidence for significant pulmonary stenosis on RHC today.  4. OSA/?OHS: On CPAP at home, markedly hypercarbic at admission.  Now improved.  - Pulmonary has seen.  - Needs to use CPAP at home regularly.  5. ID: No evidence for active infection.   6. AKI: Creatinine improved to 0.96.  7. Disposition: Patient can go home today.  Will need paramedicine.  Will need close followup in CHF clinic.  Meds for home: torsemide 20  mg bid, KCl 20 daily, digoxin 0.125 daily, Coreg 3.125 mg bid, Entresto 24/26 bid, Farxiga 10 mg daily, spironolactone 25 mg daily    SITE # 001    SUBJECT#  _7___      SUBJECT INITIALS:  BW___  INTERVAL: _0  Screening / Baseline _1   24 Hrs - During Tx       _2  48 Hrs - During Tx  _3   72 Hrs - End of Tx   _4  Post Tx - Within 24 Hrs _5  Post Tx - 72 hours after Tx Initiation    _6  Post Tx - 7 Days after Tx  Initiation    _7  Discharge   _8  7-Day Follow-Up   _9  30 -Day Follow-UP  Exam Performed:  _10  Yes  _11  No Exam Date; __3/13/2023__________  (DD/MMM/YYYY)                     ___8_:30______ (HH;MM)    _12   Time Unknown   Temperature: _97.6  F  Height: (Baseline only)   ____ _13  Inches  _14   cm _15  Not Done   _16   NA  Estimated pounds of fluid overload:  0   Is Subject experiencing Shortness of Breath?  _17  Yes   _18   No  Dyspnea VAS: (10 being the worst)   _19  1 _20  2 _21  3 _22  4 _23  5 _24  6 _25  7 _26 8 _27  9 _28  10  Is Subject experiencing Orthopnea?  _29  Yes    _30  No  Is there evidence of Rales?  _31  Yes  _32   No    If Yes, Characterize:   _33  Rhonchi     _34  Crackles     _35  Wheezing     _36  Stridor Ascites? _37   Yes  _38   No  Leg Edema?  _39  Yes    _40  No         If yes, grade of edema:  _41  1+  _42  2+  _43  3+  _44  4+  Sacral Edema?  _45  Yes   _46  No        If yes, grade of edema:    _47  1+  _48  2+  _49  3+  _50  4+  Jugular Vein Distention (JVD)?:  _51  Yes  _52   No        If yes, provide  measurement:   __5__ cm  NYHA Classification:  _53  I   _54  II  _55  III  _56   IV  _57   N/A  Has subject expressed presence of thirst?  _58  Yes  _59   No       If Yes, rate of thirst level (10 being the worst): _60  1 _61  2 _62  3 _63  4 _64  5 _65  6 _66  7 _67 8 _68  9 _69  10   ++++  PROVIDER NOTES OR COMMENTS:          Length of Stay: [place], MD  06/24/2021, 8:55 AM  Advanced Heart Failure Team Pager [phone] (M-F; 7a - 5p)  Please contact [facility] for night-coverage after hours (5p -7a ) and weekends on [email or web address]

## 2021-06-24 NOTE — Progress Notes (Signed)
?Heart and Vascular Care Navigation ? ?06/24/2021 ? ?Kevin Cardenas ?04-22-1975 ?[id number] ? ?Reason for Referral: Paramedicine referral ?  ?Engaged with patient face to face for initial visit for Heart and Vascular Care Coordination. ?                                                                                                  ?Assessment:       CSW spoke with pt about enrollment in [employer].  Pt has been part of program before and is agreeable to re- enrollment post hospital stay. ? ?Paramedicine Initial Assessment: ? ?Housing:  ?In what kind of housing do you live? House/apt/trailer/shelter? house ? ?Do you rent/pay a mortgage/own? rents ? ?Do you live with anyone? Wife and brother in law ? ?Social:  ?What is your current marital status? married ? ?Do you have any children? Not assessed ? ?Do you have family or friends who live locally? Reports lots of family locally but main support is from wife and brother in law ? ?Income:  ?What is your current source of income? SSDI- about $975/month ? ?Insurance:  ?Are you currently insured? Yes- gets Medicaid ? ?Do you have prescription coverage? yes ? ?Transportation:  ?Do you have transportation to your medical appointments? Yes- states wife drives and takes him to appt- also informed pt of Medicaid transportation benefit to get rides to medical appts. ? ? ? Daily Health Needs: ?Do you have a working scale at home? yes ? ?How do you manage your medications at home? Takes them out of the bottle- feels as if he has a good handle on this. ? ?Do you ever take your medications differently than prescribed? Reports he takes as prescribed but providers worried about compliance. ? ?Do you have issues affording your medications? no ? ?Do you have any concerns with mobility at home? Reports he is able to completed ADLs safely ? ?Do you use any assistive devices at home or have PCS at home? no ? ?Do you have a PCP? Yes- last seen in January  ? ?Do you have  any trouble reading or writing? no ? ?Are there any additional barriers you see to getting the care you need? None reported ?     ? ?HRT/VAS Care Coordination   ? ? Outpatient Care Team [facility] Paramedicine; Social Worker  ? Social Worker Name: Tammy Sours [facility] [phone]  ? Living arrangements for the past 2 months Single Family Home  ? Lives with: Spouse; Relatives  ? Patient Current Insurance Coverage Medicaid  ? Patient Has Concern With Paying Medical Bills No  ? Does Patient Have Prescription Coverage? Yes  ? Home Assistive Devices/Equipment Eyeglasses; Blood pressure cuff; CPAP  ? ?  ? ? ?Social History:                                                                             ?  SDOH Screenings  ? ?Alcohol Screen: Not on file  ?Depression (PHQ2-9): Low Risk   ? PHQ-2 Score: 0  ?Financial Resource Strain: Not on file  ?Food Insecurity: No Food Insecurity  ? Worried About Charity fundraiser in the Last Year: Never true  ? Ran Out of Food in the Last Year: Never true  ?Housing: Low Risk   ? Last Housing Risk Score: 0  ?Physical Activity: Not on file  ?Social Connections: Not on file  ?Stress: Not on file  ?Tobacco Use: Medium Risk  ? Smoking Tobacco Use: Former  ? Smokeless Tobacco Use: Never  ? Passive Exposure: Not on file  ?Transportation Needs: No Transportation Needs  ? Lack of Transportation (Medical): No  ? Lack of Transportation (Non-Medical): No  ? ? ?SDOH Interventions: ?Financial Resources:   Receives approximately $975/month in SSDI  ?Food Insecurity:  Food Insecurity Interventions: Intervention Not Indicated- receives food stamps  ?Housing Insecurity:  Housing Interventions: Intervention Not Indicated  ?Transportation:   Transportation Interventions: Other (Comment) (Referred to Medicaid transportation)  ? ? ? ?Follow-up plan:   ? ? ?CSW sending out referral to paramedics for assignment- they will follow up with pt regarding initial visit ? ?Jorge Ny, LCSW ?Clinical  Social Worker ?[facility] Failure Clinic ?Desk#: [phone] ?Cell#: [phone] ? ? ?

## 2021-06-24 NOTE — Progress Notes (Signed)
Fastr patient 007  ?Please review the following labs  ?Any abnormal labs clinically significant?

## 2021-06-24 NOTE — Progress Notes (Signed)
PT Cancellation Note ? ?Patient Details ?Name: Kevin Cardenas ?MRN: [id number] ?DOB: 1975-06-12 ? ? ?Cancelled Treatment:    Reason Eval/Treat Not Completed: Patient at procedure or test/unavailable (cath) ? ?Wyona Almas, PT, DPT ?Acute Rehabilitation Services ?Pager [phone] ?Office [phone] ? ? ? ?Kevin Cardenas ?06/24/2021, 7:48 AM ? ? ?

## 2021-06-24 NOTE — Interval H&P Note (Signed)
History and Physical Interval Note: ? ?06/24/2021 ?7:58 AM ? ?Kevin Cardenas  has presented today for surgery, with the diagnosis of heart failure.  The various methods of treatment have been discussed with the patient and family. After consideration of risks, benefits and other options for treatment, the patient has consented to  Procedure(s): ?RIGHT/LEFT HEART CATH AND CORONARY ANGIOGRAPHY (N/A) as a surgical intervention.  The patient's history has been reviewed, patient examined, no change in status, stable for surgery.  I have reviewed the patient's chart and labs.  Questions were answered to the patient's satisfaction.   ? ? ?Kevin Cardenas Aundra Dubin ? ? ?

## 2021-06-24 NOTE — Progress Notes (Addendum)
Occupational Therapy Treatment ?Patient Details ?Name: Kevin Cardenas ?MRN: [id number] ?DOB: May 31, 1975 ?Today's Date: 06/24/2021 ? ? ?History of present illness Pt is 46 yo male who was admitted 06/19/21 for acute hypoxic respiratory failure in setting of acute on chronic systolic heart failure and AKI. PMH includes VSD repair and removal of a pulmonary artery band at age 51, DM, HTN, systolic heart failure, NICM and OSA w/ poor compliance w/ CPAP. ?  ?OT comments ? Pt progressing towards acute OT goals. Pt removed [place] prior to OOB activity. SpO2 while walking 91-93, 87-89 at rest sitting EOB. Nursing notified. D/c recommendation remains appropriate.   ? ?Recommendations for follow up therapy are one component of a multi-disciplinary discharge planning process, led by the attending physician.  Recommendations may be updated based on patient status, additional functional criteria and insurance authorization. ?   ?Follow Up Recommendations ? Home health OT  ?  ?Assistance Recommended at Discharge Set up Supervision/Assistance  ?Patient can return home with the following ? Assistance with cooking/housework ?  ?Equipment Recommendations ? BSC/3in1  ?  ?Recommendations for Other Services   ? ?  ?Precautions / Restrictions Precautions ?Precautions: Fall, s/p RIGHT/LEFT HEART CATH AND CORONARY ANGIOGRAPHY  this morning currently with RUE restrictions ?Precaution Comments: monitor SpO2 ?Restrictions ?Weight Bearing Restrictions: No  ? ? ?  ? ?Mobility Bed Mobility ?  ?  ?  ?  ?  ?  ?  ?General bed mobility comments: EOB at start and end of session ?  ? ?Transfers ?Overall transfer level: Needs assistance ?Equipment used: None ?  ?Sit to Stand: Min guard, Supervision ?  ?  ?  ?  ?  ?General transfer comment: to/from EOB ?  ?  ?Balance Overall balance assessment: Mild deficits observed, not formally tested ?  ?  ?  ?  ?  ?  ?  ?  ?  ?  ?  ?  ?  ?  ?  ?  ?  ?  ?   ? ?ADL either performed or assessed with clinical judgement  ? ?ADL  Overall ADL's : Needs assistance/impaired ?  ?  ?  ?  ?  ?  ?  ?  ?  ?  ?  ?  ?Toilet Transfer: Min guard;Supervision/safety ?  ?  ?  ?  ?  ?Functional mobility during ADLs: Supervision/safety;Min guard ?General ADL Comments: Completed bathroon distance functinoal mobility 2x. ?  ? ?Extremity/Trunk Assessment Upper Extremity Assessment ?Upper Extremity Assessment: RUE deficits/detail ?RUE Deficits / Details: heart cath this am. currently under RUE restrictions. ?  ?Lower Extremity Assessment ?Lower Extremity Assessment: Defer to PT evaluation ?  ?  ?  ? ?Vision   ?  ?  ?Perception   ?  ?Praxis   ?  ? ?Cognition Arousal/Alertness: Awake/alert ?Behavior During Therapy: [facility] for tasks assessed/performed ?Overall Cognitive Status: Within Functional Limits for tasks assessed ?  ?  ?  ?  ?  ?  ?  ?  ?  ?  ?  ?  ?  ?  ?  ?  ?  ?  ?  ?   ?Exercises   ? ?  ?Shoulder Instructions   ? ? ?  ?General Comments Pt removed [place] prior to walking. SpO2 while walking 91-93, 87-89 at rest sitting EOB  ? ? ?Pertinent Vitals/ Pain       Pain Assessment ?Pain Assessment: Faces ?Faces Pain Scale: Hurts a little bit ?Pain Location: R hip with mobility ?Pain  Descriptors / Indicators: Grimacing, Guarding ?Pain Intervention(s): Monitored during session ? ?Home Living   ?  ?  ?  ?  ?  ?  ?  ?  ?  ?  ?  ?  ?  ?  ?  ?  ?  ?  ? ?  ?Prior Functioning/Environment    ?  ?  ?  ?   ? ?Frequency ? Min 2X/week  ? ? ? ? ?  ?Progress Toward Goals ? ?OT Goals(current goals can now be found in the care plan section) ? Progress towards OT goals: Progressing toward goals ? ?Acute Rehab OT Goals ?Patient Stated Goal: home ?OT Goal Formulation: With patient ?Time For Goal Achievement: 07/04/21 ?Potential to Achieve Goals: Good ?ADL Goals ?Pt Will Perform Grooming: with modified independence;standing ?Pt Will Perform Lower Body Bathing: with modified independence;sit to/from stand ?Pt Will Perform Lower Body Dressing: with modified independence;sit to/from stand ?Pt  Will Transfer to Toilet: with modified independence;ambulating ?Pt Will Perform Tub/Shower Transfer: with modified independence;ambulating  ?Plan Discharge plan remains appropriate   ? ?Co-evaluation ? ? ?   ?  ?  ?  ?  ? ?  ?AM-PAC OT "6 Clicks" Daily Activity     ?Outcome Measure ? ? Help from another person eating meals?: None ?Help from another person taking care of personal grooming?: None ?Help from another person toileting, which includes using toliet, bedpan, or urinal?: A Little ?Help from another person bathing (including washing, rinsing, drying)?: A Little ?Help from another person to put on and taking off regular upper body clothing?: None ?Help from another person to put on and taking off regular lower body clothing?: A Little ?6 Click Score: 21 ? ?  ?End of Session   ? ?OT Visit Diagnosis: Unsteadiness on feet (R26.81);Muscle weakness (generalized) (M62.81);Other (comment) ?  ?Activity Tolerance Patient tolerated treatment well ?  ?Patient Left in bed;with call bell/phone within reach;Other (comment) ?  ?Nurse Communication Other (comment) (SpO2 during session) ?  ? ?   ? ?Time: 6384-6659 ?OT Time Calculation (min): 13 min ? ?Charges: OT General Charges ?$OT Visit: 1 Visit ?OT Treatments ?$Self Care/Home Management : 8-22 mins ? ?Tyrone Schimke, OT ?Acute Rehabilitation Services ?Office: [phone] ? ? ?Tyrone Schimke H ?06/24/2021, 2:58 PM ?

## 2021-06-24 NOTE — Progress Notes (Signed)
Patient provided with verbal discharge instructions. Paper copy of discharge provided to patient. RN answered all questions. VSS at discharge. IV removed. PICC removed.  Patient belongings sent with patient. Patient dc'd via wheelchair by NT to [employer] to private vehicle. ?

## 2021-06-25 ENCOUNTER — Telehealth: Payer: Self-pay

## 2021-06-25 LAB — CULTURE, BLOOD (ROUTINE X 2)
Culture: NO GROWTH
Special Requests: ADEQUATE

## 2021-06-25 MED FILL — Heparin Sod (Porcine)-NaCl IV Soln 1000 Unit/500ML-0.9%: INTRAVENOUS | Qty: 500 | Status: AC

## 2021-06-25 NOTE — Telephone Encounter (Signed)
Transition Care Management Follow-up Telephone Call ?Date of discharge and from where: 06/24/2021, [facility] ?How have you been since you were released from the hospital? He stated that he is feeling fine.  ?Any questions or concerns? No ? ?Items Reviewed: ?Did the pt receive and understand the discharge instructions provided? Yes  ?Medications obtained and verified? Yes  -he said he has all medications and did not have any questions about the med regime. He said that the instructions were reviewed in detail prior to leaving the hospital.  ?Other? No  ?Any new allergies since your discharge? No  ?Dietary orders reviewed? Yes - he said he was also instructed to adhere to a fluid restriction of < 2 L.  ?Do you have support at home? Yes - his wife.  ? ?Home Care and Equipment/Supplies: ?Were home health services ordered? no ?If so, what is the name of the agency? N/a  ?Has the agency set up a time to come to the patient's home? not applicable ?Were any new equipment or medical supplies ordered?  No ?What is the name of the medical supply agency? N/a ?Were you able to get the supplies/equipment? not applicable ?Do you have any questions related to the use of the equipment or supplies? No ? ?He is being referred to the [employer].  ?He stated that he has a scale at home and weighs himself daily and logs the weights.  ? ? ?Functional Questionnaire: (I = Independent and D = Dependent) ?ADLs: independent ? ? ?Follow up appointments reviewed: ? ?PCP Hospital f/u appt confirmed?  He said he will call to schedule an appointment. ?[facility] f/u appt confirmed? Yes  Scheduled to see Cardiology - 06/28/2021. ?Are transportation arrangements needed? No  - he said his wife drives.  He also stated that he lost the number to call DSS for rides to medical appointments. , so he was provided that number again. ?If their condition worsens, is the pt aware to call PCP or go to the Emergency Dept.?  Yes ?Was the patient provided with contact information for the PCP's office or ED? Yes ?Was to pt encouraged to call back with questions or concerns? Yes ? ?

## 2021-06-26 LAB — SODIUM, URINE, 24 HOUR: Sodium, Ur: 121 mmol/L

## 2021-06-26 LAB — CHLORIDE, URINE, TIMED: Chloride, Ur: 101 mmol/L

## 2021-06-26 LAB — CREATININE, URINE, 24 HOUR: Creatinine, Urine: 13.1 mg/dL

## 2021-06-26 LAB — SPECIFIC GRAVITY, URINE: Specific Gravity, UA: 1.008 (ref 1.005–1.030)

## 2021-06-26 LAB — POTASSIUM, URINE, 24 HOUR: Potassium Urine: 10.1 mmol/L

## 2021-06-26 LAB — OSMOLALITY, URINE: Osmolality, Ur: 308 mOsmol/kg

## 2021-06-26 NOTE — Research (Signed)
?DISCHARGE ? ?Date / time of DISCHARGE:   Date /  Time:  _13__ / _MAR___/ _2023___   at _17__:__30__      _0   Time unknown ?       DD/ MMM/YYYY HH : MM  ? ?Days in ICU / Telemetry: _5 ? ?Subject discharged to :  _1   Extended Care Facility ?    _2   Rehabilitation Facility ?    _3   Home ?    _4   Other: Specify:____ ? ?Discharge Diuretic:  ?    Type: _Torsemide____ ?    Dose: __20___  mg ? Frequency: _BID___ ? ?Were there any changes in Subject's medications?  _5   YES  _6   NO ?   (if Yes, update concomitant medications form)   ?   ?Did the Subject experience any new adverse events? _7  YES  _8  NO ?  (if Yes, update adverse events form) ?  ?Was AUDITORY TESTING performed?  _9  YES  _10  NO ?  if NO, provide a reason: ________ ? ?Treatment Assessment: (24 hours after Treatment initiation or Therapy End IF < 24 hours) ? ?Date / time of Assessment: Date / Time:  _11__ / _MAR___/ _2023___   at _07__:_20___       ? ? ? ?Diuretic Administration:  _11  Continuous    _12   Bolus      _13   Both ?  (Update Diuretic Log)       ? ?Total infused (diuretic): __1196____ mg ? ?Total Urine: __23833______ ml ? ?Urine Production Rate: __105____ ml/hr ? ?Total saline infused: _9715_____ ml ? ?Pump infusion rate: __4___ ml/hr ?FLUID BALANCE:  ? ?Date/Time of Assessment _13__/_MAR___/_2023___   at 8546 (DD/MMM/YYYY)   ?  ? ?Input ?DMS total Infusion _9715_____ mL _14  NA ?Other IV Fluid Input __89_____ mL _15  NA ?Oral Fluid Input __3960______ ML _16  NA ?Total Input _13764______ mL ? ? ?Output ?Total Urine _33700_______ mL _17  NA ?Missed Urine ________ mL _18  NA ?Other Fluid Output _______ mL _19  NA ?Total Output __33700_____ mL ? ? ? ?WEIGHT ASSESSMENT ? ? ?Exam Date / Time:    _13__/_MAR___/_2023___   (DD/MMM/YYYY)   ? ?        _11____: __33____ (HH:MM)     or    _20   Time Unknown ? ?Was standing weight Measured? _21  Yes  _22   No  ?   ?        If Yes, Weight:  233.2          lbs ? ?        If No, provide rationale: ____________ ? ?Was the assessment  completed using the Sponsored provided weight scale?  _23  Yes  _24   No ? ?       If NO, what scale was used for assessment? ________ ? ?Was URINE/Blood sample collected and processed for Site LAB?      ?                 _25  YES     _26  NO     _27  Not required per protocol ?Collection Date / Time  :    _13__/_MAR___/_2023___     _11__:_49___     _28   Time Unknown ?           (DD/MMM/YYYY)  (HH:MM) ? ?Was URINE/BLOOD sample collected and processed for CORE LAB?      ?                 _29   YES     _0  NO     _1  Not required per protocol ? ?Collection Date / Time  :    _13__/_MAR___/_2023___     _1400_     _2   Time Unknown ?           (DD/MMM/YYYY)  (HH:MM) ? ?Date/Time of Assessment Date/Time of Assessment _13__/_MAR___/_2023___   (DD/MMM/YYYY)  ? ? ?Please indicate if the following have occurred since the last assessment of this patient: ?(Only applicable from 24M from Init visit up to Discharge visit) ? ?  ?Acute kidney injury, defined as use of renal replacement therapy (RRT). RRT includes hemodialysis, hemofiltration, hemodiafiltration, peritoneal dialysis, and kidney transplant. Isolated ultrafiltration without dialysis will not be considered RRT. *Note- acute kidney injury defined as KDIGO stage 2 or greateris captured in the Serum Chemistry labs CRF  _3   Yes or _4  No ? ?Symptomatic hypotension, defined as sustained hypotension (<62 mmHg systolic) with corresponding symptoms that require intervention(i.e., fluid bolus or vasoactive medication) _5   Yes or _6  No ?  ?  ?Hypertensive emergency, defined as blood pressure greater than 180/120 mmHg with end-organ damage (cardiovascular, cerebrovascularor renal)  _7   Yes or _8  No ? ?Form is based on Page 31-32 of FASTR Trial  eCRFs    Protocol RCV-0006 ? ?Auditory Testing Completed: _13__/_MAR___/_2023___     _11__:_20___     _9   Time Unknown ?                 (DD/MMM/YYYY)  (HH:MM) ? ? ? ?DIURETIC LOG:  ? ? Diuretic Name    Start Date / Time  End Date/ Time    (not for  oral med)     Dose amount ? ?      Dose  ?    Unit   Frequency   Total amount given in this stay   Comments   ?Furosemide 09/MAR/2023 - 0909 11/MAR/2023 - 0720 4 Ml/hr continuous 1196 DMS   ?Torsemide 13/MAR/2023 0055 No END 20 mg BID 20 ORAL  ?         ?         ?         ? ? ? ?Form is based on Page 41  of FASTR Trial  eCRFs    Protocol RCV-0006  ?

## 2021-06-26 NOTE — Research (Signed)
60 Hours Post Treatment Initiation ?SITE #   [id number]            SUBJECT #:  07          SUBJECT INITIALS: BNW ? ?Treatment Assessment: (60 hours after Treatment initiation or Therapy End IF < 60 hours) ? ?FLUID BALANCE:  ? ?Date/Time of Assessment _11__/_MAR___/2023____   (DD/MMM/YYYY)   ? ?   ? ?Input ?DMS total Infusion __9715____ mL _0  NA ?Other IV Fluid Input _______ mL _1  NA ?Oral Fluid Input __2640______ ML _2  NA ?Total Input _12355______ mL ? ? ?Output ?Total Urine _28800_______ mL _3  NA ?Missed Urine ________ mL _4  NA ?Other Fluid Output _______ mL _5  NA ?Total Output __28800_____ mL ? ?Was Blood sample collected and processed for Site LAB?      ?                 _6  YES     _7  NO     _8  Not required per protocol ?Collection Date / Time  :    _11__/_MAR___/_2023___     _21__:_30___     _9   Time Unknown ?           (DD/MMM/YYYY)  (HH:MM) ? ?Date/Time of Assessment Date/Time of Assessment _11__/_MAR___/__2023__   (DD/MMM/YYYY)  ? ? ?Please indicate if the following have occurred since the last assessment of this patient: ?(Only applicable from 07M from Init visit up to Discharge visit) ? ?  ?Acute kidney injury, defined as use of renal replacement therapy (RRT). RRT includes hemodialysis, hemofiltration, hemodiafiltration, peritoneal dialysis, and kidney transplant. Isolated ultrafiltration without dialysis will not be considered RRT. *Note- acute kidney injury defined as KDIGO stage 2 or greateris captured in the Serum Chemistry labs CRF  _10   Yes or _11  No ? ?Symptomatic hypotension, defined as sustained hypotension (<22 mmHg systolic) with corresponding symptoms that require intervention(i.e., fluid bolus or vasoactive medication) _12   Yes or _13  No ?  ?  ?Hypertensive emergency, defined as blood pressure greater than 180/120 mmHg with end-organ damage (cardiovascular, cerebrovascularor renal)  _14   Yes or _15  No  ? ? ?

## 2021-06-26 NOTE — Research (Signed)
48 Hours Post Treatment Initiation ?SITE #   [id number]            SUBJECT #:   07         SUBJECT INITIALS: BNW ? ?Treatment Assessment: (72 hours after Treatment initiation or Therapy End IF < 48 hours) ? ?Date / time of Assessment: Date / Time:  _12__ / _MAR___/ _2023___   at _07__:_20___       ? ? ?Diuretic Administration:  _0  Continuous    _1   Bolus      _2   Both ?  (Update Diuretic Log)       ? ?Total infused (diuretic): __1196____ mg ? ?Total Urine: __23833______ ml ? ?Urine Production Rate: _105_____ ml/hr ? ?Total saline infused: _9715_____ ml ? ?Pump infusion rate: __4___ ml/hr ? ?FLUID BALANCE:  ? ?Date/Time of Assessment _12__/_MAR___/_2023___   at 0900 ?   ? ?Input ?DMS total Infusion _9715_____ mL _3  NA ?Other IV Fluid Input _______ mL _4  NA ?Oral Fluid Input _3000_______ ML _5  NA ?Total Input __12715_____ mL ? ? ?Output ?Total Urine __29500______ mL _6  NA ?Missed Urine ________ mL _7  NA ?Other Fluid Output _______ mL _8  NA ?Total Output __29500_____ mL ? ? ?WEIGHT ASSESSMENT ? ? ?Exam Date / Time:    _12__/_MAR___/_2023___   (DD/MMM/YYYY)   ? ?               _15____: _02_____ (HH:MM)     or    _9   Time Unknown ? ?Was standing weight Measured? _10  Yes  _11   No  ?   ?        If Yes, Weight:  234         lbs ? ?        If No, provide rationale: ____________ ? ?Was the assessment completed using the Sponsored provided weight scale?  _12  Yes  _13   No ? ?       If NO, what scale was used for assessment? ________ ? ? ?Was Blood sample collected and processed for Site LAB?      ?                 _14  YES     _15  NO     _16  Not required per protocol ?Collection Date / Time  :    _12__/_MAR___/_2023___     _05__:__00__     _17   Time Unknown ?           (DD/MMM/YYYY)  (HH:MM) ? ?Was BLOOD sample collected and processed for CORE LAB?      ?                 _18  YES     _19  NO     _20  Not required per protocol ?Collection Date / Time  :    _12__/_MAR___/_2023___     _14__:__56__     _21   Time Unknown ?           (DD/MMM/YYYY)   (HH:MM) ? ?Was URINE sample collected and processed for CORE LAB?      ?                 _22  YES     _23  NO     _24  Not required per protocol ?Collection Date / Time  :    _12__/_MAR___/_2023___     _15__:_05___     _25   Time Unknown ?           (  DD/MMM/YYYY)  (HH:MM) ? ? ?Date/Time of Assessment Date/Time of Assessment _12__/_MAR___/_2023___   (DD/MMM/YYYY)  ? ? ?Please indicate if the following have occurred since the last assessment of this patient: ?(Only applicable from 69S from Init visit up to Discharge visit) ? ?  ?Acute kidney injury, defined as use of renal replacement therapy (RRT). RRT includes hemodialysis, hemofiltration, hemodiafiltration, peritoneal dialysis, and kidney transplant. Isolated ultrafiltration without dialysis will not be considered RRT. *Note- acute kidney injury defined as KDIGO stage 2 or greateris captured in the Serum Chemistry labs CRF  _0   Yes or _1  No ? ?Symptomatic hypotension, defined as sustained hypotension (<85 mmHg systolic) with corresponding symptoms that require intervention(i.e., fluid bolus or vasoactive medication) _2   Yes or _3  No ?  ?  ?Hypertensive emergency, defined as blood pressure greater than 180/120 mmHg with end-organ damage (cardiovascular, cerebrovascularor renal)  _4   Yes or _5  No ? ? ?Form is based on Page 13 of FASTR Trial  eCRFs    Protocol RCV-0006  ?

## 2021-06-27 ENCOUNTER — Telehealth (HOSPITAL_COMMUNITY): Payer: Self-pay

## 2021-06-27 NOTE — Telephone Encounter (Signed)
Pt was referred to [facility] program, his paramedic will be heather, since she is not here this week, I did reach out to him to let him know he would be contacted next week for a home visit.  ?He reported feeling ok, no major issues or concerns presently and he reported having all of his meds.  ? ?Marylouise Stacks, EMT-Paramedic  ?06/27/21 ? ?

## 2021-06-28 ENCOUNTER — Encounter (HOSPITAL_COMMUNITY): Payer: Medicaid Other | Admitting: Cardiology

## 2021-07-01 ENCOUNTER — Telehealth (HOSPITAL_COMMUNITY): Payer: Self-pay

## 2021-07-01 NOTE — Telephone Encounter (Signed)
Called and left patient a detailed message to confirm/remind patient of their appointment at the [facility] on 07/02/21.  ? ?I also left a message reminding patient to bring all medications and/or complete list and to give our office a call back at [phone]  with any concerns and press option 2. ? ? ? ? ?

## 2021-07-01 NOTE — Progress Notes (Incomplete)
?  ?  ? ?ID:  Kevin Cardenas, DOB 12-Oct-1975, MRN [id number]   ?Provider location: [facility] ?Type of Visit: Established patient ? ?PCP:  [name], MD  ?Cardiologist:  [name] ?  ?History of Present Illness: ?Kevin Cardenas is a 46 y.o. male who has a history of VSD repair and removal of a pulmonary artery band at age 12, DM, HTN, systolic heart failure, NICM and OSA diagnosed in past but not on CPAP. Has St Jude ICD (placed 01/2015).  ? ?Echo in 3/20 showed EF 25%, mild LV dilation, mildly decreased RV systolic function.  ? ?CPX in 10/20 showed moderate functional limitation primarily due to restrictive lung disease.  I had planned to get PFTs and a high resolution CT chest to followup on lung restriction but was never scheduled.  ? ?At last appointment, he had been off his meds for several months but supposedly had restarted them.  Weight was up 20 lbs and he was volume overloaded.  I stopped Lasix and started torsemide, kept other meds as ordered.  He came to the ER later in 7/21 with a bleeding varicose vein but was noted to be hypotensive with AKI.  He was thought to be over-diuresed.  Torsemide was cut back and Entresto was decreased.  ? ?Echo in 8/21 showed EF 30-35%, no significant residual VSD, normal RV.  ?  ?Admitted 3/23 with a/c respiratory failure with hypoxia secondary to a/c CHF and noncompliance with CPAP for OSA/OHS. PICC line placed. Co-ox okay. He was enrolled in FASTR trial for diuresis. Diuresed 24 lb. FASTR trial discontinued on 03/11. Transitioned to torsemide 20 mg bid (prior home dose). GDMT titrated.  R/LHC on 03/13 with normal coronaries, normal filling pressures and normal CO. Mild pulmonary HTN. Referred to paramedicine. HH PT/OT recommended. Patient declined [place]. ? ? ?ECG (personally reviewed): NSR, IVCD (126 msec) ? ?Labs (1/16): K 4.1, creatinine 0.9, TSH normal, LDL 89, HDL 46 ?Labs (05/24/2014): K 4.1 Creatinine 0.97  ?Labs (3/16): BNP 54,  K 4.4, creatinine 1.0 ?Labs 08/14/2014: K 4.0 Creatinine 0.93  ?Labs (6/16): digoxin < 0.2 ?Labs (9/16): K 3.7, creatinine 0.93, digoxin < 0.5, HCT 40.4 ?Labs (01/2016): dig 0.5 K 4.1 Creatinine 0.94  ?Labs (09/2017): K 4.3 Creatinine 0.95 ?Labs (3/20): K 4.3, creatinine 1.02, digoxin < 0.2, LDL 79 ?Labs (9/20): K 3.8, creatinine 0.82, digoxin < 0.2 ?Labs (7/21): K 3.8, creatinine 1.0 => 2.34 => 1.08, BNP 81, TSH normal ?Labs (9/21): digoxin 0.5, K 4.3, creatinine 1.12 ?Labs (5/22): digoxin 0.2, K 4, creatinine 1.06 ?   ?PMH: ?1. VSD repair at 1.  ?2. Type 2 diabetes. ?3. HTN ?4. OSA: Not using CPAP.  ?5. Chronic systolic CHF: Nonischemic cardiomyopathy.  St Jude ICD.  ?- Echo 2009: EF 35-40% ?- [place] 2009: No angiographic coronary disease.  ?- Echo 9/11: LVEF 20-25%  ?- Echo 11/12: LVEF 30-35%, diffuse hypokinesis and mild LVH. RV systolic mildly reduced. LA mildly dilated. Pulmonic valve with mild stenosis.  ?- Echo 05/12/2014: EF 15-20% Grade IDD, LV moderately dilated.  RV normal. Mild pulmonc stenosis mean peak gradient 14.  ?- Echo 7/16: EF 25-30%, mild LV dilation, mild LVH, trivial pulmonic stenosis with peak gradient 11 mmHg, mildly decreased RV function with mild RV dilation.   ?- CPX (3/16): RER 1.03, peak VO2 19.9, VE/VCO2 slope 18, mild to moderately impaired functional capacity.  There was moderate to severe mixed restrictive/obstructive pattern on spirometry => exertional hypoxemia, oxygen  saturation decreased to 84%.  ?- RHC (4/16): mean RA 9, PA 35/12, mean PCWP 8, CI 1.8 Fick, CI 2.4 thermo.  ?- Echo 02/2106: EF 30-35% Grade IIDD  ?- Echo 3/20: EF 25%, mild LV dilation, diffuse hypokinesis, mildly decreased RV systolic function with mild RV dilation, no definite PS noted.  ?- CPX (10/20): VE/VCO2 16, peak VO2 15.8 (57% predicted), RER 1.08.  Moderate functional limitation primarily due to restrictive lung disease.  Severe restriction on PFTs.  ?- Echo (8/21): EF 30-35%, no significant residual VSD,  normal RV.  ?6. Pulmonic stenosis: Mild in past, not significant on 2/20 echo.  ?7. High resolution CT chest in 8/21: No evidence for ILD.  ? ?Current Outpatient Medications  ?Medication Sig Dispense Refill  ? ascorbic acid (VITAMIN C) 500 MG tablet Take 500-1,000 mg by mouth daily.    ? aspirin 81 MG chewable tablet Chew 1 tablet (81 mg total) by mouth daily. 30 tablet 0  ? carvedilol (COREG) 3.125 MG tablet Take 1 tablet (3.125 mg total) by mouth 2 (two) times daily with a meal. 60 tablet 1  ? dapagliflozin propanediol (FARXIGA) 10 MG TABS tablet Take 1 tablet (10 mg total) by mouth daily. 30 tablet 1  ? digoxin (LANOXIN) 0.125 MG tablet Take 1 tablet (125 mcg total) by mouth daily. 90 tablet 2  ? fluticasone furoate-vilanterol (BREO ELLIPTA) 200-25 MCG/INH AEPB Inhale 1 puff into the lungs daily. 30 each 11  ? potassium chloride SA (KLOR-CON) 20 MEQ tablet Take 1 tablet (20 mEq total) by mouth daily. 90 tablet 2  ? sacubitril-valsartan (ENTRESTO) 24-26 MG Take 1 tablet by mouth 2 (two) times daily. 60 tablet 1  ? simvastatin (ZOCOR) 20 MG tablet Take 1 tablet (20 mg total) by mouth at bedtime. 90 tablet 3  ? spironolactone (ALDACTONE) 25 MG tablet Take 1 tablet (25 mg total) by mouth daily. 30 tablet 1  ? torsemide (DEMADEX) 20 MG tablet Take 1 tablet (20 mg total) by mouth 2 (two) times daily. 60 tablet 1  ? ?No current facility-administered medications for this visit.  ? ? ?Allergies:   Metolazone  ? ?Social History:  The patient  reports that he quit smoking about 10 years ago. His smoking use included cigarettes. He has a 4.50 pack-year smoking history. He has never used smokeless tobacco. He reports that he does not drink alcohol and does not use drugs.  ? ?Family History:  The patient's family history includes Diabetes in his maternal grandfather and paternal grandmother; Lung cancer in his father; Stroke in his father and mother.  ? ?ROS:  Please see the history of present illness.   All other systems are  personally reviewed and negative.  ? ?Exam:   ?There were no vitals taken for this visit. ?General: NAD, obese.  ?Neck: No JVD, no thyromegaly or thyroid nodule.  ?Lungs: Clear to auscultation bilaterally with normal respiratory effort. ?CV: Nondisplaced PMI.  Heart regular S1/S2, no S3/S4, 1/6 SEM RUSB.  No peripheral edema.  No carotid bruit.  Normal pedal pulses.  ?Abdomen: Soft, nontender, no hepatosplenomegaly, no distention.  ?Skin: Intact without lesions or rashes.  ?Neurologic: Alert and oriented x 3.  ?Psych: Normal affect. ?Extremities: No clubbing or cyanosis.  ?HEENT: Normal.  ? ? ?Recent Labs: ?06/19/2021: B Natriuretic Peptide 235.5; TSH 0.984 ?06/24/2021: ALT 25; BUN 17; Creatinine, Ser 0.89; Hemoglobin 14.1; Magnesium 2.1; Platelets 137; Potassium 3.8; Sodium 135  ?Personally reviewed  ? ?Wt Readings from Last 3 Encounters:  ?06/24/21  106 kg (233 lb 11.2 oz)  ?04/17/21 110.5 kg (243 lb 9.6 oz)  ?12/18/20 108.7 kg (239 lb 9.6 oz)  ? ? ?ASSESSMENT AND PLAN: ?1. Acute on chronic systolic CHF: Nonischemic cardiomyopathy.  RHC in 4/16 showed preserved cardiac index by thermodilution but low by Fick.   Last echo in 8/21 showed EF 30-35%, normal RV, no residual VSD, no definite pulmonic stenosis.  He has a Research officer, political party ICD. He has an IVCD but QRS not long enough for CRT.  NYHA class IV at admission with hypoxemia.  BP has been soft in the hospital, not sure if he is actually compliant with his meds though he says that he is. Echo this admission with E 30-35%, no VSD noted, no significant pulmonary stenosis (peak gradient 6), moderate RV enlargement with normal systolic function.  Excellent diuresis with Reprieve device, now back on po torsemide.  RHC today with normal filling pressures and cardiac output.  Coronary angiography with no significant coronary disease.  ?- Continue torsemide 20 mg bid.   ?- Can transition off losartan and onto Entresto 24/26 bid.  ?- Restart Coreg 3.125 mg bid.  ?- Continue  spironolactone 25 daily.  ?- Continue digoxin 0.125.    ?- Continue Farxiga 10 mg daily.   ?- Probably not Cardiomems candidate due to Medicaid.  ?- Questionable medication compliance at home => will enlist paramedici

## 2021-07-02 ENCOUNTER — Encounter (HOSPITAL_COMMUNITY): Payer: Medicaid Other

## 2021-07-02 ENCOUNTER — Telehealth: Payer: Self-pay | Admitting: *Deleted

## 2021-07-02 DIAGNOSIS — Z006 Encounter for examination for normal comparison and control in clinical research program: Secondary | ICD-10-CM

## 2021-07-02 NOTE — Telephone Encounter (Signed)
Left message for patient about today's appointment with HF and Research. ?

## 2021-07-03 ENCOUNTER — Ambulatory Visit (HOSPITAL_COMMUNITY)
Admission: RE | Admit: 2021-07-03 | Discharge: 2021-07-03 | Disposition: A | Discharge status: Home / Self Care | Payer: 59 | Source: Ambulatory Visit | Attending: Cardiology | Admitting: Cardiology

## 2021-07-03 ENCOUNTER — Other Ambulatory Visit: Payer: Self-pay

## 2021-07-03 ENCOUNTER — Telehealth: Payer: Self-pay

## 2021-07-03 ENCOUNTER — Other Ambulatory Visit (HOSPITAL_COMMUNITY): Payer: Self-pay

## 2021-07-03 ENCOUNTER — Encounter: Payer: 59 | Admitting: *Deleted

## 2021-07-03 ENCOUNTER — Encounter (HOSPITAL_COMMUNITY): Payer: Self-pay | Admitting: Cardiology

## 2021-07-03 VITALS — BP 95/55 | HR 81 | Temp 98.3°F | Resp 18 | Wt 237.9 lb

## 2021-07-03 VITALS — BP 124/80 | HR 85 | Wt 241.4 lb

## 2021-07-03 DIAGNOSIS — E119 Type 2 diabetes mellitus without complications: Secondary | ICD-10-CM | POA: Insufficient documentation

## 2021-07-03 DIAGNOSIS — Q256 Stenosis of pulmonary artery: Secondary | ICD-10-CM | POA: Insufficient documentation

## 2021-07-03 DIAGNOSIS — Z95810 Presence of automatic (implantable) cardiac defibrillator: Secondary | ICD-10-CM | POA: Insufficient documentation

## 2021-07-03 DIAGNOSIS — I5020 Unspecified systolic (congestive) heart failure: Secondary | ICD-10-CM

## 2021-07-03 DIAGNOSIS — I428 Other cardiomyopathies: Secondary | ICD-10-CM | POA: Diagnosis not present

## 2021-07-03 DIAGNOSIS — Z8774 Personal history of (corrected) congenital malformations of heart and circulatory system: Secondary | ICD-10-CM | POA: Insufficient documentation

## 2021-07-03 DIAGNOSIS — Z7902 Long term (current) use of antithrombotics/antiplatelets: Secondary | ICD-10-CM | POA: Insufficient documentation

## 2021-07-03 DIAGNOSIS — I110 Hypertensive heart disease with heart failure: Secondary | ICD-10-CM | POA: Insufficient documentation

## 2021-07-03 DIAGNOSIS — Z7984 Long term (current) use of oral hypoglycemic drugs: Secondary | ICD-10-CM | POA: Diagnosis not present

## 2021-07-03 DIAGNOSIS — I5022 Chronic systolic (congestive) heart failure: Secondary | ICD-10-CM | POA: Insufficient documentation

## 2021-07-03 DIAGNOSIS — G4733 Obstructive sleep apnea (adult) (pediatric): Secondary | ICD-10-CM | POA: Insufficient documentation

## 2021-07-03 DIAGNOSIS — Z006 Encounter for examination for normal comparison and control in clinical research program: Secondary | ICD-10-CM

## 2021-07-03 DIAGNOSIS — Z79899 Other long term (current) drug therapy: Secondary | ICD-10-CM | POA: Insufficient documentation

## 2021-07-03 DIAGNOSIS — Q210 Ventricular septal defect: Secondary | ICD-10-CM | POA: Diagnosis not present

## 2021-07-03 LAB — COMPREHENSIVE METABOLIC PANEL
ALT: 38 U/L (ref 0–44)
AST: 23 U/L (ref 15–41)
Albumin: 3.9 g/dL (ref 3.5–5.0)
Alkaline Phosphatase: 54 U/L (ref 38–126)
Anion gap: 9 (ref 5–15)
BUN: 27 mg/dL — ABNORMAL HIGH (ref 6–20)
CO2: 33 mmol/L — ABNORMAL HIGH (ref 22–32)
Calcium: 9 mg/dL (ref 8.9–10.3)
Chloride: 97 mmol/L — ABNORMAL LOW (ref 98–111)
Creatinine, Ser: 1.23 mg/dL (ref 0.61–1.24)
GFR, Estimated: >60 mL/min (ref >60)
Glucose, Bld: 134 mg/dL — ABNORMAL HIGH (ref 70–99)
Potassium: 4.4 mmol/L (ref 3.5–5.1)
Sodium: 139 mmol/L (ref 135–145)
Total Bilirubin: 0.5 mg/dL (ref 0.3–1.2)
Total Protein: 7.2 g/dL (ref 6.5–8.1)

## 2021-07-03 MED ORDER — ENTRESTO 49-51 MG PO TABS
1.0000 | ORAL_TABLET | Freq: Two times a day (BID) | ORAL | 3 refills | Status: DC
Start: 2021-07-03 — End: 2023-04-23
  Filled 2021-07-03: qty 180, 90d supply, fill #0
  Filled 2021-11-04: qty 180, 90d supply, fill #1
  Filled 2022-03-04: qty 180, 90d supply, fill #2
  Filled 2022-06-16: qty 180, 90d supply, fill #3

## 2021-07-03 NOTE — Research (Signed)
FASTR Core Labs  ? ?Dr Aundra Dubin, Are there any labs that are Clinically Significant? ? ?Treatment Initial (06/20/2021) ? ? ?Labs within 24 Hours of Treatment Termination  (06/23/2021) ? ? ? ? ? ?Discharge Labs (06/24/2021) ? ? ? ?

## 2021-07-03 NOTE — Telephone Encounter (Signed)
Referred to [facility] clinic by Dr Aundra Dubin.   Spoke with patient and ICM intro given.  Patient is agreeable to ICM monthly follow up.  Advised transmission will send automatically between 12 Midnight and 6:00 AM if monitor is by bedside.  Explained will call with results after transmission is reviewed.  Provided ICM direct number and explained should call if experiencing any fluid symptoms such as weight gain, shortness of breath or extremity/abdominal swelling.  ?1st ICM remote transmission scheduled for 07/19/2021.  ?

## 2021-07-03 NOTE — Patient Instructions (Signed)
Medication Changes: ? ?Oneal Grout to 49/51 Twice daily ? ? ?Lab Work: ? ?Labs done today, your results will be available in MyChart, we will contact you for abnormal readings. ? ? ?Testing/Procedures: ? ?Repeat blood work in 10 days ? ?Referrals: ? ?You have been referred to the device Clinic. They will call you to arrange your appointment. ? ?Special Instructions // Education: ? ?none ? ?Follow-Up in: please keep up coming appointments ? ?At the [facility], you and your health needs are our priority. We have a designated team specialized in the treatment of Heart Failure. This Care Team includes your primary Heart Failure Specialized Cardiologist (physician), Advanced Practice Providers (APPs- Physician Assistants and Nurse Practitioners), and Pharmacist who all work together to provide you with the care you need, when you need it.  ? ?You may see any of the following providers on your designated Care Team at your next follow up: ? ?Dr Glori Bickers ?Dr [name] ?Darrick Grinder, NP ?Lyda Jester, PA ?Jessica Milford,NP ?Marlyce Huge, PA ?Audry Riles, PharmD ? ? ?Please be sure to bring in all your medications bottles to every appointment.  ? ?Need to Contact us: ? ?If you have any questions or concerns before your next appointment please send [place] a message through [place] or call our office at [phone].   ? ?TO LEAVE A MESSAGE FOR THE NURSE SELECT OPTION 2, PLEASE LEAVE A MESSAGE INCLUDING: ?YOUR NAME ?DATE OF BIRTH ?CALL BACK NUMBER ?REASON FOR CALL**this is important as we prioritize the call backs ? ?YOU WILL RECEIVE A CALL BACK THE SAME DAY AS LONG AS YOU CALL BEFORE 4:00 PM ? ? ?

## 2021-07-03 NOTE — Telephone Encounter (Signed)
-----  Message from Jerl Mina, RN sent at 07/03/2021 10:08 AM EDT ----- ?[name] would like to get him enrolled with you. He has a St.Jude  ? ?

## 2021-07-03 NOTE — Research (Signed)
? ?  SITE #  [id number]         SUBJECT #      [id number]      SUBJECT Initials: BNW  ? ?7-DAY FOLLOW UP VISIT ? ? ?Date of Follow-up Visit: _22__ / _Mar___/ _2023___   at 09___:_45___      _0   Time unknown ?       DD/MMM/YYYY        HH;MM ? ?Local labs collected  _22__ / _MAR___/ _2023___   at _10__:_25___      _1   Time unknown ?       DD/MMM/YYYY  HH;MM ? ?Was there any change in subjects's medications?  _2  YES  _3   NO ?    Update:Entresto Increased ? ?Did the subject experience any NEW adverse events?  _4  YES  _5   NO ?    Update:____ ? ?Physical Exam completed my Dr Aundra Dubin ? ?Was Auditory Testing performed at this visit?  _6   YES  _7   NO ?     If No, provide reason: ____ ? ? ? ? ?COMMENTS: Patient stated that he is doing well since being discharged.  Patient denies any complaints of swelling in his legs or shortness of breath.  Patient stated that he is taking his medications as prescribed and that he tries to keep his legs elevated when sitting for long period of time.  Patient reported that he is trying to exercise some, he reports that he goes outside and walks around to help keep the fluid from building in his feet and ankles.  Patient denies any ER or clinic visit since discharge.  Patients discharge wt was 233 lb todays weight was 237.9 lb.  Educated patient to call research or HF clinic if he notices a significant weight gain over night.  Patient stated that he does weigh every morning.   ? ? ? ? ?Form is based on Pages 35 of FASTR Trial  eCRFs    Protocol [id number]  ? ? ?Outpatient Encounter Medications as of 07/03/2021  ?Medication Sig  ? ascorbic acid (VITAMIN C) 500 MG tablet Take 500-1,000 mg by mouth daily.  ? aspirin 81 MG chewable tablet Chew 1 tablet (81 mg total) by mouth daily.  ? carvedilol (COREG) 3.125 MG tablet Take 1 tablet (3.125 mg total) by mouth 2 (two) times daily with a meal.  ? dapagliflozin propanediol (FARXIGA) 10 MG TABS tablet Take 1 tablet (10 mg total) by mouth daily.  ? digoxin (LANOXIN)  0.125 MG tablet Take 1 tablet (125 mcg total) by mouth daily.  ? fluticasone furoate-vilanterol (BREO ELLIPTA) 200-25 MCG/INH AEPB Inhale 1 puff into the lungs daily.  ? potassium chloride SA (KLOR-CON) 20 MEQ tablet Take 1 tablet (20 mEq total) by mouth daily.  ? sacubitril-valsartan (ENTRESTO) 49-51 MG Take 1 tablet by mouth 2 (two) times daily.  ? simvastatin (ZOCOR) 20 MG tablet Take 1 tablet (20 mg total) by mouth at bedtime.  ? spironolactone (ALDACTONE) 25 MG tablet Take 1 tablet (25 mg total) by mouth daily.  ? torsemide (DEMADEX) 20 MG tablet Take 1 tablet (20 mg total) by mouth 2 (two) times daily.  ? [DISCONTINUED] sacubitril-valsartan (ENTRESTO) 24-26 MG Take 1 tablet by mouth 2 (two) times daily.  ? ?No facility-administered encounter medications on file as of 07/03/2021.  ? ? ? ? ? ? ?   ?         ?     ?

## 2021-07-03 NOTE — Progress Notes (Addendum)
?  ?  ? ?ID:  Kevin Cardenas, DOB 1975/09/30, MRN [id number]   ?Provider location: [facility] ?Type of Visit: Established patient ? ?PCP:  Kevin Lima, MD  ?Cardiologist:  [name] ?  ?History of Present Illness: ?Kevin Cardenas is a 46 y.o. male who has a history of VSD repair and removal of a pulmonary artery band at age 24, DM, HTN, systolic heart failure, NICM and OSA diagnosed in past but not on CPAP. Has St Jude ICD (placed 01/2015).  ? ?Echo in 3/20 showed EF 25%, mild LV dilation, mildly decreased RV systolic function.  ? ?CPX in 10/20 showed moderate functional limitation primarily due to restrictive lung disease.  I had planned to get PFTs and a high resolution CT chest to followup on lung restriction but was never scheduled.  ? ?At last appointment, he had been off his meds for several months but supposedly had restarted them.  Weight was up 20 lbs and he was volume overloaded.  I stopped Lasix and started torsemide, kept other meds as ordered.  He came to the ER later in 7/21 with a bleeding varicose vein but was noted to be hypotensive with AKI.  He was thought to be over-diuresed.  Torsemide was cut back and Entresto was decreased.  ? ?Echo in 8/21 showed EF 30-35%, no significant residual VSD, normal RV.  ? ?Patient was admitted in 3/23 with CHF exacerbation, he was enrolled in the FASTR trial.  Echo showed EF 30-35%, mild LVH, no VSD, no significant pulmonary stenosis, moderate RVE with normal RV systolic function.  RHC/LHC showed normal coronaries and preserved cardiac index 3.28.  ?  ?Patient returns for followup of CHF.  Weight is stable.  He feels good, no dyspnea except with heavy exertion.  No dyspnea walking on flat ground.  No orthopnea/PND.  No lightheadedness.  He is taking all his meds currently.  There was some question about compliance prior to last admission. Using CPAP.  ? ?St Jude device interrogation: stable thoracic impedance, no VT/VF.  ? ?Labs (1/16):  K 4.1, creatinine 0.9, TSH normal, LDL 89, HDL 46 ?Labs (05/24/2014): K 4.1 Creatinine 0.97  ?Labs (3/16): BNP 54, K 4.4, creatinine 1.0 ?Labs 08/14/2014: K 4.0 Creatinine 0.93  ?Labs (6/16): digoxin < 0.2 ?Labs (9/16): K 3.7, creatinine 0.93, digoxin < 0.5, HCT 40.4 ?Labs (01/2016): dig 0.5 K 4.1 Creatinine 0.94  ?Labs (09/2017): K 4.3 Creatinine 0.95 ?Labs (3/20): K 4.3, creatinine 1.02, digoxin < 0.2, LDL 79 ?Labs (9/20): K 3.8, creatinine 0.82, digoxin < 0.2 ?Labs (7/21): K 3.8, creatinine 1.0 => 2.34 => 1.08, BNP 81, TSH normal ?Labs (9/21): digoxin 0.5, K 4.3, creatinine 1.12 ?Labs (5/22): digoxin 0.2, K 4, creatinine 1.06 ?Labs (3/23): K 3.8, creatinine 0.89, hgb 14.1 ?   ?PMH: ?1. VSD repair at 1.  ?2. Type 2 diabetes. ?3. HTN ?4. OSA: CPAP ?5. Chronic systolic CHF: Nonischemic cardiomyopathy.  St Jude ICD.  ?- Echo 2009: EF 35-40% ?- [place] 2009: No angiographic coronary disease.  ?- Echo 9/11: LVEF 20-25%  ?- Echo 11/12: LVEF 30-35%, diffuse hypokinesis and mild LVH. RV systolic mildly reduced. LA mildly dilated. Pulmonic valve with mild stenosis.  ?- Echo 05/12/2014: EF 15-20% Grade IDD, LV moderately dilated.  RV normal. Mild pulmonc stenosis mean peak gradient 14.  ?- Echo 7/16: EF 25-30%, mild LV dilation, mild LVH, trivial pulmonic stenosis with peak gradient 11 mmHg, mildly decreased RV function with mild RV dilation.   ?-  CPX (3/16): RER 1.03, peak VO2 19.9, VE/VCO2 slope 18, mild to moderately impaired functional capacity.  There was moderate to severe mixed restrictive/obstructive pattern on spirometry => exertional hypoxemia, oxygen saturation decreased to 84%.  ?- RHC (4/16): mean RA 9, PA 35/12, mean PCWP 8, CI 1.8 Fick, CI 2.4 thermo.  ?- Echo 02/2106: EF 30-35% Grade IIDD  ?- Echo 3/20: EF 25%, mild LV dilation, diffuse hypokinesis, mildly decreased RV systolic function with mild RV dilation, no definite PS noted.  ?- CPX (10/20): VE/VCO2 16, peak VO2 15.8 (57% predicted), RER 1.08.  Moderate  functional limitation primarily due to restrictive lung disease.  Severe restriction on PFTs.  ?- Echo (8/21): EF 30-35%, no significant residual VSD, normal RV.  ?- Echo (3/23): EF 30-35%, mild LVH, no VSD, no significant pulmonary stenosis, moderate RVE with normal RV systolic function.  ?- LHC/RHC (3/23): Normal coronaries, mean RA 8, PA 47/27 mean 37, mean PCWP 14, CI 3.28, PVR 3.3 WU ?6. Pulmonic stenosis: Mild in past, not significant on 3/23 echo.  ?7. High resolution CT chest in 8/21: No evidence for ILD.  ? ?Current Outpatient Medications  ?Medication Sig Dispense Refill  ? ascorbic acid (VITAMIN C) 500 MG tablet Take 500-1,000 mg by mouth daily.    ? aspirin 81 MG chewable tablet Chew 1 tablet (81 mg total) by mouth daily. 30 tablet 0  ? carvedilol (COREG) 3.125 MG tablet Take 1 tablet (3.125 mg total) by mouth 2 (two) times daily with a meal. 60 tablet 1  ? dapagliflozin propanediol (FARXIGA) 10 MG TABS tablet Take 1 tablet (10 mg total) by mouth daily. 30 tablet 1  ? digoxin (LANOXIN) 0.125 MG tablet Take 1 tablet (125 mcg total) by mouth daily. 90 tablet 2  ? fluticasone furoate-vilanterol (BREO ELLIPTA) 200-25 MCG/INH AEPB Inhale 1 puff into the lungs daily. 30 each 11  ? potassium chloride SA (KLOR-CON) 20 MEQ tablet Take 1 tablet (20 mEq total) by mouth daily. 90 tablet 2  ? sacubitril-valsartan (ENTRESTO) 49-51 MG Take 1 tablet by mouth 2 (two) times daily. 180 tablet 3  ? simvastatin (ZOCOR) 20 MG tablet Take 1 tablet (20 mg total) by mouth at bedtime. 90 tablet 3  ? spironolactone (ALDACTONE) 25 MG tablet Take 1 tablet (25 mg total) by mouth daily. 30 tablet 1  ? torsemide (DEMADEX) 20 MG tablet Take 1 tablet (20 mg total) by mouth 2 (two) times daily. 60 tablet 1  ? ?No current facility-administered medications for this encounter.  ? ? ?Allergies:   Metolazone  ? ?Social History:  The patient  reports that he quit smoking about 10 years ago. His smoking use included cigarettes. He has a 4.50  pack-year smoking history. He has never used smokeless tobacco. He reports that he does not drink alcohol and does not use drugs.  ? ?Family History:  The patient's family history includes Diabetes in his maternal grandfather and paternal grandmother; Lung cancer in his father; Stroke in his father and mother.  ? ?ROS:  Please see the history of present illness.   All other systems are personally reviewed and negative.  ? ?Exam:   ?BP 124/80   Pulse 85   Wt 109.5 kg (241 lb 6.4 oz)   SpO2 95%   BMI 38.96 kg/m?  ?General: NAD ?Neck: No JVD, no thyromegaly or thyroid nodule.  ?Lungs: Clear to auscultation bilaterally with normal respiratory effort. ?CV: Nondisplaced PMI.  Heart regular S1/S2, no S3/S4, 2/6 SEM LUSB.  No  peripheral edema.  No carotid bruit.  Normal pedal pulses.  ?Abdomen: Soft, nontender, no hepatosplenomegaly, no distention.  ?Skin: Intact without lesions or rashes.  ?Neurologic: Alert and oriented x 3.  ?Psych: Normal affect. ?Extremities: No clubbing or cyanosis.  ?HEENT: Normal.  ? ?Recent Labs: ?06/19/2021: B Natriuretic Peptide 235.5; TSH 0.984 ?06/24/2021: Hemoglobin 14.1; Magnesium 2.1; Platelets 137 ?07/03/2021: ALT 38; BUN 27; Creatinine, Ser 1.23; Potassium 4.4; Sodium 139  ?Personally reviewed  ? ?Wt Readings from Last 3 Encounters:  ?07/03/21 107.9 kg (237 lb 14.4 oz)  ?07/03/21 109.5 kg (241 lb 6.4 oz)  ?06/24/21 106 kg (233 lb 11.2 oz)  ? ? ?ASSESSMENT AND PLAN: ? ?1. Chronic systolic CHF: Nonischemic cardiomyopathy.  RHC in 4/16 showed preserved cardiac index by thermodilution but low by Fick.   Last echo in 8/21 showed EF 30-35%, normal RV, no residual VSD, no definite pulmonic stenosis.  Echo in 3/23 showed EF 30-35%, mild LVH, no VSD, no significant pulmonary stenosis, moderate RVE with normal RV systolic function.   LHC/RHC in 3/23 with no coronary disease, CI 3.28.  He has a Research officer, political party ICD.  NYHA class II, not volume overloaded on exam or by [place].  ?- Continue Coreg 3.125 mg bid.   ?- Increase Entresto to 49/51 bid, BMET today and in 10 days.    ?- Continue spironolactone 25 mg daily.  ?- Continue digoxin 0.125 and check level.   ?- Continue torsemide 20 mg bid.   ?- Continue KCL 20 mEq daily

## 2021-07-04 ENCOUNTER — Telehealth (HOSPITAL_COMMUNITY): Payer: Self-pay

## 2021-07-04 LAB — CBC
Hematocrit: 45.5 % (ref 37.5–51.0)
Hemoglobin: 15 g/dL (ref 13.0–17.7)
MCH: 31.4 pg (ref 26.6–33.0)
MCHC: 33 g/dL (ref 31.5–35.7)
MCV: 95 fL (ref 79–97)
Platelets: 229 10*3/uL (ref 150–450)
RBC: 4.77 x10E6/uL (ref 4.14–5.80)
RDW: 12.2 % (ref 11.6–15.4)
WBC: 4.8 10*3/uL (ref 3.4–10.8)

## 2021-07-04 LAB — MAGNESIUM: Magnesium: 2.6 mg/dL — ABNORMAL HIGH (ref 1.6–2.3)

## 2021-07-04 NOTE — Progress Notes (Signed)
FASTR Labs from 7 Day post discharge Visit

## 2021-07-04 NOTE — Addendum Note (Signed)
Encounter addended by: Larey Dresser, MD on: 07/04/2021 10:46 AM ? Actions taken: Clinical Note Signed

## 2021-07-04 NOTE — Telephone Encounter (Signed)
Left message for Kevin Cardenas to return my call to set up home paramedicine visit for next week. I will continue to follow up.  ?

## 2021-07-08 ENCOUNTER — Telehealth: Payer: Self-pay | Admitting: Family Medicine

## 2021-07-08 NOTE — Telephone Encounter (Signed)
Copied from [place] [phone]. Topic: Referral - Request for Referral ?>> Jul 05, 2021 12:04 PM Tessa Lerner A wrote: ?Has patient seen PCP for this complaint? No. ?*If NO, is insurance requiring patient see PCP for this issue before PCP can refer them? ?Referral for which specialty: Dentistry  ?Preferred provider/office: Patient has no preference  ?Reason for referral: Oral discomfort ?

## 2021-07-08 NOTE — Telephone Encounter (Signed)
Patient was called and informed that he can call and make appt without referral.  ?

## 2021-07-10 ENCOUNTER — Telehealth (HOSPITAL_COMMUNITY): Payer: Self-pay

## 2021-07-10 NOTE — Telephone Encounter (Signed)
Attempted to reach Kevin Cardenas to schedule home visit with no answer on the phone. Message left, I will continue to follow up.  ?

## 2021-07-11 ENCOUNTER — Telehealth (HOSPITAL_COMMUNITY): Payer: Self-pay

## 2021-07-11 NOTE — Telephone Encounter (Signed)
Spoke to Kevin Cardenas and set up home paramedicine visit. He agreed to home visit on Tuesday at 2:00. I also reminded him of lab visit on Monday. He agreed with plan. Call complete.  ?

## 2021-07-15 ENCOUNTER — Ambulatory Visit (INDEPENDENT_AMBULATORY_CARE_PROVIDER_SITE_OTHER): Payer: 59

## 2021-07-15 ENCOUNTER — Ambulatory Visit (HOSPITAL_COMMUNITY)
Admission: RE | Admit: 2021-07-15 | Discharge: 2021-07-15 | Disposition: A | Discharge status: Home / Self Care | Payer: 59 | Source: Ambulatory Visit | Attending: Internal Medicine | Admitting: Internal Medicine

## 2021-07-15 ENCOUNTER — Other Ambulatory Visit (HOSPITAL_COMMUNITY): Payer: Self-pay

## 2021-07-15 DIAGNOSIS — Z95810 Presence of automatic (implantable) cardiac defibrillator: Secondary | ICD-10-CM

## 2021-07-15 DIAGNOSIS — I5022 Chronic systolic (congestive) heart failure: Secondary | ICD-10-CM

## 2021-07-15 LAB — BASIC METABOLIC PANEL
Anion gap: 7 (ref 5–15)
BUN: 13 mg/dL (ref 6–20)
CO2: 33 mmol/L — ABNORMAL HIGH (ref 22–32)
Calcium: 8.4 mg/dL — ABNORMAL LOW (ref 8.9–10.3)
Chloride: 99 mmol/L (ref 98–111)
Creatinine, Ser: 1.1 mg/dL (ref 0.61–1.24)
GFR, Estimated: >60 mL/min (ref >60)
Glucose, Bld: 103 mg/dL — ABNORMAL HIGH (ref 70–99)
Potassium: 3.8 mmol/L (ref 3.5–5.1)
Sodium: 139 mmol/L (ref 135–145)

## 2021-07-15 NOTE — Progress Notes (Signed)
EPIC Encounter for ICM Monitoring ? ?Patient Name: Kevin Cardenas is a 46 y.o. male ?Date: 07/15/2021 ?Primary Care Physican: Dorna Mai, MD ?Primary Cardiologist: Aundra Dubin ?Electrophysiologist: Caryl Comes ?07/16/2021 Weight: 233-234 lbs (baseline) ?     ? ?1st ICM remote transmission.  Heart Failure questions reviewed.  Pt asymptomatic for fluid accumulation.  He thinks he may be drinking too much fluid. ?  ?CorVue thoracic impedance suggesting possible fluid accumulation starting 4/1.  ? ?Prescribed:  ?Torsemide 20 mg take 1 tablet(s) (20 mg total) by mouth twice a day. ?Potassium 20 mEq take 1 tablet(s) (20 mEq total) by mouth daily ? ?Labs: ?07/15/2021 Creatinine 1.10, BUN 13, Potassium 3.8, Sodium 139, GFR>60 ?07/03/2021 Creatinine 1.23, BUN 27, Potassium 4.4, Sodium 139, GFR >60 ?06/24/2021 Creatinine 0.89, BUN 17, Potassium 3.8, Sodium 135, GFR >60 ?06/23/2021 Creatinine 0.95, BUN 18, Potassium 3.8, Sodium 130, GFR >60  ?06/22/2021 Creatinine 1.05, BUN 21, Potassium 4.2, Sodium 131, GFR >60  ?06/21/2021 Creatinine 0.99, BUN 16, Potassium 3.6, Sodium 137, GFR >60  ?06/20/2021 Creatinine 1.03, BUN 12, Potassium 3.1, Sodium 136, GFR >60  ?06/19/2021 Creatinine 1.26, BUN 17, Potassium 4.1, Sodium 126, GFR >60  ?A complete set of results can be found in Results Review. ? ?Recommendations:   Advised to take extra 20 mg Furosemide x 2 days and then return to prescribed dosage of 20 mg twice a day.   Reinforced limiting salt and fluid intake daily.   ? ?Follow-up plan: ICM clinic phone appointment on 07/23/2021 to recheck fluid levels.   91 day device clinic remote transmission 07/17/2021.   ? ?EP/Cardiology Office Visits: 07/24/2021 with HF clinic PA/NP.    Message sent to EP scheduler to contact patient to schedule overdue EP OV.  Recall 07/20/2020 with Dr Caryl Comes. ? ?Copy of ICM check sent to Dr. Caryl Comes and Dr Aundra Dubin for review and further recommendations if needed.    ? ?3 month ICM trend: 07/15/2021. ? ? ? ?12-14 Month ICM  trend:  ? ? ? ?Rosalene Billings, RN ?07/15/2021 ?8:59 AM ? ?

## 2021-07-16 MED ORDER — TORSEMIDE 20 MG PO TABS: ORAL_TABLET | ORAL | 1 refills | Status: DC

## 2021-07-16 NOTE — Progress Notes (Signed)
Keep torsemide at 40 qam/20 qpm.  BMET 10 days.  ?

## 2021-07-16 NOTE — Progress Notes (Signed)
Spoke with patient and advised Dr Aundra Dubin had different recommendations than ones given earlier.  Advised he recommended to change Torsemide dosage to 40 mg every morning and 20 mg every evening.   BMET in 10 days and patient will have blood drawn on 4/12 OV.  He verbalized Torsemide dosage change and agreed with plan.   ?

## 2021-07-17 ENCOUNTER — Other Ambulatory Visit: Payer: Self-pay | Admitting: Family Medicine

## 2021-07-17 ENCOUNTER — Ambulatory Visit (INDEPENDENT_AMBULATORY_CARE_PROVIDER_SITE_OTHER): Payer: 59

## 2021-07-17 ENCOUNTER — Other Ambulatory Visit (HOSPITAL_COMMUNITY): Payer: Self-pay

## 2021-07-17 DIAGNOSIS — I5022 Chronic systolic (congestive) heart failure: Secondary | ICD-10-CM | POA: Diagnosis not present

## 2021-07-17 DIAGNOSIS — J4541 Moderate persistent asthma with (acute) exacerbation: Secondary | ICD-10-CM

## 2021-07-17 MED ORDER — FLUTICASONE FUROATE-VILANTEROL 100-25 MCG/ACT IN AEPB
1.0000 | INHALATION_SPRAY | Freq: Every day | RESPIRATORY_TRACT | 11 refills | Status: AC
  Filled 2021-07-17: qty 60, 30d supply, fill #0
  Filled 2021-09-17: qty 60, 30d supply, fill #1

## 2021-07-17 NOTE — Progress Notes (Signed)
Paramedicine Encounter ? ? ? Patient ID: Kevin Cardenas, male    DOB: 1975/05/28, 46 y.o.   MRN: [id number] ? ? ?Arrived for home visit for [facility] who reports feeling good denying any chest pain, dizziness or shortness of breath. He states he is using two pillows at night to sleep with no difficulty. He is taking his medications out of the bottles right now and does not feel like he needs a pill box. He was able to go through medication list and tell me what he was taking and when and how much. Currently he needs a few refills: ?Carvedilol ?Wilder Glade  ?Breo-Ellipta  ?( I will have them called in to [place] and pick them up and deliver for him) ? ?We discussed moving medications to [place] for delivery services, he said he will think about it.  ? ?Vitals and assessment as noted. No lower leg edema. Lungs clear. No abdominal distention. Weight is down 3 lbs. He and I discussed diet and he knows to limit salt and fluids. He weighs daily and keeps a note of same in his room.  ? ?I advised him of his upcoming appointment next week in clinic and he plans to call medicaid transportation to set up same for ride. I plan to meet him there.  ? ?Home visit complete I will see him in one week.  ? ? ? ? ?Patient Care Team: ?Dorna Mai, MD as PCP - General (Family Medicine) ?Larey Dresser, MD as PCP - Advanced Heart Failure (Cardiology) ?Deboraha Sprang, MD as PCP - Electrophysiology (Cardiology) ?Larey Dresser, MD as PCP - Cardiology (Cardiology) ? ?Patient Active Problem List  ? Diagnosis Date Noted  ? Acute on chronic systolic and diastolic heart failure, NYHA class 3 ([place]) 06/19/2021  ? HFrEF (heart failure with reduced ejection fraction) ([place]) 11/06/2019  ? AKI (acute kidney injury) ([place]) 11/05/2019  ? Orthostatic hypotension 11/04/2019  ? Class 2 obesity due to excess calories with serious comorbidity in adult 01/16/2016  ? Asthma, stable, mild intermittent 01/15/2016  ? OSA on CPAP 06/21/2014  ? Routine general  medical examination at a health care facility 04/18/2014  ? Hyperlipidemia with target LDL less than 100 04/17/2014  ? Non-ischemic cardiomyopathy ([place]) 02/06/2011  ? Systolic CHF, chronic ([place]) 02/06/2011  ? VSD (ventricular septal defect) 02/06/2011  ? HTN (hypertension) 02/06/2011  ? Prediabetes 01/30/2011  ? ? ?Current Outpatient Medications:  ?  ascorbic acid (VITAMIN C) 500 MG tablet, Take 500-1,000 mg by mouth daily., Disp: , Rfl:  ?  aspirin 81 MG chewable tablet, Chew 1 tablet (81 mg total) by mouth daily., Disp: 30 tablet, Rfl: 0 ?  carvedilol (COREG) 3.125 MG tablet, Take 1 tablet (3.125 mg total) by mouth 2 (two) times daily with a meal., Disp: 60 tablet, Rfl: 1 ?  dapagliflozin propanediol (FARXIGA) 10 MG TABS tablet, Take 1 tablet (10 mg total) by mouth daily., Disp: 30 tablet, Rfl: 1 ?  digoxin (LANOXIN) 0.125 MG tablet, Take 1 tablet (125 mcg total) by mouth daily., Disp: 90 tablet, Rfl: 2 ?  fluticasone furoate-vilanterol (BREO ELLIPTA) 200-25 MCG/INH AEPB, Inhale 1 puff into the lungs daily., Disp: 30 each, Rfl: 11 ?  potassium chloride SA (KLOR-CON) 20 MEQ tablet, Take 1 tablet (20 mEq total) by mouth daily., Disp: 90 tablet, Rfl: 2 ?  sacubitril-valsartan (ENTRESTO) 49-51 MG, Take 1 tablet by mouth 2 (two) times daily., Disp: 180 tablet, Rfl: 3 ?  simvastatin (ZOCOR) 20 MG tablet, Take 1  tablet (20 mg total) by mouth at bedtime., Disp: 90 tablet, Rfl: 3 ?  spironolactone (ALDACTONE) 25 MG tablet, Take 1 tablet (25 mg total) by mouth daily., Disp: 30 tablet, Rfl: 1 ?  torsemide (DEMADEX) 20 MG tablet, Take 2 tablets (40 mg total) by mouth every morning AND 1 tablet (20 mg total) every evening., Disp: 270 tablet, Rfl: 1 ?Allergies  ?Allergen Reactions  ? Metolazone Other (See Comments)  ?  "Dried up my kidneys- took only one tablet"  ? ? ? ?Social History  ? ?Socioeconomic History  ? Marital status: Single  ?  Spouse name: Recently Married   ? Number of children: 0  ? Years of education: Not on  file  ? Highest education level: Not on file  ?Occupational History  ? Occupation: Librarian, academic   ?  Comment: 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   ?Tobacco Use  ? Smoking status: Former  ?  Packs/day: 0.30  ?  Years: 15.00  ?  Pack years: 4.50  ?  Types: Cigarettes  ?  Quit date: 04/08/2011  ?  Years since quitting: 10.2  ? Smokeless tobacco: Never  ? Tobacco comments:  ?  Smokes depends on who is around him.    ?Substance and Sexual Activity  ? Alcohol use: No  ?  Alcohol/week: 0.0 standard drinks  ?  Comment: Rarely   ? Drug use: No  ? Sexual activity: Not Currently  ?Other Topics Concern  ? Not on file  ?Social History Narrative  ? Caffienated drinks-no  ? Seat belt use often-no  ? Regular Exercise-  ? Smoke alarm in the home-yes  ? Firearms/guns in the home-no  ? History of physical abuse-no  ?   ?   ?   ?   ? ?Social Determinants of Health  ? ?Financial Resource Strain: Not on file  ?Food Insecurity: No Food Insecurity  ? Worried About Charity fundraiser in the Last Year: Never true  ? Ran Out of Food in the Last Year: Never true  ?Transportation Needs: No Transportation Needs  ? Lack of Transportation (Medical): No  ? Lack of Transportation (Non-Medical): No  ?Physical Activity: Not on file  ?Stress: Not on file  ?Social Connections: Not on file  ?Intimate Partner Violence: Not on file  ? ? ?Physical Exam ?Vitals reviewed.  ?Constitutional:   ?   Appearance: Normal appearance. He is normal weight.  ?HENT:  ?   Head: Normocephalic.  ?   Nose: Nose normal.  ?   Mouth/Throat:  ?   Mouth: Mucous membranes are moist.  ?   Pharynx: Oropharynx is clear.  ?Eyes:  ?   Conjunctiva/sclera: Conjunctivae normal.  ?   Pupils: Pupils are equal, round, and reactive to light.  ?Cardiovascular:  ?   Rate and Rhythm: Normal rate and regular rhythm.  ?   Pulses: Normal pulses.  ?   Heart sounds: Normal heart sounds.  ?Pulmonary:  ?   Effort: Pulmonary effort is normal.  ?   Breath sounds: Normal breath sounds.   ?Abdominal:  ?   General: Abdomen is flat.  ?   Palpations: Abdomen is soft.  ?Musculoskeletal:     ?   General: No swelling. Normal range of motion.  ?   Cervical back: Normal range of motion.  ?   Right lower leg: No edema.  ?   Left lower leg: No edema.  ?Skin: ?   General: Skin is warm  and dry.  ?   Capillary Refill: Capillary refill takes less than 2 seconds.  ?Neurological:  ?   General: No focal deficit present.  ?   Mental Status: He is alert. Mental status is at baseline.  ?Psychiatric:     ?   Mood and Affect: Mood normal.  ? ? ? ? ? ? ?Future Appointments  ?Date Time Provider [place]  ?07/23/2021  7:00 AM [facility] DEVICE REMOTES [facility]  ?07/24/2021  8:30 AM [facility] PA/NP [facility] None  ?09/17/2021  9:00 AM [facility] PA/NP [facility] None  ?10/16/2021  7:00 AM [facility] DEVICE REMOTES [facility]  ?01/15/2022  7:00 AM [facility] DEVICE REMOTES [facility]  ?04/16/2022  7:00 AM [facility] DEVICE REMOTES [facility]  ? ? ? ?ACTION: ?Home visit completed ? ? ? ? ? ? ?

## 2021-07-18 ENCOUNTER — Other Ambulatory Visit (HOSPITAL_COMMUNITY): Payer: Self-pay

## 2021-07-18 LAB — CUP PACEART REMOTE DEVICE CHECK
Battery Remaining Longevity: 47 mo
Battery Remaining Percentage: 44 %
Battery Voltage: 2.95 V
Brady Statistic RV Percent Paced: 1 %
Date Time Interrogation Session: 20230405025434
HighPow Impedance: 78 Ohm
HighPow Impedance: 78 Ohm
Implantable Lead Implant Date: 20161007
Implantable Lead Location: 753860
Implantable Lead Model: 181
Implantable Lead Serial Number: 331179
Implantable Pulse Generator Implant Date: 20161007
Lead Channel Impedance Value: 410 Ohm
Lead Channel Pacing Threshold Amplitude: 0.75 V
Lead Channel Pacing Threshold Pulse Width: 0.5 ms
Lead Channel Sensing Intrinsic Amplitude: 11.7 mV
Lead Channel Setting Pacing Amplitude: 2.5 V
Lead Channel Setting Pacing Pulse Width: 0.5 ms
Lead Channel Setting Sensing Sensitivity: 0.5 mV
Pulse Gen Serial Number: 7286706

## 2021-07-18 NOTE — Progress Notes (Signed)
Paramedicine Encounter ? ? ? Patient ID: Kevin Cardenas, male    DOB: [birth year], 46 y.o.   MRN: [id number] ? ?Obtained refills from [place] for [name]. I delivered same to him at home. Visit complete.  ? ?ACTION: ?Home visit completed ? ? ? ? ? ? ?

## 2021-07-22 NOTE — Progress Notes (Signed)
error ?

## 2021-07-23 ENCOUNTER — Ambulatory Visit (INDEPENDENT_AMBULATORY_CARE_PROVIDER_SITE_OTHER): Payer: 59

## 2021-07-23 ENCOUNTER — Telehealth (HOSPITAL_COMMUNITY): Payer: Self-pay

## 2021-07-23 DIAGNOSIS — Z95810 Presence of automatic (implantable) cardiac defibrillator: Secondary | ICD-10-CM

## 2021-07-23 DIAGNOSIS — I5022 Chronic systolic (congestive) heart failure: Secondary | ICD-10-CM

## 2021-07-23 NOTE — Telephone Encounter (Signed)
Spoke with patient and reminded him of HF clinic appointment tomorrow morning, 4/12 at 8:30 AM.  He confirmed he will be at the appointment.  Advised would let HF clinic know he confirmed appointment since the office tried calling him earlier today.   ?

## 2021-07-23 NOTE — Telephone Encounter (Signed)
Called and left patient a detailed message to confirm/remind patient of their appointment at the [facility] on 07/24/21.  ? ? ?

## 2021-07-23 NOTE — Progress Notes (Signed)
EPIC Encounter for ICM Monitoring ? ?Patient Name: Kevin Cardenas is a 46 y.o. male ?Date: 07/23/2021 ?Primary Care Physican: Dorna Mai, MD ?Primary Cardiologist: Aundra Dubin ?Electrophysiologist: Caryl Comes ?07/16/2021 Weight: 233-234 lbs (baseline) ?                                                         ?  ?Spoke with patient and heart Failure questions reviewed.  Pt asymptomatic for fluid accumulation.   ?  ?CorVue thoracic impedance suggesting fluid levels returned to normal with increase in morning Torsemide dosage.  ?  ?Prescribed:  ?Torsemide 20 mg take 2 tablet(s) (40 mg total) by mouth every morning and 20 mg every afternoon. ?Potassium 20 mEq take 1 tablet(s) (20 mEq total) by mouth daily ?  ?Labs: ?07/15/2021 Creatinine 1.10, BUN 13, Potassium 3.8, Sodium 139, GFR>60 ?07/03/2021 Creatinine 1.23, BUN 27, Potassium 4.4, Sodium 139, GFR >60 ?06/24/2021 Creatinine 0.89, BUN 17, Potassium 3.8, Sodium 135, GFR >60 ?06/23/2021 Creatinine 0.95, BUN 18, Potassium 3.8, Sodium 130, GFR >60  ?06/22/2021 Creatinine 1.05, BUN 21, Potassium 4.2, Sodium 131, GFR >60  ?06/21/2021 Creatinine 0.99, BUN 16, Potassium 3.6, Sodium 137, GFR >60  ?06/20/2021 Creatinine 1.03, BUN 12, Potassium 3.1, Sodium 136, GFR >60  ?06/19/2021 Creatinine 1.26, BUN 17, Potassium 4.1, Sodium 126, GFR >60  ?A complete set of results can be found in Results Review. ?  ?Recommendations:   No changes and encouraged to call if experiencing any fluid symptoms.   ?  ?Follow-up plan: ICM clinic phone appointment on 08/19/2021.   91 day device clinic remote transmission 10/16/2021.   ?  ?EP/Cardiology Office Visits: 07/24/2021 with HF clinic PA/NP.    Message sent to EP scheduler to contact patient to schedule overdue EP OV.  Recall 07/20/2020 with Dr Caryl Comes. ?  ?Copy of ICM check sent to Dr. Caryl Comes.    ? ?3 month ICM trend: 07/23/2021. ? ? ? ?12-14 Month ICM trend:  ? ? ? ?Rosalene Billings, RN ?07/23/2021 ?1:40 PM ? ?

## 2021-07-24 ENCOUNTER — Ambulatory Visit (HOSPITAL_COMMUNITY)
Admission: RE | Admit: 2021-07-24 | Discharge: 2021-07-24 | Disposition: A | Discharge status: Home / Self Care | Payer: 59 | Source: Ambulatory Visit

## 2021-07-24 ENCOUNTER — Ambulatory Visit (HOSPITAL_COMMUNITY)
Admit: 2021-07-24 | Discharge: 2021-07-24 | Disposition: A | Discharge status: Home / Self Care | Payer: 59 | Attending: Family Medicine | Admitting: Family Medicine

## 2021-07-24 ENCOUNTER — Other Ambulatory Visit: Payer: Self-pay

## 2021-07-24 ENCOUNTER — Encounter: Payer: 59 | Admitting: *Deleted

## 2021-07-24 ENCOUNTER — Other Ambulatory Visit (HOSPITAL_COMMUNITY): Payer: Self-pay

## 2021-07-24 ENCOUNTER — Encounter (HOSPITAL_COMMUNITY): Payer: Self-pay

## 2021-07-24 VITALS — BP 94/52 | HR 83 | Temp 97.9°F | Resp 18 | Ht 66.0 in | Wt 233.6 lb

## 2021-07-24 VITALS — BP 98/60 | HR 86 | Wt 238.4 lb

## 2021-07-24 DIAGNOSIS — I5022 Chronic systolic (congestive) heart failure: Secondary | ICD-10-CM

## 2021-07-24 DIAGNOSIS — Z006 Encounter for examination for normal comparison and control in clinical research program: Secondary | ICD-10-CM

## 2021-07-24 LAB — DIGOXIN LEVEL: Digoxin Level: 0.3 ng/mL — ABNORMAL LOW (ref 0.8–2.0)

## 2021-07-24 NOTE — Progress Notes (Signed)
Paramedicine Encounter ? ? ? Patient ID: Kevin Cardenas, male    DOB: 03-31-1976, 46 y.o.   MRN: [id number] ? ?Clinic visit today for Kevin Cardenas who reports feeling good with no complaints today. He denied chest pain, shortness of breath or dizziness. No swelling noted on assessment.  ? ?Vitals taken by HF RN.  ? ?Home weight is 233lbs ? ?Seen by [name] ? ?No meds changes made today.  ? ?Continue paramedicine.  ? ?3 month follow up with [name].  ? ?FASTR study patient. Followed [facility].  ? ?I will follow up in the home in one week.  ? ?ACTION: ?Next visit planned for one week ? ? ? ? ? ? ?

## 2021-07-24 NOTE — Progress Notes (Signed)
error         ?     ?

## 2021-07-24 NOTE — Patient Instructions (Signed)
Thank you for coming in today ? ?Labs were done today, if any labs are abnormal the clinic will call you ?No news is good news  ? ?Your physician recommends that you schedule a follow-up appointment in:  ?3 months with [name] ? ?At the [facility], you and your health needs are our priority. As part of our continuing mission to provide you with exceptional heart care, we have created designated Provider Care Teams. These Care Teams include your primary Cardiologist (physician) and Advanced Practice Providers (APPs- Physician Assistants and Nurse Practitioners) who all work together to provide you with the care you need, when you need it.  ? ?You may see any of the following providers on your designated Care Team at your next follow up: ?Dr Glori Bickers ?Dr [name] ?Darrick Grinder, NP ?Lyda Jester, PA ?Jessica Milford,NP ?Marlyce Huge, PA ?Audry Riles, PharmD ? ? ?Please be sure to bring in all your medications bottles to every appointment.  ? ?If you have any questions or concerns before your next appointment please send [place] a message through [place] or call our office at [phone].   ? ?TO LEAVE A MESSAGE FOR THE NURSE SELECT OPTION 2, PLEASE LEAVE A MESSAGE INCLUDING: ?YOUR NAME ?DATE OF BIRTH ?CALL BACK NUMBER ?REASON FOR CALL**this is important as we prioritize the call backs ? ?YOU WILL RECEIVE A CALL BACK THE SAME DAY AS LONG AS YOU CALL BEFORE 4:00 PM ? ? ?

## 2021-07-24 NOTE — Progress Notes (Addendum)
?  ?  ? ?ID:  Cardenas Kevin, DOB [birth year], MRN [id number]   ?Provider location: [facility] ?Type of Visit: Established patient ? ?PCP:  [name], MD  ?HF Cardiologist:  [name] ?  ?History of Present Illness: ?Kevin Cardenas is a 46 y.o. male who has a history of VSD repair and removal of a pulmonary artery band at age 11, DM, HTN, systolic heart failure, NICM and OSA diagnosed in past but not on CPAP. Has St Jude ICD (placed 01/2015).  ? ?Echo in 3/20 showed EF 25%, mild LV dilation, mildly decreased RV systolic function.  ? ?CPX in 10/20 showed moderate functional limitation primarily due to restrictive lung disease.  I had planned to get PFTs and a high resolution CT chest to followup on lung restriction but was never scheduled.  ? ?At last appointment, he had been off his meds for several months but supposedly had restarted them.  Weight was up 20 lbs and he was volume overloaded.  I stopped Lasix and started torsemide, kept other meds as ordered.  He came to the ER later in 7/21 with a bleeding varicose vein but was noted to be hypotensive with AKI.  He was thought to be over-diuresed.  Torsemide was cut back and Entresto was decreased.  ? ?Echo in 8/21 showed EF 30-35%, no significant residual VSD, normal RV.  ?  ?Admitted 3/23 with a/c respiratory failure with hypoxia secondary to a/c CHF and noncompliance with CPAP for OSA/OHS. PICC line placed. Co-ox okay. He was enrolled in FASTR trial for diuresis. Diuresed 24 lb. FASTR trial discontinued on 03/11. Transitioned to torsemide 20 mg bid (prior home dose). GDMT titrated.  R/LHC on 03/13 with normal coronaries, normal filling pressures and normal CO. Mild pulmonary HTN. Referred to paramedicine. HH PT/OT recommended. Patient declined [place]. ? ?Follow up 3/23 Patient returns for followup of CHF.  Weight is stable.  He feels good, no dyspnea except with heavy exertion.  No dyspnea walking on flat ground.  No  orthopnea/PND.  No lightheadedness.  He is taking all his meds currently.  There was some question about compliance prior to last admission. Using CPAP.  ? ?Today he returns for HF follow up, with paramedicine. Overall feeling fine. He has mild dyspnea walking up stairs. Works in the garden with no breathing difficulty. Denies palpitations,, CP, dizziness, edema, or PND/Orthopnea. Appetite ok. No fever or chills. Weight at home 233 pounds. Taking all medications. Using CPAP. ? ?St. Jude device interrogation (personally reviewed): CoreVue up last couple days but now normalized, 98% biV pacing, no VT. ? ?ECG (personally reviewed): none ordered today ? ?Labs (1/16): K 4.1, creatinine 0.9, TSH normal, LDL 89, HDL 46 ?Labs (05/24/2014): K 4.1 Creatinine 0.97  ?Labs (3/16): BNP 54, K 4.4, creatinine 1.0 ?Labs 08/14/2014: K 4.0 Creatinine 0.93  ?Labs (6/16): digoxin < 0.2 ?Labs (9/16): K 3.7, creatinine 0.93, digoxin < 0.5, HCT 40.4 ?Labs (01/2016): dig 0.5 K 4.1 Creatinine 0.94  ?Labs (09/2017): K 4.3 Creatinine 0.95 ?Labs (3/20): K 4.3, creatinine 1.02, digoxin < 0.2, LDL 79 ?Labs (9/20): K 3.8, creatinine 0.82, digoxin < 0.2 ?Labs (7/21): K 3.8, creatinine 1.0 => 2.34 => 1.08, BNP 81, TSH normal ?Labs (9/21): digoxin 0.5, K 4.3, creatinine 1.12 ?Labs (5/22): digoxin 0.2, K 4, creatinine 1.06 ?Labs (4/23): K 3.8, creatinine 1.10 ?   ?PMH: ?1. VSD repair at 1.  ?2. Type 2 diabetes. ?3. HTN ?4. OSA: Not using CPAP.  ?  5. Chronic systolic CHF: Nonischemic cardiomyopathy.  St Jude ICD.  ?- Echo 2009: EF 35-40% ?- [place] 2009: No angiographic coronary disease.  ?- Echo 9/11: LVEF 20-25%  ?- Echo 11/12: LVEF 30-35%, diffuse hypokinesis and mild LVH. RV systolic mildly reduced. LA mildly dilated. Pulmonic valve with mild stenosis.  ?- Echo 05/12/2014: EF 15-20% Grade IDD, LV moderately dilated.  RV normal. Mild pulmonc stenosis mean peak gradient 14.  ?- Echo 7/16: EF 25-30%, mild LV dilation, mild LVH, trivial pulmonic stenosis with  peak gradient 11 mmHg, mildly decreased RV function with mild RV dilation.   ?- CPX (3/16): RER 1.03, peak VO2 19.9, VE/VCO2 slope 18, mild to moderately impaired functional capacity.  There was moderate to severe mixed restrictive/obstructive pattern on spirometry => exertional hypoxemia, oxygen saturation decreased to 84%.  ?- RHC (4/16): mean RA 9, PA 35/12, mean PCWP 8, CI 1.8 Fick, CI 2.4 thermo.  ?- Echo 02/2106: EF 30-35% Grade IIDD  ?- Echo 3/20: EF 25%, mild LV dilation, diffuse hypokinesis, mildly decreased RV systolic function with mild RV dilation, no definite PS noted.  ?- CPX (10/20): VE/VCO2 16, peak VO2 15.8 (57% predicted), RER 1.08.  Moderate functional limitation primarily due to restrictive lung disease.  Severe restriction on PFTs.  ?- Echo (8/21): EF 30-35%, no significant residual VSD, normal RV.  ?6. Pulmonic stenosis: Mild in past, not significant on 2/20 echo.  ?7. High resolution CT chest in 8/21: No evidence for ILD.  ? ?Current Outpatient Medications  ?Medication Sig Dispense Refill  ? ascorbic acid (VITAMIN C) 500 MG tablet Take 500-1,000 mg by mouth daily.    ? aspirin 81 MG chewable tablet Chew 1 tablet (81 mg total) by mouth daily. 30 tablet 0  ? carvedilol (COREG) 3.125 MG tablet Take 1 tablet (3.125 mg total) by mouth 2 (two) times daily with a meal. 60 tablet 1  ? dapagliflozin propanediol (FARXIGA) 10 MG TABS tablet Take 1 tablet (10 mg total) by mouth daily. 30 tablet 1  ? digoxin (LANOXIN) 0.125 MG tablet Take 1 tablet (125 mcg total) by mouth daily. 90 tablet 2  ? fluticasone furoate-vilanterol (BREO ELLIPTA) 100-25 MCG/ACT AEPB Inhale 1 puff into the lungs daily. 60 each 11  ? potassium chloride SA (KLOR-CON) 20 MEQ tablet Take 1 tablet (20 mEq total) by mouth daily. 90 tablet 2  ? sacubitril-valsartan (ENTRESTO) 49-51 MG Take 1 tablet by mouth 2 (two) times daily. 180 tablet 3  ? simvastatin (ZOCOR) 20 MG tablet Take 1 tablet (20 mg total) by mouth at bedtime. 90 tablet 3  ?  spironolactone (ALDACTONE) 25 MG tablet Take 1 tablet (25 mg total) by mouth daily. 30 tablet 1  ? torsemide (DEMADEX) 20 MG tablet Take 2 tablets (40 mg total) by mouth every morning AND 1 tablet (20 mg total) every evening. 270 tablet 1  ? ?No current facility-administered medications for this encounter.  ? ? ?Allergies:   Metolazone  ? ?Social History:  The patient  reports that he quit smoking about 10 years ago. His smoking use included cigarettes. He has a 4.50 pack-year smoking history. He has never used smokeless tobacco. He reports that he does not drink alcohol and does not use drugs.  ? ?Family History:  The patient's family history includes Diabetes in his maternal grandfather and paternal grandmother; Lung cancer in his father; Stroke in his father and mother.  ? ?ROS:  Please see the history of present illness.   All other systems are  personally reviewed and negative.  ? ?Exam:   ?BP 98/60   Pulse 86   Wt 108.1 kg (238 lb 6.4 oz)   SpO2 95%   BMI 38.48 kg/m?  ?General:  NAD. No resp difficulty ?HEENT: Normal ?Neck: Supple. No JVD. Carotids 2+ bilat; no bruits. No lymphadenopathy or thryomegaly appreciated. ?Cor: PMI nondisplaced. Regular rate & rhythm. No rubs, gallops or murmurs. ?Lungs: Clear ?Abdomen: Obese, soft, nontender, nondistended. No hepatosplenomegaly. No bruits or masses. Good bowel sounds. ?Extremities: No cyanosis, clubbing, rash, edema ?Neuro: Alert & oriented x 3, cranial nerves grossly intact. Moves all 4 extremities w/o difficulty. Affect pleasant. ? ?Recent Labs: ?06/19/2021: B Natriuretic Peptide 235.5; TSH 0.984 ?07/03/2021: ALT 38; Hemoglobin 15.0; Magnesium 2.6; Platelets 229 ?07/15/2021: BUN 13; Creatinine, Ser 1.10; Potassium 3.8; Sodium 139  ?Personally reviewed  ? ?Wt Readings from Last 3 Encounters:  ?07/24/21 108.1 kg (238 lb 6.4 oz)  ?07/17/21 106.1 kg (234 lb)  ?07/03/21 109.5 kg (241 lb 6.4 oz)  ? ?ASSESSMENT AND PLAN: ?1. Chronic systolic CHF: Nonischemic  cardiomyopathy.  RHC in 4/16 showed preserved cardiac index by thermodilution but low by Fick.   Last echo in 8/21 showed EF 30-35%, normal RV, no residual VSD, no definite pulmonic stenosis.  Echo in 3/23 showed EF 30-35%, mild L

## 2021-07-24 NOTE — Progress Notes (Deleted)
?  ?  ? ?ID:  [name], DOB [birth year], MRN [id number]   ?Provider location: [facility] ?Type of Visit: Established patient ? ?PCP:  [name], MD  ?HF Cardiologist:  [name] ?  ?History of Present Illness: ?Kevin Cardenas is a 46 y.o. male who has a history of VSD repair and removal of a pulmonary artery band at age 11, DM, HTN, systolic heart failure, NICM and OSA diagnosed in past but not on CPAP. Has St Jude ICD (placed 01/2015).  ? ?Echo in 3/20 showed EF 25%, mild LV dilation, mildly decreased RV systolic function.  ? ?CPX in 10/20 showed moderate functional limitation primarily due to restrictive lung disease.  I had planned to get PFTs and a high resolution CT chest to followup on lung restriction but was never scheduled.  ? ?At last appointment, he had been off his meds for several months but supposedly had restarted them.  Weight was up 20 lbs and he was volume overloaded.  I stopped Lasix and started torsemide, kept other meds as ordered.  He came to the ER later in 7/21 with a bleeding varicose vein but was noted to be hypotensive with AKI.  He was thought to be over-diuresed.  Torsemide was cut back and Entresto was decreased.  ? ?Echo in 8/21 showed EF 30-35%, no significant residual VSD, normal RV.  ?  ?Admitted 3/23 with a/c respiratory failure with hypoxia secondary to a/c CHF and noncompliance with CPAP for OSA/OHS. PICC line placed. Co-ox okay. He was enrolled in FASTR trial for diuresis. Diuresed 24 lb. FASTR trial discontinued on 03/11. Transitioned to torsemide 20 mg bid (prior home dose). GDMT titrated.  R/LHC on 03/13 with normal coronaries, normal filling pressures and normal CO. Mild pulmonary HTN. Referred to paramedicine. HH PT/OT recommended. Patient declined [place]. ? ?Follow up 3/23 Patient returns for followup of CHF.  Weight is stable.  He feels good, no dyspnea except with heavy exertion.  No dyspnea walking on flat ground.  No  orthopnea/PND.  No lightheadedness.  He is taking all his meds currently.  There was some question about compliance prior to last admission. Using CPAP.  ? ?Today he returns for HF follow up, with paramedicine. Overall feeling fine. He has mild dyspnea walking up stairs. Works in the garden with no breathing difficulty. Denies palpitations,, CP, dizziness, edema, or PND/Orthopnea. Appetite ok. No fever or chills. Weight at home 233 pounds. Taking all medications. Using CPAP. ? ?St. Jude device interrogation (personally reviewed): CoreVue up last couple days but now normalized, 98% biV pacing, no VT. ? ?ECG (personally reviewed): none ordered today ? ?Labs (1/16): K 4.1, creatinine 0.9, TSH normal, LDL 89, HDL 46 ?Labs (05/24/2014): K 4.1 Creatinine 0.97  ?Labs (3/16): BNP 54, K 4.4, creatinine 1.0 ?Labs 08/14/2014: K 4.0 Creatinine 0.93  ?Labs (6/16): digoxin < 0.2 ?Labs (9/16): K 3.7, creatinine 0.93, digoxin < 0.5, HCT 40.4 ?Labs (01/2016): dig 0.5 K 4.1 Creatinine 0.94  ?Labs (09/2017): K 4.3 Creatinine 0.95 ?Labs (3/20): K 4.3, creatinine 1.02, digoxin < 0.2, LDL 79 ?Labs (9/20): K 3.8, creatinine 0.82, digoxin < 0.2 ?Labs (7/21): K 3.8, creatinine 1.0 => 2.34 => 1.08, BNP 81, TSH normal ?Labs (9/21): digoxin 0.5, K 4.3, creatinine 1.12 ?Labs (5/22): digoxin 0.2, K 4, creatinine 1.06 ?Labs (4/23): K 3.8, creatinine 1.10 ?   ?PMH: ?1. VSD repair at 1.  ?2. Type 2 diabetes. ?3. HTN ?4. OSA: Not using CPAP.  ?  5. Chronic systolic CHF: Nonischemic cardiomyopathy.  St Jude ICD.  ?- Echo 2009: EF 35-40% ?- [place] 2009: No angiographic coronary disease.  ?- Echo 9/11: LVEF 20-25%  ?- Echo 11/12: LVEF 30-35%, diffuse hypokinesis and mild LVH. RV systolic mildly reduced. LA mildly dilated. Pulmonic valve with mild stenosis.  ?- Echo 05/12/2014: EF 15-20% Grade IDD, LV moderately dilated.  RV normal. Mild pulmonc stenosis mean peak gradient 14.  ?- Echo 7/16: EF 25-30%, mild LV dilation, mild LVH, trivial pulmonic stenosis with  peak gradient 11 mmHg, mildly decreased RV function with mild RV dilation.   ?- CPX (3/16): RER 1.03, peak VO2 19.9, VE/VCO2 slope 18, mild to moderately impaired functional capacity.  There was moderate to severe mixed restrictive/obstructive pattern on spirometry => exertional hypoxemia, oxygen saturation decreased to 84%.  ?- RHC (4/16): mean RA 9, PA 35/12, mean PCWP 8, CI 1.8 Fick, CI 2.4 thermo.  ?- Echo 02/2106: EF 30-35% Grade IIDD  ?- Echo 3/20: EF 25%, mild LV dilation, diffuse hypokinesis, mildly decreased RV systolic function with mild RV dilation, no definite PS noted.  ?- CPX (10/20): VE/VCO2 16, peak VO2 15.8 (57% predicted), RER 1.08.  Moderate functional limitation primarily due to restrictive lung disease.  Severe restriction on PFTs.  ?- Echo (8/21): EF 30-35%, no significant residual VSD, normal RV.  ?6. Pulmonic stenosis: Mild in past, not significant on 2/20 echo.  ?7. High resolution CT chest in 8/21: No evidence for ILD.  ? ?Current Outpatient Medications  ?Medication Sig Dispense Refill  ? ascorbic acid (VITAMIN C) 500 MG tablet Take 500-1,000 mg by mouth daily.    ? aspirin 81 MG chewable tablet Chew 1 tablet (81 mg total) by mouth daily. 30 tablet 0  ? carvedilol (COREG) 3.125 MG tablet Take 1 tablet (3.125 mg total) by mouth 2 (two) times daily with a meal. 60 tablet 1  ? dapagliflozin propanediol (FARXIGA) 10 MG TABS tablet Take 1 tablet (10 mg total) by mouth daily. 30 tablet 1  ? digoxin (LANOXIN) 0.125 MG tablet Take 1 tablet (125 mcg total) by mouth daily. 90 tablet 2  ? fluticasone furoate-vilanterol (BREO ELLIPTA) 100-25 MCG/ACT AEPB Inhale 1 puff into the lungs daily. 60 each 11  ? potassium chloride SA (KLOR-CON) 20 MEQ tablet Take 1 tablet (20 mEq total) by mouth daily. 90 tablet 2  ? sacubitril-valsartan (ENTRESTO) 49-51 MG Take 1 tablet by mouth 2 (two) times daily. 180 tablet 3  ? simvastatin (ZOCOR) 20 MG tablet Take 1 tablet (20 mg total) by mouth at bedtime. 90 tablet 3  ?  spironolactone (ALDACTONE) 25 MG tablet Take 1 tablet (25 mg total) by mouth daily. 30 tablet 1  ? torsemide (DEMADEX) 20 MG tablet Take 2 tablets (40 mg total) by mouth every morning AND 1 tablet (20 mg total) every evening. 270 tablet 1  ? ?No current facility-administered medications for this encounter.  ? ? ?Allergies:   Metolazone  ? ?Social History:  The patient  reports that he quit smoking about 10 years ago. His smoking use included cigarettes. He has a 4.50 pack-year smoking history. He has never used smokeless tobacco. He reports that he does not drink alcohol and does not use drugs.  ? ?Family History:  The patient's family history includes Diabetes in his maternal grandfather and paternal grandmother; Lung cancer in his father; Stroke in his father and mother.  ? ?ROS:  Please see the history of present illness.   All other systems are  personally reviewed and negative.  ? ?Exam:   ?BP 98/60   Pulse 86   Wt 108.1 kg (238 lb 6.4 oz)   SpO2 95%   BMI 38.48 kg/m?  ?General:  NAD. No resp difficulty ?HEENT: Normal ?Neck: Supple. No JVD. Carotids 2+ bilat; no bruits. No lymphadenopathy or thryomegaly appreciated. ?Cor: PMI nondisplaced. Regular rate & rhythm. No rubs, gallops or murmurs. ?Lungs: Clear ?Abdomen: Obese, soft, nontender, nondistended. No hepatosplenomegaly. No bruits or masses. Good bowel sounds. ?Extremities: No cyanosis, clubbing, rash, edema ?Neuro: Alert & oriented x 3, cranial nerves grossly intact. Moves all 4 extremities w/o difficulty. Affect pleasant. ? ?Recent Labs: ?06/19/2021: B Natriuretic Peptide 235.5; TSH 0.984 ?07/03/2021: ALT 38; Hemoglobin 15.0; Magnesium 2.6; Platelets 229 ?07/15/2021: BUN 13; Creatinine, Ser 1.10; Potassium 3.8; Sodium 139  ?Personally reviewed  ? ?Wt Readings from Last 3 Encounters:  ?07/24/21 108.1 kg (238 lb 6.4 oz)  ?07/17/21 106.1 kg (234 lb)  ?07/03/21 109.5 kg (241 lb 6.4 oz)  ? ?ASSESSMENT AND PLAN: ?1. Chronic systolic CHF: Nonischemic  cardiomyopathy.  RHC in 4/16 showed preserved cardiac index by thermodilution but low by Fick.   Last echo in 8/21 showed EF 30-35%, normal RV, no residual VSD, no definite pulmonic stenosis.  Echo in 3/23 showed EF 30-35%, mild L

## 2021-07-24 NOTE — Research (Signed)
? ?  SITE #  [id number]         SUBJECT #     07       SUBJECT Initials: BNW ? ?30- DAY FOLLOW UP VISIT ? ? ?Date of Follow-up Visit: _12__ / _APR___/ _2023___   at _09__:_20___      _0   Time unknown ?       DD/MMM/YYYY        HH;MM ? ? ?Was there any change in subjects's medications?  _1  YES  _2   NO ?    Update:______ ? ?Did the subject experience any NEW adverse events?  _3  YES  _4   NO ?    Update:____ ? ? ? ? ?COMMENTS:  ?Patient stated that he is doing well.  He has been able to work some.  He is still following instructions and directions from the HF clinic.  He stated that his weight seems to be stable.  ? ? ? ?Form is based on Pages 36 of FASTR Trial  eCRFs    Protocol RCV-0006  ? ? ? ?Outpatient Encounter Medications as of 07/24/2021  ?Medication Sig  ? ascorbic acid (VITAMIN C) 500 MG tablet Take 500-1,000 mg by mouth daily.  ? aspirin 81 MG chewable tablet Chew 1 tablet (81 mg total) by mouth daily.  ? carvedilol (COREG) 3.125 MG tablet Take 1 tablet (3.125 mg total) by mouth 2 (two) times daily with a meal.  ? dapagliflozin propanediol (FARXIGA) 10 MG TABS tablet Take 1 tablet (10 mg total) by mouth daily.  ? digoxin (LANOXIN) 0.125 MG tablet Take 1 tablet (125 mcg total) by mouth daily.  ? fluticasone furoate-vilanterol (BREO ELLIPTA) 100-25 MCG/ACT AEPB Inhale 1 puff into the lungs daily.  ? potassium chloride SA (KLOR-CON) 20 MEQ tablet Take 1 tablet (20 mEq total) by mouth daily.  ? sacubitril-valsartan (ENTRESTO) 49-51 MG Take 1 tablet by mouth 2 (two) times daily.  ? simvastatin (ZOCOR) 20 MG tablet Take 1 tablet (20 mg total) by mouth at bedtime.  ? spironolactone (ALDACTONE) 25 MG tablet Take 1 tablet (25 mg total) by mouth daily.  ? torsemide (DEMADEX) 20 MG tablet Take 2 tablets (40 mg total) by mouth every morning AND 1 tablet (20 mg total) every evening.  ? ?No facility-administered encounter medications on file as of 07/24/2021.  ?  ? ? ? ?   ?         ?     ?

## 2021-07-25 LAB — CBC
Hematocrit: 41.9 % (ref 37.5–51.0)
Hemoglobin: 14.4 g/dL (ref 13.0–17.7)
MCH: 32 pg (ref 26.6–33.0)
MCHC: 34.4 g/dL (ref 31.5–35.7)
MCV: 93 fL (ref 79–97)
Platelets: 182 10*3/uL (ref 150–450)
RBC: 4.5 x10E6/uL (ref 4.14–5.80)
RDW: 12.6 % (ref 11.6–15.4)
WBC: 4.4 10*3/uL (ref 3.4–10.8)

## 2021-07-25 LAB — COMPREHENSIVE METABOLIC PANEL
ALT: 20 IU/L (ref 0–44)
AST: 18 IU/L (ref 0–40)
Albumin/Globulin Ratio: 1.3 (ref 1.2–2.2)
Albumin: 4.2 g/dL (ref 4.0–5.0)
Alkaline Phosphatase: 71 IU/L (ref 44–121)
BUN/Creatinine Ratio: 23 — ABNORMAL HIGH (ref 9–20)
BUN: 26 mg/dL — ABNORMAL HIGH (ref 6–24)
Bilirubin Total: 0.3 mg/dL (ref 0.0–1.2)
CO2: 28 mmol/L (ref 20–29)
Calcium: 9 mg/dL (ref 8.7–10.2)
Chloride: 95 mmol/L — ABNORMAL LOW (ref 96–106)
Creatinine, Ser: 1.14 mg/dL (ref 0.76–1.27)
Globulin, Total: 3.2 g/dL (ref 1.5–4.5)
Glucose: 121 mg/dL — ABNORMAL HIGH (ref 70–99)
Potassium: 3.8 mmol/L (ref 3.5–5.2)
Sodium: 136 mmol/L (ref 134–144)
Total Protein: 7.4 g/dL (ref 6.0–8.5)
eGFR: 80 mL/min/{1.73_m2} (ref >59)

## 2021-07-25 LAB — MAGNESIUM: Magnesium: 2.6 mg/dL — ABNORMAL HIGH (ref 1.6–2.3)

## 2021-07-25 NOTE — Progress Notes (Signed)
FASTR 01-07 patient 30 day lab results

## 2021-07-31 ENCOUNTER — Other Ambulatory Visit (HOSPITAL_COMMUNITY): Payer: Self-pay

## 2021-07-31 ENCOUNTER — Telehealth (HOSPITAL_COMMUNITY): Payer: Self-pay

## 2021-07-31 NOTE — Addendum Note (Signed)
Encounter addended by: Rafael Bihari, FNP on: 07/31/2021 4:23 PM ? Actions taken: Delete clinical note

## 2021-07-31 NOTE — Telephone Encounter (Signed)
Spoke to Kevin Cardenas who reports he is doing well and reports no shortness of breath, dizziness or chest pain. He reports his weight is 234lbs. He denied any swelling. He states he has all of his medications and is taking them daily. We rescheduled our home visit for next week. He was agreeable and knows to reach out if he needs anything. He was grateful for the call. Call complete.  ?

## 2021-08-01 NOTE — Progress Notes (Signed)
Remote ICD transmission.  ? ?

## 2021-08-07 ENCOUNTER — Telehealth (HOSPITAL_COMMUNITY): Payer: Self-pay | Admitting: Licensed Clinical Social Worker

## 2021-08-07 NOTE — Telephone Encounter (Signed)
HF Paramedicine Team Based Care Meeting ? ?HF MD- NA  ?HF NP - Amy Clegg NP-C  ? ?[facility] HF Paramedicine  ?Marylouise Stacks ?Jeris Penta  ?Tammy Sours ? ?Hospital admit within the last 30 days for heart failure? no ? ?Medications concerns? Has good understanding of medications ? ?Transportation issues ? no ? ?Education needs? No- understands when he is gaining weight and what he is doing incorrectly to lead to this ? ?SDOH -no ? ?Eligible for discharge?  Paramedic feels as if pt is ready for DC- will discuss DC after June appt if he is looking good ? ? ?Jorge Ny, LCSW ?Clinical Social Worker ?[facility] ?Desk#: [phone] ?Cell#: [phone] ? ?

## 2021-08-14 ENCOUNTER — Ambulatory Visit (INDEPENDENT_AMBULATORY_CARE_PROVIDER_SITE_OTHER): Payer: 59 | Admitting: Internal Medicine

## 2021-08-14 ENCOUNTER — Encounter: Payer: Self-pay | Admitting: Internal Medicine

## 2021-08-14 VITALS — BP 98/72 | HR 85 | Ht 66.0 in | Wt 241.8 lb

## 2021-08-14 DIAGNOSIS — I5022 Chronic systolic (congestive) heart failure: Secondary | ICD-10-CM | POA: Diagnosis not present

## 2021-08-14 DIAGNOSIS — I5020 Unspecified systolic (congestive) heart failure: Secondary | ICD-10-CM | POA: Diagnosis not present

## 2021-08-14 DIAGNOSIS — I428 Other cardiomyopathies: Secondary | ICD-10-CM | POA: Diagnosis not present

## 2021-08-14 LAB — CUP PACEART INCLINIC DEVICE CHECK
Battery Remaining Longevity: 46 mo
Brady Statistic RV Percent Paced: 0 %
Date Time Interrogation Session: 20230503164209
HighPow Impedance: 81 Ohm
Implantable Lead Implant Date: 20161007
Implantable Lead Location: 753860
Implantable Lead Model: 181
Implantable Lead Serial Number: 331179
Implantable Pulse Generator Implant Date: 20161007
Lead Channel Impedance Value: 425 Ohm
Lead Channel Pacing Threshold Amplitude: 1 V
Lead Channel Pacing Threshold Amplitude: 1 V
Lead Channel Pacing Threshold Pulse Width: 0.5 ms
Lead Channel Pacing Threshold Pulse Width: 0.5 ms
Lead Channel Sensing Intrinsic Amplitude: 11.6 mV
Lead Channel Setting Pacing Amplitude: 2.5 V
Lead Channel Setting Pacing Pulse Width: 0.5 ms
Lead Channel Setting Sensing Sensitivity: 0.5 mV
Pulse Gen Serial Number: 7286706

## 2021-08-14 NOTE — Patient Instructions (Signed)
Medication Instructions:  ?Your physician recommends that you continue on your current medications as directed. Please refer to the Current Medication list given to you today. ? ?*If you need a refill on your cardiac medications before your next appointment, please call your pharmacy* ? ? ?Lab Work: ?None ordered. ? ?If you have labs (blood work) drawn today and your tests are completely normal, you will receive your results only by: ?MyChart Message (if you have MyChart) OR ?A paper copy in the mail ?If you have any lab test that is abnormal or we need to change your treatment, we will call you to review the results. ? ? ?Testing/Procedures: ?None ordered. ? ? ? ?Follow-Up: ?At [facility], you and your health needs are our priority.  As part of our continuing mission to provide you with exceptional heart care, we have created designated Provider Care Teams.  These Care Teams include your primary Cardiologist (physician) and Advanced Practice Providers (APPs -  Physician Assistants and Nurse Practitioners) who all work together to provide you with the care you need, when you need it. ? ?We recommend signing up for the patient portal called "MyChart".  Sign up information is provided on this After Visit Summary.  MyChart is used to connect with patients for Virtual Visits (Telemedicine).  Patients are able to view lab/test results, encounter notes, upcoming appointments, etc.  Non-urgent messages can be sent to your provider as well.   ?To learn more about what you can do with MyChart, go to [email or web address].   ? ?Your next appointment:   ?12 month(s) ? ?The format for your next appointment:   ?In Person ? ?Provider:   ?You will see one of the following Advanced Practice Providers on your designated Care Team:   ?Tommye Standard, PA-C ?Legrand Como "Oda Kilts, PA-C}  ? ? ? ? ?Important Information About Sugar ? ? ? ? ?  ?

## 2021-08-14 NOTE — Progress Notes (Signed)
? ? ? ? ?Patient Care Team: ?Dorna Mai, MD as PCP - General (Family Medicine) ?Larey Dresser, MD as PCP - Advanced Heart Failure (Cardiology) ?Deboraha Sprang, MD as PCP - Electrophysiology (Cardiology) ?Larey Dresser, MD as PCP - Cardiology (Cardiology) ? ? ?HPI ? ?Kevin Cardenas is a 46 y.o. male ?Seen in followup for Abbott ICD implanted 2016 for primary prevention in setting of nonischemic cardiomyopathy with prior VSD repair  ? ?Hospitalized 3/23 with acute on chronic heart failure ?  ?DATE TEST EF   ?2009 Echo   35 %   ?     ?7/16 Echo   20.25 %   ?10/17 Echo 30-35%   ?3/20 Echo  25%   ?8/21 Echo  30-35%   ? ?  ? ?Date Cr K Dig  ?12/16 1.1 4.9    ?10/17 0.94 4.1 <0.5  ?4/23 1.14 3.8 0.3  ? ?Dyspnea is at baseline.  No nocturnal dyspnea but is on CPAP.  Denies edema or chest pain.  No palpitations. ?  ?   ?Past Medical History:  ?Diagnosis Date  ? Chronic systolic CHF (congestive heart failure) ([place])   ? EF 25% by echo 06/2018  ? Diabetes mellitus   ? Hypertension   ? Non-ischemic cardiomyopathy ([place])   ? Obstructive sleep apnea   ? VSD (ventricular septal defect) 1978  ? repaired at age 75  ? ? ?Past Surgical History:  ?Procedure Laterality Date  ? EP IMPLANTABLE DEVICE N/A 01/19/2015  ? Procedure: ICD Implant;  Surgeon: Deboraha Sprang, MD;  Location: [place] [facility];  Service: Cardiovascular;  Laterality: N/A;  ? RIGHT HEART CATHETERIZATION N/A 08/03/2014  ? Procedure: RIGHT HEART CATH;  Surgeon: Larey Dresser, MD;  Location: [facility];  Service: Cardiovascular;  Laterality: N/A;  ? RIGHT/LEFT HEART CATH AND CORONARY ANGIOGRAPHY N/A 06/24/2021  ? Procedure: RIGHT/LEFT HEART CATH AND CORONARY ANGIOGRAPHY;  Surgeon: Larey Dresser, MD;  Location: [place] [facility];  Service: Cardiovascular;  Laterality: N/A;  ? VSD REPAIR    ? ? ?Current Outpatient Medications  ?Medication Sig Dispense Refill  ? ascorbic acid (VITAMIN C) 500 MG tablet Take 500-1,000 mg by mouth daily.    ? aspirin 81 MG  chewable tablet Chew 1 tablet (81 mg total) by mouth daily. 30 tablet 0  ? carvedilol (COREG) 3.125 MG tablet Take 1 tablet (3.125 mg total) by mouth 2 (two) times daily with a meal. 60 tablet 1  ? dapagliflozin propanediol (FARXIGA) 10 MG TABS tablet Take 1 tablet (10 mg total) by mouth daily. 30 tablet 1  ? digoxin (LANOXIN) 0.125 MG tablet Take 1 tablet (125 mcg total) by mouth daily. 90 tablet 2  ? fluticasone furoate-vilanterol (BREO ELLIPTA) 100-25 MCG/ACT AEPB Inhale 1 puff into the lungs daily. 60 each 11  ? potassium chloride SA (KLOR-CON) 20 MEQ tablet Take 1 tablet (20 mEq total) by mouth daily. 90 tablet 2  ? sacubitril-valsartan (ENTRESTO) 49-51 MG Take 1 tablet by mouth 2 (two) times daily. 180 tablet 3  ? simvastatin (ZOCOR) 20 MG tablet Take 1 tablet (20 mg total) by mouth at bedtime. 90 tablet 3  ? spironolactone (ALDACTONE) 25 MG tablet Take 1 tablet (25 mg total) by mouth daily. 30 tablet 1  ? torsemide (DEMADEX) 20 MG tablet Take 2 tablets (40 mg total) by mouth every morning AND 1 tablet (20 mg total) every evening. 270 tablet 1  ? ?No current facility-administered medications for this  visit.  ? ? ?Allergies  ?Allergen Reactions  ? Metolazone Other (See Comments)  ?  "Dried up my kidneys- took only one tablet"  ? ? ? ? ?Review of Systems negative except from HPI and PMH ? ?Physical Exam ?BP 98/72   Pulse 85   Ht _0  (1.676 m)   Wt 241 lb 12.8 oz (109.7 kg)   SpO2 92%   BMI 39.03 kg/m?  ?Well developed and well nourished in no acute distress ?HENT normal ?Neck supple with JVP-flat ?Clear ?Device pocket well healed; without hematoma or erythema.  There is no tethering  ?Regular rate and rhythm, no/ murmur ?Abd-soft with active BS ?No Clubbing cyanosis  edema ?Skin-warm and dry ?A & Oriented  Grossly normal sensory and motor function ? ?ECG IVCD at about 110 ms ? ?Device function is normal. ?Programming changes none  ?See Paceart for details  ? ?Assessment and  Plan ? ?Nonischemic  cardiomyopathy ? ?Congestive heart failure-class III ? ?History of VSD repair ? ?ICD St Jude  ? ?IVCD ? ?Hypotension ? ?Device function is normal. ? ?Not withstanding low blood pressure, no significant symptoms.  We will keep him on his current Entresto 49/51, carvedilol 3.125 Jardiance and spironolactone at 25 ? ?Euvolemic.  Continue him on his Demadex 40/20 ? ? ? ?  ? ?

## 2021-08-15 ENCOUNTER — Telehealth (HOSPITAL_COMMUNITY): Payer: Self-pay

## 2021-08-15 NOTE — Telephone Encounter (Signed)
Spoke to Kevin Cardenas about wanting to come out today for home visit and he states he was not at home and wouldn't be until tomorrow. I advised I am off on Fridays and he agreed for me to reach out on Monday to set something up. I will follow up. Call complete.  ?

## 2021-08-19 ENCOUNTER — Other Ambulatory Visit (HOSPITAL_COMMUNITY): Payer: Self-pay | Admitting: Cardiology

## 2021-08-19 ENCOUNTER — Ambulatory Visit (INDEPENDENT_AMBULATORY_CARE_PROVIDER_SITE_OTHER): Payer: 59

## 2021-08-19 ENCOUNTER — Other Ambulatory Visit (HOSPITAL_COMMUNITY): Payer: Self-pay

## 2021-08-19 DIAGNOSIS — I5022 Chronic systolic (congestive) heart failure: Secondary | ICD-10-CM | POA: Diagnosis not present

## 2021-08-19 DIAGNOSIS — Z95810 Presence of automatic (implantable) cardiac defibrillator: Secondary | ICD-10-CM | POA: Diagnosis not present

## 2021-08-19 MED ORDER — POTASSIUM CHLORIDE CRYS ER 20 MEQ PO TBCR
20.0000 meq | EXTENDED_RELEASE_TABLET | Freq: Every day | ORAL | 2 refills | Status: DC
  Filled 2021-08-19: qty 90, 90d supply, fill #0
  Filled 2021-11-04: qty 90, 90d supply, fill #1

## 2021-08-19 MED ORDER — DIGOXIN 125 MCG PO TABS
125.0000 ug | ORAL_TABLET | Freq: Every day | ORAL | 2 refills | Status: DC
  Filled 2021-08-19: qty 90, 90d supply, fill #0
  Filled 2021-11-04: qty 90, 90d supply, fill #1

## 2021-08-19 MED ORDER — TORSEMIDE 20 MG PO TABS
20.0000 mg | ORAL_TABLET | Freq: Two times a day (BID) | ORAL | 3 refills | Status: DC
  Filled 2021-08-19: qty 180, 90d supply, fill #0

## 2021-08-20 ENCOUNTER — Other Ambulatory Visit (HOSPITAL_COMMUNITY): Payer: Self-pay

## 2021-08-20 ENCOUNTER — Encounter (HOSPITAL_COMMUNITY): Payer: 59

## 2021-08-21 ENCOUNTER — Other Ambulatory Visit (HOSPITAL_COMMUNITY): Payer: Self-pay

## 2021-08-21 NOTE — Progress Notes (Signed)
EPIC Encounter for ICM Monitoring ? ?Patient Name: Kevin Cardenas is a 46 y.o. male ?Date: 08/21/2021 ?Primary Care Physican: Dorna Mai, MD ?Primary Cardiologist: Aundra Dubin ?Electrophysiologist: Caryl Comes ?07/16/2021 Weight: 233-234 lbs (baseline) ?08/21/2021 Weight: 233 lbs ?                                                         ?  ?Spoke with patient and heart Failure questions reviewed.  Pt asymptomatic for fluid accumulation.  Spoke with [name], from [facility], that was visiting during ICM call.  BP 108/64, HF 90  O2 92% RA.  She confirmed patient does not have any leg edema.   ?  ?CorVue thoracic impedance suggesting fluid levels returned to normal with increase in morning Torsemide dosage.  ?  ?Prescribed:  ?Torsemide 20 mg take 1 tablet(s) (20 mg total) by mouth twice a day. ?Potassium 20 mEq take 1 tablet(s) (20 mEq total) by mouth daily ?  ?Labs: ?07/24/2021 Creatinine 1.14, BUN 26, Potassium 3.8, Sodium 136, GFR 80 ?07/15/2021 Creatinine 1.10, BUN 13, Potassium 3.8, Sodium 139, GFR>60 ?07/03/2021 Creatinine 1.23, BUN 27, Potassium 4.4, Sodium 139, GFR >60 ?06/24/2021 Creatinine 0.89, BUN 17, Potassium 3.8, Sodium 135, GFR >60 ?06/23/2021 Creatinine 0.95, BUN 18, Potassium 3.8, Sodium 130, GFR >60  ?06/22/2021 Creatinine 1.05, BUN 21, Potassium 4.2, Sodium 131, GFR >60  ?06/21/2021 Creatinine 0.99, BUN 16, Potassium 3.6, Sodium 137, GFR >60  ?06/20/2021 Creatinine 1.03, BUN 12, Potassium 3.1, Sodium 136, GFR >60  ?06/19/2021 Creatinine 1.26, BUN 17, Potassium 4.1, Sodium 126, GFR >60  ?A complete set of results can be found in Results Review. ?  ?Recommendations:   No changes and encouraged to call if experiencing any fluid symptoms.   ?  ?Follow-up plan: ICM clinic phone appointment on 09/23/2021.   91 day device clinic remote transmission 10/16/2021.   ?  ?EP/Cardiology Office Visits:  11/04/2021 with Dr Aundra Dubin.    Recall 08/09/2022 with Oda Kilts, PA. ?  ?Copy of ICM check sent to Dr. Caryl Comes.    ? ?3  month ICM trend: 08/19/2021. ? ? ? ?12-14 Month ICM trend:  ? ? ? ?Rosalene Billings, RN ?08/21/2021 ?12:58 PM ? ?

## 2021-08-21 NOTE — Progress Notes (Signed)
Paramedicine Encounter ? ? ? Patient ID: Kevin Cardenas, male    DOB: 07-Aug-1975, 46 y.o.   MRN: [id number] ? ?Met with Kevin Cardenas in the home today for paramedicine visit. He reports feeling good with no complaints. He denied chest pain, dizziness, shortness of breath. He has been compliant with his medications. He just called in and picked up his refills yesterday on his own. We reviewed all meds and he was able to tell me what he takes and how often.  ? ?Vitals obtained and confirmed. Sharman Cheek RN at [facility] called during our visit to report his fluid levels look good and he has had no increases in fluid levels lately. She plans to follow up in one month.  ? ?No lower leg edema noted. Abdomen soft non tender. Lungs clear and equal.  ? ?We reviewed appointments and we decided to continue meeting bi-weekly and hopes to graduate at next clinic appointment. He agreed.  ? ?Home visit complete.  ? ?NO REFILLS  ? ? ? ? ? ? ? ?Patient Care Team: ?Dorna Mai, MD as PCP - General (Family Medicine) ?Larey Dresser, MD as PCP - Advanced Heart Failure (Cardiology) ?Deboraha Sprang, MD as PCP - Electrophysiology (Cardiology) ?Larey Dresser, MD as PCP - Cardiology (Cardiology) ? ?Patient Active Problem List  ? Diagnosis Date Noted  ? Acute on chronic systolic and diastolic heart failure, NYHA class 3 ([place]) 06/19/2021  ? HFrEF (heart failure with reduced ejection fraction) ([place]) 11/06/2019  ? AKI (acute kidney injury) ([place]) 11/05/2019  ? Orthostatic hypotension 11/04/2019  ? Class 2 obesity due to excess calories with serious comorbidity in adult 01/16/2016  ? Asthma, stable, mild intermittent 01/15/2016  ? OSA on CPAP 06/21/2014  ? Routine general medical examination at a health care facility 04/18/2014  ? Hyperlipidemia with target LDL less than 100 04/17/2014  ? Non-ischemic cardiomyopathy ([place]) 02/06/2011  ? Systolic CHF, chronic ([place]) 02/06/2011  ? VSD (ventricular septal defect) 02/06/2011  ? HTN  (hypertension) 02/06/2011  ? Prediabetes 01/30/2011  ? ? ?Current Outpatient Medications:  ?  ascorbic acid (VITAMIN C) 500 MG tablet, Take 500-1,000 mg by mouth daily., Disp: , Rfl:  ?  aspirin 81 MG chewable tablet, Chew 1 tablet (81 mg total) by mouth daily., Disp: 30 tablet, Rfl: 0 ?  carvedilol (COREG) 3.125 MG tablet, Take 1 tablet (3.125 mg total) by mouth 2 (two) times daily with a meal., Disp: 60 tablet, Rfl: 1 ?  dapagliflozin propanediol (FARXIGA) 10 MG TABS tablet, Take 1 tablet (10 mg total) by mouth daily., Disp: 30 tablet, Rfl: 1 ?  digoxin (LANOXIN) 0.125 MG tablet, Take 1 tablet (125 mcg total) by mouth daily., Disp: 90 tablet, Rfl: 2 ?  fluticasone furoate-vilanterol (BREO ELLIPTA) 100-25 MCG/ACT AEPB, Inhale 1 puff into the lungs daily., Disp: 60 each, Rfl: 11 ?  potassium chloride SA (KLOR-CON M) 20 MEQ tablet, Take 1 tablet (20 mEq total) by mouth daily., Disp: 90 tablet, Rfl: 2 ?  sacubitril-valsartan (ENTRESTO) 49-51 MG, Take 1 tablet by mouth 2 (two) times daily., Disp: 180 tablet, Rfl: 3 ?  simvastatin (ZOCOR) 20 MG tablet, Take 1 tablet (20 mg total) by mouth at bedtime., Disp: 90 tablet, Rfl: 3 ?  spironolactone (ALDACTONE) 25 MG tablet, Take 1 tablet (25 mg total) by mouth daily., Disp: 30 tablet, Rfl: 1 ?  torsemide (DEMADEX) 20 MG tablet, Take 2 tablets (40 mg total) by mouth every morning AND 1 tablet (20 mg total)  every evening., Disp: 270 tablet, Rfl: 1 ?  torsemide (DEMADEX) 20 MG tablet, Take 1 tablet (20 mg total) by mouth 2 (two) times daily., Disp: 180 tablet, Rfl: 3 ?Allergies  ?Allergen Reactions  ? Metolazone Other (See Comments)  ?  "Dried up my kidneys- took only one tablet"  ? ? ? ?Social History  ? ?Socioeconomic History  ? Marital status: Single  ?  Spouse name: Recently Married   ? Number of children: 0  ? Years of education: Not on file  ? Highest education level: Not on file  ?Occupational History  ? Occupation: Librarian, academic   ?  Comment: Works in [employer].  and help unload trucks.   ?Tobacco Use  ? Smoking status: Former  ?  Packs/day: 0.30  ?  Years: 15.00  ?  Pack years: 4.50  ?  Types: Cigarettes  ?  Quit date: 04/08/2011  ?  Years since quitting: 10.3  ? Smokeless tobacco: Never  ? Tobacco comments:  ?  Smokes depends on who is around him.    ?Substance and Sexual Activity  ? Alcohol use: No  ?  Alcohol/week: 0.0 standard drinks  ?  Comment: Rarely   ? Drug use: No  ? Sexual activity: Not Currently  ?Other Topics Concern  ? Not on file  ?Social History Narrative  ? Caffienated drinks-no  ? Seat belt use often-no  ? Regular Exercise-  ? Smoke alarm in the home-yes  ? Firearms/guns in the home-no  ? History of physical abuse-no  ?   ?   ?   ?   ? ?Social Determinants of Health  ? ?Financial Resource Strain: Not on file  ?Food Insecurity: No Food Insecurity  ? Worried About Charity fundraiser in the Last Year: Never true  ? Ran Out of Food in the Last Year: Never true  ?Transportation Needs: No Transportation Needs  ? Lack of Transportation (Medical): No  ? Lack of Transportation (Non-Medical): No  ?Physical Activity: Not on file  ?Stress: Not on file  ?Social Connections: Not on file  ?Intimate Partner Violence: Not on file  ? ? ?Physical Exam ? ? ? ? ? ?Future Appointments  ?Date Time Provider [place]  ?09/17/2021  9:00 AM [facility] PA/NP [facility] None  ?09/17/2021  9:30 AM [facility] None  ?10/16/2021  7:00 AM [facility] DEVICE REMOTES [facility]  ?11/04/2021  2:00 PM Larey Dresser, MD [facility] None  ?01/15/2022  7:00 AM [facility] DEVICE REMOTES [facility]  ?04/16/2022  7:00 AM [facility] DEVICE REMOTES [facility]  ? ? ? ?ACTION: ?Home visit completed ? ? ? ? ? ? ?

## 2021-08-22 ENCOUNTER — Other Ambulatory Visit (HOSPITAL_COMMUNITY): Payer: Self-pay

## 2021-08-27 ENCOUNTER — Other Ambulatory Visit (HOSPITAL_COMMUNITY): Payer: Self-pay | Admitting: Physician Assistant

## 2021-08-27 ENCOUNTER — Other Ambulatory Visit (HOSPITAL_COMMUNITY): Payer: Self-pay

## 2021-08-28 ENCOUNTER — Other Ambulatory Visit (HOSPITAL_COMMUNITY): Payer: Self-pay

## 2021-08-28 MED ORDER — DAPAGLIFLOZIN PROPANEDIOL 10 MG PO TABS
10.0000 mg | ORAL_TABLET | Freq: Every day | ORAL | 3 refills | Status: DC
  Filled 2021-08-28: qty 90, 90d supply, fill #0

## 2021-09-01 ENCOUNTER — Other Ambulatory Visit: Payer: Self-pay

## 2021-09-01 ENCOUNTER — Inpatient Hospital Stay (HOSPITAL_COMMUNITY)
Admission: EM | Admit: 2021-09-01 | Discharge: 2021-09-10 | DRG: 260 | Disposition: A | Discharge status: Home Health | Payer: 59 | Attending: Family Medicine | Admitting: Family Medicine

## 2021-09-01 ENCOUNTER — Encounter (HOSPITAL_COMMUNITY): Payer: Self-pay | Admitting: Emergency Medicine

## 2021-09-01 ENCOUNTER — Inpatient Hospital Stay: Payer: Self-pay

## 2021-09-01 ENCOUNTER — Emergency Department (HOSPITAL_COMMUNITY): Payer: 59

## 2021-09-01 DIAGNOSIS — Z95810 Presence of automatic (implantable) cardiac defibrillator: Secondary | ICD-10-CM

## 2021-09-01 DIAGNOSIS — A4101 Sepsis due to Methicillin susceptible Staphylococcus aureus: Secondary | ICD-10-CM | POA: Diagnosis present

## 2021-09-01 DIAGNOSIS — R7881 Bacteremia: Secondary | ICD-10-CM

## 2021-09-01 DIAGNOSIS — L03115 Cellulitis of right lower limb: Secondary | ICD-10-CM | POA: Diagnosis present

## 2021-09-01 DIAGNOSIS — L97919 Non-pressure chronic ulcer of unspecified part of right lower leg with unspecified severity: Secondary | ICD-10-CM | POA: Diagnosis present

## 2021-09-01 DIAGNOSIS — N179 Acute kidney failure, unspecified: Secondary | ICD-10-CM | POA: Diagnosis present

## 2021-09-01 DIAGNOSIS — I370 Nonrheumatic pulmonary valve stenosis: Secondary | ICD-10-CM | POA: Diagnosis not present

## 2021-09-01 DIAGNOSIS — B9561 Methicillin susceptible Staphylococcus aureus infection as the cause of diseases classified elsewhere: Secondary | ICD-10-CM

## 2021-09-01 DIAGNOSIS — Z959 Presence of cardiac and vascular implant and graft, unspecified: Secondary | ICD-10-CM

## 2021-09-01 DIAGNOSIS — Q2112 Patent foramen ovale: Secondary | ICD-10-CM

## 2021-09-01 DIAGNOSIS — A419 Sepsis, unspecified organism: Secondary | ICD-10-CM | POA: Diagnosis present

## 2021-09-01 DIAGNOSIS — R6521 Severe sepsis with septic shock: Secondary | ICD-10-CM | POA: Diagnosis present

## 2021-09-01 DIAGNOSIS — Z801 Family history of malignant neoplasm of trachea, bronchus and lung: Secondary | ICD-10-CM

## 2021-09-01 DIAGNOSIS — E119 Type 2 diabetes mellitus without complications: Secondary | ICD-10-CM | POA: Diagnosis present

## 2021-09-01 DIAGNOSIS — K029 Dental caries, unspecified: Secondary | ICD-10-CM | POA: Diagnosis present

## 2021-09-01 DIAGNOSIS — I5020 Unspecified systolic (congestive) heart failure: Secondary | ICD-10-CM | POA: Diagnosis not present

## 2021-09-01 DIAGNOSIS — Z6838 Body mass index (BMI) 38.0-38.9, adult: Secondary | ICD-10-CM

## 2021-09-01 DIAGNOSIS — Z7982 Long term (current) use of aspirin: Secondary | ICD-10-CM

## 2021-09-01 DIAGNOSIS — D696 Thrombocytopenia, unspecified: Secondary | ICD-10-CM | POA: Diagnosis present

## 2021-09-01 DIAGNOSIS — I428 Other cardiomyopathies: Secondary | ICD-10-CM | POA: Diagnosis present

## 2021-09-01 DIAGNOSIS — J984 Other disorders of lung: Secondary | ICD-10-CM | POA: Diagnosis present

## 2021-09-01 DIAGNOSIS — E662 Morbid (severe) obesity with alveolar hypoventilation: Secondary | ICD-10-CM | POA: Diagnosis present

## 2021-09-01 DIAGNOSIS — I330 Acute and subacute infective endocarditis: Secondary | ICD-10-CM | POA: Diagnosis present

## 2021-09-01 DIAGNOSIS — Z79899 Other long term (current) drug therapy: Secondary | ICD-10-CM

## 2021-09-01 DIAGNOSIS — Z823 Family history of stroke: Secondary | ICD-10-CM

## 2021-09-01 DIAGNOSIS — E872 Acidosis, unspecified: Secondary | ICD-10-CM | POA: Diagnosis present

## 2021-09-01 DIAGNOSIS — I272 Pulmonary hypertension, unspecified: Secondary | ICD-10-CM | POA: Diagnosis present

## 2021-09-01 DIAGNOSIS — I110 Hypertensive heart disease with heart failure: Secondary | ICD-10-CM | POA: Diagnosis not present

## 2021-09-01 DIAGNOSIS — K760 Fatty (change of) liver, not elsewhere classified: Secondary | ICD-10-CM | POA: Diagnosis present

## 2021-09-01 DIAGNOSIS — E785 Hyperlipidemia, unspecified: Secondary | ICD-10-CM | POA: Diagnosis present

## 2021-09-01 DIAGNOSIS — R0902 Hypoxemia: Secondary | ICD-10-CM | POA: Diagnosis present

## 2021-09-01 DIAGNOSIS — Z87891 Personal history of nicotine dependence: Secondary | ICD-10-CM

## 2021-09-01 DIAGNOSIS — I4891 Unspecified atrial fibrillation: Secondary | ICD-10-CM | POA: Diagnosis not present

## 2021-09-01 DIAGNOSIS — I509 Heart failure, unspecified: Secondary | ICD-10-CM | POA: Diagnosis not present

## 2021-09-01 DIAGNOSIS — I5043 Acute on chronic combined systolic (congestive) and diastolic (congestive) heart failure: Secondary | ICD-10-CM | POA: Diagnosis not present

## 2021-09-01 DIAGNOSIS — I5023 Acute on chronic systolic (congestive) heart failure: Secondary | ICD-10-CM | POA: Diagnosis present

## 2021-09-01 DIAGNOSIS — R519 Headache, unspecified: Secondary | ICD-10-CM | POA: Diagnosis not present

## 2021-09-01 DIAGNOSIS — G4733 Obstructive sleep apnea (adult) (pediatric): Secondary | ICD-10-CM | POA: Diagnosis present

## 2021-09-01 DIAGNOSIS — T827XXA Infection and inflammatory reaction due to other cardiac and vascular devices, implants and grafts, initial encounter: Secondary | ICD-10-CM | POA: Diagnosis present

## 2021-09-01 DIAGNOSIS — Z888 Allergy status to other drugs, medicaments and biological substances status: Secondary | ICD-10-CM

## 2021-09-01 DIAGNOSIS — Z8774 Personal history of (corrected) congenital malformations of heart and circulatory system: Secondary | ICD-10-CM | POA: Diagnosis not present

## 2021-09-01 DIAGNOSIS — Y831 Surgical operation with implant of artificial internal device as the cause of abnormal reaction of the patient, or of later complication, without mention of misadventure at the time of the procedure: Secondary | ICD-10-CM | POA: Diagnosis present

## 2021-09-01 DIAGNOSIS — Z20822 Contact with and (suspected) exposure to covid-19: Secondary | ICD-10-CM | POA: Diagnosis present

## 2021-09-01 DIAGNOSIS — Z833 Family history of diabetes mellitus: Secondary | ICD-10-CM

## 2021-09-01 HISTORY — DX: Presence of automatic (implantable) cardiac defibrillator: Z95.810

## 2021-09-01 LAB — I-STAT VENOUS BLOOD GAS, ED
Acid-Base Excess: 6 mmol/L — ABNORMAL HIGH (ref 0.0–2.0)
Bicarbonate: 29.3 mmol/L — ABNORMAL HIGH (ref 20.0–28.0)
Calcium, Ion: 1.02 mmol/L — ABNORMAL LOW (ref 1.15–1.40)
HCT: 42 % (ref 39.0–52.0)
Hemoglobin: 14.3 g/dL (ref 13.0–17.0)
O2 Saturation: 82 %
Potassium: 4 mmol/L (ref 3.5–5.1)
Sodium: 135 mmol/L (ref 135–145)
TCO2: 31 mmol/L (ref 22–32)
pCO2, Ven: 39 mmHg — ABNORMAL LOW (ref 44–60)
pH, Ven: 7.484 — ABNORMAL HIGH (ref 7.25–7.43)
pO2, Ven: 43 mmHg (ref 32–45)

## 2021-09-01 LAB — LACTIC ACID, PLASMA
Lactic Acid, Venous: 1.8 mmol/L (ref 0.5–1.9)
Lactic Acid, Venous: 2 mmol/L (ref 0.5–1.9)
Lactic Acid, Venous: 1.2 mmol/L (ref 0.5–1.9)

## 2021-09-01 LAB — CBC
HCT: 43 % (ref 39.0–52.0)
Hemoglobin: 14.2 g/dL (ref 13.0–17.0)
MCH: 31.7 pg (ref 26.0–34.0)
MCHC: 33 g/dL (ref 30.0–36.0)
MCV: 96 fL (ref 80.0–100.0)
Platelets: UNDETERMINED 10*3/uL (ref 150–400)
RBC: 4.48 MIL/uL (ref 4.22–5.81)
RDW: 15.4 % (ref 11.5–15.5)
WBC: 7.9 10*3/uL (ref 4.0–10.5)
nRBC: 0 % (ref 0.0–0.2)

## 2021-09-01 LAB — BRAIN NATRIURETIC PEPTIDE: B Natriuretic Peptide: 130.8 pg/mL — ABNORMAL HIGH (ref 0.0–100.0)

## 2021-09-01 LAB — URINALYSIS, ROUTINE W REFLEX MICROSCOPIC
Bacteria, UA: NONE SEEN
Bilirubin Urine: NEGATIVE
Glucose, UA: NEGATIVE mg/dL
Hgb urine dipstick: NEGATIVE
Ketones, ur: 5 mg/dL — AB
Leukocytes,Ua: NEGATIVE
Nitrite: NEGATIVE
Protein, ur: 30 mg/dL — AB
Specific Gravity, Urine: 1.018 (ref 1.005–1.030)
pH: 5 (ref 5.0–8.0)

## 2021-09-01 LAB — COMPREHENSIVE METABOLIC PANEL
ALT: 20 U/L (ref 0–44)
AST: 23 U/L (ref 15–41)
Albumin: 3.8 g/dL (ref 3.5–5.0)
Alkaline Phosphatase: 57 U/L (ref 38–126)
Anion gap: 9 (ref 5–15)
BUN: 22 mg/dL — ABNORMAL HIGH (ref 6–20)
CO2: 28 mmol/L (ref 22–32)
Calcium: 8.7 mg/dL — ABNORMAL LOW (ref 8.9–10.3)
Chloride: 98 mmol/L (ref 98–111)
Creatinine, Ser: 1.51 mg/dL — ABNORMAL HIGH (ref 0.61–1.24)
GFR, Estimated: 57 mL/min — ABNORMAL LOW (ref >60)
Glucose, Bld: 113 mg/dL — ABNORMAL HIGH (ref 70–99)
Potassium: 4.3 mmol/L (ref 3.5–5.1)
Sodium: 135 mmol/L (ref 135–145)
Total Bilirubin: 1.1 mg/dL (ref 0.3–1.2)
Total Protein: 7.4 g/dL (ref 6.5–8.1)

## 2021-09-01 LAB — GLUCOSE, CAPILLARY
Glucose-Capillary: 128 mg/dL — ABNORMAL HIGH (ref 70–99)
Glucose-Capillary: 142 mg/dL — ABNORMAL HIGH (ref 70–99)
Glucose-Capillary: 125 mg/dL — ABNORMAL HIGH (ref 70–99)

## 2021-09-01 LAB — RESP PANEL BY RT-PCR (FLU A&B, COVID) ARPGX2
Influenza A by PCR: NEGATIVE
Influenza B by PCR: NEGATIVE
SARS Coronavirus 2 by RT PCR: NEGATIVE

## 2021-09-01 LAB — PROCALCITONIN: Procalcitonin: 24.67 ng/mL

## 2021-09-01 LAB — BLOOD GAS, VENOUS
Acid-Base Excess: 2.6 mmol/L — ABNORMAL HIGH (ref 0.0–2.0)
Bicarbonate: 29.7 mmol/L — ABNORMAL HIGH (ref 20.0–28.0)
Drawn by: 3409
O2 Saturation: 92.2 %
Patient temperature: 37
pCO2, Ven: 55 mmHg (ref 44–60)
pH, Ven: 7.34 (ref 7.25–7.43)
pO2, Ven: 65 mmHg — ABNORMAL HIGH (ref 32–45)

## 2021-09-01 LAB — MRSA NEXT GEN BY PCR, NASAL: MRSA by PCR Next Gen: NOT DETECTED

## 2021-09-01 LAB — PROTIME-INR
INR: 1.1 (ref 0.8–1.2)
Prothrombin Time: 14.5 seconds (ref 11.4–15.2)

## 2021-09-01 LAB — TROPONIN I (HIGH SENSITIVITY)
Troponin I (High Sensitivity): 6 ng/L (ref <18)
Troponin I (High Sensitivity): 8 ng/L (ref <18)

## 2021-09-01 LAB — D-DIMER, QUANTITATIVE: D-Dimer, Quant: 2.52 ug/mL-FEU — ABNORMAL HIGH (ref 0.00–0.50)

## 2021-09-01 LAB — APTT: aPTT: 28 seconds (ref 24–36)

## 2021-09-01 MED ORDER — NOREPINEPHRINE 4 MG/250ML-% IV SOLN
0.0000 ug/min | INTRAVENOUS | Status: DC
  Administered 2021-09-01: 2 ug/min via INTRAVENOUS

## 2021-09-01 MED ORDER — VANCOMYCIN HCL 2000 MG/400ML IV SOLN
2000.0000 mg | Freq: Once | INTRAVENOUS | Status: AC
  Administered 2021-09-01: 2000 mg via INTRAVENOUS
  Filled 2021-09-01: qty 400

## 2021-09-01 MED ORDER — VANCOMYCIN HCL 1250 MG/250ML IV SOLN
1250.0000 mg | INTRAVENOUS | Status: DC
  Filled 2021-09-01: qty 250

## 2021-09-01 MED ORDER — LACTATED RINGERS IV SOLN: INTRAVENOUS | Status: AC

## 2021-09-01 MED ORDER — VANCOMYCIN HCL IN DEXTROSE 1-5 GM/200ML-% IV SOLN: 1000.0000 mg | Freq: Once | INTRAVENOUS | Status: DC

## 2021-09-01 MED ORDER — LACTATED RINGERS IV BOLUS (SEPSIS)
1000.0000 mL | Freq: Once | INTRAVENOUS | Status: AC
  Administered 2021-09-01: 1000 mL via INTRAVENOUS

## 2021-09-01 MED ORDER — INSULIN ASPART 100 UNIT/ML IJ SOLN
0.0000 [IU] | INTRAMUSCULAR | Status: DC
  Administered 2021-09-02: 2 [IU] via SUBCUTANEOUS
  Administered 2021-09-02 (×3): 1 [IU] via SUBCUTANEOUS
  Administered 2021-09-02: 2 [IU] via SUBCUTANEOUS
  Administered 2021-09-03 (×3): 1 [IU] via SUBCUTANEOUS

## 2021-09-01 MED ORDER — ORAL CARE MOUTH RINSE
15.0000 mL | Freq: Two times a day (BID) | OROMUCOSAL | Status: DC
  Administered 2021-09-02 – 2021-09-10 (×17): 15 mL via OROMUCOSAL

## 2021-09-01 MED ORDER — SODIUM CHLORIDE 0.9 % IV SOLN
250.0000 mL | INTRAVENOUS | Status: DC
  Administered 2021-09-01 – 2021-09-03 (×3): 250 mL via INTRAVENOUS

## 2021-09-01 MED ORDER — METRONIDAZOLE 500 MG/100ML IV SOLN
500.0000 mg | Freq: Once | INTRAVENOUS | Status: AC
  Administered 2021-09-01: 500 mg via INTRAVENOUS
  Filled 2021-09-01: qty 100

## 2021-09-01 MED ORDER — POLYETHYLENE GLYCOL 3350 17 G PO PACK: 17.0000 g | PACK | Freq: Every day | ORAL | Status: DC | PRN

## 2021-09-01 MED ORDER — DOCUSATE SODIUM 100 MG PO CAPS: 100.0000 mg | ORAL_CAPSULE | Freq: Two times a day (BID) | ORAL | Status: DC | PRN

## 2021-09-01 MED ORDER — ACETAMINOPHEN 500 MG PO TABS
1000.0000 mg | ORAL_TABLET | Freq: Once | ORAL | Status: AC
  Administered 2021-09-01: 1000 mg via ORAL
  Filled 2021-09-01: qty 2

## 2021-09-01 MED ORDER — NOREPINEPHRINE 4 MG/250ML-% IV SOLN
2.0000 ug/min | INTRAVENOUS | Status: DC
  Administered 2021-09-01: 4 ug/min via INTRAVENOUS

## 2021-09-01 MED ORDER — ONDANSETRON HCL 4 MG/2ML IJ SOLN: 4.0000 mg | Freq: Four times a day (QID) | INTRAMUSCULAR | Status: DC | PRN

## 2021-09-01 MED ORDER — SODIUM CHLORIDE 0.9 % IV SOLN
2.0000 g | Freq: Once | INTRAVENOUS | Status: AC
  Administered 2021-09-01: 2 g via INTRAVENOUS
  Filled 2021-09-01: qty 12.5

## 2021-09-01 MED ORDER — ENOXAPARIN SODIUM 40 MG/0.4ML IJ SOSY
40.0000 mg | PREFILLED_SYRINGE | INTRAMUSCULAR | Status: DC
  Administered 2021-09-01 – 2021-09-03 (×3): 40 mg via SUBCUTANEOUS
  Filled 2021-09-01 (×3): qty 0.4

## 2021-09-01 MED ORDER — SODIUM CHLORIDE 0.9 % IV SOLN
2.0000 g | Freq: Three times a day (TID) | INTRAVENOUS | Status: DC
  Administered 2021-09-01 – 2021-09-02 (×2): 2 g via INTRAVENOUS
  Filled 2021-09-01 (×2): qty 12.5

## 2021-09-01 MED ORDER — ACETAMINOPHEN 325 MG PO TABS
650.0000 mg | ORAL_TABLET | ORAL | Status: AC | PRN
  Administered 2021-09-02: 650 mg via ORAL
  Filled 2021-09-01: qty 2

## 2021-09-01 MED ORDER — LACTATED RINGERS IV BOLUS
1000.0000 mL | Freq: Once | INTRAVENOUS | Status: AC
  Administered 2021-09-01: 1000 mL via INTRAVENOUS

## 2021-09-01 NOTE — Progress Notes (Signed)
An USGPIV (ultrasound guided PIV) has been placed for short-term vasopressor infusion. A correctly placed ivWatch must be used when administering Vasopressors. Should this treatment be needed beyond 72 hours, central line access should be obtained.  It will be the responsibility of the bedside nurse to follow best practice to prevent extravasations.  Janett Billow, RN aware.

## 2021-09-01 NOTE — H&P (Signed)
NAME:  Kevin Cardenas, MRN:  [id number], DOB:  09/29/1975, LOS: 0 ADMISSION DATE:  09/01/2021, CONSULTATION DATE:  09/01/21 REFERRING MD:  Jeanell Sparrow - EM, CHIEF COMPLAINT:  hypotension   History of Present Illness:   Kevin Cardenas is a 46 y.o. male with PMH of VSD repair and removal of a pulmonary artery band at age 89, DM, HTN, systolic heart failure, NICM and OSA diagnosed in past but not on CPAP. Has St Jude ICD (placed 01/2015). CPX in 10/20 showed moderate functional limitation primarily due to restrictive lung disease.    Pt. Presented 5/21 to [facility] ED  with sudden onset of chills, dyspnea, no cough.  EMS reports sats were 92% and placed on oxygen at 4 l/m. In the ED he was febrile to 103.1 , Lactate was 2.0, and he was hypotensive into the 78'N systolic . He received 2 L of fluid in the ED, with maintenance LR of 125/hr. He recently, within the week,  had repair of RLE varicose vein that had been bleeding by his PCP.  Suspect this area could be source of infection cellulitis.    Additional ED Labs included  SCr 1.51 - AKI WBC 7.9; LA 2; T 103.1 F; HR 122; RR 24 Troponin 6 BNP pending COVID/Flu - neg CXR 5/21>> Cardiomegaly with mild pulmonary vascular congestion.   Pt was started on Levophed in the ED for his hypotension . He will be admitted to the ICU Until he is hemodynamically stable. PCCM will admit and manage care.  Pertinent  Medical History   Chronic systolic CHF (congestive heart failure) (HCC)       EF 25% by echo 06/2018   Diabetes mellitus     Hypertension     Non-ischemic cardiomyopathy ([place])     Obstructive sleep apnea     VSD (ventricular septal defect) 1978    repaired at age [place] [facility] Events: Including procedures, antibiotic start and stop dates in addition to other pertinent events   5/21 to ED with sudden onset chills, diaphoresis. Associated hypoxia. More hypotensive after receiving fluid boluses. Started on peripheral pressors. PCCM admit    Interim History / Subjective:  More hypotensive after fluids Starting pressors   Objective   Blood pressure (!) 75/46, pulse (!) 122, temperature (!) 103.1 F (39.5 C), temperature source Oral, resp. rate (!) 24, SpO2 95 %.        Intake/Output Summary (Last 24 hours) at 09/01/2021 1622 Last data filed at 09/01/2021 1422 Gross per 24 hour  Intake 100.11 ml  Output --  Net 100.11 ml   There were no vitals filed for this visit.  Examination: General: WDWN adult M  HENT: NCAT pink mm anicteric sclera Lungs: CTAb, even unlabored on [place] Cardiovascular: rr Abdomen: soft round ndnt + bowel sounds  Extremities: distal RLE area of erythema surrounding crusted skin lesion  Neuro: AAOx4 no focal deficit  GU: defer Skin: diaphoretic, very warm   Resolved Hospital Problem list     Assessment & Plan:   Hypotension -- query septic shock, possible RLE cellulitis  -Fever, tachycardic - most c/w sepsis, but no  -worse hypotension after 2L IVF + mIVF -UA, Cxr not revealing No recent sick contacts. Does have RLE cellulitis -- but seems out of proportion for fever, hypotension -could also be intolerant of IVF boluses with CHF hx P -will start peripheral NE for MAP >65 -cont broad abx -MRSA swab -holding further fluid boluses for now  Chronic systolic HF Pulmonary HTN P -check BNP  -ECHO -Hold additional fluids for now --Hold Lasix --Holding evidence-based therapies given hypotension  AKI  P -trend UOP, renal indices  -renal US   OSA CPAP at night  Best Practice (right click and "Reselect all SmartList Selections" daily)   Diet/type: NPO DVT prophylaxis: prophylactic heparin  GI prophylaxis: N/A Lines: N/A Foley:  N/A Code Status:  full code Last date of multidisciplinary goals of care discussion [confirmed CODE STATUS with wife and patient on admission]  Labs   CBC: Recent Labs  Lab 09/01/21 1320 09/01/21 1341  WBC 7.9  --   HGB 14.2 14.3  HCT 43.0 42.0   MCV 96.0  --   PLT PLATELET CLUMPS NOTED ON SMEAR, UNABLE TO ESTIMATE  --     Basic Metabolic Panel: Recent Labs  Lab 09/01/21 1341 09/01/21 1423  NA 135 135  K 4.0 4.3  CL  --  98  CO2  --  28  GLUCOSE  --  113*  BUN  --  22*  CREATININE  --  1.51*  CALCIUM  --  8.7*   GFR: Estimated Creatinine Clearance: 69.8 mL/min (A) (by C-G formula based on SCr of 1.51 mg/dL (H)). Recent Labs  Lab 09/01/21 1320 09/01/21 1331 09/01/21 1423  WBC 7.9  --   --   LATICACIDVEN  --  1.8 2.0*    Liver Function Tests: Recent Labs  Lab 09/01/21 1423  AST 23  ALT 20  ALKPHOS 57  BILITOT 1.1  PROT 7.4  ALBUMIN 3.8   No results for input(s): LIPASE, AMYLASE in the last 168 hours. No results for input(s): AMMONIA in the last 168 hours.  ABG    Component Value Date/Time   PHART 7.266 (L) 06/19/2021 1620   PCO2ART 83.1 (HH) 06/19/2021 1620   PO2ART 79 (L) 06/19/2021 1620   HCO3 29.3 (H) 09/01/2021 1341   TCO2 31 09/01/2021 1341   ACIDBASEDEF 2.0 08/03/2014 1417   O2SAT 82 09/01/2021 1341     Coagulation Profile: Recent Labs  Lab 09/01/21 1320  INR 1.1    Cardiac Enzymes: No results for input(s): CKTOTAL, CKMB, CKMBINDEX, TROPONINI in the last 168 hours.  HbA1C: Hgb A1c MFr Bld  Date/Time Value Ref Range Status  06/20/2021 03:55 AM 6.1 (H) 4.8 - 5.6 % Final    Comment:    (NOTE) Pre diabetes:          5.7%-6.4%  Diabetes:              >6.4%  Glycemic control for   <7.0% adults with diabetes   04/17/2021 10:47 AM 6.4 (H) 4.8 - 5.6 % Final    Comment:             Prediabetes: 5.7 - 6.4          Diabetes: >6.4          Glycemic control for adults with diabetes: <7.0     CBG: No results for input(s): GLUCAP in the last 168 hours.  Review of Systems:   No chest pain exertion.  No orthopnea or PND.  No dyspnea on exertion.  Denies worsening lower extremity swelling.  Comprehensive review systems otherwise negative.  Past Medical History:  He,  has a past  medical history of Chronic systolic CHF (congestive heart failure) ([place]), Diabetes mellitus, Hypertension, Non-ischemic cardiomyopathy ([place]), Obstructive sleep apnea, and VSD (ventricular septal defect) (1978).   Surgical History:   Past Surgical History:  Procedure Laterality Date   EP IMPLANTABLE DEVICE N/A 01/19/2015   Procedure: ICD Implant;  Surgeon: Deboraha Sprang, MD;  Location: [facility];  Service: Cardiovascular;  Laterality: N/A;   RIGHT HEART CATHETERIZATION N/A 08/03/2014   Procedure: RIGHT HEART CATH;  Surgeon: Larey Dresser, MD;  [facility] CATH LAB;  Service: Cardiovascular;  Laterality: N/A;   RIGHT/LEFT HEART CATH AND CORONARY ANGIOGRAPHY N/A 06/24/2021   Procedure: RIGHT/LEFT HEART CATH AND CORONARY ANGIOGRAPHY;  Surgeon: Larey Dresser, MD;  Location: [place];  Service: Cardiovascular;  Laterality: N/A;   VSD REPAIR       Social History:   reports that he quit smoking about 10 years ago. His smoking use included cigarettes. He has a 4.50 pack-year smoking history. He has never used smokeless tobacco. He reports that he does not drink alcohol and does not use drugs.   Family History:  His family history includes Diabetes in his maternal grandfather and paternal grandmother; Lung cancer in his father; Stroke in his father and mother. There is no history of Cancer, Alcohol abuse, Drug abuse, Early death, Heart disease, Hyperlipidemia, Hypertension, or Kidney disease.   Allergies Allergies  Allergen Reactions   Metolazone Other (See Comments)    "Dried up my kidneys- took only one tablet"     Home Medications  Prior to Admission medications   Medication Sig Start Date End Date Taking? Authorizing Provider  ascorbic acid (VITAMIN C) 500 MG tablet Take 500-1,000 mg by mouth daily.    [provider]  aspirin 81 MG chewable tablet Chew 1 tablet (81 mg total) by mouth daily. 04/11/12   Ripley Fraise, MD  carvedilol (COREG) 3.125 MG tablet  Take 1 tablet (3.125 mg total) by mouth 2 (two) times daily with a meal. 06/24/21   Joette Catching, PA-C  dapagliflozin propanediol (FARXIGA) 10 MG TABS tablet Take 1 tablet (10 mg total) by mouth daily. 08/28/21   Joette Catching, PA-C  digoxin (LANOXIN) 0.125 MG tablet Take 1 tablet (125 mcg total) by mouth daily. 08/19/21   Larey Dresser, MD  fluticasone furoate-vilanterol (BREO ELLIPTA) 100-25 MCG/ACT AEPB Inhale 1 puff into the lungs daily. 07/17/21   Dorna Mai, MD  potassium chloride SA (KLOR-CON M) 20 MEQ tablet Take 1 tablet (20 mEq total) by mouth daily. 08/19/21   Larey Dresser, MD  sacubitril-valsartan (ENTRESTO) 49-51 MG Take 1 tablet by mouth 2 (two) times daily. 07/03/21   Larey Dresser, MD  simvastatin (ZOCOR) 20 MG tablet Take 1 tablet (20 mg total) by mouth at bedtime. 03/01/21   Larey Dresser, MD  spironolactone (ALDACTONE) 25 MG tablet Take 1 tablet (25 mg total) by mouth daily. 06/24/21   Joette Catching, PA-C  torsemide (DEMADEX) 20 MG tablet Take 2 tablets (40 mg total) by mouth every morning AND 1 tablet (20 mg total) every evening. Patient not taking: Reported on 08/21/2021 07/16/21   Larey Dresser, MD  torsemide (DEMADEX) 20 MG tablet Take 1 tablet (20 mg total) by mouth 2 (two) times daily. 08/19/21   Larey Dresser, MD     Critical care time:     CRITICAL CARE Performed by: Lanier Clam   Total critical care time: 40 minutes  Critical care time was exclusive of separately billable procedures and treating other patients.  Critical care was necessary to treat or prevent imminent or life-threatening deterioration.  Critical care was time spent personally by me on  the following activities: development of treatment plan with patient and/or surrogate as well as nursing, discussions with consultants, evaluation of patient's response to treatment, examination of patient, obtaining history from patient or surrogate, ordering and performing  treatments and interventions, ordering and review of laboratory studies, ordering and review of radiographic studies, pulse oximetry and re-evaluation of patient's condition.    Lanier Clam, MD See Shea Evans for contact info

## 2021-09-01 NOTE — Progress Notes (Signed)
Referred to bedside for worsening hypotension, now with NE at 10   SBP low 90s, MAP > 65  Pt too encephalopathic to consent. Wife at bedside. Discussed present clinical situation and indication for central line.  Wife would prefer a PICC. Discussed that this service may not be readily available, and that we are actively checking.  Again tried to discuss CVC placement in the event that PICC is not feasible this evening and pressors requirements continue rising-- indications, risks, benefits.  Wife is not sure if she will consent for CVC placement, and states again she would prefer a PICC. She would like time to think about if a CVC would be an acceptable alternative to PICC.   PICC order has been placed. RN has reached out as well, awaiting IV team response RE availability.     Eliseo Gum MSN, AGACNP-BC [place]

## 2021-09-01 NOTE — ED Triage Notes (Signed)
Patient coming from home, complaint of sudden onset of diaphoresis, shortness of breath and weakness. Placed himself on home cpap prior to EMS arrival. 4 L [place] placed by EMS. Given 324 [place] via EMS.

## 2021-09-01 NOTE — Progress Notes (Deleted)
Per Janett Billow, RN USGPIV is not needed at this time as the plan is to place central line once wife gives consent.  New IV team consult will be entered if IV team services are needed.

## 2021-09-01 NOTE — ED Provider Notes (Signed)
[facility] EMERGENCY DEPARTMENT Provider Note   CSN: [id number] Arrival date & time: 09/01/21  1308     History  Chief Complaint  Patient presents with   Shortness of Breath   Weakness    Kevin Cardenas is a 45 y.o. male.  HPI 46 yo  male ho chf, t2dm, acute respiratory faiure, pulmonary , ho vsd s/p repair, htn, osa  on cpap presents today with suddenonset of chills, dyspnea, no cough.  EMS reports sats were 92% and placed on oxygen at 4 l/m.     Home Medications Prior to Admission medications   Medication Sig Start Date End Date Taking? Authorizing Provider  ascorbic acid (VITAMIN C) 500 MG tablet Take 500-1,000 mg by mouth daily.    [provider]  aspirin 81 MG chewable tablet Chew 1 tablet (81 mg total) by mouth daily. 04/11/12   Ripley Fraise, MD  carvedilol (COREG) 3.125 MG tablet Take 1 tablet (3.125 mg total) by mouth 2 (two) times daily with a meal. 06/24/21   Joette Catching, PA-C  dapagliflozin propanediol (FARXIGA) 10 MG TABS tablet Take 1 tablet (10 mg total) by mouth daily. 08/28/21   Joette Catching, PA-C  digoxin (LANOXIN) 0.125 MG tablet Take 1 tablet (125 mcg total) by mouth daily. 08/19/21   Larey Dresser, MD  fluticasone furoate-vilanterol (BREO ELLIPTA) 100-25 MCG/ACT AEPB Inhale 1 puff into the lungs daily. 07/17/21   Dorna Mai, MD  potassium chloride SA (KLOR-CON M) 20 MEQ tablet Take 1 tablet (20 mEq total) by mouth daily. 08/19/21   Larey Dresser, MD  sacubitril-valsartan (ENTRESTO) 49-51 MG Take 1 tablet by mouth 2 (two) times daily. 07/03/21   Larey Dresser, MD  simvastatin (ZOCOR) 20 MG tablet Take 1 tablet (20 mg total) by mouth at bedtime. 03/01/21   Larey Dresser, MD  spironolactone (ALDACTONE) 25 MG tablet Take 1 tablet (25 mg total) by mouth daily. 06/24/21   Joette Catching, PA-C  torsemide (DEMADEX) 20 MG tablet Take 2 tablets (40 mg total) by mouth every morning AND 1 tablet (20 mg  total) every evening. Patient not taking: Reported on 08/21/2021 07/16/21   Larey Dresser, MD  torsemide (DEMADEX) 20 MG tablet Take 1 tablet (20 mg total) by mouth 2 (two) times daily. 08/19/21   Larey Dresser, MD      Allergies    Metolazone    Review of Systems   Review of Systems  All other systems reviewed and are negative.  Physical Exam Updated Vital Signs BP (!) 75/46   Pulse (!) 122   Temp (!) 103.1 F (39.5 C) (Oral)   Resp (!) 24   SpO2 95%  Physical Exam Vitals and nursing note reviewed.  Constitutional:      General: He is not in acute distress.    Appearance: He is well-developed. He is obese.  HENT:     Mouth/Throat:     Mouth: Mucous membranes are moist.  Eyes:     Pupils: Pupils are equal, round, and reactive to light.  Cardiovascular:     Rate and Rhythm: Regular rhythm. Tachycardia present.  Pulmonary:     Effort: Pulmonary effort is normal.     Breath sounds: Examination of the right-middle field reveals decreased breath sounds. Examination of the left-middle field reveals decreased breath sounds. Examination of the right-lower field reveals decreased breath sounds. Examination of the left-lower field reveals decreased breath sounds. Decreased breath sounds present.  Abdominal:     General: Bowel sounds are normal.     Palpations: Abdomen is soft.  Musculoskeletal:        General: Normal range of motion.     Cervical back: Normal range of motion.  Skin:    General: Skin is warm and dry.     Capillary Refill: Capillary refill takes less than 2 seconds.  Neurological:     General: No focal deficit present.     Mental Status: He is alert.  Psychiatric:        Mood and Affect: Mood normal.        Behavior: Behavior normal.    ED Results / Procedures / Treatments   Labs (all labs ordered are listed, but only abnormal results are displayed) Labs Reviewed  LACTIC ACID, PLASMA - Abnormal; Notable for the following components:      Result Value    Lactic Acid, Venous 2.0 (*)    All other components within normal limits  URINALYSIS, ROUTINE W REFLEX MICROSCOPIC - Abnormal; Notable for the following components:   Color, Urine AMBER (*)    APPearance HAZY (*)    Ketones, ur 5 (*)    Protein, ur 30 (*)    All other components within normal limits  COMPREHENSIVE METABOLIC PANEL - Abnormal; Notable for the following components:   Glucose, Bld 113 (*)    BUN 22 (*)    Creatinine, Ser 1.51 (*)    Calcium 8.7 (*)    GFR, Estimated 57 (*)    All other components within normal limits  I-STAT VENOUS BLOOD GAS, ED - Abnormal; Notable for the following components:   pH, Ven 7.484 (*)    pCO2, Ven 39.0 (*)    Bicarbonate 29.3 (*)    Acid-Base Excess 6.0 (*)    Calcium, Ion 1.02 (*)    All other components within normal limits  RESP PANEL BY RT-PCR (FLU A&B, COVID) ARPGX2  CULTURE, BLOOD (ROUTINE X 2)  CULTURE, BLOOD (ROUTINE X 2)  URINE CULTURE  CBC  LACTIC ACID, PLASMA  PROTIME-INR  APTT  BLOOD GAS, VENOUS  BRAIN NATRIURETIC PEPTIDE  TROPONIN I (HIGH SENSITIVITY)  TROPONIN I (HIGH SENSITIVITY)    EKG EKG Interpretation  Date/Time:  Sunday Sep 01 2021 13:25:23 EDT Ventricular Rate:  122 PR Interval:  142 QRS Duration: 143 QT Interval:  334 QTC Calculation: 476 R Axis:   -80 Text Interpretation: Sinus tachycardia Right bundle branch block No significant change since last tracing earlier same day Confirmed by Pattricia Boss [phone]) on 09/01/2021 3:32:15 PM  Radiology DG Chest Port 1 View  Result Date: 09/01/2021 CLINICAL DATA:  Dyspnea EXAM: PORTABLE CHEST 1 VIEW COMPARISON:  06/20/2021 FINDINGS: Previously seen PICC line has been removed. Left-sided cardiac device remains in place. Stable cardiomegaly. Mild pulmonary vascular congestion. No focal airspace consolidation, pleural effusion, or pneumothorax. IMPRESSION: Cardiomegaly with mild pulmonary vascular congestion. Electronically Signed   By: Davina Poke D.O.   On:  09/01/2021 14:15    Procedures .Critical Care Performed by: Pattricia Boss, MD Authorized by: Pattricia Boss, MD   Critical care provider statement:    Critical care time (minutes):  45   Critical care end time:  09/01/2021 3:57 PM   Critical care was necessary to treat or prevent imminent or life-threatening deterioration of the following conditions:  Sepsis   Critical care was time spent personally by me on the following activities:  Development of treatment plan with patient or surrogate, discussions with  consultants, evaluation of patient's response to treatment, examination of patient, ordering and review of laboratory studies, ordering and review of radiographic studies, ordering and performing treatments and interventions, pulse oximetry, re-evaluation of patient's condition and review of old [place] discussed with: admitting provider      Medications Ordered in ED Medications  lactated ringers infusion ( Intravenous New Bag/Given 09/01/21 1350)  vancomycin (VANCOREADY) IVPB 2000 mg/400 mL (2,000 mg Intravenous New Bag/Given 09/01/21 1422)  norepinephrine (LEVOPHED) 75m in 257m(0.016 mg/mL) premix infusion (has no administration in time range)  lactated ringers bolus 1,000 mL (0 mLs Intravenous Stopped 09/01/21 1504)  ceFEPIme (MAXIPIME) 2 g in sodium chloride 0.9 % 100 mL IVPB (0 g Intravenous Stopped 09/01/21 1422)  metroNIDAZOLE (FLAGYL) IVPB 500 mg (0 mg Intravenous Stopped 09/01/21 1504)  acetaminophen (TYLENOL) tablet 1,000 mg (1,000 mg Oral Given 09/01/21 1352)  lactated ringers bolus 1,000 mL (1,000 mLs Intravenous New Bag/Given 09/01/21 1535)    ED Course/ Medical Decision Making/ A&P Clinical Course as of 09/01/21 1601  Sun Sep 01, 2021  1407 CBC CBC reviewed interpreted and within normal limits [DR]  1408 I-Stat venous blood gas, ED(!) Venous blood gas reviewed and interpreted with pH of 7.48 PCO2 of 39 bicarb of 29 [DR]  1503 Chest x-English [place] reviewed interpreted with  increased markings possible patchy infiltrates Radiologist interpretation is cardiomegaly with vascular congestion [DR]  1504 Troponin I (High Sensitivity) [DR]    Clinical Course User Index [DR] RaPattricia BossMD                           Medical Decision Making [age]ear old male history of CHF, pulmonary hypertension, presents today with chills and generalized weakness as well as reports of some hypoxia. Patient with fever of 103 and tachycardic to 117 here blood pressure normal at 110/68 Respiratory rate elevated at 33 Sepsis protocol initiated with broad-spectrum antibiotics started Patient has received one liter of lr and now hypertensive 1-Febrile illness- DDX pulmonary infection, sepsis, urinary tract infection, other infections including intra-abdominal Chest x-Gabriel Conry with increased markings, consider Urine with 0-5 white blood cells doubt acute UTI No other obvious sources of infection on exam 2 tachycardia abnormal EKG-suspect that tachycardia is secondary to febrile illness, ST elevation and depression consistent with his widened QRS and no significant change from prior, no chest pain, awaiting troponin-3:39 PM first troponin has resulted and is 6 3 patient now hypotensive suspect secondary to sepsis.  First lactic is 2, 1 L of saline is in and second liter has been ordered Patient has received broad-spectrum antibiotics.  Placing paged to critical care   Amount and/or Complexity of Data Reviewed Labs: ordered. Decision-making details documented in ED Course. Radiology: ordered. ECG/medicine tests: ordered. Discussion of management or test interpretation with external provider(s): Discussed care with critical care.  Discussed with GrEliseo Gumplan to start Levophed. Critical care will see and evaluate Care also discussed with Dr. YaDarl HouseholderRisk OTC drugs. Prescription drug management.           Final Clinical Impression(s) / ED Diagnoses Final diagnoses:  Sepsis, due  to unspecified organism, unspecified whether acute organ dysfunction present ([facility]   Rx / DC Orders ED Discharge Orders     None         RaPattricia BossMD 09/01/21 1601

## 2021-09-01 NOTE — Progress Notes (Signed)
eLink Physician-Brief Progress Note Patient Name: ZALYN AMEND DOB: 10-Sep-1975 MRN: [id number]   Date of Service  09/01/2021  HPI/Events of Note  Patient needs follow up lactic acid per sepsis protocol. He needs CBG's with SSI (he is NPO), PRN Tylenol orders, CBG with SSI coverage, Glucose containing maintenance iv fluid orders (he is NPO).  eICU Interventions  Orders entered.        Frederik Pear 09/01/2021, 9:29 PM

## 2021-09-01 NOTE — Progress Notes (Addendum)
Levo now at 11.  SBP in LUE consistently ~ 20 points lower than in LLE and RUE.  Spoke to [facility] NP to relay.  Per NP, utilize LLE for BP.   Educated wife again on growing need for CVC given that levo is now going at higher rate than should be w/ PIV.  Wife still did not give consent for CVC.    Spoke w/ IV team RN who will come to bedside to place u/s guided IV for levo for time being. IV watch is being utilized.

## 2021-09-01 NOTE — Progress Notes (Signed)
Janett Billow, RN made aware that PICC nurse is not on campus at this time.  IV team will be glad to place USGPIV if more access is needed but need to consider other central line if central access is required for patient care.

## 2021-09-01 NOTE — Sepsis Progress Note (Signed)
Elink following code sepsis

## 2021-09-01 NOTE — Sepsis Progress Note (Signed)
MD messaged asking for 3rd lactic

## 2021-09-01 NOTE — Progress Notes (Addendum)
Pharmacy Antibiotic Note  Kevin Cardenas is a 46 y.o. male for which pharmacy has been consulted for vancomycin and cefepime dosing for sepsis.  Patient with a history of HF, T2DM, respiratory failure, HTN, OSA, VSD; [employer] ICD implanted 2016. Patient presenting with diaphoresis, shortness of breath and weakness.  SCr 1.51 - AKI WBC 7.9; LA 2; T 103.1 F; HR 122; RR 24 COVID/Flu - neg  Plan: Metronidazole per MD Cefepime 2g q8hr Vancomycin 2000 mg once then 1250 mg q24hr (eAUC 469.5) unless change in renal function Trend WBC, Fever, Renal function, & Clinical course F/u cultures, clinical course, WBC, fever De-escalate when able     Temp (24hrs), Avg:103 F (39.4 C), Min:103 F (39.4 C), Max:103 F (39.4 C)  No results for input(s): WBC, CREATININE, LATICACIDVEN, VANCOTROUGH, VANCOPEAK, VANCORANDOM, GENTTROUGH, GENTPEAK, GENTRANDOM, TOBRATROUGH, TOBRAPEAK, TOBRARND, AMIKACINPEAK, AMIKACINTROU, AMIKACIN in the last 168 hours.  CrCl cannot be calculated (Patient's most recent lab result is older than the maximum 21 days allowed.).    Allergies  Allergen Reactions   Metolazone Other (See Comments)    "Dried up my kidneys- took only one tablet"    Antimicrobials this admission: cefepime 5/21 >>  metronidazole 5/21 >>  vancomycin 5/21 >>   Microbiology results: Pending  Thank you for allowing pharmacy to be a part of this patient's care.  Lorelei Pont, PharmD, BCPS 09/01/2021 1:26 PM ED Clinical Pharmacist -  [phone]

## 2021-09-02 ENCOUNTER — Inpatient Hospital Stay (HOSPITAL_COMMUNITY): Payer: 59

## 2021-09-02 DIAGNOSIS — A419 Sepsis, unspecified organism: Secondary | ICD-10-CM | POA: Diagnosis not present

## 2021-09-02 DIAGNOSIS — I5043 Acute on chronic combined systolic (congestive) and diastolic (congestive) heart failure: Secondary | ICD-10-CM

## 2021-09-02 DIAGNOSIS — R6521 Severe sepsis with septic shock: Secondary | ICD-10-CM | POA: Diagnosis not present

## 2021-09-02 LAB — BASIC METABOLIC PANEL
Anion gap: 8 (ref 5–15)
BUN: 20 mg/dL (ref 6–20)
CO2: 27 mmol/L (ref 22–32)
Calcium: 8.3 mg/dL — ABNORMAL LOW (ref 8.9–10.3)
Chloride: 102 mmol/L (ref 98–111)
Creatinine, Ser: 1.3 mg/dL — ABNORMAL HIGH (ref 0.61–1.24)
GFR, Estimated: >60 mL/min (ref >60)
Glucose, Bld: 125 mg/dL — ABNORMAL HIGH (ref 70–99)
Potassium: 4.2 mmol/L (ref 3.5–5.1)
Sodium: 137 mmol/L (ref 135–145)

## 2021-09-02 LAB — BLOOD CULTURE ID PANEL (REFLEXED) - BCID2

## 2021-09-02 LAB — ECHOCARDIOGRAM COMPLETE
AR max vel: 3.82 cm2
AV Area VTI: 4.05 cm2
AV Area mean vel: 3.69 cm2
AV Mean grad: 5 mmHg
AV Peak grad: 7.3 mmHg
Ao pk vel: 1.35 m/s
Area-P 1/2: 9.25 cm2
Calc EF: 57.9 %
S' Lateral: 3.75 cm
Single Plane A2C EF: 54.7 %
Single Plane A4C EF: 60.3 %
Weight: 3837.77 oz

## 2021-09-02 LAB — CBC WITH DIFFERENTIAL/PLATELET
Abs Immature Granulocytes: 0.05 10*3/uL (ref 0.00–0.07)
Basophils Absolute: 0 10*3/uL (ref 0.0–0.1)
Basophils Relative: 0 %
Eosinophils Absolute: 0 10*3/uL (ref 0.0–0.5)
Eosinophils Relative: 0 %
HCT: 41 % (ref 39.0–52.0)
Hemoglobin: 13.2 g/dL (ref 13.0–17.0)
Immature Granulocytes: 0 %
Lymphocytes Relative: 3 %
Lymphs Abs: 0.3 10*3/uL — ABNORMAL LOW (ref 0.7–4.0)
MCH: 31.5 pg (ref 26.0–34.0)
MCHC: 32.2 g/dL (ref 30.0–36.0)
MCV: 97.9 fL (ref 80.0–100.0)
Monocytes Absolute: 0.4 10*3/uL (ref 0.1–1.0)
Monocytes Relative: 3 %
Neutro Abs: 11.2 10*3/uL — ABNORMAL HIGH (ref 1.7–7.7)
Neutrophils Relative %: 94 %
Platelets: 120 10*3/uL — ABNORMAL LOW (ref 150–400)
RBC: 4.19 MIL/uL — ABNORMAL LOW (ref 4.22–5.81)
RDW: 14.9 % (ref 11.5–15.5)
WBC: 12 10*3/uL — ABNORMAL HIGH (ref 4.0–10.5)
nRBC: 0 % (ref 0.0–0.2)

## 2021-09-02 LAB — URINE CULTURE: Culture: NO GROWTH

## 2021-09-02 LAB — GLUCOSE, CAPILLARY
Glucose-Capillary: 115 mg/dL — ABNORMAL HIGH (ref 70–99)
Glucose-Capillary: 117 mg/dL — ABNORMAL HIGH (ref 70–99)
Glucose-Capillary: 156 mg/dL — ABNORMAL HIGH (ref 70–99)
Glucose-Capillary: 138 mg/dL — ABNORMAL HIGH (ref 70–99)
Glucose-Capillary: 154 mg/dL — ABNORMAL HIGH (ref 70–99)
Glucose-Capillary: 147 mg/dL — ABNORMAL HIGH (ref 70–99)

## 2021-09-02 LAB — BRAIN NATRIURETIC PEPTIDE: B Natriuretic Peptide: 231.9 pg/mL — ABNORMAL HIGH (ref 0.0–100.0)

## 2021-09-02 LAB — PHOSPHORUS: Phosphorus: 2.9 mg/dL (ref 2.5–4.6)

## 2021-09-02 LAB — MAGNESIUM: Magnesium: 2.1 mg/dL (ref 1.7–2.4)

## 2021-09-02 MED ORDER — CEFAZOLIN SODIUM-DEXTROSE 2-4 GM/100ML-% IV SOLN
2.0000 g | Freq: Three times a day (TID) | INTRAVENOUS | Status: DC
Start: 2021-09-02 — End: 2021-09-10
  Administered 2021-09-02 – 2021-09-10 (×25): 2 g via INTRAVENOUS
  Filled 2021-09-02 (×28): qty 100

## 2021-09-02 MED ORDER — FUROSEMIDE 10 MG/ML IJ SOLN
80.0000 mg | Freq: Once | INTRAMUSCULAR | Status: AC
  Administered 2021-09-02: 80 mg via INTRAVENOUS
  Filled 2021-09-02: qty 8

## 2021-09-02 MED ORDER — SIMVASTATIN 20 MG PO TABS
20.0000 mg | ORAL_TABLET | Freq: Every day | ORAL | Status: DC
  Administered 2021-09-02 – 2021-09-09 (×8): 20 mg via ORAL
  Filled 2021-09-02 (×9): qty 1

## 2021-09-02 MED ORDER — ARFORMOTEROL TARTRATE 15 MCG/2ML IN NEBU
15.0000 ug | INHALATION_SOLUTION | Freq: Two times a day (BID) | RESPIRATORY_TRACT | Status: DC
  Administered 2021-09-02 – 2021-09-10 (×16): 15 ug via RESPIRATORY_TRACT
  Filled 2021-09-02 (×17): qty 2

## 2021-09-02 MED ORDER — PERFLUTREN LIPID MICROSPHERE
1.0000 mL | INTRAVENOUS | Status: AC | PRN
  Administered 2021-09-02: 4 mL via INTRAVENOUS

## 2021-09-02 MED ORDER — ASPIRIN 81 MG PO CHEW
81.0000 mg | CHEWABLE_TABLET | Freq: Every day | ORAL | Status: DC
  Administered 2021-09-02 – 2021-09-03 (×2): 81 mg via ORAL
  Filled 2021-09-02 (×2): qty 1

## 2021-09-02 MED ORDER — BUDESONIDE 0.25 MG/2ML IN SUSP
0.2500 mg | Freq: Two times a day (BID) | RESPIRATORY_TRACT | Status: DC
  Administered 2021-09-02 – 2021-09-10 (×16): 0.25 mg via RESPIRATORY_TRACT
  Filled 2021-09-02 (×17): qty 2

## 2021-09-02 MED ORDER — CHLORHEXIDINE GLUCONATE CLOTH 2 % EX PADS
6.0000 | MEDICATED_PAD | Freq: Every day | CUTANEOUS | Status: DC
  Administered 2021-09-02 – 2021-09-10 (×8): 6 via TOPICAL

## 2021-09-02 MED ORDER — MIDODRINE HCL 5 MG PO TABS
2.5000 mg | ORAL_TABLET | Freq: Three times a day (TID) | ORAL | Status: DC
  Administered 2021-09-02 (×2): 2.5 mg via ORAL
  Filled 2021-09-02 (×2): qty 1

## 2021-09-02 MED ORDER — IPRATROPIUM-ALBUTEROL 0.5-2.5 (3) MG/3ML IN SOLN
3.0000 mL | Freq: Four times a day (QID) | RESPIRATORY_TRACT | Status: DC | PRN
  Administered 2021-09-03: 3 mL via RESPIRATORY_TRACT
  Filled 2021-09-02: qty 3

## 2021-09-02 NOTE — Progress Notes (Signed)
PHARMACY - PHYSICIAN COMMUNICATION CRITICAL VALUE ALERT - BLOOD CULTURE IDENTIFICATION (BCID)  Kevin Cardenas is an 46 y.o. male who presented to [facility] on 09/01/2021 with a chief complaint of onset of chills, dyspnea.   Assessment:   5/21 blood cultures: 2/4 bottles growing staph aureus (no mecA detected)  Name of physician (or Provider) Contacted: Dr. Lucile Shutters  Current antibiotics: vancomycin, cefepime  Changes to prescribed antibiotics recommended:  Patient is on recommended antibiotics - No changes needed  Results for orders placed or performed during the hospital encounter of 09/01/21  Blood Culture ID Panel (Reflexed) (Collected: 09/01/2021  1:24 PM)  Result Value Ref Range   Enterococcus faecalis NOT DETECTED NOT DETECTED   Enterococcus Faecium NOT DETECTED NOT DETECTED   Listeria monocytogenes NOT DETECTED NOT DETECTED   Staphylococcus species DETECTED (A) NOT DETECTED   Staphylococcus aureus (BCID) DETECTED (A) NOT DETECTED   Staphylococcus epidermidis NOT DETECTED NOT DETECTED   Staphylococcus lugdunensis NOT DETECTED NOT DETECTED   Streptococcus species NOT DETECTED NOT DETECTED   Streptococcus agalactiae NOT DETECTED NOT DETECTED   Streptococcus pneumoniae NOT DETECTED NOT DETECTED   Streptococcus pyogenes NOT DETECTED NOT DETECTED   A.calcoaceticus-baumannii NOT DETECTED NOT DETECTED   Bacteroides fragilis NOT DETECTED NOT DETECTED   Enterobacterales NOT DETECTED NOT DETECTED   Enterobacter cloacae complex NOT DETECTED NOT DETECTED   Escherichia coli NOT DETECTED NOT DETECTED   Klebsiella aerogenes NOT DETECTED NOT DETECTED   Klebsiella oxytoca NOT DETECTED NOT DETECTED   Klebsiella pneumoniae NOT DETECTED NOT DETECTED   Proteus species NOT DETECTED NOT DETECTED   Salmonella species NOT DETECTED NOT DETECTED   Serratia marcescens NOT DETECTED NOT DETECTED   Haemophilus influenzae NOT DETECTED NOT DETECTED   Neisseria meningitidis NOT DETECTED NOT DETECTED    Pseudomonas aeruginosa NOT DETECTED NOT DETECTED   Stenotrophomonas maltophilia NOT DETECTED NOT DETECTED   Candida albicans NOT DETECTED NOT DETECTED   Candida auris NOT DETECTED NOT DETECTED   Candida glabrata NOT DETECTED NOT DETECTED   Candida krusei NOT DETECTED NOT DETECTED   Candida parapsilosis NOT DETECTED NOT DETECTED   Candida tropicalis NOT DETECTED NOT DETECTED   Cryptococcus neoformans/gattii NOT DETECTED NOT DETECTED   Meth resistant mecA/C and MREJ NOT DETECTED NOT DETECTED    Thank you for allowing pharmacy to be a part of this patient's care.  Ardyth Harps, PharmD Clinical Pharmacist

## 2021-09-02 NOTE — Consult Note (Addendum)
Advanced Heart Failure Team Consult Note   Primary Physician: Dorna Mai, MD PCP-Cardiologist:  [name], MD  Reason for Consultation: acute on chronic systolic heart failure, bacteremia w/ ICD in place   HPI:    Kevin Cardenas is seen today for evaluation of acute on chronic systolic heart failure and bacteremia w/ ICD in place at the request of Dr. Silas Flood, PCCM.   Kevin Cardenas is a 46 y.o. male who has a history of VSD repair and removal of a pulmonary artery band at age 45, DM, HTN, systolic heart failure, NICM and OSA diagnosed in past but not on CPAP. Has St Jude ICD (placed 01/2015).    Echo in 3/20 showed EF 25%, mild LV dilation, mildly decreased RV systolic function.    CPX in 10/20 showed moderate functional limitation primarily due to restrictive lung disease.  I had planned to get PFTs and a high resolution CT chest to followup on lung restriction but was never scheduled.    At last appointment, he had been off his meds for several months but supposedly had restarted them.  Weight was up 20 lbs and he was volume overloaded.  I stopped Lasix and started torsemide, kept other meds as ordered.  He came to the ER later in 7/21 with a bleeding varicose vein but was noted to be hypotensive with AKI.  He was thought to be over-diuresed.  Torsemide was cut back and Entresto was decreased.    Echo in 8/21 showed EF 30-35%, no significant residual VSD, normal RV.    Admitted 3/23 with a/c respiratory failure with hypoxia secondary to a/c CHF and noncompliance with CPAP for OSA/OHS. PICC line placed. Co-ox okay. He was enrolled in FASTR trial for diuresis. Diuresed 24 lb. FASTR trial discontinued on 03/11. Transitioned to torsemide 20 mg bid (prior home dose). GDMT titrated.  R/LHC on 03/13 with normal coronaries, normal filling pressures and normal CO. Mild pulmonary HTN. Referred to paramedicine. HH PT/OT recommended. Patient declined [facility]. CardioMEMs was also  considered but pt declined.   Seen for post hospital f/u 4/23. Was stable, NYHA Class II. Euvolemic on exam and by [facility]. No med changes. Advised to f/u in 3 months.   Presented to ED via EMS on 5/21 w/ fever, chills and hypotension. Found to be in septic shock. He was febrile w/ temp of 103.1 , Lactate was 2.0 and he was hypotensive into the 65'L systolic. + mild AKI w/ SCr elevated at 1.5 (BL ~1.1). Source fel to be due to Rt LE cellulitis. He recently, in the last week, had repair of RLE varicose vein that had been bleeding by his PCP. He was started on broad spectrum abx, IVF and NE. Admitted to ICU.   PCT returned elevated at 24.67. BCx 5/21 + for Staph Aureus   Echo this admit shows EF 35% w/ global HK, No residual VSD, RV normal w/ mildly elevated RVSP, 36 mmHG. Trival MR/TR. AoV not well visualized. No vegetations noted. IVC mildly dilated in size with >50% respiratory variability, suggesting right atrial pressure of 8 mmHg.  BNP mildly elevated at 231. CXR w/ cardiomegaly and mild pulmonary vascular congestion.    AHF team consulted per family request.   Today, he c/w fevers, most recent temp 100.3. WBC 12K. LA 2.0>>1.8. Abx narrowed from vanc  + cefepime>>Ancef. Now off NE w/ stable BP.  Net + 1.5L.   SCr trending down, 1.3 today   He denies resting dyspnea.  No orthopnea/PND. C/w NYHA Class II symptoms. Denies CP.    Echo 09/02/21 1. Left ventricular ejection fraction, by estimation, is 35%. The left  ventricle has moderately decreased function. The left ventricle  demonstrates global hypokinesis. Left ventricular diastolic parameters are  indeterminate. No residual VSD visualized  by color doppler.   2. Right ventricular systolic function is normal. The right ventricular  size is moderately enlarged. There is mildly elevated pulmonary artery  systolic pressure. The estimated right ventricular systolic pressure is  53.9 mmHg.   3. The mitral valve is normal in structure.  Trivial mitral valve  regurgitation.   4. The aortic valve is tricuspid. Aortic valve regurgitation is not  visualized.   5. The inferior vena cava is dilated in size with >50% respiratory  variability, suggesting right atrial pressure of 8 mmHg.   6. No significant pulmonic stenosis. Peak gradient 51mHg.   Review of Systems: [y] = yes, _0  = no   General: Weight gain _1 ; Weight loss _2 ; Anorexia _3 ; Fatigue [ Y]; Fever [Y ]; Chills [Y ]; Weakness [ Y]  Cardiac: Chest pain/pressure _4 ; Resting SOB _5 ; Exertional SOB [ Y]; Orthopnea _6 ; Pedal Edema _7 ; Palpitations _8 ; Syncope _9 ; Presyncope _10 ; Paroxysmal nocturnal dyspnea_11   Pulmonary: Cough _12 ; Wheezing_13 ; Hemoptysis_14 ; Sputum _15 ; Snoring _16   GI: Vomiting_17 ; Dysphagia_18 ; Melena_19 ; Hematochezia _20 ; Heartburn_21 ; Abdominal pain _22 ; Constipation _23 ; Diarrhea _24 ; BRBPR _25   GU: Hematuria_26 ; Dysuria _27 ; Nocturia_28   Vascular: Pain in legs with walking _29 ; Pain in feet with lying flat _30 ; Non-healing sores _31 ; Stroke _32 ; TIA _33 ; Slurred speech _34 ;  Neuro: Headaches_35 ; Vertigo_36 ; Seizures_37 ; Paresthesias_38 ;Blurred vision _39 ; Diplopia _40 ; Vision changes _41   Ortho/Skin: Arthritis _42 ; Joint pain _43 ; Muscle pain _44 ; Joint swelling _45 ; Back Pain _46 ; Rash _47   Psych: Depression_48 ; Anxiety_49   Heme: Bleeding problems _50 ; Clotting disorders _51 ; Anemia _52   Endocrine: Diabetes _53 ; Thyroid dysfunction_54   Home Medications Prior to Admission medications   Medication Sig Start Date End Date Taking? Authorizing Provider  ascorbic acid (VITAMIN C) 500 MG tablet Take 500-1,000 mg by mouth daily.    [provider]  aspirin 81 MG chewable tablet Chew 1 tablet (81 mg total) by mouth daily. 04/11/12   WRipley Fraise MD  carvedilol (COREG) 3.125 MG tablet Take 1 tablet (3.125 mg total) by mouth 2 (two) times daily with a meal. 06/24/21   FJoette Catching PA-C  dapagliflozin propanediol (FARXIGA) 10  MG TABS tablet Take 1 tablet (10 mg total) by mouth daily. 08/28/21   FJoette Catching PA-C  digoxin (LANOXIN) 0.125 MG tablet Take 1 tablet (125 mcg total) by mouth daily. 08/19/21   MLarey Dresser MD  fluticasone furoate-vilanterol (BREO ELLIPTA) 100-25 MCG/ACT AEPB Inhale 1 puff into the lungs daily. 07/17/21   WDorna Mai MD  potassium chloride SA (KLOR-CON M) 20 MEQ tablet Take 1 tablet (20 mEq total) by mouth daily. 08/19/21   MLarey Dresser MD  sacubitril-valsartan (ENTRESTO) 49-51 MG Take 1 tablet by mouth 2 (two) times daily. 07/03/21   MLarey Dresser MD  simvastatin (ZOCOR) 20 MG tablet Take 1 tablet (20 mg total) by mouth at bedtime. 03/01/21   MLarey Dresser  MD  spironolactone (ALDACTONE) 25 MG tablet Take 1 tablet (25 mg total) by mouth daily. 06/24/21   Joette Catching, PA-C  torsemide (DEMADEX) 20 MG tablet Take 2 tablets (40 mg total) by mouth every morning AND 1 tablet (20 mg total) every evening. Patient not taking: Reported on 08/21/2021 07/16/21   Larey Dresser, MD  torsemide (DEMADEX) 20 MG tablet Take 1 tablet (20 mg total) by mouth 2 (two) times daily. 08/19/21   Larey Dresser, MD    Past Medical History: Past Medical History:  Diagnosis Date   Chronic systolic CHF (congestive heart failure) ([place])    EF 25% by echo 06/2018   Diabetes mellitus    Hypertension    Non-ischemic cardiomyopathy ([place])    Obstructive sleep apnea    VSD (ventricular septal defect) 1978   repaired at age 43    Past Surgical History: Past Surgical History:  Procedure Laterality Date   EP IMPLANTABLE DEVICE N/A 01/19/2015   Procedure: ICD Implant;  Surgeon: Deboraha Sprang, MD;  Location: [place] [facility];  Service: Cardiovascular;  Laterality: N/A;   RIGHT HEART CATHETERIZATION N/A 08/03/2014   Procedure: RIGHT HEART CATH;  Surgeon: Larey Dresser, MD;  Location: [facility] CATH [facility];  Service: Cardiovascular;  Laterality: N/A;   RIGHT/LEFT HEART CATH AND CORONARY ANGIOGRAPHY N/A  06/24/2021   Procedure: RIGHT/LEFT HEART CATH AND CORONARY ANGIOGRAPHY;  Surgeon: Larey Dresser, MD;  Location: [place] [facility];  Service: Cardiovascular;  Laterality: N/A;   VSD REPAIR      Family History: Family History  Problem Relation Age of Onset   Lung cancer Father    Stroke Father    Stroke Mother    Diabetes Maternal Grandfather    Diabetes Paternal Grandmother    Cancer Neg Hx    Alcohol abuse Neg Hx    Drug abuse Neg Hx    Early death Neg Hx    Heart disease Neg Hx    Hyperlipidemia Neg Hx    Hypertension Neg Hx    Kidney disease Neg Hx     Social History: Social History   Socioeconomic History   Marital status: Single    Spouse name: Recently Married    Number of children: 0   Years of education: Not on file   Highest education level: Not on file  Occupational History   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   Tobacco Use   Smoking status: Former    Packs/day: 0.30    Years: 15.00    Pack years: 4.50    Types: Cigarettes    Quit date: 04/08/2011    Years since quitting: 10.4   Smokeless tobacco: Never   Tobacco comments:    Smokes depends on who is around him.    Substance and Sexual Activity   Alcohol use: No    Alcohol/week: 0.0 standard drinks    Comment: Rarely    Drug use: No   Sexual activity: Not Currently  Other Topics Concern   Not on file  Social History Narrative   Caffienated drinks-no   Seat belt use often-no   Regular Exercise-   Smoke alarm in the home-yes   Firearms/guns in the home-no   History of physical abuse-no               Social Determinants of Health   Financial Resource Strain: Not on file  Food Insecurity: No  Food Insecurity   Worried About Charity fundraiser in the Last Year: Never true   Ran Out of Food in the Last Year: Never true  Transportation Needs: No Transportation Needs   Lack of Transportation (Medical): No   Lack of Transportation  (Non-Medical): No  Physical Activity: Not on file  Stress: Not on file  Social Connections: Not on file    Allergies:  Allergies  Allergen Reactions   Metolazone Other (See Comments)    "Dried up my kidneys- took only one tablet"    Objective:    Vital Signs:   Temp:  [98.1 F (36.7 C)-103.1 F (39.5 C)] 99.7 F (37.6 C) (05/22 0801) Pulse Rate:  [84-189] 106 (05/22 1000) Resp:  [13-36] 18 (05/22 0900) BP: (54-143)/(36-97) 143/97 (05/22 1000) SpO2:  [84 %-99 %] 95 % (05/22 1111) Weight:  [108.8 kg] 108.8 kg (05/22 0500) Last BM Date : 09/02/21  Weight change: Filed Weights   09/02/21 0500  Weight: 108.8 kg    Intake/Output:   Intake/Output Summary (Last 24 hours) at 09/02/2021 1125 Last data filed at 09/02/2021 1000 Gross per 24 hour  Intake 1967.7 ml  Output 500 ml  Net 1467.7 ml      Physical Exam    General:  mildly diaphoretic obese. No resp difficulty HEENT: normal Neck: supple. Thick neck, JVD not well visualized . Carotids 2+ bilat; no bruits. No lymphadenopathy or thyromegaly appreciated. Cor: PMI nondisplaced. Regular rate & rhythm. 3/6 murmur R/LUSB, loudest ar LUSB Lungs: clear Abdomen: obese, soft, nontender, nondistended. No hepatosplenomegaly. No bruits or masses. Good bowel sounds. Extremities: no cyanosis, clubbing, rash, trace b/l LE edema + warm distal extremities  Neuro: alert & orientedx3, cranial nerves grossly intact. moves all 4 extremities w/o difficulty. Affect pleasant   Telemetry   NSR 90s   EKG    Sinus tach 106 bpm, LAFB personally reviewed   Labs   Basic Metabolic Panel: Recent Labs  Lab 09/01/21 1341 09/01/21 1423 09/02/21 0418  NA 135 135 137  K 4.0 4.3 4.2  CL  --  98 102  CO2  --  28 27  GLUCOSE  --  113* 125*  BUN  --  22* 20  CREATININE  --  1.51* 1.30*  CALCIUM  --  8.7* 8.3*  MG  --   --  2.1  PHOS  --   --  2.9    Liver Function Tests: Recent Labs  Lab 09/01/21 1423  AST 23  ALT 20  ALKPHOS  57  BILITOT 1.1  PROT 7.4  ALBUMIN 3.8   No results for input(s): LIPASE, AMYLASE in the last 168 hours. No results for input(s): AMMONIA in the last 168 hours.  CBC: Recent Labs  Lab 09/01/21 1320 09/01/21 1341 09/02/21 0418  WBC 7.9  --  12.0*  NEUTROABS  --   --  11.2*  HGB 14.2 14.3 13.2  HCT 43.0 42.0 41.0  MCV 96.0  --  97.9  PLT PLATELET CLUMPS NOTED ON SMEAR, UNABLE TO ESTIMATE  --  120*    Cardiac Enzymes: No results for input(s): CKTOTAL, CKMB, CKMBINDEX, TROPONINI in the last 168 hours.  BNP: BNP (last 3 results) Recent Labs    06/19/21 0957 09/01/21 1713 09/02/21 0526  BNP 235.5* 130.8* 231.9*    ProBNP (last 3 results) No results for input(s): PROBNP in the last 8760 hours.   CBG: Recent Labs  Lab 09/01/21 1745 09/01/21 1902 09/01/21 2252 09/02/21 0349 09/02/21 1950  GLUCAP 128* 142* 125* 115* 117*    Coagulation Studies: Recent Labs    09/01/21 1320  LABPROT 14.5  INR 1.1     Imaging   DG CHEST PORT 1 VIEW  Result Date: 09/02/2021 CLINICAL DATA:  Shortness of breath. EXAM: PORTABLE CHEST 1 VIEW COMPARISON:  Portable chest yesterday at 1:36 p.m. FINDINGS: Left single lead cardiac assist device and right ventricular wire insertion are stable. There are median sternotomy sutures. There is mild cardiomegaly with normal caliber central vessels. The lungs are mildly emphysematous but clear. The sulci are sharp. There is a stable mediastinal configuration. Compared with yesterday's film the vascular prominence centrally at the time is no longer seen. In all other respects there are no further changes. Thoracic spondylosis. IMPRESSION: No evidence of acute chest process. Mild cardiomegaly. Central vascular prominence noted yesterday is no longer seen. COPD. Electronically Signed   By: Telford Nab M.D.   On: 09/02/2021 07:26   DG Chest Port 1 View  Result Date: 09/01/2021 CLINICAL DATA:  Dyspnea EXAM: PORTABLE CHEST 1 VIEW COMPARISON:   06/20/2021 FINDINGS: Previously seen PICC line has been removed. Left-sided cardiac device remains in place. Stable cardiomegaly. Mild pulmonary vascular congestion. No focal airspace consolidation, pleural effusion, or pneumothorax. IMPRESSION: Cardiomegaly with mild pulmonary vascular congestion. Electronically Signed   By: Davina Poke D.O.   On: 09/01/2021 14:15   ECHOCARDIOGRAM COMPLETE  Result Date: 09/02/2021    ECHOCARDIOGRAM REPORT   Patient Name:   Kevin Cardenas Date of Exam: 09/02/2021 Medical Rec #:  [id number]       Height:       66.0 in Accession #:    [id number]      Weight:       239.9 lb Date of Birth:  [birth year]       BSA:          2.160 m Patient Age:    52 years        BP:           115/74 mmHg Patient Gender: M               HR:           93 bpm. Exam Location:  Inpatient Procedure: 2D Echo, Cardiac Doppler, Color Doppler and Intracardiac            Opacification Agent Indications:     Congestive heart failure  History:         Patient has prior history of Echocardiogram examinations, most                  recent 06/20/2021. Pacemaker and Prior CABG; Risk Factors:Former                  Smoker.  Sonographer:     Joette Catching RCS Referring Phys:  [id number] GRACE E BOWSER Diagnosing Phys: Gwyndolyn Kaufman MD  Sonographer Comments: Patient is morbidly obese and Technically difficult study due to poor echo windows. Image acquisition challenging due to patient body habitus. IMPRESSIONS  1. Left ventricular ejection fraction, by estimation, is 35%. The left ventricle has moderately decreased function. The left ventricle demonstrates global hypokinesis. Left ventricular diastolic parameters are indeterminate. No residual VSD visualized by color doppler.  2. Right ventricular systolic function is normal. The right ventricular size is moderately enlarged. There is mildly elevated pulmonary artery systolic pressure. The estimated right ventricular systolic pressure is 38.1 mmHg.  3. The mitral  valve is normal in  structure. Trivial mitral valve regurgitation.  4. The aortic valve is tricuspid. Aortic valve regurgitation is not visualized.  5. The inferior vena cava is dilated in size with >50% respiratory variability, suggesting right atrial pressure of 8 mmHg.  6. No significant pulmonic stenosis. Peak gradient 61mHg. Comparison(s): Compared to prior TTE in 06/2021, there is no significant change. FINDINGS  Left Ventricle: No residual VSD visualized. Left ventricular ejection fraction, by estimation, is 35%. The left ventricle has severely decreased function. The left ventricle demonstrates global hypokinesis. Definity contrast agent was given IV to delineate the left ventricular endocardial borders. The left ventricular internal cavity size was normal in size. There is no left ventricular hypertrophy. Abnormal (paradoxical) septal motion consistent with post-operative status. Left ventricular diastolic parameters are indeterminate. Right Ventricle: The right ventricular size is moderately enlarged. No increase in right ventricular wall thickness. Right ventricular systolic function is normal. There is mildly elevated pulmonary artery systolic pressure. The tricuspid regurgitant velocity is 2.65 m/s, and with an assumed right atrial pressure of 8 mmHg, the estimated right ventricular systolic pressure is 368.1mmHg. Left Atrium: Left atrial size was normal in size. Right Atrium: Right atrial size was normal in size. Pericardium: There is no evidence of pericardial effusion. Mitral Valve: The mitral valve is normal in structure. Trivial mitral valve regurgitation. Tricuspid Valve: The tricuspid valve is normal in structure. Tricuspid valve regurgitation is trivial. Aortic Valve: The aortic valve is tricuspid. Aortic valve regurgitation is not visualized. Aortic valve mean gradient measures 5.0 mmHg. Aortic valve peak gradient measures 7.3 mmHg. Aortic valve area, by VTI measures 4.05 cm. Pulmonic Valve:  The pulmonic valve was normal in structure. Pulmonic valve regurgitation is trivial. Aorta: The aortic root and ascending aorta are structurally normal, with no evidence of dilitation. Pulmonary Artery: No significant pulmonic stenosis. Peak gradient 662mg. Venous: The inferior vena cava is dilated in size with greater than 50% respiratory variability, suggesting right atrial pressure of 8 mmHg. IAS/Shunts: The atrial septum is grossly normal. Additional Comments: A device lead is visualized.  LEFT VENTRICLE PLAX 2D LVIDd:         5.35 cm      Diastology LVIDs:         3.75 cm      LV e' medial:    7.62 cm/s LV PW:         1.10 cm      LV E/e' medial:  17.7 LV IVS:        1.30 cm      LV e' lateral:   11.00 cm/s LVOT diam:     2.50 cm      LV E/e' lateral: 12.3 LV SV:         91 LV SV Index:   42 LVOT Area:     4.91 cm  LV Volumes (MOD) LV vol d, MOD A2C: 145.0 ml LV vol d, MOD A4C: 104.0 ml LV vol s, MOD A2C: 65.7 ml LV vol s, MOD A4C: 41.3 ml LV SV MOD A2C:     79.3 ml LV SV MOD A4C:     104.0 ml LV SV MOD BP:      72.0 ml RIGHT VENTRICLE RV Basal diam:  5.50 cm RV Mid diam:    4.40 cm TAPSE (M-mode): 1.7 cm LEFT ATRIUM           Index        RIGHT ATRIUM           Index  LA diam:      2.60 cm 1.20 cm/m   RA Area:     16.30 cm LA Vol (A2C): 30.2 ml 13.98 ml/m  RA Volume:   38.00 ml  17.59 ml/m LA Vol (A4C): 31.8 ml 14.72 ml/m  AORTIC VALVE                     PULMONIC VALVE AV Area (Vmax):    3.82 cm      PV Vmax:       2.10 m/s AV Area (Vmean):   3.69 cm      PV Peak grad:  17.6 mmHg AV Area (VTI):     4.05 cm AV Vmax:           135.00 cm/s AV Vmean:          104.000 cm/s AV VTI:            0.224 m AV Peak Grad:      7.3 mmHg AV Mean Grad:      5.0 mmHg LVOT Vmax:         105.00 cm/s LVOT Vmean:        78.200 cm/s LVOT VTI:          0.185 m LVOT/AV VTI ratio: 0.83  AORTA Ao Root diam: 2.80 cm Ao Asc diam:  3.50 cm MITRAL VALVE                TRICUSPID VALVE MV Area (PHT): 9.25 cm     TR Peak grad:   28.1  mmHg MV Decel Time: 82 msec      TR Vmax:        265.00 cm/s MV E velocity: 135.00 cm/s MV A velocity: 132.00 cm/s  SHUNTS MV E/A ratio:  1.02         Systemic VTI:  0.18 m                             Systemic Diam: 2.50 cm Gwyndolyn Kaufman MD Electronically signed by Gwyndolyn Kaufman MD Signature Date/Time: 09/02/2021/11:02:10 AM    Final (Updated)    [place] EKG SITE RITE  Result Date: 09/01/2021 If Site Rite image not attached, placement could not be confirmed due to current cardiac rhythm.    Medications:     Current Medications:  arformoterol  15 mcg Nebulization BID   aspirin  81 mg Oral Daily   budesonide (PULMICORT) nebulizer solution  0.25 mg Nebulization BID   Chlorhexidine Gluconate Cloth  6 each Topical Daily   enoxaparin (LOVENOX) injection  40 mg Subcutaneous Q24H   insulin aspart  0-9 Units Subcutaneous Q4H   mouth rinse  15 mL Mouth Rinse BID   simvastatin  20 mg Oral QHS    Infusions:  sodium chloride 250 mL (09/01/21 2056)    ceFAZolin (ANCEF) IV     norepinephrine (LEVOPHED) Adult infusion Stopped (09/01/21 2209)      Patient Profile   46 y.o. male who has a history of VSD repair and removal of a pulmonary artery band at age 87, DM, HTN, systolic heart failure, NICM s/p ICD and OSA on CPAP, admitted w/ septic shock and staph aureus bacteremia 2/2 RLE cellulitis.   Assessment/Plan   Septic Shock/ Staph Bacteremia  - source suspected RLE cellulitis - PCT 24  - BCx 5/21 + Staph Aureus  - Abx vanc/cefepime>>Ancef  - fever curve down trending  - LA  2.0>>1.8  - BP now stable off NE  - TTE w/o obvious vegetation but will need TEE - Has ICD, ? If needs to be extracted   2. Acute on Chronic Systolic Heart Failure  - Nonischemic cardiomyopathy.  RHC in 4/16 showed preserved cardiac index by thermodilution but low by Fick. Last Echo in 3/23 showed EF 30-35%, mild LVH, no VSD, no significant pulmonary stenosis, moderate RVE with normal RV systolic function. LHC/RHC in  3/23 with no coronary disease, CI 3.28.  He has a [employer], political [employer] ICD.   - NYHA Class II chronically  - Mildly fluid overloaded. Received IVF resuscitation for sepsis. Admit CXR w/ mild pulmonary vascular congestion. BNP 231. IVC mildly dilated on echo. - Give dose of IV Lasix 80 mg x 1  - Initially hypotensive. BP now stable off of NE, AKI improving  - will try to restart HF meds tomorrow if remains stable  - he has ICD. ? If needs to be removed given bacteremia   3. AKI  - SCr 1.5 on admit (BL ~1.1) - in setting of sepsis/hypotension  - improving, SCr 1.3 today  - avoid hypotension, BP currently stable off NE  - follow BMP   4. H/o VSD:  - No residual shunt noted on echo this admit.   - Last RHC also did not suggest any significant left to right shunting.   5. Pulmonary stenosis:  - No significant pulmonic stenosis on echo. Peak gradient 87mHg.  6. OSA:  - reports compliance w/ home CPAP.  7. Type 2DM  - SSI     Length of Stay: 1  Brittainy Simmons, PA-C  09/02/2021, 11:25 AM  Advanced Heart Failure Team Pager [phone](M-F; 7a - 5p)  Please contact [facility] for night-coverage after hours (4p -7a ) and weekends on [email or web address]  Patient seen and examined with the above-signed Advanced Practice Provider and/or Housestaff. I personally reviewed laboratory data, imaging studies and relevant notes. I independently examined the patient and formulated the important aspects of the plan. I have edited the note to reflect any of my changes or salient points. I have personally discussed the plan with the patient and/or family.  46y/o male as above with h/o VSD repair and removal of PA band, systolic HF EF 338%s/p ICD admitted with cellulitis/sepsis. GDMT/diuretics held and was hydrated. Required NE -> now off  Bcx now + MSSA. Recovering well from sepsis. Now volume overloaded. Will give IV lasix.   TTE reviewed. EF stable. No obvious vegetation.   General:  Febrile. Weak appearing.  No resp difficulty HEENT: normal Neck: supple. JVP up Carotids 2+ bilat; no bruits. No lymphadenopathy or thryomegaly appreciated. Cor: PMI nondisplaced. Regular rate & rhythm. No rubs, gallops or murmurs. Lungs: clear Abdomen: soft, nontender, + distended. No hepatosplenomegaly. No bruits or masses. Good bowel sounds. Extremities: no cyanosis, clubbing, rash, edema small wound RLE Neuro: alert & orientedx3, cranial nerves grossly intact. moves all 4 extremities w/o difficulty. Affect pleasant  Agree with IV lasix. Support BP with midodrine. Continue Ancef. Will plan TEE to further evaluate for vegetation. If no vegetation will await ID input regarding advice on treating through it or removing device.  Slow introduction of GDMT as tolerated.   DGlori Bickers MD  1:42 PM

## 2021-09-02 NOTE — Consult Note (Signed)
[facility]    Date of Admission:  09/01/2021   Total days of inpatient antibiotics 1        Reason for Consult: MSSA bacteremia    Principal Problem:   Septic shock ([place])   Assessment: 66 YM with HF, NICM with ICD implante din 2016 admitted with MSSA bacteremia   #MSSA bacteremia suspected 2/2 RLE wound #ICD placed in 2016 #RLE wound-chronic #Septic shock requiring pressors #DM -Given he has an ICD will need TEE to look for vegetation, follow-up TTE prior to ordering TEE.  -Source of bacteremia is unclear. He does have a chronic RLE wound(2cm), although it is an open wound, I did not appreciate erythema around wound today. Dnies any recent surgical interventions. Note dto have undergone heat cath on 3/13. Recommendations:  -D/C vancomycin, cefepime and metronidazole -Start cefazolin -Repeat blood Cx to ensure clearance -Follow-up TTE-> will need TEE  Microbiology:   Antibiotics: 5/21 vancomycin, metronidazole and cefepime  Cultures: Blood 5/21 1/2 MSSA    HPI: Kevin Cardenas is a 46 y.o. male with PMH of VSD repair and removal of pulmonary artery band at ago of 2, HF, NICM with St Jude ICD implanted 01/2015, OSA, DM, HTN admitted to [facility] for MSSA bacteremia and RLE cellulitis. He presented with sudden onset of chills, dyspnea. On arrival to the ED , he had temp of 103.1, wbc 12k requiring IVF and pressors. He has a Hx of RLE wound which had been bleeding. ID engaged per MSSA bacteremia.  Today, pt reports he has had right leg wound for about a year, it had been"popping" open at times. Denies any issues with ICD site. No hardware besides ICD noted.    Review of Systems: Review of Systems  All other systems reviewed and are negative.  Past Medical History:  Diagnosis Date   Chronic systolic CHF (congestive heart failure) (HCC)    EF 25% by echo 06/2018   Diabetes mellitus    Hypertension    Non-ischemic cardiomyopathy  ([place])    Obstructive sleep apnea    VSD (ventricular septal defect) 1978   repaired at age 37    Social History   Tobacco Use   Smoking status: Former    Packs/day: 0.30    Years: 15.00    Pack years: 4.50    Types: Cigarettes    Quit date: 04/08/2011    Years since quitting: 10.4   Smokeless tobacco: Never   Tobacco comments:    Smokes depends on who is around him.    Substance Use Topics   Alcohol use: No    Alcohol/week: 0.0 standard drinks    Comment: Rarely    Drug use: No    Family History  Problem Relation Age of Onset   Lung cancer Father    Stroke Father    Stroke Mother    Diabetes Maternal Grandfather    Diabetes Paternal Grandmother    Cancer Neg Hx    Alcohol abuse Neg Hx    Drug abuse Neg Hx    Early death Neg Hx    Heart disease Neg Hx    Hyperlipidemia Neg Hx    Hypertension Neg Hx    Kidney disease Neg Hx    Scheduled Meds:  arformoterol  15 mcg Nebulization BID   aspirin  81 mg Oral Daily   budesonide (PULMICORT) nebulizer solution  0.25 mg Nebulization BID   Chlorhexidine Gluconate Cloth  6 each Topical Daily   enoxaparin (LOVENOX) injection  40 mg Subcutaneous Q24H   insulin aspart  0-9 Units Subcutaneous Q4H   mouth rinse  15 mL Mouth Rinse BID   simvastatin  20 mg Oral QHS   Continuous Infusions:  sodium chloride 250 mL (09/01/21 2056)    ceFAZolin (ANCEF) IV 2 g (09/02/21 1334)   norepinephrine (LEVOPHED) Adult infusion Stopped (09/01/21 2209)   PRN Meds:.acetaminophen, docusate sodium, ondansetron (ZOFRAN) IV, polyethylene glycol Allergies  Allergen Reactions   Metolazone Other (See Comments)    "Dried up my kidneys- took only one tablet"    OBJECTIVE: Blood pressure (!) 132/118, pulse (!) 101, temperature 100.3 F (37.9 C), temperature source Oral, resp. rate 19, weight 108.8 kg, SpO2 95 %.  Physical Exam Constitutional:      General: He is not in acute distress.    Appearance: He is normal weight. He is not  toxic-appearing.  HENT:     Head: Normocephalic and atraumatic.     Right Ear: External ear normal.     Left Ear: External ear normal.     Nose: No congestion or rhinorrhea.     Mouth/Throat:     Mouth: Mucous membranes are moist.     Pharynx: Oropharynx is clear.  Eyes:     Extraocular Movements: Extraocular movements intact.     Conjunctiva/sclera: Conjunctivae normal.     Pupils: Pupils are equal, round, and reactive to light.  Cardiovascular:     Rate and Rhythm: Normal rate and regular rhythm.     Heart sounds: No murmur heard.   No friction rub. No gallop.     Comments: Left chest ICD intact without tenderness or erythema  Pulmonary:     Effort: Pulmonary effort is normal.     Breath sounds: Normal breath sounds.  Abdominal:     General: Abdomen is flat. Bowel sounds are normal.     Palpations: Abdomen is soft.  Musculoskeletal:        General: No swelling.     Cervical back: Normal range of motion and neck supple.     Comments: 2 cm right later leg wound  Skin:    General: Skin is warm and dry.  Neurological:     General: No focal deficit present.     Mental Status: He is oriented to person, place, and time.  Psychiatric:        Mood and Affect: Mood normal.    Lab Results Lab Results  Component Value Date   WBC 12.0 (H) 09/02/2021   HGB 13.2 09/02/2021   HCT 41.0 09/02/2021   MCV 97.9 09/02/2021   PLT 120 (L) 09/02/2021    Lab Results  Component Value Date   CREATININE 1.30 (H) 09/02/2021   BUN 20 09/02/2021   NA 137 09/02/2021   K 4.2 09/02/2021   CL 102 09/02/2021   CO2 27 09/02/2021    Lab Results  Component Value Date   ALT 20 09/01/2021   AST 23 09/01/2021   ALKPHOS 57 09/01/2021   BILITOT 1.1 09/01/2021       Laurice Record, [place] [facility] 09/02/2021, 1:36 PM

## 2021-09-02 NOTE — Progress Notes (Signed)
Echocardiogram 2D Echocardiogram has been performed.  Joette Catching 09/02/2021, 9:30 AM

## 2021-09-02 NOTE — Progress Notes (Addendum)
NAME:  Kevin Cardenas, MRN:  [id number], DOB:  08-25-1975, LOS: 1 ADMISSION DATE:  09/01/2021, CONSULTATION DATE:  09/01/2021 REFERRING MD:  EDP, CHIEF COMPLAINT:  hypotension   History of Present Illness:  Kevin Cardenas is a 46 y.o. male with PMH of VSD repair and removal of a pulmonary artery band at age 47, DM, HTN, systolic heart failure, NICM and OSA diagnosed in past but not on CPAP. Has St Jude ICD (placed 01/2015). CPX in 10/20 showed moderate functional limitation primarily due to restrictive lung disease.    Pt. Presented 5/21 to [facility] ED  with sudden onset of chills, dyspnea, no cough.  EMS reports sats were 92% and placed on oxygen at 4 l/m. In the ED he was febrile to 103.1 , Lactate was 2.0, and he was hypotensive into the 47'W systolic . He received 2 L of fluid in the ED, with maintenance LR of 125/hr. He recently, within the week,  had repair of RLE varicose vein that had been bleeding by his PCP.  Suspect this area could be source of infection cellulitis. Pt was started on Levophed in the ED for his hypotension. He will be admitted to the ICU Until he is hemodynamically stable. PCCM to admit and manage care.  Pertinent  Medical History   Past Medical History:  Diagnosis Date   Chronic systolic CHF (congestive heart failure) (HCC)    EF 25% by echo 06/2018   Diabetes mellitus    Hypertension    Non-ischemic cardiomyopathy ([place])    Obstructive sleep apnea    VSD (ventricular septal defect) 1978   repaired at age [place] [facility] Events: Including procedures, antibiotic start and stop dates in addition to other pertinent events   5/21 Admit to PCCM, started on peripheral pressors 5/22 off pressors  Interim History / Subjective:  Overnight was weaned off vasopressors. This morning reports pain is well-controlled. Reports he is hungry, but no other acute complaints.   Objective   Blood pressure 121/80, pulse 99, temperature 99.7 F (37.6 C), temperature source  Oral, resp. rate (!) 23, weight 108.8 kg, SpO2 95 %.        Intake/Output Summary (Last 24 hours) at 09/02/2021 0916 Last data filed at 09/02/2021 0700 Gross per 24 hour  Intake 1892.76 ml  Output 500 ml  Net 1392.76 ml   Filed Weights   09/02/21 0500  Weight: 108.8 kg   Examination: General: Resting in bed in no acute distress HENT: Normocephalic, atraumatic, moist mucous membranes Lungs: Normal respiratory effort on nasal cannula. Clear to ausculation bilaterally. Cardiovascular: Regular rate, rhythm. No murmurs appreciated. Distal pulses 2+ bilaterally. Abdomen: Soft, non-tender, non-distended. Normoactive bowel sounds. Extremities: Warm, dry. Bandage over RLE. No peripheral edema appreciated. Neuro: Awake, alert, conversing appropriately. Grossly non-focal, moving all extremities.  Assessment & Plan:  #MSSA bacteremia #RLE cellulitis Patient is hemodynamically stable this morning, off of pressors since last evening. Bcx 2/4 positive for MSSA. Will de-escalate antibiotics to cefazolin this morning. ID auto-consulted, appreciate their assistance. Did get TTE this morning, will assess for any vegetations. Holding off [facility] or CVC placement with stability of blood pressure. Depending on BP later today can consider transferring to floor.  - D/c vancomycin, cefepime - Start daily Ancef - Follow-up TTE - Follow-up ID recommendations  #Chronic systolic heart failure 2/2 NICM s/p ICD (2016) #Pulmonary hypertension #Congenital VSD s/p repair Appears euvolemic this morning. BNP did mildly elevate to 200 from admission. Holding his GDMT given  soft blood pressures. Per family's wishes, will consult heart failure for medication assistance. - Hold diuresis, GDMT - Follow-up Echo - Consult to heart failure  #Acute kidney injury Baseline sCr ~1.1, on arrival 1.5. This morning improving after fluids. Patient is alert and awake this morning, I have ordered diet. Expect renal function to  continue to improve as he increases po intake.  - Avoid nephrotoxic meds - Strict I/O - Daily BMP  #OSA - CPAP QHS  #Hyperlipidemia - Restart home simvastatin  *Of note, he is chronically on aspirin, which appears to be for primary prevention. Would consider discussing stopping this further with patient's PCP at discharge.   Best Practice (right click and "Reselect all SmartList Selections" daily)   Diet/type: Regular consistency (see orders) DVT prophylaxis: LMWH GI prophylaxis: N/A Lines: N/A Foley:  N/A Code Status:  full code Last date of multidisciplinary goals of care discussion [ updated wife via phone this morning ]  Labs   CBC: Recent Labs  Lab 09/01/21 1320 09/01/21 1341 09/02/21 0418  WBC 7.9  --  12.0*  NEUTROABS  --   --  11.2*  HGB 14.2 14.3 13.2  HCT 43.0 42.0 41.0  MCV 96.0  --  97.9  PLT PLATELET CLUMPS NOTED ON SMEAR, UNABLE TO ESTIMATE  --  120*    Basic Metabolic Panel: Recent Labs  Lab 09/01/21 1341 09/01/21 1423 09/02/21 0418  NA 135 135 137  K 4.0 4.3 4.2  CL  --  98 102  CO2  --  28 27  GLUCOSE  --  113* 125*  BUN  --  22* 20  CREATININE  --  1.51* 1.30*  CALCIUM  --  8.7* 8.3*  MG  --   --  2.1  PHOS  --   --  2.9   GFR: Estimated Creatinine Clearance: 82.1 mL/min (A) (by C-G formula based on SCr of 1.3 mg/dL (H)). Recent Labs  Lab 09/01/21 1320 09/01/21 1331 09/01/21 1423 09/01/21 1713 09/01/21 2155 09/02/21 0418  PROCALCITON  --   --   --  24.67  --   --   WBC 7.9  --   --   --   --  12.0*  LATICACIDVEN  --  1.8 2.0*  --  1.2  --     Liver Function Tests: Recent Labs  Lab 09/01/21 1423  AST 23  ALT 20  ALKPHOS 57  BILITOT 1.1  PROT 7.4  ALBUMIN 3.8   No results for input(s): LIPASE, AMYLASE in the last 168 hours. No results for input(s): AMMONIA in the last 168 hours.  ABG    Component Value Date/Time   PHART 7.266 (L) 06/19/2021 1620   PCO2ART 83.1 (HH) 06/19/2021 1620   PO2ART 79 (L) 06/19/2021 1620    HCO3 29.7 (H) 09/01/2021 1713   TCO2 31 09/01/2021 1341   ACIDBASEDEF 2.0 08/03/2014 1417   O2SAT 92.2 09/01/2021 1713     Coagulation Profile: Recent Labs  Lab 09/01/21 1320  INR 1.1    Cardiac Enzymes: No results for input(s): CKTOTAL, CKMB, CKMBINDEX, TROPONINI in the last 168 hours.  HbA1C: Hgb A1c MFr Bld  Date/Time Value Ref Range Status  06/20/2021 03:55 AM 6.1 (H) 4.8 - 5.6 % Final    Comment:    (NOTE) Pre diabetes:          5.7%-6.4%  Diabetes:              >6.4%  Glycemic control for   <  7.0% adults with diabetes   04/17/2021 10:47 AM 6.4 (H) 4.8 - 5.6 % Final    Comment:             Prediabetes: 5.7 - 6.4          Diabetes: >6.4          Glycemic control for adults with diabetes: <7.0     CBG: Recent Labs  Lab 09/01/21 1745 09/01/21 1902 09/01/21 2252 09/02/21 0349 09/02/21 0705  GLUCAP 128* 142* 125* 115* 117*    Review of Systems:   Review of Systems - General ROS: negative for - chills, fever, or malaise Respiratory ROS: no cough, shortness of breath, or wheezing Cardiovascular ROS: no chest pain or dyspnea on exertion Gastrointestinal ROS: no abdominal pain, change in bowel habits, or black or bloody stools   Past Medical History:  He,  has a past medical history of Chronic systolic CHF (congestive heart failure) ([place]), Diabetes mellitus, Hypertension, Non-ischemic cardiomyopathy ([place]), Obstructive sleep apnea, and VSD (ventricular septal defect) (1978).   Surgical History:   Past Surgical History:  Procedure Laterality Date   EP IMPLANTABLE DEVICE N/A 01/19/2015   Procedure: ICD Implant;  Surgeon: Deboraha Sprang, MD;  Location: [facility];  Service: Cardiovascular;  Laterality: N/A;   RIGHT HEART CATHETERIZATION N/A 08/03/2014   Procedure: RIGHT HEART CATH;  Surgeon: Larey Dresser, MD;  Location: [facility]ATH LAB;  Service: Cardiovascular;  Laterality: N/A;   RIGHT/LEFT HEART CATH AND CORONARY ANGIOGRAPHY N/A 06/24/2021    Procedure: RIGHT/LEFT HEART CATH AND CORONARY ANGIOGRAPHY;  Surgeon: Larey Dresser, MD;  Location: [place] [facility];  Service: Cardiovascular;  Laterality: N/A;   VSD REPAIR       Social History:   reports that he quit smoking about 10 years ago. His smoking use included cigarettes. He has a 4.50 pack-year smoking history. He has never used smokeless tobacco. He reports that he does not drink alcohol and does not use drugs.   Family History:  His family history includes Diabetes in his maternal grandfather and paternal grandmother; Lung cancer in his father; Stroke in his father and mother. There is no history of Cancer, Alcohol abuse, Drug abuse, Early death, Heart disease, Hyperlipidemia, Hypertension, or Kidney disease.   Allergies Allergies  Allergen Reactions   Metolazone Other (See Comments)    "Dried up my kidneys- took only one tablet"     Home Medications  Prior to Admission medications   Medication Sig Start Date End Date Taking? Authorizing Provider  ascorbic acid (VITAMIN C) 500 MG tablet Take 500-1,000 mg by mouth daily.    [provider]  aspirin 81 MG chewable tablet Chew 1 tablet (81 mg total) by mouth daily. 04/11/12   Ripley Fraise, MD  carvedilol (COREG) 3.125 MG tablet Take 1 tablet (3.125 mg total) by mouth 2 (two) times daily with a meal. 06/24/21   Joette Catching, PA-C  dapagliflozin propanediol (FARXIGA) 10 MG TABS tablet Take 1 tablet (10 mg total) by mouth daily. 08/28/21   Joette Catching, PA-C  digoxin (LANOXIN) 0.125 MG tablet Take 1 tablet (125 mcg total) by mouth daily. 08/19/21   Larey Dresser, MD  fluticasone furoate-vilanterol (BREO ELLIPTA) 100-25 MCG/ACT AEPB Inhale 1 puff into the lungs daily. 07/17/21   Dorna Mai, MD  potassium chloride SA (KLOR-CON M) 20 MEQ tablet Take 1 tablet (20 mEq total) by mouth daily. 08/19/21   Larey Dresser, MD  sacubitril-valsartan Delene Loll)  49-51 MG Take 1 tablet by mouth 2 (two) times  daily. 07/03/21   Larey Dresser, MD  simvastatin (ZOCOR) 20 MG tablet Take 1 tablet (20 mg total) by mouth at bedtime. 03/01/21   Larey Dresser, MD  spironolactone (ALDACTONE) 25 MG tablet Take 1 tablet (25 mg total) by mouth daily. 06/24/21   Joette Catching, PA-C  torsemide (DEMADEX) 20 MG tablet Take 2 tablets (40 mg total) by mouth every morning AND 1 tablet (20 mg total) every evening. Patient not taking: Reported on 08/21/2021 07/16/21   Larey Dresser, MD  torsemide (DEMADEX) 20 MG tablet Take 1 tablet (20 mg total) by mouth 2 (two) times daily. 08/19/21   Larey Dresser, MD     Critical care time: 76 minutes    Sanjuan Dame, MD Internal Medicine Resident PGY-2 Pager: [phone]

## 2021-09-03 DIAGNOSIS — B9561 Methicillin susceptible Staphylococcus aureus infection as the cause of diseases classified elsewhere: Secondary | ICD-10-CM

## 2021-09-03 DIAGNOSIS — A419 Sepsis, unspecified organism: Secondary | ICD-10-CM | POA: Diagnosis not present

## 2021-09-03 DIAGNOSIS — R7881 Bacteremia: Secondary | ICD-10-CM

## 2021-09-03 DIAGNOSIS — Z959 Presence of cardiac and vascular implant and graft, unspecified: Secondary | ICD-10-CM

## 2021-09-03 DIAGNOSIS — R6521 Severe sepsis with septic shock: Secondary | ICD-10-CM | POA: Diagnosis not present

## 2021-09-03 LAB — GLUCOSE, CAPILLARY
Glucose-Capillary: 141 mg/dL — ABNORMAL HIGH (ref 70–99)
Glucose-Capillary: 137 mg/dL — ABNORMAL HIGH (ref 70–99)
Glucose-Capillary: 149 mg/dL — ABNORMAL HIGH (ref 70–99)
Glucose-Capillary: 112 mg/dL — ABNORMAL HIGH (ref 70–99)
Glucose-Capillary: 136 mg/dL — ABNORMAL HIGH (ref 70–99)

## 2021-09-03 LAB — BASIC METABOLIC PANEL
Anion gap: 8 (ref 5–15)
BUN: 15 mg/dL (ref 6–20)
CO2: 27 mmol/L (ref 22–32)
Calcium: 8.4 mg/dL — ABNORMAL LOW (ref 8.9–10.3)
Chloride: 99 mmol/L (ref 98–111)
Creatinine, Ser: 0.93 mg/dL (ref 0.61–1.24)
GFR, Estimated: >60 mL/min (ref >60)
Glucose, Bld: 149 mg/dL — ABNORMAL HIGH (ref 70–99)
Potassium: 3.6 mmol/L (ref 3.5–5.1)
Sodium: 134 mmol/L — ABNORMAL LOW (ref 135–145)

## 2021-09-03 LAB — CBC
HCT: 40.8 % (ref 39.0–52.0)
Hemoglobin: 13 g/dL (ref 13.0–17.0)
MCH: 31.6 pg (ref 26.0–34.0)
MCHC: 31.9 g/dL (ref 30.0–36.0)
MCV: 99 fL (ref 80.0–100.0)
Platelets: 89 10*3/uL — ABNORMAL LOW (ref 150–400)
RBC: 4.12 MIL/uL — ABNORMAL LOW (ref 4.22–5.81)
RDW: 14.6 % (ref 11.5–15.5)
WBC: 4.1 10*3/uL (ref 4.0–10.5)
nRBC: 0 % (ref 0.0–0.2)

## 2021-09-03 LAB — MAGNESIUM: Magnesium: 2.2 mg/dL (ref 1.7–2.4)

## 2021-09-03 MED ORDER — FUROSEMIDE 10 MG/ML IJ SOLN
80.0000 mg | Freq: Once | INTRAMUSCULAR | Status: AC
  Administered 2021-09-03: 80 mg via INTRAVENOUS
  Filled 2021-09-03: qty 8

## 2021-09-03 MED ORDER — DIGOXIN 125 MCG PO TABS
0.1250 mg | ORAL_TABLET | Freq: Every day | ORAL | Status: DC
  Administered 2021-09-03 – 2021-09-10 (×8): 0.125 mg via ORAL
  Filled 2021-09-03 (×9): qty 1

## 2021-09-03 MED ORDER — ACETAMINOPHEN 325 MG PO TABS
650.0000 mg | ORAL_TABLET | Freq: Four times a day (QID) | ORAL | Status: DC | PRN
  Administered 2021-09-03 – 2021-09-09 (×8): 650 mg via ORAL
  Filled 2021-09-03 (×8): qty 2

## 2021-09-03 MED ORDER — SPIRONOLACTONE 12.5 MG HALF TABLET
12.5000 mg | ORAL_TABLET | Freq: Every day | ORAL | Status: DC
  Administered 2021-09-03: 12.5 mg via ORAL
  Filled 2021-09-03 (×2): qty 1

## 2021-09-03 MED ORDER — INSULIN ASPART 100 UNIT/ML IJ SOLN
0.0000 [IU] | Freq: Three times a day (TID) | INTRAMUSCULAR | Status: DC
  Administered 2021-09-06: 1 [IU] via SUBCUTANEOUS
  Administered 2021-09-06 – 2021-09-07 (×2): 3 [IU] via SUBCUTANEOUS
  Administered 2021-09-07 – 2021-09-08 (×3): 1 [IU] via SUBCUTANEOUS
  Administered 2021-09-09: 3 [IU] via SUBCUTANEOUS
  Administered 2021-09-09 – 2021-09-10 (×2): 1 [IU] via SUBCUTANEOUS

## 2021-09-03 MED ORDER — POTASSIUM CHLORIDE CRYS ER 20 MEQ PO TBCR
40.0000 meq | EXTENDED_RELEASE_TABLET | Freq: Once | ORAL | Status: AC
  Administered 2021-09-03: 40 meq via ORAL
  Filled 2021-09-03: qty 2

## 2021-09-03 MED ORDER — DAPAGLIFLOZIN PROPANEDIOL 10 MG PO TABS
10.0000 mg | ORAL_TABLET | Freq: Every day | ORAL | Status: DC
Start: 2021-09-03 — End: 2021-09-10
  Administered 2021-09-03 – 2021-09-10 (×8): 10 mg via ORAL
  Filled 2021-09-03 (×9): qty 1

## 2021-09-03 NOTE — Progress Notes (Signed)
eLink Physician-Brief Progress Note Patient Name: Kevin Cardenas DOB: [birth year] MRN: [id number]   Date of Service  09/03/2021  HPI/Events of Note  Patient with minor headache Normal LFTs on admission  eICU Interventions  PRN tylenol ordered for HA     Intervention Category Minor Interventions: Routine modifications to care plan (e.g. PRN medications for pain, fever)  Kevin Cardenas 09/03/2021, 9:49 PM

## 2021-09-03 NOTE — Consult Note (Signed)
Cardiology Consultation:   Patient ID: Kevin Cardenas MRN: [id number]; DOB: 1975/05/09  Admit date: 09/01/2021 Date of Consult: 09/04/2021  PCP:  Dorna Mai, MD   [facility] Providers Cardiologist:  [name], MD  Electrophysiologist:  Virl Axe, MD  Advanced Heart Failure:  [name], MD  {     Patient Profile:   Kevin Cardenas is a 46 y.o. male with a hx of  VSD repair and removal of a pulmonary artery band at age 53, DM, HTN, chronic CHF (systolic), NICM, OSA (not on CPAP), there is mention of restrictive lung disease as well,  who is being seen 09/04/2021 for the evaluation of ICD w/bacteremia at the request of [name].  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 Caryl Comes and HF team since then doing fairly well, meds adjusted for lower BPs  Admitted now with fever 103.1, hypotension, MSSA bacteremia. Blood culture drawn before antibiotics were administered was positive for MSSA.  HF team is seeing him for volume management Midodrine was stopped today, holding his coreg/Entresto, restarting his spironolactone, dig and Wilder Glade today  ID is on board, bacteremia perhaps from a chronic LE wound/cellulitis 2/2 venous stasis.  EP is asked on board for consideration of extraction  09/01/21 BC 1 of 1 pre antibiotics were positive for MSSA. After antibiotics, 0/3 are positive.     Home and in pt meds  No anticoagulants at home    Past Medical History:  Diagnosis Date   Chronic systolic CHF (congestive heart failure) (HCC)    EF 25% by echo 06/2018   Diabetes mellitus     Hypertension    Non-ischemic cardiomyopathy ([place])    Obstructive sleep apnea    VSD (ventricular septal defect) 1978   repaired at age 39    Past Surgical History:  Procedure Laterality Date   EP IMPLANTABLE DEVICE N/A 01/19/2015   Procedure: ICD Implant;  Surgeon: Deboraha Sprang, MD;  Location: [place] [facility];  Service: Cardiovascular;  Laterality: N/A;   RIGHT HEART CATHETERIZATION N/A 08/03/2014   Procedure: RIGHT HEART CATH;  Surgeon: Larey Dresser, MD;  Location: [facility] C[facility];  Service: Cardiovascular;  Laterality: N/A;   RIGHT/LEFT HEART CATH AND CORONARY ANGIOGRAPHY N/A 06/24/2021   Procedure: RIGHT/LEFT HEART CATH AND CORONARY ANGIOGRAPHY;  Surgeon: Larey Dresser, MD;  Location: [place] [facility];  Service: Cardiovascular;  Laterality: N/A;   VSD REPAIR       Home Medications:  Prior to Admission medications   Medication Sig Start Date End Date Taking? Authorizing Provider  ascorbic acid (VITAMIN C) 500 MG tablet Take 500 mg by mouth daily.   Yes [provider]  aspirin 81 MG chewable tablet Chew 1 tablet (81 mg total) by mouth daily. 04/11/12  Yes Ripley Fraise, MD  carvedilol (COREG) 3.125 MG tablet Take 1 tablet (3.125 mg total) by mouth 2 (two) times daily with a meal. 06/24/21  Yes Joette Catching, PA-C  dapagliflozin propanediol (FARXIGA) 10 MG TABS tablet Take 1 tablet (10 mg total) by mouth daily. 08/28/21  Yes Joette Catching, PA-C  digoxin Fonnie Birkenhead)  0.125 MG tablet Take 1 tablet (125 mcg total) by mouth daily. 08/19/21  Yes Larey Dresser, MD  fluticasone furoate-vilanterol (BREO ELLIPTA) 100-25 MCG/ACT AEPB Inhale 1 puff into the lungs daily. 07/17/21  Yes Dorna Mai, MD  potassium chloride SA (KLOR-CON M) 20 MEQ tablet Take 1 tablet (20 mEq total) by mouth daily. 08/19/21  Yes Larey Dresser, MD  sacubitril-valsartan (ENTRESTO) 49-51 MG Take 1 tablet by mouth 2 (two) times daily. 07/03/21  Yes Larey Dresser, MD  simvastatin (ZOCOR)  20 MG tablet Take 1 tablet (20 mg total) by mouth at bedtime. 03/01/21  Yes Larey Dresser, MD  spironolactone (ALDACTONE) 25 MG tablet Take 1 tablet (25 mg total) by mouth daily. 06/24/21  Yes Joette Catching, PA-C  torsemide (DEMADEX) 20 MG tablet Take 1 tablet (20 mg total) by mouth 2 (two) times daily. 08/19/21  Yes Larey Dresser, MD  torsemide (DEMADEX) 20 MG tablet Take 2 tablets (40 mg total) by mouth every morning AND 1 tablet (20 mg total) every evening. Patient not taking: Reported on 08/21/2021 07/16/21   Larey Dresser, MD    Inpatient Medications: Scheduled Meds:  arformoterol  15 mcg Nebulization BID   aspirin  81 mg Oral Daily   budesonide (PULMICORT) nebulizer solution  0.25 mg Nebulization BID   Chlorhexidine Gluconate Cloth  6 each Topical Daily   dapagliflozin propanediol  10 mg Oral Daily   digoxin  0.125 mg Oral Daily   enoxaparin (LOVENOX) injection  40 mg Subcutaneous Q24H   insulin aspart  0-9 Units Subcutaneous TID WC   mouth rinse  15 mL Mouth Rinse BID   simvastatin  20 mg Oral QHS   spironolactone  12.5 mg Oral Daily   Continuous Infusions:  sodium chloride Stopped (09/03/21 2239)    ceFAZolin (ANCEF) IV 2 g (09/04/21 0611)   PRN Meds: acetaminophen, docusate sodium, ipratropium-albuterol, ondansetron (ZOFRAN) IV, polyethylene glycol  Allergies:    Allergies  Allergen Reactions   Metolazone Other (See Comments)    "Dried up my kidneys- took only one tablet"    Social History:   Social History   Socioeconomic History   Marital status: Single    Spouse name: Recently Married    Number of children: 0   Years of education: Not on file   Highest education level: Not on file  Occupational History   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   Tobacco Use   Smoking status: Former    Packs/day: 0.30    Years: 15.00    Pack years: 4.50    Types: Cigarettes    Quit date: 04/08/2011    Years  since quitting: 10.4   Smokeless tobacco: Never   Tobacco comments:    Smokes depends on who is around him.    Substance and Sexual Activity   Alcohol use: No    Alcohol/week: 0.0 standard drinks    Comment: Rarely    Drug use: No   Sexual activity: Not Currently  Other Topics Concern   Not on file  Social History Narrative   Caffienated drinks-no   Seat belt use often-no   Regular Exercise-   Smoke alarm in the home-yes   Firearms/guns in the home-no   History of physical abuse-no               Social Determinants of Health   Financial Resource Strain: Not on file  Food Insecurity: No Food Insecurity   Worried About Charity fundraiser in the Last Year: Never true   Ran Out of Food in the Last Year: Never true  Transportation Needs: No Transportation Needs   Lack of Transportation (Medical): No   Lack of Transportation (Non-Medical): No  Physical Activity: Not on file  Stress: Not on file  Social Connections: Not on file  Intimate Partner Violence: Not on file    Family History:   Family History  Problem Relation Age of Onset   Lung cancer Father    Stroke Father    Stroke Mother    Diabetes Maternal Grandfather    Diabetes Paternal Grandmother    Cancer Neg Hx    Alcohol abuse Neg Hx    Drug abuse Neg Hx    Early death Neg Hx    Heart disease Neg Hx    Hyperlipidemia Neg Hx    Hypertension Neg Hx    Kidney disease Neg Hx      ROS:  Please see the history of present illness.  All other ROS reviewed and negative.     Physical Exam/Data:   Vitals:   09/04/21 0100 09/04/21 0319 09/04/21 0400 09/04/21 0500  BP:   118/82   Pulse: 86  86   Resp: 15  17   Temp:  98.8 F (37.1 C)    TempSrc:  Oral    SpO2: 94%  97%   Weight:    108 kg    Intake/Output Summary (Last 24 hours) at 09/04/2021 0650 Last data filed at 09/04/2021 0530 Gross per 24 hour  Intake 455.01 ml  Output 4525 ml  Net -4069.99 ml      09/04/2021    5:00 AM 09/03/2021    5:00 AM  09/02/2021    5:00 AM  Last 3 Weights  Weight (lbs) 238 lb 1.6 oz 239 lb 13.8 oz 239 lb 13.8 oz  Weight (kg) 108 kg 108.8 kg 108.8 kg     Body mass index is 38.43 kg/m.   General:  Well nourished, well developed, in no acute distress. obese HEENT: normal. Poor dentition. Neck: no JVD Vascular: No carotid bruits; Distal pulses 2+ bilaterally Cardiac:  normal S1, S2; RRR; no murmur  Lungs:  clear to auscultation bilaterally, no wheezing, rhonchi or rales  Abd: soft, nontender, no hepatomegaly  Ext: no edema Musculoskeletal:  No deformities, BUE and BLE strength normal and equal Skin: warm and dry. RLE wound.  Neuro:  CNs 2-12 intact, no focal abnormalities noted Psych:  Normal affect   EKG:  The EKG was personally reviewed and demonstrates:   ST 106bpm, LAD  Telemetry:  Telemetry was personally reviewed and demonstrates: sinus rhythm.  Relevant CV Studies:  09/02/21: TTE  1. Left ventricular ejection fraction, by estimation, is 35%. The left  ventricle has moderately decreased function. The left ventricle  demonstrates global hypokinesis. Left ventricular diastolic parameters are  indeterminate. No residual VSD visualized  by color doppler.   2. Right ventricular systolic function is normal. The right ventricular  size is moderately enlarged. There is mildly elevated pulmonary artery  systolic pressure. The estimated right ventricular systolic pressure is  76.8 mmHg.   3. The mitral valve is normal in structure. Trivial mitral valve  regurgitation.   4. The aortic valve is tricuspid. Aortic valve regurgitation is not  visualized.   5. The inferior vena cava is dilated in size with >50% respiratory  variability, suggesting right atrial pressure  of 8 mmHg.   6. No significant pulmonic stenosis. Peak gradient 20mHg.   Comparison(s): Compared to prior TTE in 06/2021, there is no significant  change.    06/24/2021: R/LHC 1. Normal coronaries.  2. Normal filling pressures.   3. Normal cardiac output.  4. Mild pulmonary hypertension, suspect related to OHS/OSA.   Laboratory Data:  High Sensitivity Troponin:   Recent Labs  Lab 09/01/21 1423 09/01/21 1713  TROPONINIHS 6 8     Chemistry Recent Labs  Lab 09/02/21 0418 09/03/21 0209 09/04/21 0258  NA 137 134* 135  K 4.2 3.6 3.9  CL 102 99 97*  CO2 _0 GLUCOSE 125* 149* 115*  BUN _1 CREATININE 1.30* 0.93 0.95  CALCIUM 8.3* 8.4* 8.6*  MG 2.1 2.2 2.2  GFRNONAA >60 >60 >60  ANIONGAP _2 Recent Labs  Lab 09/01/21 1423  PROT 7.4  ALBUMIN 3.8  AST 23  ALT 20  ALKPHOS 57  BILITOT 1.1   Lipids No results for input(s): CHOL, TRIG, HDL, LABVLDL, LDLCALC, CHOLHDL in the last 168 hours.  Hematology Recent Labs  Lab 09/02/21 0418 09/03/21 0209 09/04/21 0258  WBC 12.0* 4.1 4.5  RBC 4.19* 4.12* 4.19*  HGB 13.2 13.0 13.1  HCT 41.0 40.8 40.8  MCV 97.9 99.0 97.4  MCH 31.5 31.6 31.3  MCHC 32.2 31.9 32.1  RDW 14.9 14.6 14.4  PLT 120* 89* 97*   Thyroid No results for input(s): TSH, FREET4 in the last 168 hours.  BNP Recent Labs  Lab 09/01/21 1713 09/02/21 0526  BNP 130.8* 231.9*    DDimer  Recent Labs  Lab 09/01/21 1713  DDIMER 2.52*     Radiology/Studies:  DG CHEST PORT 1 VIEW  Result Date: 09/02/2021 CLINICAL DATA:  Shortness of breath. EXAM: PORTABLE CHEST 1 VIEW COMPARISON:  Portable chest yesterday at 1:36 p.m. FINDINGS: Left single lead cardiac assist device and right ventricular wire insertion are stable. There are median sternotomy sutures. There is mild cardiomegaly with normal caliber central vessels. The lungs are mildly emphysematous but clear. The sulci are sharp. There is a stable mediastinal configuration. Compared with yesterday's film the vascular prominence centrally at the time is no longer seen. In all other respects there are no further changes. Thoracic spondylosis. IMPRESSION: No evidence of acute chest process. Mild cardiomegaly. Central vascular  prominence noted yesterday is no longer seen. COPD. Electronically Signed   By: KTelford NabM.D.   On: 09/02/2021 07:26   DG Chest Port 1 View Result Date: 09/01/2021 CLINICAL DATA:  Dyspnea EXAM: PORTABLE CHEST 1 VIEW COMPARISON:  06/20/2021 FINDINGS: Previously seen PICC line has been removed. Left-sided cardiac device remains in place. Stable cardiomegaly. Mild pulmonary vascular congestion. No focal airspace consolidation, pleural effusion, or pneumothorax. IMPRESSION: Cardiomegaly with mild pulmonary vascular congestion. Electronically Signed   By: NDavina PokeD.O.   On: 09/01/2021 14:15     Assessment and Plan:   MSSA bacteremia/CIED associated infection Patient admitted with septic shock secondary to MSSA bacteremia.  Cultures cleared after first dose of Vanco/cefepime.  Source of infection could be secondary to wound, poor dentition. There is a vegetation noted on today's transesophageal echo on the ICD lead.    Patient will require ICD system extraction for definitive treatment of his bacteremia and CIED associated infection.  I discussed the ICD extraction procedure in detail with the patient during my visit with him this morning.  I discussed the procedural details,  need for cardiothoracic surgery backup.  We discussed the risks including severe bleeding, emergent rescue surgery and death.  Risks, benefits, alternatives to ICD system extraction were discussed in detail with the patient today. The patient understands that the risks include but are not limited to bleeding, pneumothorax, perforation, tamponade, vascular damage, renal failure, MI, stroke, death.  Tentatively planning for system extraction tomorrow afternoon.  4 units of blood on hold.  Hold all blood thinners and aspirin.  He is not dependent on his device for pacing support.  We will plan a device free interval.  Down the road, consider subcutaneous ICD implant.    For questions or updates, please contact  [place] Please consult [email or web address] for contact info under    [employer]. Quentin Ore, MD, [facility], [facility] Cardiac Electrophysiology

## 2021-09-03 NOTE — H&P (View-Only) (Signed)
Patient ID: Kevin Cardenas, male   DOB: [birth year], 46 y.o.   MRN: [id number]     Advanced Heart Failure Rounding Note  PCP-Cardiologist: [name], MD   Subjective:    No complaints this morning.  Tm 99.4.  BP stable on low dose midodrine.  Good diuresis with dose of IV Lasix yesterday.   Echo: EF 35%, moderate RV enlargement, normal RV function, no significant pulmonary valve stenosis noted, no obvious vegetation.   Objective:   Weight Range: 108.8 kg Body mass index is 38.71 kg/m.   Vital Signs:   Temp:  [98.6 F (37 C)-100.3 F (37.9 C)] 98.6 F (37 C) (05/23 0731) Pulse Rate:  [87-107] 95 (05/23 0702) Resp:  [15-27] 27 (05/23 0702) BP: (102-143)/(62-118) 128/84 (05/23 0700) SpO2:  [64 %-99 %] 96 % (05/23 0702) Weight:  [108.8 kg] 108.8 kg (05/23 0500) Last BM Date : 09/02/21  Weight change: Filed Weights   09/02/21 0500 09/03/21 0500  Weight: 108.8 kg 108.8 kg    Intake/Output:   Intake/Output Summary (Last 24 hours) at 09/03/2021 0818 Last data filed at 09/03/2021 0700 Gross per 24 hour  Intake 944.81 ml  Output 2575 ml  Net -1630.19 ml      Physical Exam    General:  Well appearing. No resp difficulty HEENT: Normal Neck: Supple. JVP 8-9 cm. Carotids 2+ bilat; no bruits. No lymphadenopathy or thyromegaly appreciated. Cor: PMI nondisplaced. Regular rate & rhythm. 2/6 SEM RUSB.  Lungs: Clear Abdomen: Soft, nontender, nondistended. No hepatosplenomegaly. No bruits or masses. Good bowel sounds. Extremities: No cyanosis, clubbing, rash, edema Neuro: Alert & orientedx3, cranial nerves grossly intact. moves all 4 extremities w/o difficulty. Affect pleasant   Telemetry   NSR 70s (personally reviewed)  Labs    CBC Recent Labs    09/02/21 0418 09/03/21 0209  WBC 12.0* 4.1  NEUTROABS 11.2*  --   HGB 13.2 13.0  HCT 41.0 40.8  MCV 97.9 99.0  PLT 120* 89*   Basic Metabolic Panel Recent Labs    09/02/21 0418 09/03/21 0209  NA 137 134*  K 4.2  3.6  CL 102 99  CO2 27 27  GLUCOSE 125* 149*  BUN 20 15  CREATININE 1.30* 0.93  CALCIUM 8.3* 8.4*  MG 2.1 2.2  PHOS 2.9  --    Liver Function Tests Recent Labs    09/01/21 1423  AST 23  ALT 20  ALKPHOS 57  BILITOT 1.1  PROT 7.4  ALBUMIN 3.8   No results for input(s): LIPASE, AMYLASE in the last 72 hours. Cardiac Enzymes No results for input(s): CKTOTAL, CKMB, CKMBINDEX, TROPONINI in the last 72 hours.  BNP: BNP (last 3 results) Recent Labs    06/19/21 0957 09/01/21 1713 09/02/21 0526  BNP 235.5* 130.8* 231.9*    ProBNP (last 3 results) No results for input(s): PROBNP in the last 8760 hours.   D-Dimer Recent Labs    09/01/21 1713  DDIMER 2.52*   Hemoglobin A1C No results for input(s): HGBA1C in the last 72 hours. Fasting Lipid Panel No results for input(s): CHOL, HDL, LDLCALC, TRIG, CHOLHDL, LDLDIRECT in the last 72 hours. Thyroid Function Tests No results for input(s): TSH, T4TOTAL, T3FREE, THYROIDAB in the last 72 hours.  Invalid input(s): FREET3  Other results:   Imaging    ECHOCARDIOGRAM COMPLETE  Result Date: 09/02/2021    ECHOCARDIOGRAM REPORT   Patient Name:   [name] Date of Exam: 09/02/2021 Medical Rec #:  [id number]  Height:       66.0 in Accession #:    [id number]      Weight:       239.9 lb Date of Birth:  May 11, 1975       BSA:          2.160 m Patient Age:    57 years        BP:           115/74 mmHg Patient Gender: M               HR:           93 bpm. Exam Location:  Inpatient Procedure: 2D Echo, Cardiac Doppler, Color Doppler and Intracardiac            Opacification Agent Indications:     Congestive heart failure  History:         Patient has prior history of Echocardiogram examinations, most                  recent 06/20/2021. Pacemaker and Prior CABG; Risk Factors:Former                  Smoker.  Sonographer:     Joette Catching RCS Referring Phys:  [id number] GRACE E BOWSER Diagnosing Phys: Gwyndolyn Kaufman MD  Sonographer  Comments: Patient is morbidly obese and Technically difficult study due to poor echo windows. Image acquisition challenging due to patient body habitus. IMPRESSIONS  1. Left ventricular ejection fraction, by estimation, is 35%. The left ventricle has moderately decreased function. The left ventricle demonstrates global hypokinesis. Left ventricular diastolic parameters are indeterminate. No residual VSD visualized by color doppler.  2. Right ventricular systolic function is normal. The right ventricular size is moderately enlarged. There is mildly elevated pulmonary artery systolic pressure. The estimated right ventricular systolic pressure is 69.4 mmHg.  3. The mitral valve is normal in structure. Trivial mitral valve regurgitation.  4. The aortic valve is tricuspid. Aortic valve regurgitation is not visualized.  5. The inferior vena cava is dilated in size with >50% respiratory variability, suggesting right atrial pressure of 8 mmHg.  6. No significant pulmonic stenosis. Peak gradient 38mHg. Comparison(s): Compared to prior TTE in 06/2021, there is no significant change. FINDINGS  Left Ventricle: No residual VSD visualized. Left ventricular ejection fraction, by estimation, is 35%. The left ventricle has severely decreased function. The left ventricle demonstrates global hypokinesis. Definity contrast agent was given IV to delineate the left ventricular endocardial borders. The left ventricular internal cavity size was normal in size. There is no left ventricular hypertrophy. Abnormal (paradoxical) septal motion consistent with post-operative status. Left ventricular diastolic parameters are indeterminate. Right Ventricle: The right ventricular size is moderately enlarged. No increase in right ventricular wall thickness. Right ventricular systolic function is normal. There is mildly elevated pulmonary artery systolic pressure. The tricuspid regurgitant velocity is 2.65 m/s, and with an assumed right atrial  pressure of 8 mmHg, the estimated right ventricular systolic pressure is 385.4mmHg. Left Atrium: Left atrial size was normal in size. Right Atrium: Right atrial size was normal in size. Pericardium: There is no evidence of pericardial effusion. Mitral Valve: The mitral valve is normal in structure. Trivial mitral valve regurgitation. Tricuspid Valve: The tricuspid valve is normal in structure. Tricuspid valve regurgitation is trivial. Aortic Valve: The aortic valve is tricuspid. Aortic valve regurgitation is not visualized. Aortic valve mean gradient measures 5.0 mmHg. Aortic valve peak gradient measures 7.3 mmHg. Aortic valve area, by  VTI measures 4.05 cm. Pulmonic Valve: The pulmonic valve was normal in structure. Pulmonic valve regurgitation is trivial. Aorta: The aortic root and ascending aorta are structurally normal, with no evidence of dilitation. Pulmonary Artery: No significant pulmonic stenosis. Peak gradient 16mHg. Venous: The inferior vena cava is dilated in size with greater than 50% respiratory variability, suggesting right atrial pressure of 8 mmHg. IAS/Shunts: The atrial septum is grossly normal. Additional Comments: A device lead is visualized.  LEFT VENTRICLE PLAX 2D LVIDd:         5.35 cm      Diastology LVIDs:         3.75 cm      LV e' medial:    7.62 cm/s LV PW:         1.10 cm      LV E/e' medial:  17.7 LV IVS:        1.30 cm      LV e' lateral:   11.00 cm/s LVOT diam:     2.50 cm      LV E/e' lateral: 12.3 LV SV:         91 LV SV Index:   42 LVOT Area:     4.91 cm  LV Volumes (MOD) LV vol d, MOD A2C: 145.0 ml LV vol d, MOD A4C: 104.0 ml LV vol s, MOD A2C: 65.7 ml LV vol s, MOD A4C: 41.3 ml LV SV MOD A2C:     79.3 ml LV SV MOD A4C:     104.0 ml LV SV MOD BP:      72.0 ml RIGHT VENTRICLE RV Basal diam:  5.50 cm RV Mid diam:    4.40 cm TAPSE (M-mode): 1.7 cm LEFT ATRIUM           Index        RIGHT ATRIUM           Index LA diam:      2.60 cm 1.20 cm/m   RA Area:     16.30 cm LA Vol (A2C):  30.2 ml 13.98 ml/m  RA Volume:   38.00 ml  17.59 ml/m LA Vol (A4C): 31.8 ml 14.72 ml/m  AORTIC VALVE                     PULMONIC VALVE AV Area (Vmax):    3.82 cm      PV Vmax:       2.10 m/s AV Area (Vmean):   3.69 cm      PV Peak grad:  17.6 mmHg AV Area (VTI):     4.05 cm AV Vmax:           135.00 cm/s AV Vmean:          104.000 cm/s AV VTI:            0.224 m AV Peak Grad:      7.3 mmHg AV Mean Grad:      5.0 mmHg LVOT Vmax:         105.00 cm/s LVOT Vmean:        78.200 cm/s LVOT VTI:          0.185 m LVOT/AV VTI ratio: 0.83  AORTA Ao Root diam: 2.80 cm Ao Asc diam:  3.50 cm MITRAL VALVE                TRICUSPID VALVE MV Area (PHT): 9.25 cm     TR Peak grad:   28.1 mmHg MV Decel  Time: 82 msec      TR Vmax:        265.00 cm/s MV E velocity: 135.00 cm/s MV A velocity: 132.00 cm/s  SHUNTS MV E/A ratio:  1.02         Systemic VTI:  0.18 m                             Systemic Diam: 2.50 cm Gwyndolyn Kaufman MD Electronically signed by Gwyndolyn Kaufman MD Signature Date/Time: 09/02/2021/11:02:10 AM    Final (Updated)      Medications:     Scheduled Medications:  arformoterol  15 mcg Nebulization BID   aspirin  81 mg Oral Daily   budesonide (PULMICORT) nebulizer solution  0.25 mg Nebulization BID   Chlorhexidine Gluconate Cloth  6 each Topical Daily   dapagliflozin propanediol  10 mg Oral Daily   enoxaparin (LOVENOX) injection  40 mg Subcutaneous Q24H   furosemide  80 mg Intravenous Once   insulin aspart  0-9 Units Subcutaneous Q4H   mouth rinse  15 mL Mouth Rinse BID   simvastatin  20 mg Oral QHS   spironolactone  12.5 mg Oral Daily    Infusions:  sodium chloride 10 mL/hr at 09/03/21 0700    ceFAZolin (ANCEF) IV Stopped (09/03/21 0558)   norepinephrine (LEVOPHED) Adult infusion Stopped (09/01/21 2209)    PRN Medications: docusate sodium, ipratropium-albuterol, ondansetron (ZOFRAN) IV, polyethylene glycol    Assessment/Plan   1. Septic shock with MSSA bacteremia: ?Source from RLE  cellulitis.  PCT 25 initially with high fever and hypotension requiring norepinephrine, now off pressor.  Blood cultures 5/21 with MSSA.  Has St Jude ICD.  - Plan for TEE tomorrow morning to assess for endocarditis.  - Consult EP regarding ICD, with MSSA may need removal of system.  2. Acute on chronic systolic CHF: Nonischemic cardiomyopathy.  RHC in 4/16 showed preserved cardiac index by thermodilution but low by Fick.   Last echo in 8/21 showed EF 30-35%, normal RV, no residual VSD, no definite pulmonic stenosis.  Echo in 3/23 showed EF 30-35%, mild LVH, no VSD, no significant pulmonary stenosis, moderate RVE with normal RV systolic function. LHC/RHC in 3/23 with no coronary disease, CI 3.28.  He has a [employer] ICD.  Echo this admission looked similar to 3/23, no definite vegetation noted.  Mild volume overload on exam.  Now off pressors.  - Stop midodrine, BP stable.  - Lasix 80 mg IV x 1 and follow I/Os.  - Hold Coreg 3.125 mg bid.  - Hold Entresto 49/51 bid. - Restart spironolactone 25 mg daily.  - Restart digoxin 0.125 daily.   - Restart Farxiga 10 mg daily.   - May need ICD out, see above.  3. H/o VSD: No residual shunt noted 3/23 echo.  Last RHC also did not suggest any significant left to right shunting.  4. Pulmonary stenosis: Very mild by 7/16 echo, no significant stenosis 3/23 or 5/23 echoes.  Mild PS murmur on exam.  5. OSA: CPAP at night.   Length of Stay: 2  [name], MD  09/03/2021, 8:18 AM  Advanced Heart Failure Team Pager [phone] (M-F; 7a - 5p)  Please contact [facility] for night-coverage after hours (5p -7a ) and weekends on [email or web address]

## 2021-09-03 NOTE — Progress Notes (Signed)
[facility] ADULT ICU REPLACEMENT PROTOCOL   The patient does apply for the [facility] Adult ICU Electrolyte Replacment Protocol based on the criteria listed below:   1.Exclusion criteria: TCTS patients, ECMO patients, and Dialysis patients 2. Is GFR >/= 30 ml/min? Yes.    Patient's GFR today is >60 3. Is SCr </= 2? Yes.   Patient's SCr is 0.93 mg/dL 4. Did SCr increase >/= 0.5 in 24 hours? No. 5.Pt's weight >40kg  Yes.   6. Abnormal electrolyte(s): K+ 3.6  7. Electrolytes replaced per protocol 8.  Call MD STAT for K+ </= 2.5, Phos </= 1, or Mag </= 1 Physician:  Judieth Keens 09/03/2021 3:36 AM

## 2021-09-03 NOTE — Progress Notes (Signed)
RN placed pt on device with 2LPM bleed in.  RT will cont to monitor.

## 2021-09-03 NOTE — Progress Notes (Signed)
NAME:  Kevin Cardenas, MRN:  [id number], DOB:  Dec 17, 1975, LOS: 2 ADMISSION DATE:  09/01/2021, CONSULTATION DATE:  09/01/2021 REFERRING MD:  EDP, CHIEF COMPLAINT:  hypotension   History of Present Illness:  Kevin Cardenas is a 46 y.o. male with PMH of VSD repair and removal of a pulmonary artery band at age 46, DM, HTN, systolic heart failure, NICM and OSA diagnosed in past but not on CPAP. Has St Jude ICD (placed 01/2015). CPX in 10/20 showed moderate functional limitation primarily due to restrictive lung disease.    Pt. Presented 5/21 to [facility] ED  with sudden onset of chills, dyspnea, no cough.  EMS reports sats were 92% and placed on oxygen at 4 l/m. In the ED he was febrile to 103.1 , Lactate was 2.0, and he was hypotensive into the 40'J systolic . He received 2 L of fluid in the ED, with maintenance LR of 125/hr. He recently, within the week,  had repair of RLE varicose vein that had been bleeding by his PCP.  Suspect this area could be source of infection cellulitis. Pt was started on Levophed in the ED for his hypotension. He will be admitted to the ICU Until he is hemodynamically stable. PCCM to admit and manage care.  Pertinent  Medical History   Past Medical History:  Diagnosis Date   Chronic systolic CHF (congestive heart failure) (HCC)    EF 25% by echo 06/2018   Diabetes mellitus    Hypertension    Non-ischemic cardiomyopathy ([place])    Obstructive sleep apnea    VSD (ventricular septal defect) 1978   repaired at age [facility] Events: Including procedures, antibiotic start and stop dates in addition to other pertinent events   5/21 Admit to PCCM, started on peripheral pressors 5/22 off pressors  Interim History / Subjective:  No acute issues overnight. Eating breakfast this morning, reports he is doing well.   Objective   Blood pressure 128/84, pulse 95, temperature 98.6 F (37 C), temperature source Oral, resp. rate (!) 27, weight 108.8 kg, SpO2 96 %.         Intake/Output Summary (Last 24 hours) at 09/03/2021 0823 Last data filed at 09/03/2021 0700 Gross per 24 hour  Intake 944.81 ml  Output 2575 ml  Net -1630.19 ml    Filed Weights   09/02/21 0500 09/03/21 0500  Weight: 108.8 kg 108.8 kg   Examination: General: Resting in bed in no acute distress HENT: Normocephalic, atraumatic, moist mucous membranes Lungs: Normal respiratory effort on nasal cannula. Clear to ausculation bilaterally. Cardiovascular: Regular rate, rhythm. No murmurs appreciated. Distal pulses 2+ bilaterally. Abdomen: Soft, non-tender, non-distended. Normoactive bowel sounds. Extremities: Warm, dry. Bandage over RLE. No peripheral edema appreciated. Neuro: Awake, alert, conversing appropriately. Grossly non-focal, moving all extremities.  Resolved Hospital Problems:  Acute kidney injury   Assessment & Plan:  #Septic shock 2/2 MSSA bacteremia likely due to RLE cellulitis Continues to be hemodynamically stable off of pressors. Repeat blood cultures no growth thus far. Planning for TEE tomorrow. Of note, patient does have ICD, will plan for EP consult to discuss if needs to be removed. Planning to move to floor today.  - Continue cefazolin (day 2) - Pending TEE tomorrow - Follow-up ID recommendations  #Acute on chronic systolic heart failure #S/P ICD placement (2016) #Pulmonary hypertension #Congenital VSD s/p repair HF team following per family request, appreciate their recommendations. Renal function back to baseline this morning. Had good response to diuresis  yesterday, will plan for more today. Per HF will re-start some of his home goal-directed medical therapy today. Will also consult EP for ICD assistance.  - IV lasix 42m  - Re-start home Farxiga - Re-start spironolactone 285mdaily - Re-start digoxin 0.12548maily - Stop midodrine - Hold home Entresto, carvedilol - EP consult pending - Goal K>4, Mg>2  #OSA - CPAP QHS  #Hyperlipidemia - Restart  home simvastatin  *Of note, he is chronically on aspirin, which appears to be for primary prevention. Would consider discussing stopping this further with patient's PCP at discharge.   Best Practice (right click and "Reselect all SmartList Selections" daily)   Diet/type: Regular consistency (see orders) DVT prophylaxis: LMWH GI prophylaxis: N/A Lines: N/A Foley:  N/A Code Status:  full code Last date of multidisciplinary goals of care discussion [ will update wife this morning ]  Labs   CBC: Recent Labs  Lab 09/01/21 1320 09/01/21 1341 09/02/21 0418 09/03/21 0209  WBC 7.9  --  12.0* 4.1  NEUTROABS  --   --  11.2*  --   HGB 14.2 14.3 13.2 13.0  HCT 43.0 42.0 41.0 40.8  MCV 96.0  --  97.9 99.0  PLT PLATELET CLUMPS NOTED ON SMEAR, UNABLE TO ESTIMATE  --  120* 89*     Basic Metabolic Panel: Recent Labs  Lab 09/01/21 1341 09/01/21 1423 09/02/21 0418 09/03/21 0209  NA 135 135 137 134*  K 4.0 4.3 4.2 3.6  CL  --  98 102 99  CO2  --  _0 GLUCOSE  --  113* 125* 149*  BUN  --  22* 20 15  CREATININE  --  1.51* 1.30* 0.93  CALCIUM  --  8.7* 8.3* 8.4*  MG  --   --  2.1 2.2  PHOS  --   --  2.9  --     GFR: Estimated Creatinine Clearance: 114.8 mL/min (by C-G formula based on SCr of 0.93 mg/dL). Recent Labs  Lab 09/01/21 1320 09/01/21 1331 09/01/21 1423 09/01/21 1713 09/01/21 2155 09/02/21 0418 09/03/21 0209  PROCALCITON  --   --   --  24.67  --   --   --   WBC 7.9  --   --   --   --  12.0* 4.1  LATICACIDVEN  --  1.8 2.0*  --  1.2  --   --      Liver Function Tests: Recent Labs  Lab 09/01/21 1423  AST 23  ALT 20  ALKPHOS 57  BILITOT 1.1  PROT 7.4  ALBUMIN 3.8    No results for input(s): LIPASE, AMYLASE in the last 168 hours. No results for input(s): AMMONIA in the last 168 hours.  ABG    Component Value Date/Time   PHART 7.266 (L) 06/19/2021 1620   PCO2ART 83.1 (HH) 06/19/2021 1620   PO2ART 79 (L) 06/19/2021 1620   HCO3 29.7 (H)  09/01/2021 1713   TCO2 31 09/01/2021 1341   ACIDBASEDEF 2.0 08/03/2014 1417   O2SAT 92.2 09/01/2021 1713      Coagulation Profile: Recent Labs  Lab 09/01/21 1320  INR 1.1     Cardiac Enzymes: No results for input(s): CKTOTAL, CKMB, CKMBINDEX, TROPONINI in the last 168 hours.  HbA1C: Hgb A1c MFr Bld  Date/Time Value Ref Range Status  06/20/2021 03:55 AM 6.1 (H) 4.8 - 5.6 % Final    Comment:    (NOTE) Pre diabetes:  5.7%-6.4%  Diabetes:              >6.4%  Glycemic control for   <7.0% adults with diabetes   04/17/2021 10:47 AM 6.4 (H) 4.8 - 5.6 % Final    Comment:             Prediabetes: 5.7 - 6.4          Diabetes: >6.4          Glycemic control for adults with diabetes: <7.0     CBG: Recent Labs  Lab 09/02/21 1501 09/02/21 1923 09/02/21 2315 09/03/21 0409 09/03/21 0729  GLUCAP 138* 154* 147* 141* 137*     Review of Systems:   Review of Systems - General ROS: negative for - chills, fever, or malaise Respiratory ROS: no cough, shortness of breath, or wheezing Cardiovascular ROS: no chest pain or dyspnea on exertion Gastrointestinal ROS: no abdominal pain, change in bowel habits, or black or bloody stools   Past Medical History:  He,  has a past medical history of Chronic systolic CHF (congestive heart failure) ([place]), Diabetes mellitus, Hypertension, Non-ischemic cardiomyopathy ([place]), Obstructive sleep apnea, and VSD (ventricular septal defect) (1978).   Surgical History:   Past Surgical History:  Procedure Laterality Date   EP IMPLANTABLE DEVICE N/A 01/19/2015   Procedure: ICD Implant;  Surgeon: Deboraha Sprang, MD;  Location: [place];  Service: Cardiovascular;  Laterality: N/A;   RIGHT HEART CATHETERIZATION N/A 08/03/2014   Procedure: RIGHT HEART CATH;  Surgeon: Larey Dresser, MD;  Location: [facility] CATH LAB;  Service: Cardiovascular;  Laterality: N/A;   RIGHT/LEFT HEART CATH AND CORONARY ANGIOGRAPHY N/A 06/24/2021   Procedure:  RIGHT/LEFT HEART CATH AND CORONARY ANGIOGRAPHY;  Surgeon: Larey Dresser, MD;  Location: [place] [facility];  Service: Cardiovascular;  Laterality: N/A;   VSD REPAIR       Social History:   reports that he quit smoking about 10 years ago. His smoking use included cigarettes. He has a 4.50 pack-year smoking history. He has never used smokeless tobacco. He reports that he does not drink alcohol and does not use drugs.   Family History:  His family history includes Diabetes in his maternal grandfather and paternal grandmother; Lung cancer in his father; Stroke in his father and mother. There is no history of Cancer, Alcohol abuse, Drug abuse, Early death, Heart disease, Hyperlipidemia, Hypertension, or Kidney disease.   Allergies Allergies  Allergen Reactions   Metolazone Other (See Comments)    "Dried up my kidneys- took only one tablet"     Home Medications  Prior to Admission medications   Medication Sig Start Date End Date Taking? Authorizing Provider  ascorbic acid (VITAMIN C) 500 MG tablet Take 500-1,000 mg by mouth daily.    [provider]  aspirin 81 MG chewable tablet Chew 1 tablet (81 mg total) by mouth daily. 04/11/12   Ripley Fraise, MD  carvedilol (COREG) 3.125 MG tablet Take 1 tablet (3.125 mg total) by mouth 2 (two) times daily with a meal. 06/24/21   Joette Catching, PA-C  dapagliflozin propanediol (FARXIGA) 10 MG TABS tablet Take 1 tablet (10 mg total) by mouth daily. 08/28/21   Joette Catching, PA-C  digoxin (LANOXIN) 0.125 MG tablet Take 1 tablet (125 mcg total) by mouth daily. 08/19/21   Larey Dresser, MD  fluticasone furoate-vilanterol (BREO ELLIPTA) 100-25 MCG/ACT AEPB Inhale 1 puff into the lungs daily. 07/17/21   Dorna Mai, MD  potassium chloride SA (  KLOR-CON M) 20 MEQ tablet Take 1 tablet (20 mEq total) by mouth daily. 08/19/21   Larey Dresser, MD  sacubitril-valsartan (ENTRESTO) 49-51 MG Take 1 tablet by mouth 2 (two) times daily.  07/03/21   Larey Dresser, MD  simvastatin (ZOCOR) 20 MG tablet Take 1 tablet (20 mg total) by mouth at bedtime. 03/01/21   Larey Dresser, MD  spironolactone (ALDACTONE) 25 MG tablet Take 1 tablet (25 mg total) by mouth daily. 06/24/21   Joette Catching, PA-C  torsemide (DEMADEX) 20 MG tablet Take 2 tablets (40 mg total) by mouth every morning AND 1 tablet (20 mg total) every evening. Patient not taking: Reported on 08/21/2021 07/16/21   Larey Dresser, MD  torsemide (DEMADEX) 20 MG tablet Take 1 tablet (20 mg total) by mouth 2 (two) times daily. 08/19/21   Larey Dresser, MD     Critical care time: 25 minutes    Sanjuan Dame, MD Internal Medicine Resident PGY-2 Pager: [phone]

## 2021-09-03 NOTE — Progress Notes (Signed)
Patient ID: Kevin Cardenas, male   DOB: [birth year], 46 y.o.   MRN: [id number]     Advanced Heart Failure Rounding Note  PCP-Cardiologist: [name], MD   Subjective:    No complaints this morning.  Tm 99.4.  BP stable on low dose midodrine.  Good diuresis with dose of IV Lasix yesterday.   Echo: EF 35%, moderate RV enlargement, normal RV function, no significant pulmonary valve stenosis noted, no obvious vegetation.   Objective:   Weight Range: 108.8 kg Body mass index is 38.71 kg/m.   Vital Signs:   Temp:  [98.6 F (37 C)-100.3 F (37.9 C)] 98.6 F (37 C) (05/23 0731) Pulse Rate:  [87-107] 95 (05/23 0702) Resp:  [15-27] 27 (05/23 0702) BP: (102-143)/(62-118) 128/84 (05/23 0700) SpO2:  [64 %-99 %] 96 % (05/23 0702) Weight:  [108.8 kg] 108.8 kg (05/23 0500) Last BM Date : 09/02/21  Weight change: Filed Weights   09/02/21 0500 09/03/21 0500  Weight: 108.8 kg 108.8 kg    Intake/Output:   Intake/Output Summary (Last 24 hours) at 09/03/2021 0818 Last data filed at 09/03/2021 0700 Gross per 24 hour  Intake 944.81 ml  Output 2575 ml  Net -1630.19 ml      Physical Exam    General:  Well appearing. No resp difficulty HEENT: Normal Neck: Supple. JVP 8-9 cm. Carotids 2+ bilat; no bruits. No lymphadenopathy or thyromegaly appreciated. Cor: PMI nondisplaced. Regular rate & rhythm. 2/6 SEM RUSB.  Lungs: Clear Abdomen: Soft, nontender, nondistended. No hepatosplenomegaly. No bruits or masses. Good bowel sounds. Extremities: No cyanosis, clubbing, rash, edema Neuro: Alert & orientedx3, cranial nerves grossly intact. moves all 4 extremities w/o difficulty. Affect pleasant   Telemetry   NSR 70s (personally reviewed)  Labs    CBC Recent Labs    09/02/21 0418 09/03/21 0209  WBC 12.0* 4.1  NEUTROABS 11.2*  --   HGB 13.2 13.0  HCT 41.0 40.8  MCV 97.9 99.0  PLT 120* 89*   Basic Metabolic Panel Recent Labs    09/02/21 0418 09/03/21 0209  NA 137 134*  K 4.2  3.6  CL 102 99  CO2 27 27  GLUCOSE 125* 149*  BUN 20 15  CREATININE 1.30* 0.93  CALCIUM 8.3* 8.4*  MG 2.1 2.2  PHOS 2.9  --    Liver Function Tests Recent Labs    09/01/21 1423  AST 23  ALT 20  ALKPHOS 57  BILITOT 1.1  PROT 7.4  ALBUMIN 3.8   No results for input(s): LIPASE, AMYLASE in the last 72 hours. Cardiac Enzymes No results for input(s): CKTOTAL, CKMB, CKMBINDEX, TROPONINI in the last 72 hours.  BNP: BNP (last 3 results) Recent Labs    06/19/21 0957 09/01/21 1713 09/02/21 0526  BNP 235.5* 130.8* 231.9*    ProBNP (last 3 results) No results for input(s): PROBNP in the last 8760 hours.   D-Dimer Recent Labs    09/01/21 1713  DDIMER 2.52*   Hemoglobin A1C No results for input(s): HGBA1C in the last 72 hours. Fasting Lipid Panel No results for input(s): CHOL, HDL, LDLCALC, TRIG, CHOLHDL, LDLDIRECT in the last 72 hours. Thyroid Function Tests No results for input(s): TSH, T4TOTAL, T3FREE, THYROIDAB in the last 72 hours.  Invalid input(s): FREET3  Other results:   Imaging    ECHOCARDIOGRAM COMPLETE  Result Date: 09/02/2021    ECHOCARDIOGRAM REPORT   Patient Name:   Kevin Cardenas Date of Exam: 09/02/2021 Medical Rec #:  [id number]  Height:       66.0 in Accession #:    [id number]      Weight:       239.9 lb Date of Birth:  04/19/1975       BSA:          2.160 m Patient Age:    45 years        BP:           115/74 mmHg Patient Gender: M               HR:           93 bpm. Exam Location:  Inpatient Procedure: 2D Echo, Cardiac Doppler, Color Doppler and Intracardiac            Opacification Agent Indications:     Congestive heart failure  History:         Patient has prior history of Echocardiogram examinations, most                  recent 06/20/2021. Pacemaker and Prior CABG; Risk Factors:Former                  Smoker.  Sonographer:     Desiree Vazquez RCS Referring Phys:  [id number] GRACE E BOWSER Diagnosing Phys: Heather Pemberton MD  Sonographer  Comments: Patient is morbidly obese and Technically difficult study due to poor echo windows. Image acquisition challenging due to patient body habitus. IMPRESSIONS  1. Left ventricular ejection fraction, by estimation, is 35%. The left ventricle has moderately decreased function. The left ventricle demonstrates global hypokinesis. Left ventricular diastolic parameters are indeterminate. No residual VSD visualized by color doppler.  2. Right ventricular systolic function is normal. The right ventricular size is moderately enlarged. There is mildly elevated pulmonary artery systolic pressure. The estimated right ventricular systolic pressure is 36.1 mmHg.  3. The mitral valve is normal in structure. Trivial mitral valve regurgitation.  4. The aortic valve is tricuspid. Aortic valve regurgitation is not visualized.  5. The inferior vena cava is dilated in size with >50% respiratory variability, suggesting right atrial pressure of 8 mmHg.  6. No significant pulmonic stenosis. Peak gradient 6mmHg. Comparison(s): Compared to prior TTE in 06/2021, there is no significant change. FINDINGS  Left Ventricle: No residual VSD visualized. Left ventricular ejection fraction, by estimation, is 35%. The left ventricle has severely decreased function. The left ventricle demonstrates global hypokinesis. Definity contrast agent was given IV to delineate the left ventricular endocardial borders. The left ventricular internal cavity size was normal in size. There is no left ventricular hypertrophy. Abnormal (paradoxical) septal motion consistent with post-operative status. Left ventricular diastolic parameters are indeterminate. Right Ventricle: The right ventricular size is moderately enlarged. No increase in right ventricular wall thickness. Right ventricular systolic function is normal. There is mildly elevated pulmonary artery systolic pressure. The tricuspid regurgitant velocity is 2.65 m/s, and with an assumed right atrial  pressure of 8 mmHg, the estimated right ventricular systolic pressure is 36.1 mmHg. Left Atrium: Left atrial size was normal in size. Right Atrium: Right atrial size was normal in size. Pericardium: There is no evidence of pericardial effusion. Mitral Valve: The mitral valve is normal in structure. Trivial mitral valve regurgitation. Tricuspid Valve: The tricuspid valve is normal in structure. Tricuspid valve regurgitation is trivial. Aortic Valve: The aortic valve is tricuspid. Aortic valve regurgitation is not visualized. Aortic valve mean gradient measures 5.0 mmHg. Aortic valve peak gradient measures 7.3 mmHg. Aortic valve area, by   VTI measures 4.05 cm. Pulmonic Valve: The pulmonic valve was normal in structure. Pulmonic valve regurgitation is trivial. Aorta: The aortic root and ascending aorta are structurally normal, with no evidence of dilitation. Pulmonary Artery: No significant pulmonic stenosis. Peak gradient 6mmHg. Venous: The inferior vena cava is dilated in size with greater than 50% respiratory variability, suggesting right atrial pressure of 8 mmHg. IAS/Shunts: The atrial septum is grossly normal. Additional Comments: A device lead is visualized.  LEFT VENTRICLE PLAX 2D LVIDd:         5.35 cm      Diastology LVIDs:         3.75 cm      LV e' medial:    7.62 cm/s LV PW:         1.10 cm      LV E/e' medial:  17.7 LV IVS:        1.30 cm      LV e' lateral:   11.00 cm/s LVOT diam:     2.50 cm      LV E/e' lateral: 12.3 LV SV:         91 LV SV Index:   42 LVOT Area:     4.91 cm  LV Volumes (MOD) LV vol d, MOD A2C: 145.0 ml LV vol d, MOD A4C: 104.0 ml LV vol s, MOD A2C: 65.7 ml LV vol s, MOD A4C: 41.3 ml LV SV MOD A2C:     79.3 ml LV SV MOD A4C:     104.0 ml LV SV MOD BP:      72.0 ml RIGHT VENTRICLE RV Basal diam:  5.50 cm RV Mid diam:    4.40 cm TAPSE (M-mode): 1.7 cm LEFT ATRIUM           Index        RIGHT ATRIUM           Index LA diam:      2.60 cm 1.20 cm/m   RA Area:     16.30 cm LA Vol (A2C):  30.2 ml 13.98 ml/m  RA Volume:   38.00 ml  17.59 ml/m LA Vol (A4C): 31.8 ml 14.72 ml/m  AORTIC VALVE                     PULMONIC VALVE AV Area (Vmax):    3.82 cm      PV Vmax:       2.10 m/s AV Area (Vmean):   3.69 cm      PV Peak grad:  17.6 mmHg AV Area (VTI):     4.05 cm AV Vmax:           135.00 cm/s AV Vmean:          104.000 cm/s AV VTI:            0.224 m AV Peak Grad:      7.3 mmHg AV Mean Grad:      5.0 mmHg LVOT Vmax:         105.00 cm/s LVOT Vmean:        78.200 cm/s LVOT VTI:          0.185 m LVOT/AV VTI ratio: 0.83  AORTA Ao Root diam: 2.80 cm Ao Asc diam:  3.50 cm MITRAL VALVE                TRICUSPID VALVE MV Area (PHT): 9.25 cm     TR Peak grad:   28.1 mmHg MV Decel   Time: 82 msec      TR Vmax:        265.00 cm/s MV E velocity: 135.00 cm/s MV A velocity: 132.00 cm/s  SHUNTS MV E/A ratio:  1.02         Systemic VTI:  0.18 m                             Systemic Diam: 2.50 cm Gwyndolyn Kaufman MD Electronically signed by Gwyndolyn Kaufman MD Signature Date/Time: 09/02/2021/11:02:10 AM    Final (Updated)      Medications:     Scheduled Medications:  arformoterol  15 mcg Nebulization BID   aspirin  81 mg Oral Daily   budesonide (PULMICORT) nebulizer solution  0.25 mg Nebulization BID   Chlorhexidine Gluconate Cloth  6 each Topical Daily   dapagliflozin propanediol  10 mg Oral Daily   enoxaparin (LOVENOX) injection  40 mg Subcutaneous Q24H   furosemide  80 mg Intravenous Once   insulin aspart  0-9 Units Subcutaneous Q4H   mouth rinse  15 mL Mouth Rinse BID   simvastatin  20 mg Oral QHS   spironolactone  12.5 mg Oral Daily    Infusions:  sodium chloride 10 mL/hr at 09/03/21 0700    ceFAZolin (ANCEF) IV Stopped (09/03/21 0558)   norepinephrine (LEVOPHED) Adult infusion Stopped (09/01/21 2209)    PRN Medications: docusate sodium, ipratropium-albuterol, ondansetron (ZOFRAN) IV, polyethylene glycol    Assessment/Plan   1. Septic shock with MSSA bacteremia: ?Source from RLE  cellulitis.  PCT 25 initially with high fever and hypotension requiring norepinephrine, now off pressor.  Blood cultures 5/21 with MSSA.  Has [employer] ICD.  - Plan for TEE tomorrow morning to assess for endocarditis.  - Consult EP regarding ICD, with MSSA may need removal of system.  2. Acute on chronic systolic CHF: Nonischemic cardiomyopathy.  RHC in 4/16 showed preserved cardiac index by thermodilution but low by Fick.   Last echo in 8/21 showed EF 30-35%, normal RV, no residual VSD, no definite pulmonic stenosis.  Echo in 3/23 showed EF 30-35%, mild LVH, no VSD, no significant pulmonary stenosis, moderate RVE with normal RV systolic function. LHC/RHC in 3/23 with no coronary disease, CI 3.28.  He has a [employer] ICD.  Echo this admission looked similar to 3/23, no definite vegetation noted.  Mild volume overload on exam.  Now off pressors.  - Stop midodrine, BP stable.  - Lasix 80 mg IV x 1 and follow I/Os.  - Hold Coreg 3.125 mg bid.  - Hold Entresto 49/51 bid. - Restart spironolactone 25 mg daily.  - Restart digoxin 0.125 daily.   - Restart Farxiga 10 mg daily.   - May need ICD out, see above.  3. H/o VSD: No residual shunt noted 3/23 echo.  Last RHC also did not suggest any significant left to right shunting.  4. Pulmonary stenosis: Very mild by 7/16 echo, no significant stenosis 3/23 or 5/23 echoes.  Mild PS murmur on exam.  5. OSA: CPAP at night.   Length of Stay: 2  [name], MD  09/03/2021, 8:18 AM  Advanced Heart Failure Team Pager [phone] (M-F; 7a - 5p)  Please contact [facility] for night-coverage after hours (5p -7a ) and weekends on [email or web address]

## 2021-09-03 NOTE — Progress Notes (Signed)
[facility]  Date of Admission:  09/01/2021      Total days of antibiotics 2   Cefazolin          ASSESSMENT: Kevin Cardenas is a 46 y.o. male admitted with sepsis; found to have low-grade bacteremia in 1/2 sites drawn. Cardiac device in place since 2016 and w/o any local signs of infection. Planned TEE tomorrow AM to get a better look at [facility] for assessment of endocarditis. Bacteremia ? secondary to cellulitis associated with a chronic venous/varicose ulcer on the right LE.   Continue cefazolin, follow pending repeated blood cultures. EP consult requested given MSSA bacteremia with device in place.    PLAN: Follow repeat blood cultures  Continue cefazolin  Follow for TEE Follow for EP eval    Principal Problem:   MSSA bacteremia Active Problems:   Septic shock (HCC)   Cardiac device in situ    arformoterol  15 mcg Nebulization BID   aspirin  81 mg Oral Daily   budesonide (PULMICORT) nebulizer solution  0.25 mg Nebulization BID   Chlorhexidine Gluconate Cloth  6 each Topical Daily   dapagliflozin propanediol  10 mg Oral Daily   digoxin  0.125 mg Oral Daily   enoxaparin (LOVENOX) injection  40 mg Subcutaneous Q24H   insulin aspart  0-9 Units Subcutaneous Q4H   mouth rinse  15 mL Mouth Rinse BID   simvastatin  20 mg Oral QHS   spironolactone  12.5 mg Oral Daily    SUBJECTIVE: Feels much better today. Still on some oxygen via nazal cannula. Wife at the bedside today.  She says that just prior to hospitalization he did have some new redness around his chronic / intermittently opened leg ulcer on the right lateral aspect.   Afebrile, no s/e with antibiotics.    Review of Systems: Review of Systems  Constitutional:  Negative for chills and fever.  Respiratory:  Positive for shortness of breath. Negative for cough.   Cardiovascular:  Negative for chest pain.  Gastrointestinal:  Negative for abdominal pain, nausea and  vomiting.  Genitourinary:  Negative for dysuria.  Musculoskeletal:  Negative for joint pain.  Skin:  Negative for rash.  Neurological:  Negative for dizziness and headaches.    Allergies  Allergen Reactions   Metolazone Other (See Comments)    "Dried up my kidneys- took only one tablet"    OBJECTIVE: Vitals:   09/03/21 0731 09/03/21 0800 09/03/21 0858 09/03/21 0900  BP:  114/72  115/74  Pulse:  92  92  Resp:      Temp: 98.6 F (37 C)     TempSrc: Oral     SpO2:  96% 97% 97%  Weight:       Body mass index is 38.71 kg/m.  Physical Exam Constitutional:      Appearance: He is well-developed. He is not ill-appearing.  Cardiovascular:     Rate and Rhythm: Normal rate and regular rhythm.  Pulmonary:     Effort: Pulmonary effort is normal.     Breath sounds: Normal breath sounds.  Chest:     Comments: Left chest ppm pocket well healed and without any sx of local infection.  Abdominal:     Palpations: Abdomen is soft.  Skin:    General: Skin is warm and dry.     Capillary Refill: Capillary refill takes less than 2 seconds.     Comments: Small open ulcer on the right  lateral aspect of lower leg. Clean with some bloody drainage.   Neurological:     Mental Status: He is alert and oriented to person, place, and time.    Lab Results Lab Results  Component Value Date   WBC 4.1 09/03/2021   HGB 13.0 09/03/2021   HCT 40.8 09/03/2021   MCV 99.0 09/03/2021   PLT 89 (L) 09/03/2021    Lab Results  Component Value Date   CREATININE 0.93 09/03/2021   BUN 15 09/03/2021   NA 134 (L) 09/03/2021   K 3.6 09/03/2021   CL 99 09/03/2021   CO2 27 09/03/2021    Lab Results  Component Value Date   ALT 20 09/01/2021   AST 23 09/01/2021   ALKPHOS 57 09/01/2021   BILITOT 1.1 09/01/2021     Microbiology: Recent Results (from the past 240 hour(s))  Resp Panel by RT-PCR (Flu A&B, Covid) Nasopharyngeal Swab     Status: None   Collection Time: 09/01/21  1:24 PM   Specimen:  Nasopharyngeal Swab; Nasopharyngeal(NP) swabs in vial transport medium  Result Value Ref Range Status   SARS Coronavirus 2 by RT PCR NEGATIVE NEGATIVE Final    Comment: (NOTE) SARS-CoV-2 target nucleic acids are NOT DETECTED.  The SARS-CoV-2 RNA is generally detectable in upper respiratory specimens during the acute phase of infection. The lowest concentration of SARS-CoV-2 viral copies this assay can detect is 138 copies/mL. A negative result does not preclude SARS-Cov-2 infection and should not be used as the sole basis for treatment or other patient management decisions. A negative result may occur with  improper specimen collection/handling, submission of specimen other than nasopharyngeal swab, presence of viral mutation(s) within the areas targeted by this assay, and inadequate number of viral copies(<138 copies/mL). A negative result must be combined with clinical observations, patient history, and epidemiological information. The expected result is Negative.  Fact Sheet for Patients:  EntrepreneurPulse.com.au  Fact Sheet for Healthcare Providers:  IncredibleEmployment.be  This test is no t yet approved or cleared by the [place] FDA and  has been authorized for detection and/or diagnosis of SARS-CoV-2 by FDA under an Emergency Use Authorization (EUA). This EUA will remain  in effect (meaning this test can be used) for the duration of the COVID-19 declaration under Section 564(b)(1) of the Act, 21 U.S.C.section 360bbb-3(b)(1), unless the authorization is terminated  or revoked sooner.       Influenza A by PCR NEGATIVE NEGATIVE Final   Influenza B by PCR NEGATIVE NEGATIVE Final    Comment: (NOTE) The Xpert Xpress SARS-CoV-2/FLU/RSV plus assay is intended as an aid in the diagnosis of influenza from Nasopharyngeal swab specimens and should not be used as a sole basis for treatment. Nasal washings and aspirates are unacceptable for  Xpert Xpress SARS-CoV-2/FLU/RSV testing.  Fact Sheet for Patients: EntrepreneurPulse.com.au  Fact Sheet for Healthcare Providers: IncredibleEmployment.be  This test is not yet approved or cleared by the [place] FDA and has been authorized for detection and/or diagnosis of SARS-CoV-2 by FDA under an Emergency Use Authorization (EUA). This EUA will remain in effect (meaning this test can be used) for the duration of the COVID-19 declaration under Section 564(b)(1) of the Act, 21 U.S.C. section 360bbb-3(b)(1), unless the authorization is terminated or revoked.  Performed at [facility], [place] [address] [place], [place] [address]   Blood Culture (routine x 2)     Status: Abnormal (Preliminary result)   Collection Time: 09/01/21  1:24 PM   Specimen: BLOOD  Result Value Ref Range Status   Specimen Description BLOOD LEFT ANTECUBITAL  Final   Special Requests   Final    BOTTLES DRAWN AEROBIC AND ANAEROBIC Blood Culture adequate volume   Culture  Setup Time   Final    GRAM POSITIVE COCCI IN CLUSTERS ANAEROBIC BOTTLE ONLY IN BOTH AEROBIC AND ANAEROBIC BOTTLES CRITICAL RESULT CALLED TO, READ BACK BY AND VERIFIED WITH: PHARMD ADRIENNE WELLBORN 09/02/21_0 :21 BY TW    Culture (A)  Final    STAPHYLOCOCCUS AUREUS SUSCEPTIBILITIES TO FOLLOW Performed at [facility], [place] [address], [place], [place] [address]    Report Status PENDING  Incomplete  Urine Culture     Status: None   Collection Time: 09/01/21  1:24 PM   Specimen: In/Out Cath Urine  Result Value Ref Range Status   Specimen Description IN/OUT CATH URINE  Final   Special Requests NONE  Final   Culture   Final    NO GROWTH Performed at [facility], [place] [address] [place], [place] [address]    Report Status 09/02/2021 FINAL  Final  Blood Culture ID Panel (Reflexed)     Status: Abnormal   Collection Time: 09/01/21  1:24 PM  Result Value Ref Range Status    Enterococcus faecalis NOT DETECTED NOT DETECTED Final   Enterococcus Faecium NOT DETECTED NOT DETECTED Final   Listeria monocytogenes NOT DETECTED NOT DETECTED Final   Staphylococcus species DETECTED (A) NOT DETECTED Final    Comment: CRITICAL RESULT CALLED TO, READ BACK BY AND VERIFIED WITH: PHARMD ADRIENNE WELLBORN 09/02/21_1 :21 BY TW    Staphylococcus aureus (BCID) DETECTED (A) NOT DETECTED Final    Comment: CRITICAL RESULT CALLED TO, READ BACK BY AND VERIFIED WITH: PHARMD ADRIENNE WELLBORN 09/02/21_2 :21 BY TW    Staphylococcus epidermidis NOT DETECTED NOT DETECTED Final   Staphylococcus lugdunensis NOT DETECTED NOT DETECTED Final   Streptococcus species NOT DETECTED NOT DETECTED Final   Streptococcus agalactiae NOT DETECTED NOT DETECTED Final   Streptococcus pneumoniae NOT DETECTED NOT DETECTED Final   Streptococcus pyogenes NOT DETECTED NOT DETECTED Final   A.calcoaceticus-baumannii NOT DETECTED NOT DETECTED Final   Bacteroides fragilis NOT DETECTED NOT DETECTED Final   Enterobacterales NOT DETECTED NOT DETECTED Final   Enterobacter cloacae complex NOT DETECTED NOT DETECTED Final   Escherichia coli NOT DETECTED NOT DETECTED Final   Klebsiella aerogenes NOT DETECTED NOT DETECTED Final   Klebsiella oxytoca NOT DETECTED NOT DETECTED Final   Klebsiella pneumoniae NOT DETECTED NOT DETECTED Final   Proteus species NOT DETECTED NOT DETECTED Final   Salmonella species NOT DETECTED NOT DETECTED Final   Serratia marcescens NOT DETECTED NOT DETECTED Final   Haemophilus influenzae NOT DETECTED NOT DETECTED Final   Neisseria meningitidis NOT DETECTED NOT DETECTED Final   Pseudomonas aeruginosa NOT DETECTED NOT DETECTED Final   Stenotrophomonas maltophilia NOT DETECTED NOT DETECTED Final   Candida albicans NOT DETECTED NOT DETECTED Final   Candida auris NOT DETECTED NOT DETECTED Final   Candida glabrata NOT DETECTED NOT DETECTED Final   Candida krusei NOT DETECTED NOT DETECTED Final    Candida parapsilosis NOT DETECTED NOT DETECTED Final   Candida tropicalis NOT DETECTED NOT DETECTED Final   Cryptococcus neoformans/gattii NOT DETECTED NOT DETECTED Final   Meth resistant mecA/C and MREJ NOT DETECTED NOT DETECTED Final    Comment: Performed at [facility], [address], [place], [place] [address]  Blood Culture (routine x 2)     Status: None (Preliminary result)  Collection Time: 09/01/21  5:13 PM   Specimen: BLOOD RIGHT HAND  Result Value Ref Range Status   Specimen Description BLOOD RIGHT HAND  Final   Special Requests   Final    BOTTLES DRAWN AEROBIC AND ANAEROBIC Blood Culture results may not be optimal due to an inadequate volume of blood received in culture bottles   Culture   Final    NO GROWTH 2 DAYS Performed at [facility], [place] [address] [place], [place] [address]    Report Status PENDING  Incomplete  MRSA Next Gen by PCR, Nasal     Status: None   Collection Time: 09/01/21  5:29 PM   Specimen: Nasal Mucosa; Nasal Swab  Result Value Ref Range Status   MRSA by PCR Next Gen NOT DETECTED NOT DETECTED Final    Comment: (NOTE) The GeneXpert MRSA Assay (FDA approved for NASAL specimens only), is one component of a comprehensive MRSA colonization surveillance program. It is not intended to diagnose MRSA infection nor to guide or monitor treatment for MRSA infections. Test performance is not FDA approved in patients less than 45 years old. Performed at [facility], [place] [address], [place], [place] [address]   Culture, blood (Routine X 2) w Reflex to ID Panel     Status: None (Preliminary result)   Collection Time: 09/02/21  9:38 AM   Specimen: BLOOD  Result Value Ref Range Status   Specimen Description BLOOD BLOOD LEFT FOREARM  Final   Special Requests   Final    BOTTLES DRAWN AEROBIC AND ANAEROBIC Blood Culture adequate volume   Culture   Final    NO GROWTH < 24 HOURS Performed at [facility], [place] [address]  [place], [place] [address]    Report Status PENDING  Incomplete  Culture, blood (Routine X 2) w Reflex to ID Panel     Status: None (Preliminary result)   Collection Time: 09/02/21  9:39 AM   Specimen: BLOOD  Result Value Ref Range Status   Specimen Description BLOOD BLOOD LEFT HAND  Final   Special Requests   Final    BOTTLES DRAWN AEROBIC AND ANAEROBIC Blood Culture adequate volume   Culture   Final    NO GROWTH < 24 HOURS Performed at [facility], [place] [address], [place], [place] [address]    Report Status PENDING  Incomplete     Janene Madeira, MSN, NP-C [facility] [email or web address] Pager: [phone] Office: [phone] [facility] Main Line: [place] Communication Welcome

## 2021-09-04 ENCOUNTER — Encounter (HOSPITAL_COMMUNITY): Payer: Self-pay | Admitting: Pulmonary Disease

## 2021-09-04 ENCOUNTER — Inpatient Hospital Stay (HOSPITAL_COMMUNITY): Payer: 59

## 2021-09-04 ENCOUNTER — Encounter (HOSPITAL_COMMUNITY)
Admission: EM | Disposition: A | Discharge status: Home Health | Payer: Self-pay | Source: Home / Self Care | Attending: Family Medicine

## 2021-09-04 ENCOUNTER — Inpatient Hospital Stay (HOSPITAL_COMMUNITY): Payer: 59 | Admitting: Anesthesiology

## 2021-09-04 DIAGNOSIS — R7881 Bacteremia: Secondary | ICD-10-CM

## 2021-09-04 DIAGNOSIS — I509 Heart failure, unspecified: Secondary | ICD-10-CM

## 2021-09-04 DIAGNOSIS — I4891 Unspecified atrial fibrillation: Secondary | ICD-10-CM

## 2021-09-04 DIAGNOSIS — B9561 Methicillin susceptible Staphylococcus aureus infection as the cause of diseases classified elsewhere: Secondary | ICD-10-CM

## 2021-09-04 DIAGNOSIS — I370 Nonrheumatic pulmonary valve stenosis: Secondary | ICD-10-CM

## 2021-09-04 DIAGNOSIS — I110 Hypertensive heart disease with heart failure: Secondary | ICD-10-CM

## 2021-09-04 HISTORY — PX: TEE WITHOUT CARDIOVERSION: SHX5443

## 2021-09-04 LAB — PREPARE RBC (CROSSMATCH)

## 2021-09-04 LAB — CULTURE, BLOOD (ROUTINE X 2): Special Requests: ADEQUATE

## 2021-09-04 LAB — BASIC METABOLIC PANEL
Anion gap: 9 (ref 5–15)
BUN: 11 mg/dL (ref 6–20)
CO2: 29 mmol/L (ref 22–32)
Calcium: 8.6 mg/dL — ABNORMAL LOW (ref 8.9–10.3)
Chloride: 97 mmol/L — ABNORMAL LOW (ref 98–111)
Creatinine, Ser: 0.95 mg/dL (ref 0.61–1.24)
GFR, Estimated: >60 mL/min (ref >60)
Glucose, Bld: 115 mg/dL — ABNORMAL HIGH (ref 70–99)
Potassium: 3.9 mmol/L (ref 3.5–5.1)
Sodium: 135 mmol/L (ref 135–145)

## 2021-09-04 LAB — CBC
HCT: 40.8 % (ref 39.0–52.0)
Hemoglobin: 13.1 g/dL (ref 13.0–17.0)
MCH: 31.3 pg (ref 26.0–34.0)
MCHC: 32.1 g/dL (ref 30.0–36.0)
MCV: 97.4 fL (ref 80.0–100.0)
Platelets: 97 10*3/uL — ABNORMAL LOW (ref 150–400)
RBC: 4.19 MIL/uL — ABNORMAL LOW (ref 4.22–5.81)
RDW: 14.4 % (ref 11.5–15.5)
WBC: 4.5 10*3/uL (ref 4.0–10.5)
nRBC: 0 % (ref 0.0–0.2)

## 2021-09-04 LAB — GLUCOSE, CAPILLARY
Glucose-Capillary: 113 mg/dL — ABNORMAL HIGH (ref 70–99)
Glucose-Capillary: 118 mg/dL — ABNORMAL HIGH (ref 70–99)
Glucose-Capillary: 100 mg/dL — ABNORMAL HIGH (ref 70–99)
Glucose-Capillary: 154 mg/dL — ABNORMAL HIGH (ref 70–99)

## 2021-09-04 LAB — MAGNESIUM: Magnesium: 2.2 mg/dL (ref 1.7–2.4)

## 2021-09-04 SURGERY — ECHOCARDIOGRAM, TRANSESOPHAGEAL
Anesthesia: Monitor Anesthesia Care

## 2021-09-04 MED ORDER — PROPOFOL 10 MG/ML IV BOLUS
INTRAVENOUS | Status: DC | PRN
  Administered 2021-09-04 (×2): 20 mg via INTRAVENOUS
  Administered 2021-09-04: 10 mg via INTRAVENOUS

## 2021-09-04 MED ORDER — SODIUM CHLORIDE 0.9 % IV SOLN: INTRAVENOUS | Status: DC

## 2021-09-04 MED ORDER — CEFAZOLIN SODIUM-DEXTROSE 2-4 GM/100ML-% IV SOLN
2.0000 g | INTRAVENOUS | Status: AC
  Filled 2021-09-04: qty 100

## 2021-09-04 MED ORDER — SACUBITRIL-VALSARTAN 24-26 MG PO TABS
1.0000 | ORAL_TABLET | Freq: Two times a day (BID) | ORAL | Status: DC
  Administered 2021-09-04 – 2021-09-10 (×10): 1 via ORAL
  Filled 2021-09-04 (×14): qty 1

## 2021-09-04 MED ORDER — PROPOFOL 500 MG/50ML IV EMUL
INTRAVENOUS | Status: DC | PRN
  Administered 2021-09-04: 100 ug/kg/min via INTRAVENOUS

## 2021-09-04 MED ORDER — CARVEDILOL 3.125 MG PO TABS
3.1250 mg | ORAL_TABLET | Freq: Two times a day (BID) | ORAL | Status: DC
  Administered 2021-09-04 – 2021-09-08 (×8): 3.125 mg via ORAL
  Filled 2021-09-04 (×9): qty 1

## 2021-09-04 MED ORDER — CHLORHEXIDINE GLUCONATE 4 % EX LIQD
60.0000 mL | Freq: Once | CUTANEOUS | Status: AC
  Administered 2021-09-04: 4 via TOPICAL
  Filled 2021-09-04 (×2): qty 60

## 2021-09-04 MED ORDER — SODIUM CHLORIDE 0.9 % IV SOLN
80.0000 mg | INTRAVENOUS | Status: AC
  Filled 2021-09-04: qty 2

## 2021-09-04 MED ORDER — LIDOCAINE 2% (20 MG/ML) 5 ML SYRINGE
INTRAMUSCULAR | Status: DC | PRN
  Administered 2021-09-04: 100 mg via INTRAVENOUS

## 2021-09-04 MED ORDER — CHLORHEXIDINE GLUCONATE 4 % EX LIQD
60.0000 mL | Freq: Once | CUTANEOUS | Status: AC
  Administered 2021-09-05: 4 via TOPICAL
  Filled 2021-09-04: qty 60

## 2021-09-04 MED ORDER — TORSEMIDE 20 MG PO TABS
40.0000 mg | ORAL_TABLET | Freq: Every morning | ORAL | Status: DC
  Administered 2021-09-04 – 2021-09-10 (×7): 40 mg via ORAL
  Filled 2021-09-04 (×7): qty 2

## 2021-09-04 MED ORDER — TORSEMIDE 20 MG PO TABS
20.0000 mg | ORAL_TABLET | Freq: Every evening | ORAL | Status: DC
  Administered 2021-09-04 – 2021-09-08 (×4): 20 mg via ORAL
  Filled 2021-09-04 (×4): qty 1

## 2021-09-04 MED ORDER — SPIRONOLACTONE 25 MG PO TABS
25.0000 mg | ORAL_TABLET | Freq: Every day | ORAL | Status: DC
  Administered 2021-09-04 – 2021-09-08 (×5): 25 mg via ORAL
  Filled 2021-09-04 (×5): qty 1

## 2021-09-04 NOTE — CV Procedure (Signed)
Procedure: TEE  Sedation: Per anesthesiology  Indication: Endocarditis  Findings: Please see echo section for full report.  Normal LV size with mild LV hypertrophy.  EF 35-40% with diffuse hypokinesis.  Mildly dilated RV with moderately decreased systolic function.  ICD lead noted in right heart.  There was a 0.5 x 0.9 cm vegetation on the lead in the right atrium.  Mild left atrial enlargement, mild right atrial enlargement.  There was a PFO noted by color doppler.  Trivial TR, peak RV-RA gradient 17 mmHg.  No TV vegetation.  There was turbulence across the pulmonic valve with mild pulmonic stenosis (peak gradient 13 mmHg) and no pulmonary regurgitation. No PV vegetation.  No residual VSD was noted.  Normal mitral valve with no MR, no MV vegetation. Trileaflet aortic valve with no stenosis or regurgitation, no AoV vegetation.   Impression: ICD infection with vegetation noted on the lead in the RA.    Kevin Cardenas 09/04/2021 8:10 AM

## 2021-09-04 NOTE — Progress Notes (Signed)
Patient ID: Kevin Cardenas, male   DOB: [birth year], 46 y.o.   MRN: [id number]     Advanced Heart Failure Rounding Note  PCP-Cardiologist: [name], MD   Subjective:    No complaints this morning.  Tm 99.1.  Good diuresis with dose of IV Lasix yesterday, I/Os net negative -3679.   Echo: EF 35%, moderate RV enlargement, normal RV function, no significant pulmonary valve stenosis noted, no obvious vegetation.   TEE: LV EF 35-40%, mild RV dilation with moderate RV dysfunction, mild PS (mean gradient 13 mmHg), no VSD, there is a small 0.5 x 0.9 cm vegetation on the ICD lead as it traverses the RA, no valvular vegetation noted.   Objective:   Weight Range: 108 kg Body mass index is 38.43 kg/m.   Vital Signs:   Temp:  [96.8 F (36 C)-99.1 F (37.3 C)] 96.8 F (36 C) (05/24 0705) Pulse Rate:  [85-101] 89 (05/24 0705) Resp:  [15-22] 16 (05/24 0705) BP: (78-144)/(51-92) 128/77 (05/24 0705) SpO2:  [89 %-98 %] 95 % (05/24 0705) Weight:  [108 kg] 108 kg (05/24 0705) Last BM Date : 09/02/21  Weight change: Filed Weights   09/03/21 0500 09/04/21 0500 09/04/21 0705  Weight: 108.8 kg 108 kg 108 kg    Intake/Output:   Intake/Output Summary (Last 24 hours) at 09/04/2021 0811 Last data filed at 09/04/2021 7124 Gross per 24 hour  Intake 736.28 ml  Output 4225 ml  Net -3488.72 ml      Physical Exam    General: NAD Neck: No JVD, no thyromegaly or thyroid nodule.  Lungs: Clear to auscultation bilaterally with normal respiratory effort. CV: Nondisplaced PMI.  Heart regular S1/S2, no S3/S4, 2/6 SEM upper sternal border.  No peripheral edema.   Abdomen: Soft, nontender, no hepatosplenomegaly, no distention.  Skin: Intact without lesions or rashes.  Neurologic: Alert and oriented x 3.  Psych: Normal affect. Extremities: No clubbing or cyanosis.  HEENT: Normal.    Telemetry   NSR 70s (personally reviewed)  Labs    CBC Recent Labs    09/02/21 0418 09/03/21 0209  09/04/21 0258  WBC 12.0* 4.1 4.5  NEUTROABS 11.2*  --   --   HGB 13.2 13.0 13.1  HCT 41.0 40.8 40.8  MCV 97.9 99.0 97.4  PLT 120* 89* 97*   Basic Metabolic Panel Recent Labs    09/02/21 0418 09/03/21 0209 09/04/21 0258  NA 137 134* 135  K 4.2 3.6 3.9  CL 102 99 97*  CO2 _0 GLUCOSE 125* 149* 115*  BUN _1 CREATININE 1.30* 0.93 0.95  CALCIUM 8.3* 8.4* 8.6*  MG 2.1 2.2 2.2  PHOS 2.9  --   --    Liver Function Tests Recent Labs    09/01/21 1423  AST 23  ALT 20  ALKPHOS 57  BILITOT 1.1  PROT 7.4  ALBUMIN 3.8   No results for input(s): LIPASE, AMYLASE in the last 72 hours. Cardiac Enzymes No results for input(s): CKTOTAL, CKMB, CKMBINDEX, TROPONINI in the last 72 hours.  BNP: BNP (last 3 results) Recent Labs    06/19/21 0957 09/01/21 1713 09/02/21 0526  BNP 235.5* 130.8* 231.9*    ProBNP (last 3 results) No results for input(s): PROBNP in the last 8760 hours.   D-Dimer Recent Labs    09/01/21 1713  DDIMER 2.52*   Hemoglobin A1C No results for input(s): HGBA1C in the last 72 hours. Fasting Lipid Panel No results for input(s): CHOL, HDL,  LDLCALC, TRIG, CHOLHDL, LDLDIRECT in the last 72 hours. Thyroid Function Tests No results for input(s): TSH, T4TOTAL, T3FREE, THYROIDAB in the last 72 hours.  Invalid input(s): FREET3  Other results:   Imaging    No results found.   Medications:     Scheduled Medications:  [MAR Hold] arformoterol  15 mcg Nebulization BID   [MAR Hold] budesonide (PULMICORT) nebulizer solution  0.25 mg Nebulization BID   carvedilol  3.125 mg Oral BID WC   [MAR Hold] Chlorhexidine Gluconate Cloth  6 each Topical Daily   [MAR Hold] dapagliflozin propanediol  10 mg Oral Daily   [MAR Hold] digoxin  0.125 mg Oral Daily   [MAR Hold] insulin aspart  0-9 Units Subcutaneous TID WC   [MAR Hold] mouth rinse  15 mL Mouth Rinse BID   sacubitril-valsartan  1 tablet Oral BID   [MAR Hold] simvastatin  20 mg Oral QHS    spironolactone  25 mg Oral Daily   torsemide  20 mg Oral QPM   torsemide  40 mg Oral q morning    Infusions:  sodium chloride Stopped (09/04/21 0648)   sodium chloride     [MAR Hold]  ceFAZolin (ANCEF) IV Stopped (09/04/21 0641)    PRN Medications: [MAR Hold] acetaminophen, [MAR Hold] docusate sodium, [MAR Hold] ipratropium-albuterol, [MAR Hold] ondansetron (ZOFRAN) IV, [MAR Hold] polyethylene glycol    Assessment/Plan   1. Septic shock with MSSA bacteremia: ?Source from RLE cellulitis.  PCT 25 initially with high fever and hypotension requiring norepinephrine, now off pressor.  Blood cultures 5/21 with MSSA.  Has [employer] Jude ICD => TEE today showed 0.5 x 0.9 cm vegetation on the ICD lead as it traverses the RA. No valvular vegetation noted.  - Continue cefazolin, ID following. - Plan for ICD system removal tomorrow.   2. Acute on chronic systolic CHF: Nonischemic cardiomyopathy.  RHC in 4/16 showed preserved cardiac index by thermodilution but low by Fick.   Last echo in 8/21 showed EF 30-35%, normal RV, no residual VSD, no definite pulmonic stenosis.  Echo in 3/23 showed EF 30-35%, mild LVH, no VSD, no significant pulmonary stenosis, moderate RVE with normal RV systolic function. LHC/RHC in 3/23 with no coronary disease, CI 3.28.  He has a [employer] ICD.  Echo this admission looked similar to 3/23, no definite vegetation noted.  TEE showed LV EF 35-40%, mild RV dilation with moderate RV dysfunction, mild PS (mean gradient 13 mmHg), no VSD, there is a small 0.5 x 0.9 cm vegetation on the ICD lead as it traverses the RA, no valvular vegetation noted.  He diuresed well yesterday with IV Lasix, volume status looks ok today.  - Can restart home torsemide 40 qam/20 qpm.  - Restart Coreg 3.125 mg bid.  - Restart Entresto 24/26 bid (was on 49/51 bid at home, can increase to home dose prior to discharge). - Continue spironolactone 25 mg daily.  - Continue digoxin 0.125 daily.   - Continue Farxiga 10 mg  daily.   - Plan to remove ICD tomorrow with endocarditis.  LV EF looks 35-40% to me, may not need to replace.   3. H/o VSD: No residual shunt noted 3/23 echo.  Last RHC also did not suggest any significant left to right shunting. TEE today showed no definite VSD.  4. Pulmonary stenosis: Mild on TEE, peak gradient 13 mmHg.  5. OSA: CPAP at night.   Length of Stay: 3  [name], MD  09/04/2021, 8:11 AM  Advanced Heart  Failure Team Pager [phone] (M-F; 7a - 5p)  Please contact [facility] for night-coverage after hours (5p -7a ) and weekends on [email or web address]

## 2021-09-04 NOTE — Anesthesia Postprocedure Evaluation (Signed)
Anesthesia Post Note  Patient: Kevin Cardenas  Procedure(s) Performed: TRANSESOPHAGEAL ECHOCARDIOGRAM (TEE)     Patient location during evaluation: Endoscopy Anesthesia Type: MAC Level of consciousness: awake and alert, patient cooperative and oriented Pain management: pain level controlled Vital Signs Assessment: post-procedure vital signs reviewed and stable Respiratory status: nonlabored ventilation, spontaneous breathing, respiratory function stable and patient connected to nasal cannula oxygen Cardiovascular status: blood pressure returned to baseline and stable Postop Assessment: no apparent nausea or vomiting and able to ambulate Anesthetic complications: no   No notable events documented.  Last Vitals:  Vitals:   09/04/21 0815 09/04/21 0825  BP: 118/78 136/80  Pulse: 95 87  Resp: 17 17  Temp: 36.6 C   SpO2: 91% 95%    Last Pain:  Vitals:   09/04/21 0825  TempSrc:   PainSc: 0-No pain                 Cresencio Reesor,E. Gleb Mcguire

## 2021-09-04 NOTE — Progress Notes (Signed)
Pt being transferred straight from endo to 6N01. Report called and pt's cell phone, charger, and medications left with Engineer, manufacturing.

## 2021-09-04 NOTE — Progress Notes (Signed)
[facility]  Date of Admission:  09/01/2021      Total days of antibiotics 3   Cefazolin          ASSESSMENT: Kevin Cardenas is a 46 y.o. male admitted with sepsis; found to have low-grade bacteremia in 1/2 sites drawn. Cardiac device in place since 2016 and w/o any local signs of infection. TEE with small vegetation noted and plan for extraction. EF seems to have recovered enough to where he may not need re-implantation of the device immediately.   Continue cefazolin, follow pending repeated blood cultures. EP consult requested given MSSA bacteremia with device in place.    PLAN: Follow BCx for clearance  Device extraction planned for tomorrow Repeat blood cultures post extraction for completeness  PICC once PPM out and clearance proved  Continue cefazolin, planned 6 week course    Principal Problem:   MSSA bacteremia Active Problems:   Septic shock (HCC)   Cardiac device in situ    arformoterol  15 mcg Nebulization BID   budesonide (PULMICORT) nebulizer solution  0.25 mg Nebulization BID   carvedilol  3.125 mg Oral BID WC   Chlorhexidine Gluconate Cloth  6 each Topical Daily   dapagliflozin propanediol  10 mg Oral Daily   digoxin  0.125 mg Oral Daily   insulin aspart  0-9 Units Subcutaneous TID WC   mouth rinse  15 mL Mouth Rinse BID   sacubitril-valsartan  1 tablet Oral BID   simvastatin  20 mg Oral QHS   spironolactone  25 mg Oral Daily   torsemide  20 mg Oral QPM   torsemide  40 mg Oral q morning    SUBJECTIVE: Feels well. Had TEE today and says his pacer needs to come out.  Off oxygen,  Afebrile   Review of Systems: Review of Systems  Constitutional:  Negative for chills and fever.  Respiratory:  Negative for cough and shortness of breath.   Cardiovascular:  Negative for chest pain.  Gastrointestinal:  Negative for abdominal pain, nausea and vomiting.  Genitourinary:  Negative for dysuria.  Musculoskeletal:   Negative for joint pain.  Skin:  Negative for rash.  Neurological:  Negative for dizziness and headaches.    Allergies  Allergen Reactions   Metolazone Other (See Comments)    "Dried up my kidneys- took only one tablet"    OBJECTIVE: Vitals:   09/04/21 0705 09/04/21 0815 09/04/21 0825 09/04/21 0904  BP: 128/77 118/78 136/80 109/79  Pulse: 89 95 87 80  Resp: _0 Temp: (!) 96.8 F (36 C) 97.9 F (36.6 C)  99 F (37.2 C)  TempSrc: Temporal Temporal  Oral  SpO2: 95% 91% 95% 92%  Weight: 108 kg     Height: _1  (1.676 m)      Body mass index is 38.43 kg/m.  Physical Exam Constitutional:      Appearance: He is well-developed. He is not ill-appearing.  Cardiovascular:     Rate and Rhythm: Normal rate and regular rhythm.  Pulmonary:     Effort: Pulmonary effort is normal.     Breath sounds: Normal breath sounds.  Chest:     Comments: Left chest ppm pocket well healed and without any sx of local infection.  Abdominal:     Palpations: Abdomen is soft.  Skin:    General: Skin is warm and dry.     Capillary Refill: Capillary refill takes less than  2 seconds.     Comments: Small open ulcer on the right lateral aspect of lower leg. Clean with some bloody drainage.   Neurological:     Mental Status: He is alert and oriented to person, place, and time.    Lab Results Lab Results  Component Value Date   WBC 4.5 09/04/2021   HGB 13.1 09/04/2021   HCT 40.8 09/04/2021   MCV 97.4 09/04/2021   PLT 97 (L) 09/04/2021    Lab Results  Component Value Date   CREATININE 0.95 09/04/2021   BUN 11 09/04/2021   NA 135 09/04/2021   K 3.9 09/04/2021   CL 97 (L) 09/04/2021   CO2 29 09/04/2021    Lab Results  Component Value Date   ALT 20 09/01/2021   AST 23 09/01/2021   ALKPHOS 57 09/01/2021   BILITOT 1.1 09/01/2021     Microbiology: Recent Results (from the past 240 hour(s))  Resp Panel by RT-PCR (Flu A&B, Covid) Nasopharyngeal Swab     Status: None   Collection  Time: 09/01/21  1:24 PM   Specimen: Nasopharyngeal Swab; Nasopharyngeal(NP) swabs in vial transport medium  Result Value Ref Range Status   SARS Coronavirus 2 by RT PCR NEGATIVE NEGATIVE Final    Comment: (NOTE) SARS-CoV-2 target nucleic acids are NOT DETECTED.  The SARS-CoV-2 RNA is generally detectable in upper respiratory specimens during the acute phase of infection. The lowest concentration of SARS-CoV-2 viral copies this assay can detect is 138 copies/mL. A negative result does not preclude SARS-Cov-2 infection and should not be used as the sole basis for treatment or other patient management decisions. A negative result may occur with  improper specimen collection/handling, submission of specimen other than nasopharyngeal swab, presence of viral mutation(s) within the areas targeted by this assay, and inadequate number of viral copies(<138 copies/mL). A negative result must be combined with clinical observations, patient history, and epidemiological information. The expected result is Negative.  Fact Sheet for Patients:  EntrepreneurPulse.com.au  Fact Sheet for Healthcare Providers:  IncredibleEmployment.be  This test is no t yet approved or cleared by the [place] FDA and  has been authorized for detection and/or diagnosis of SARS-CoV-2 by FDA under an Emergency Use Authorization (EUA). This EUA will remain  in effect (meaning this test can be used) for the duration of the COVID-19 declaration under Section 564(b)(1) of the Act, 21 U.S.C.section 360bbb-3(b)(1), unless the authorization is terminated  or revoked sooner.       Influenza A by PCR NEGATIVE NEGATIVE Final   Influenza B by PCR NEGATIVE NEGATIVE Final    Comment: (NOTE) The Xpert Xpress SARS-CoV-2/FLU/RSV plus assay is intended as an aid in the diagnosis of influenza from Nasopharyngeal swab specimens and should not be used as a sole basis for treatment. Nasal washings  and aspirates are unacceptable for Xpert Xpress SARS-CoV-2/FLU/RSV testing.  Fact Sheet for Patients: EntrepreneurPulse.com.au  Fact Sheet for Healthcare Providers: IncredibleEmployment.be  This test is not yet approved or cleared by the [place] FDA and has been authorized for detection and/or diagnosis of SARS-CoV-2 by FDA under an Emergency Use Authorization (EUA). This EUA will remain in effect (meaning this test can be used) for the duration of the COVID-19 declaration under Section 564(b)(1) of the Act, 21 U.S.C. section 360bbb-3(b)(1), unless the authorization is terminated or revoked.  Performed at [facility], [place] [address], [place], [place] [address]   Blood Culture (routine x 2)     Status: Abnormal  Collection Time: 09/01/21  1:24 PM   Specimen: BLOOD  Result Value Ref Range Status   Specimen Description BLOOD LEFT ANTECUBITAL  Final   Special Requests   Final    BOTTLES DRAWN AEROBIC AND ANAEROBIC Blood Culture adequate volume   Culture  Setup Time   Final    GRAM POSITIVE COCCI IN CLUSTERS ANAEROBIC BOTTLE ONLY IN BOTH AEROBIC AND ANAEROBIC BOTTLES CRITICAL RESULT CALLED TO, READ BACK BY AND VERIFIED WITH: PHARMD ADRIENNE WELLBORN 09/02/21_0 :21 BY TW Performed at [facility], [place] [address], [place], [place] [address]    Culture STAPHYLOCOCCUS AUREUS (A)  Final   Report Status 09/04/2021 FINAL  Final   Organism ID, Bacteria STAPHYLOCOCCUS AUREUS  Final      Susceptibility   Staphylococcus aureus - MIC*    CIPROFLOXACIN <=0.5 SENSITIVE Sensitive     ERYTHROMYCIN <=0.25 SENSITIVE Sensitive     GENTAMICIN <=0.5 SENSITIVE Sensitive     OXACILLIN <=0.25 SENSITIVE Sensitive     TETRACYCLINE <=1 SENSITIVE Sensitive     VANCOMYCIN <=0.5 SENSITIVE Sensitive     TRIMETH/SULFA <=10 SENSITIVE Sensitive     CLINDAMYCIN <=0.25 SENSITIVE Sensitive     RIFAMPIN <=0.5 SENSITIVE Sensitive     Inducible Clindamycin  NEGATIVE Sensitive     * STAPHYLOCOCCUS AUREUS  Urine Culture     Status: None   Collection Time: 09/01/21  1:24 PM   Specimen: In/Out Cath Urine  Result Value Ref Range Status   Specimen Description IN/OUT CATH URINE  Final   Special Requests NONE  Final   Culture   Final    NO GROWTH Performed at [facility], [place] [address] [place], [place] [address]    Report Status 09/02/2021 FINAL  Final  Blood Culture ID Panel (Reflexed)     Status: Abnormal   Collection Time: 09/01/21  1:24 PM  Result Value Ref Range Status   Enterococcus faecalis NOT DETECTED NOT DETECTED Final   Enterococcus Faecium NOT DETECTED NOT DETECTED Final   Listeria monocytogenes NOT DETECTED NOT DETECTED Final   Staphylococcus species DETECTED (A) NOT DETECTED Final    Comment: CRITICAL RESULT CALLED TO, READ BACK BY AND VERIFIED WITH: PHARMD ADRIENNE WELLBORN 09/02/21_1 :21 BY TW    Staphylococcus aureus (BCID) DETECTED (A) NOT DETECTED Final    Comment: CRITICAL RESULT CALLED TO, READ BACK BY AND VERIFIED WITH: PHARMD ADRIENNE WELLBORN 09/02/21_2 :21 BY TW    Staphylococcus epidermidis NOT DETECTED NOT DETECTED Final   Staphylococcus lugdunensis NOT DETECTED NOT DETECTED Final   Streptococcus species NOT DETECTED NOT DETECTED Final   Streptococcus agalactiae NOT DETECTED NOT DETECTED Final   Streptococcus pneumoniae NOT DETECTED NOT DETECTED Final   Streptococcus pyogenes NOT DETECTED NOT DETECTED Final   A.calcoaceticus-baumannii NOT DETECTED NOT DETECTED Final   Bacteroides fragilis NOT DETECTED NOT DETECTED Final   Enterobacterales NOT DETECTED NOT DETECTED Final   Enterobacter cloacae complex NOT DETECTED NOT DETECTED Final   Escherichia coli NOT DETECTED NOT DETECTED Final   Klebsiella aerogenes NOT DETECTED NOT DETECTED Final   Klebsiella oxytoca NOT DETECTED NOT DETECTED Final   Klebsiella pneumoniae NOT DETECTED NOT DETECTED Final   Proteus species NOT DETECTED NOT DETECTED Final   Salmonella  species NOT DETECTED NOT DETECTED Final   Serratia marcescens NOT DETECTED NOT DETECTED Final   Haemophilus influenzae NOT DETECTED NOT DETECTED Final   Neisseria meningitidis NOT DETECTED NOT DETECTED Final   Pseudomonas aeruginosa NOT DETECTED NOT DETECTED Final   Stenotrophomonas maltophilia  NOT DETECTED NOT DETECTED Final   Candida albicans NOT DETECTED NOT DETECTED Final   Candida auris NOT DETECTED NOT DETECTED Final   Candida glabrata NOT DETECTED NOT DETECTED Final   Candida krusei NOT DETECTED NOT DETECTED Final   Candida parapsilosis NOT DETECTED NOT DETECTED Final   Candida tropicalis NOT DETECTED NOT DETECTED Final   Cryptococcus neoformans/gattii NOT DETECTED NOT DETECTED Final   Meth resistant mecA/C and MREJ NOT DETECTED NOT DETECTED Final    Comment: Performed at [facility], [place] [address], [place], [place] [address]  Blood Culture (routine x 2)     Status: None (Preliminary result)   Collection Time: 09/01/21  5:13 PM   Specimen: BLOOD RIGHT HAND  Result Value Ref Range Status   Specimen Description BLOOD RIGHT HAND  Final   Special Requests   Final    BOTTLES DRAWN AEROBIC AND ANAEROBIC Blood Culture results may not be optimal due to an inadequate volume of blood received in culture bottles   Culture   Final    NO GROWTH 3 DAYS Performed at [facility], [place] [address], [place], [place] [address]    Report Status PENDING  Incomplete  MRSA Next Gen by PCR, Nasal     Status: None   Collection Time: 09/01/21  5:29 PM   Specimen: Nasal Mucosa; Nasal Swab  Result Value Ref Range Status   MRSA by PCR Next Gen NOT DETECTED NOT DETECTED Final    Comment: (NOTE) The GeneXpert MRSA Assay (FDA approved for NASAL specimens only), is one component of a comprehensive MRSA colonization surveillance program. It is not intended to diagnose MRSA infection nor to guide or monitor treatment for MRSA infections. Test performance is not FDA approved in patients less  than 47 years old. Performed at [facility], [place] [address], [place], [place] [address]   Culture, blood (Routine X 2) w Reflex to ID Panel     Status: None (Preliminary result)   Collection Time: 09/02/21  9:38 AM   Specimen: BLOOD  Result Value Ref Range Status   Specimen Description BLOOD BLOOD LEFT FOREARM  Final   Special Requests   Final    BOTTLES DRAWN AEROBIC AND ANAEROBIC Blood Culture adequate volume   Culture   Final    NO GROWTH 2 DAYS Performed at [facility], [place] [address], [place], [place] [address]    Report Status PENDING  Incomplete  Culture, blood (Routine X 2) w Reflex to ID Panel     Status: None (Preliminary result)   Collection Time: 09/02/21  9:39 AM   Specimen: BLOOD  Result Value Ref Range Status   Specimen Description BLOOD BLOOD LEFT HAND  Final   Special Requests   Final    BOTTLES DRAWN AEROBIC AND ANAEROBIC Blood Culture adequate volume   Culture   Final    NO GROWTH 2 DAYS Performed at [facility], [place] [address] [place], [place] [address]    Report Status PENDING  Incomplete    Janene Madeira, MSN, NP-[facility] [email or web address] Pager: [phone] Office: [phone] [facility] Main Line: [place] Communication Welcome

## 2021-09-04 NOTE — Transfer of Care (Signed)
Immediate Anesthesia Transfer of Care Note  Patient: Kevin Cardenas  Procedure(s) Performed: TRANSESOPHAGEAL ECHOCARDIOGRAM (TEE)  Patient Location: Endoscopy Unit  Anesthesia Type:MAC  Level of Consciousness: awake and alert   Airway & Oxygen Therapy: Patient Spontanous Breathing and Patient connected to nasal cannula oxygen  Post-op Assessment: Report given to RN and Post -op Vital signs reviewed and stable  Post vital signs: Reviewed and stable  Last Vitals:  Vitals Value Taken Time  BP 118/78 09/04/21 0815  Temp    Pulse 95 09/04/21 0816  Resp 31 09/04/21 0816  SpO2 90 % 09/04/21 0815  Vitals shown include unvalidated device data.  Last Pain:  Vitals:   09/04/21 0705  TempSrc: Temporal  PainSc: 0-No pain         Complications: No notable events documented.

## 2021-09-04 NOTE — Progress Notes (Signed)
  Echocardiogram Echocardiogram Transesophageal has been performed.  Bobbye Charleston 09/04/2021, 8:19 AM

## 2021-09-04 NOTE — Interval H&P Note (Signed)
History and Physical Interval Note:  09/04/2021 7:53 AM  Kevin Cardenas  has presented today for surgery, with the diagnosis of BACTEREMIA.  The various methods of treatment have been discussed with the patient and family. After consideration of risks, benefits and other options for treatment, the patient has consented to  Procedure(s): TRANSESOPHAGEAL ECHOCARDIOGRAM (TEE) (N/A) as a surgical intervention.  The patient's history has been reviewed, patient examined, no change in status, stable for surgery.  I have reviewed the patient's chart and labs.  Questions were answered to the patient's satisfaction.     Kalyiah Saintil [employer]

## 2021-09-04 NOTE — Anesthesia Preprocedure Evaluation (Addendum)
Anesthesia Evaluation  Patient identified by MRN, date of birth, ID band Patient awake    Reviewed: Allergy & Precautions, NPO status , Patient's Chart, lab work & pertinent test results, reviewed documented beta blocker date and time   History of Anesthesia Complications Negative for: history of anesthetic complications  Airway Mallampati: I  TM Distance: >3 FB Neck ROM: Full    Dental  (+) Chipped, Missing, Dental Advisory Given, Poor Dentition   Pulmonary sleep apnea and Continuous Positive Airway Pressure Ventilation , COPD,  COPD inhaler, former smoker,    breath sounds clear to auscultation       Cardiovascular hypertension, Pt. on medications and Pt. on home beta blockers (-) angina+CHF (entresto)  (-) CAD + dysrhythmias Atrial Fibrillation + Cardiac Defibrillator + Valvular Problems/Murmurs (h/o VSD repair)  Rhythm:Regular Rate:Normal  09/02/2021 ECHO: EF 35%, mod decreased LVF, no resid VSD, normal RVF, trivial MR  06/2021 Cath: 1. Normal coronaries.  2. Normal filling pressures.  3. Normal cardiac output.  4. Mild pulmonary hypertension, suspect related to OHS/OSA.     Neuro/Psych negative neurological ROS     GI/Hepatic negative GI ROS, Neg liver ROS,   Endo/Other  diabetes (glu 136)Morbid obesity  Renal/GU Renal InsufficiencyRenal disease     Musculoskeletal   Abdominal (+) + obese,   Peds  Hematology plt 97k   Anesthesia Other Findings bacteremia  Reproductive/Obstetrics                            Anesthesia Physical Anesthesia Plan  ASA: 4  Anesthesia Plan: MAC   Post-op Pain Management: Minimal or no pain anticipated   Induction:   PONV Risk Score and Plan: 1 and Ondansetron and Treatment may vary due to age or medical condition  Airway Management Planned: Natural Airway and Nasal Cannula  Additional Equipment: None  Intra-op Plan:   Post-operative Plan:    Informed Consent: I have reviewed the patients History and Physical, chart, labs and discussed the procedure including the risks, benefits and alternatives for the proposed anesthesia with the patient or authorized representative who has indicated his/her understanding and acceptance.     Dental advisory given  Plan Discussed with: CRNA and Surgeon  Anesthesia Plan Comments:        Anesthesia Quick Evaluation

## 2021-09-04 NOTE — Anesthesia Procedure Notes (Signed)
Procedure Name: MAC Date/Time: 09/04/2021 7:48 AM Performed by: Dorann Lodge, CRNA Pre-anesthesia Checklist: Patient identified, Emergency Drugs available, Suction available and Patient being monitored Patient Re-evaluated:Patient Re-evaluated prior to induction Oxygen Delivery Method: Nasal cannula Airway Equipment and Method: Bite block

## 2021-09-04 NOTE — Progress Notes (Signed)
PROGRESS NOTE   Kevin Cardenas  JWJ:[id number] DOB: 06-13-1975 DOA: 09/01/2021 PCP: Dorna Mai, MD  Brief Narrative:  46 year old community dwelling black male VSD repair + pulmonary artery banding at age 52--NICM + [place] ICD 01/2015 with resulting HFrEF NYHA class II symptoms at follow-up 4/23 (last echo 3/20 = EF 25%)-follows chronically with AHF team-last OV apparently 20 pounds >usual OSA no CPAP-+ restrictive lung disease Recent varicose vein repair R LE  5/21: present [place] ed chills dyspnea febrile 103 lactic acidosis procalcitonin 24 systolic BP 70 Admitted by CCM-started on pressors-septic shock secondary to staph cellulitis RLE 5/22: ID consulted/AHF team consulted 5/22: Off pressors 5/24:Transfer to [facility] --- TEE showed infected pacemaker lead    Hospital-Problem based course  Sepsis 2/2 MSSA from PPM lead  Thrombocytopenia Device extraction planned 5.25 via EP Abx [Ancef currently] as per ID Has dental caries on tooth #2--will need definitive extraction--Dr. Benson Norway out of town until 5/29 per dental voicemail-I have left a message with schedulers to call me back when able VSD/Pulm banding HFrEF NYHA class II-III symptoms EF 25% NICM  [secondarily placed PPM due to NUICM?] Defer to cardiology: digoxin 0.125, Entresto-Aldactone 25 torsemide as per cardiology Coreg 3.125 twice daily Fluid status and management as per cardiology- On NS per Cards--probably saline lock postprocedure OSA not on CPAP with restrictive lung disease Continue Brovana Pulmicort DuoNeb-as an outpatient probably needs to titrate to a different device  DVT prophylaxis: SCD Code Status: Full Family Communication: Discussed with wife at bedside Disposition:  Status is: Inpatient Remains inpatient appropriate because:    Consultants:    Procedures:   Antimicrobials:     Subjective: Doing fair-understands need for procedure No distress nor chest pain  He tells me that he was in the ED  with an abscess of one of his molars about 3 months ago (I cannot find the ED visit so it may have been outside of our system-he tells me that the tooth split and he did not go to visit a dentist subsequently)  Objective: Vitals:   09/04/21 0319 09/04/21 0400 09/04/21 0500 09/04/21 0705  BP:  118/82  128/77  Pulse:  86  89  Resp:  17  16  Temp: 98.8 F (37.1 C)   (!) 96.8 F (36 C)  TempSrc: Oral   Temporal  SpO2:  97%  95%  Weight:   108 kg 108 kg  Height:    _0  (1.676 m)    Intake/Output Summary (Last 24 hours) at 09/04/2021 0717 Last data filed at 09/04/2021 0645 Gross per 24 hour  Intake 445.01 ml  Output 4225 ml  Net -3779.99 ml   Filed Weights   09/03/21 0500 09/04/21 0500 09/04/21 0705  Weight: 108.8 kg 108 kg 108 kg    Examination:  EOMI NCAT no focal deficit thick neck Mallampati 4 Patient does have a carious tooth at molar #2 No wheeze no rales no rhonchi Abdomen soft but obese nontender no rebound No lower extremity edema Neurologically intact no focal deficit   Data Reviewed: personally reviewed   CBC    Component Value Date/Time   WBC 4.5 09/04/2021 0258   RBC 4.19 (L) 09/04/2021 0258   HGB 13.1 09/04/2021 0258   HGB 14.4 07/24/2021 0939   HCT 40.8 09/04/2021 0258   HCT 41.9 07/24/2021 0939   PLT 97 (L) 09/04/2021 0258   PLT 182 07/24/2021 0939   MCV 97.4 09/04/2021 0258   MCV 93 07/24/2021 0939   [place]  31.3 09/04/2021 0258   MCHC 32.1 09/04/2021 0258   RDW 14.4 09/04/2021 0258   RDW 12.6 07/24/2021 0939   LYMPHSABS 0.3 (L) 09/02/2021 0418   LYMPHSABS 1.0 04/17/2021 1047   MONOABS 0.4 09/02/2021 0418   EOSABS 0.0 09/02/2021 0418   EOSABS 0.1 04/17/2021 1047   BASOSABS 0.0 09/02/2021 0418   BASOSABS 0.0 04/17/2021 1047      Latest Ref Rng & Units 09/04/2021    2:58 AM 09/03/2021    2:09 AM 09/02/2021    4:18 AM  CMP  Glucose 70 - 99 mg/dL 115   149   125    BUN 6 - 20 mg/dL _0 Creatinine 0.61 - 1.24 mg/dL 0.95   0.93    1.30    Sodium 135 - 145 mmol/L 135   134   137    Potassium 3.5 - 5.1 mmol/L 3.9   3.6   4.2    Chloride 98 - 111 mmol/L 97   99   102    CO2 22 - 32 mmol/L _1 Calcium 8.9 - 10.3 mg/dL 8.6   8.4   8.3       Radiology Studies: ECHOCARDIOGRAM COMPLETE  Result Date: 09/02/2021    ECHOCARDIOGRAM REPORT   Patient Name:   Kevin Cardenas Date of Exam: 09/02/2021 Medical Rec #:  [id number]       Height:       66.0 in Accession #:    [id number]      Weight:       239.9 lb Date of Birth:  1976-03-24       BSA:          2.160 m Patient Age:    14 years        BP:           115/74 mmHg Patient Gender: M               HR:           93 bpm. Exam Location:  Inpatient Procedure: 2D Echo, Cardiac Doppler, Color Doppler and Intracardiac            Opacification Agent Indications:     Congestive heart failure  History:         Patient has prior history of Echocardiogram examinations, most                  recent 06/20/2021. Pacemaker and Prior CABG; Risk Factors:Former                  Smoker.  Sonographer:     Joette Catching RCS Referring Phys:  [id number] GRACE E BOWSER Diagnosing Phys: Gwyndolyn Kaufman MD  Sonographer Comments: Patient is morbidly obese and Technically difficult study due to poor echo windows. Image acquisition challenging due to patient body habitus. IMPRESSIONS  1. Left ventricular ejection fraction, by estimation, is 35%. The left ventricle has moderately decreased function. The left ventricle demonstrates global hypokinesis. Left ventricular diastolic parameters are indeterminate. No residual VSD visualized by color doppler.  2. Right ventricular systolic function is normal. The right ventricular size is moderately enlarged. There is mildly elevated pulmonary artery systolic pressure. The estimated right ventricular systolic pressure is 91.2 mmHg.  3. The mitral valve is normal in structure. Trivial mitral valve regurgitation.  4. The aortic valve is tricuspid. Aortic valve regurgitation  is not  visualized.  5. The inferior vena cava is dilated in size with >50% respiratory variability, suggesting right atrial pressure of 8 mmHg.  6. No significant pulmonic stenosis. Peak gradient 34mHg. Comparison(s): Compared to prior TTE in 06/2021, there is no significant change. FINDINGS  Left Ventricle: No residual VSD visualized. Left ventricular ejection fraction, by estimation, is 35%. The left ventricle has severely decreased function. The left ventricle demonstrates global hypokinesis. Definity contrast agent was given IV to delineate the left ventricular endocardial borders. The left ventricular internal cavity size was normal in size. There is no left ventricular hypertrophy. Abnormal (paradoxical) septal motion consistent with post-operative status. Left ventricular diastolic parameters are indeterminate. Right Ventricle: The right ventricular size is moderately enlarged. No increase in right ventricular wall thickness. Right ventricular systolic function is normal. There is mildly elevated pulmonary artery systolic pressure. The tricuspid regurgitant velocity is 2.65 m/s, and with an assumed right atrial pressure of 8 mmHg, the estimated right ventricular systolic pressure is 333.8mmHg. Left Atrium: Left atrial size was normal in size. Right Atrium: Right atrial size was normal in size. Pericardium: There is no evidence of pericardial effusion. Mitral Valve: The mitral valve is normal in structure. Trivial mitral valve regurgitation. Tricuspid Valve: The tricuspid valve is normal in structure. Tricuspid valve regurgitation is trivial. Aortic Valve: The aortic valve is tricuspid. Aortic valve regurgitation is not visualized. Aortic valve mean gradient measures 5.0 mmHg. Aortic valve peak gradient measures 7.3 mmHg. Aortic valve area, by VTI measures 4.05 cm. Pulmonic Valve: The pulmonic valve was normal in structure. Pulmonic valve regurgitation is trivial. Aorta: The aortic root and ascending aorta  are structurally normal, with no evidence of dilitation. Pulmonary Artery: No significant pulmonic stenosis. Peak gradient 633mg. Venous: The inferior vena cava is dilated in size with greater than 50% respiratory variability, suggesting right atrial pressure of 8 mmHg. IAS/Shunts: The atrial septum is grossly normal. Additional Comments: A device lead is visualized.  LEFT VENTRICLE PLAX 2D LVIDd:         5.35 cm      Diastology LVIDs:         3.75 cm      LV e' medial:    7.62 cm/s LV PW:         1.10 cm      LV E/e' medial:  17.7 LV IVS:        1.30 cm      LV e' lateral:   11.00 cm/s LVOT diam:     2.50 cm      LV E/e' lateral: 12.3 LV SV:         91 LV SV Index:   42 LVOT Area:     4.91 cm  LV Volumes (MOD) LV vol d, MOD A2C: 145.0 ml LV vol d, MOD A4C: 104.0 ml LV vol s, MOD A2C: 65.7 ml LV vol s, MOD A4C: 41.3 ml LV SV MOD A2C:     79.3 ml LV SV MOD A4C:     104.0 ml LV SV MOD BP:      72.0 ml RIGHT VENTRICLE RV Basal diam:  5.50 cm RV Mid diam:    4.40 cm TAPSE (M-mode): 1.7 cm LEFT ATRIUM           Index        RIGHT ATRIUM           Index LA diam:      2.60 cm 1.20 cm/m   RA Area:  16.30 cm LA Vol (A2C): 30.2 ml 13.98 ml/m  RA Volume:   38.00 ml  17.59 ml/m LA Vol (A4C): 31.8 ml 14.72 ml/m  AORTIC VALVE                     PULMONIC VALVE AV Area (Vmax):    3.82 cm      PV Vmax:       2.10 m/s AV Area (Vmean):   3.69 cm      PV Peak grad:  17.6 mmHg AV Area (VTI):     4.05 cm AV Vmax:           135.00 cm/s AV Vmean:          104.000 cm/s AV VTI:            0.224 m AV Peak Grad:      7.3 mmHg AV Mean Grad:      5.0 mmHg LVOT Vmax:         105.00 cm/s LVOT Vmean:        78.200 cm/s LVOT VTI:          0.185 m LVOT/AV VTI ratio: 0.83  AORTA Ao Root diam: 2.80 cm Ao Asc diam:  3.50 cm MITRAL VALVE                TRICUSPID VALVE MV Area (PHT): 9.25 cm     TR Peak grad:   28.1 mmHg MV Decel Time: 82 msec      TR Vmax:        265.00 cm/s MV E velocity: 135.00 cm/s MV A velocity: 132.00 cm/s  SHUNTS MV  E/A ratio:  1.02         Systemic VTI:  0.18 m                             Systemic Diam: 2.50 cm Gwyndolyn Kaufman MD Electronically signed by Gwyndolyn Kaufman MD Signature Date/Time: 09/02/2021/11:02:10 AM    Final (Updated)      Scheduled Meds:  [MAR Hold] arformoterol  15 mcg Nebulization BID   [MAR Hold] budesonide (PULMICORT) nebulizer solution  0.25 mg Nebulization BID   [MAR Hold] Chlorhexidine Gluconate Cloth  6 each Topical Daily   [MAR Hold] dapagliflozin propanediol  10 mg Oral Daily   [MAR Hold] digoxin  0.125 mg Oral Daily   [MAR Hold] insulin aspart  0-9 Units Subcutaneous TID WC   [MAR Hold] mouth rinse  15 mL Mouth Rinse BID   [MAR Hold] simvastatin  20 mg Oral QHS   [MAR Hold] spironolactone  12.5 mg Oral Daily   Continuous Infusions:  sodium chloride Stopped (09/03/21 2239)   sodium chloride     [MAR Hold]  ceFAZolin (ANCEF) IV 2 g (09/04/21 0611)     LOS: 3 days   Time spent: Lynndyl, MD [facility] To contact the attending provider between 7A-7P or the covering provider during after hours 7P-7A, please log into the web site [email or web address] and access using universal [place] password for that web site. If you do not have the password, please call the hospital operator.  09/04/2021, 7:17 AM

## 2021-09-05 ENCOUNTER — Inpatient Hospital Stay (HOSPITAL_COMMUNITY): Payer: 59

## 2021-09-05 ENCOUNTER — Other Ambulatory Visit: Payer: Self-pay

## 2021-09-05 ENCOUNTER — Inpatient Hospital Stay (HOSPITAL_COMMUNITY): Payer: 59 | Admitting: Certified Registered Nurse Anesthetist

## 2021-09-05 ENCOUNTER — Encounter (HOSPITAL_COMMUNITY)
Admission: EM | Disposition: A | Discharge status: Home Health | Payer: Self-pay | Source: Home / Self Care | Attending: Family Medicine

## 2021-09-05 ENCOUNTER — Inpatient Hospital Stay (HOSPITAL_COMMUNITY): Admission: RE | Admit: 2021-09-05 | Payer: 59 | Source: Home / Self Care | Admitting: Cardiology

## 2021-09-05 ENCOUNTER — Encounter (HOSPITAL_COMMUNITY): Payer: Self-pay | Admitting: Cardiology

## 2021-09-05 ENCOUNTER — Other Ambulatory Visit (HOSPITAL_COMMUNITY): Payer: Self-pay

## 2021-09-05 DIAGNOSIS — R6521 Severe sepsis with septic shock: Secondary | ICD-10-CM

## 2021-09-05 DIAGNOSIS — I110 Hypertensive heart disease with heart failure: Secondary | ICD-10-CM

## 2021-09-05 DIAGNOSIS — T827XXA Infection and inflammatory reaction due to other cardiac and vascular devices, implants and grafts, initial encounter: Secondary | ICD-10-CM

## 2021-09-05 DIAGNOSIS — R7881 Bacteremia: Secondary | ICD-10-CM | POA: Diagnosis not present

## 2021-09-05 DIAGNOSIS — A4101 Sepsis due to Methicillin susceptible Staphylococcus aureus: Secondary | ICD-10-CM

## 2021-09-05 DIAGNOSIS — B9561 Methicillin susceptible Staphylococcus aureus infection as the cause of diseases classified elsewhere: Secondary | ICD-10-CM | POA: Diagnosis not present

## 2021-09-05 HISTORY — PX: IMPLANTABLE CARDIOVERTER DEFIBRILLATOR (ICD) GENERATOR CHANGE: SHX5469

## 2021-09-05 HISTORY — PX: ICD LEAD REMOVAL: SHX5855

## 2021-09-05 HISTORY — PX: TEE WITHOUT CARDIOVERSION: SHX5443

## 2021-09-05 LAB — CBC
HCT: 40.2 % (ref 39.0–52.0)
Hemoglobin: 13 g/dL (ref 13.0–17.0)
MCH: 31.3 pg (ref 26.0–34.0)
MCHC: 32.3 g/dL (ref 30.0–36.0)
MCV: 96.6 fL (ref 80.0–100.0)
Platelets: 106 10*3/uL — ABNORMAL LOW (ref 150–400)
RBC: 4.16 MIL/uL — ABNORMAL LOW (ref 4.22–5.81)
RDW: 14.5 % (ref 11.5–15.5)
WBC: 4.7 10*3/uL (ref 4.0–10.5)
nRBC: 0 % (ref 0.0–0.2)

## 2021-09-05 LAB — BASIC METABOLIC PANEL
Anion gap: 10 (ref 5–15)
BUN: 15 mg/dL (ref 6–20)
CO2: 33 mmol/L — ABNORMAL HIGH (ref 22–32)
Calcium: 8.4 mg/dL — ABNORMAL LOW (ref 8.9–10.3)
Chloride: 93 mmol/L — ABNORMAL LOW (ref 98–111)
Creatinine, Ser: 0.94 mg/dL (ref 0.61–1.24)
GFR, Estimated: >60 mL/min (ref >60)
Glucose, Bld: 109 mg/dL — ABNORMAL HIGH (ref 70–99)
Potassium: 3.6 mmol/L (ref 3.5–5.1)
Sodium: 136 mmol/L (ref 135–145)

## 2021-09-05 LAB — ECHO INTRAOPERATIVE TEE
Height: 66 in
Weight: 3809.55 oz

## 2021-09-05 LAB — GLUCOSE, CAPILLARY
Glucose-Capillary: 110 mg/dL — ABNORMAL HIGH (ref 70–99)
Glucose-Capillary: 99 mg/dL (ref 70–99)
Glucose-Capillary: 108 mg/dL — ABNORMAL HIGH (ref 70–99)
Glucose-Capillary: 92 mg/dL (ref 70–99)
Glucose-Capillary: 122 mg/dL — ABNORMAL HIGH (ref 70–99)

## 2021-09-05 LAB — PREPARE RBC (CROSSMATCH)

## 2021-09-05 SURGERY — ICD GENERATOR CHANGE
Anesthesia: General

## 2021-09-05 MED ORDER — SODIUM CHLORIDE 0.9 % IV SOLN: INTRAVENOUS | Status: DC | PRN

## 2021-09-05 MED ORDER — PHENYLEPHRINE HCL-NACL 20-0.9 MG/250ML-% IV SOLN
INTRAVENOUS | Status: DC | PRN
Start: 2021-09-05 — End: 2021-09-05
  Administered 2021-09-05: 40 ug/min via INTRAVENOUS

## 2021-09-05 MED ORDER — CHLORHEXIDINE GLUCONATE 0.12 % MT SOLN
OROMUCOSAL | Status: AC
  Administered 2021-09-05: 15 mL via OROMUCOSAL
  Filled 2021-09-05: qty 15

## 2021-09-05 MED ORDER — SUGAMMADEX SODIUM 200 MG/2ML IV SOLN
INTRAVENOUS | Status: DC | PRN
  Administered 2021-09-05: 400 mg via INTRAVENOUS

## 2021-09-05 MED ORDER — PROPOFOL 10 MG/ML IV BOLUS
INTRAVENOUS | Status: AC
  Filled 2021-09-05: qty 20

## 2021-09-05 MED ORDER — FENTANYL CITRATE (PF) 250 MCG/5ML IJ SOLN
INTRAMUSCULAR | Status: AC
  Filled 2021-09-05: qty 5

## 2021-09-05 MED ORDER — MIDAZOLAM HCL 2 MG/2ML IJ SOLN
INTRAMUSCULAR | Status: DC | PRN
  Administered 2021-09-05: 2 mg via INTRAVENOUS

## 2021-09-05 MED ORDER — OXYCODONE HCL 5 MG/5ML PO SOLN: 5.0000 mg | Freq: Once | ORAL | Status: DC | PRN

## 2021-09-05 MED ORDER — FENTANYL CITRATE (PF) 100 MCG/2ML IJ SOLN: 25.0000 ug | INTRAMUSCULAR | Status: DC | PRN

## 2021-09-05 MED ORDER — PROPOFOL 10 MG/ML IV BOLUS
INTRAVENOUS | Status: DC | PRN
  Administered 2021-09-05: 200 mg via INTRAVENOUS

## 2021-09-05 MED ORDER — CHLORHEXIDINE GLUCONATE 0.12 % MT SOLN: 15.0000 mL | Freq: Once | OROMUCOSAL | Status: AC

## 2021-09-05 MED ORDER — INSULIN ASPART 100 UNIT/ML IJ SOLN: 0.0000 [IU] | INTRAMUSCULAR | Status: DC | PRN

## 2021-09-05 MED ORDER — OXYCODONE HCL 5 MG PO TABS: 5.0000 mg | ORAL_TABLET | Freq: Once | ORAL | Status: DC | PRN

## 2021-09-05 MED ORDER — MIDAZOLAM HCL 2 MG/2ML IJ SOLN
INTRAMUSCULAR | Status: AC
  Filled 2021-09-05: qty 2

## 2021-09-05 MED ORDER — POTASSIUM CHLORIDE CRYS ER 20 MEQ PO TBCR
20.0000 meq | EXTENDED_RELEASE_TABLET | Freq: Every day | ORAL | Status: DC
  Administered 2021-09-05 – 2021-09-10 (×6): 20 meq via ORAL
  Filled 2021-09-05 (×6): qty 1

## 2021-09-05 MED ORDER — ROCURONIUM BROMIDE 10 MG/ML (PF) SYRINGE
PREFILLED_SYRINGE | INTRAVENOUS | Status: DC | PRN
  Administered 2021-09-05: 70 mg via INTRAVENOUS
  Administered 2021-09-05: 30 mg via INTRAVENOUS

## 2021-09-05 MED ORDER — ONDANSETRON HCL 4 MG/2ML IJ SOLN: 4.0000 mg | Freq: Four times a day (QID) | INTRAMUSCULAR | Status: DC | PRN

## 2021-09-05 MED ORDER — DEXAMETHASONE SODIUM PHOSPHATE 10 MG/ML IJ SOLN
INTRAMUSCULAR | Status: DC | PRN
Start: 2021-09-05 — End: 2021-09-05
  Administered 2021-09-05: 10 mg via INTRAVENOUS

## 2021-09-05 MED ORDER — LACTATED RINGERS IV SOLN: INTRAVENOUS | Status: DC

## 2021-09-05 MED ORDER — ORAL CARE MOUTH RINSE: 15.0000 mL | Freq: Once | OROMUCOSAL | Status: AC

## 2021-09-05 MED ORDER — ONDANSETRON HCL 4 MG/2ML IJ SOLN
INTRAMUSCULAR | Status: DC | PRN
Start: 2021-09-05 — End: 2021-09-05
  Administered 2021-09-05: 4 mg via INTRAVENOUS

## 2021-09-05 MED ORDER — LIDOCAINE 2% (20 MG/ML) 5 ML SYRINGE
INTRAMUSCULAR | Status: DC | PRN
  Administered 2021-09-05: 30 mg via INTRAVENOUS

## 2021-09-05 MED ORDER — FENTANYL CITRATE (PF) 250 MCG/5ML IJ SOLN
INTRAMUSCULAR | Status: DC | PRN
  Administered 2021-09-05: 100 ug via INTRAVENOUS

## 2021-09-05 MED ORDER — SODIUM CHLORIDE 0.9 % IV SOLN
INTRAVENOUS | Status: AC
  Filled 2021-09-05: qty 2

## 2021-09-05 SURGICAL SUPPLY — 37 items
BAG COUNTER SPONGE SURGICOUNT (BAG) ×3 IMPLANT
BAG SPNG CNTER NS LX DISP (BAG) ×2
BALLN OCL BRIDGE 80X90X.9 60 (BALLOONS) ×3
BALLOON OCL BRIDGE 80X90X.9 60 (BALLOONS) IMPLANT
BLADE CORE FAN STRYKER (BLADE) ×1 IMPLANT
BLADE STERNUM SYSTEM 6 (BLADE) ×1 IMPLANT
BLADE SURG 11 STRL SS (BLADE) ×1 IMPLANT
BRIDGE PREP KIT (KITS) ×3
CANISTER SUCT 3000ML PPV (MISCELLANEOUS) ×3 IMPLANT
DEVICE LCKNG LEAD CARDIAC (CATHETERS) IMPLANT
DRAPE CARDIOVASCULAR INCISE (DRAPES) ×3
DRAPE HALF SHEET 40X57 (DRAPES) ×5 IMPLANT
DRAPE INCISE IOBAN 66X45 STRL (DRAPES) ×3 IMPLANT
DRAPE SRG 135X102X78XABS (DRAPES) ×2 IMPLANT
DRSG TEGADERM 4X4.5 CHG (GAUZE/BANDAGES/DRESSINGS) ×1 IMPLANT
ELECT REM PT RETURN 9FT ADLT (ELECTROSURGICAL)
ELECTRODE REM PT RTRN 9FT ADLT (ELECTROSURGICAL) ×2 IMPLANT
GAUZE 4X4 16PLY ~~LOC~~+RFID DBL (SPONGE) ×4 IMPLANT
GAUZE SPONGE 4X4 12PLY STRL (GAUZE/BANDAGES/DRESSINGS) ×4 IMPLANT
GLOVE BIOGEL PI IND STRL 7.5 (GLOVE) ×2 IMPLANT
GLOVE BIOGEL PI INDICATOR 7.5 (GLOVE)
GLOVE ECLIPSE 8.0 STRL XLNG CF (GLOVE) ×2 IMPLANT
GOWN STRL REUS W/ TWL LRG LVL3 (GOWN DISPOSABLE) ×4 IMPLANT
GOWN STRL REUS W/ TWL XL LVL3 (GOWN DISPOSABLE) ×2 IMPLANT
GOWN STRL REUS W/TWL LRG LVL3 (GOWN DISPOSABLE) ×6
GOWN STRL REUS W/TWL XL LVL3 (GOWN DISPOSABLE) ×3
KIT BRIDGE PREP (KITS) IMPLANT
KIT TURNOVER KIT B (KITS) ×3 IMPLANT
PAD ARMBOARD 7.5X6 YLW CONV (MISCELLANEOUS) ×6 IMPLANT
PENCIL BUTTON HOLSTER BLD 10FT (ELECTRODE) IMPLANT
REMOVAL LLD CARDIAC LEAD EZ (CATHETERS) ×3
SHEATH LASER 14 FR GLIDELIGHT (SHEATH) ×1 IMPLANT
SPONGE T-LAP 18X18 ~~LOC~~+RFID (SPONGE) ×1 IMPLANT
SUT ETHIBOND 0 36 GRN (SUTURE) ×2 IMPLANT
SUT PROLENE 2 0 MH 48 (SUTURE) ×1 IMPLANT
TAPE CLOTH SURG 4X10 WHT LF (GAUZE/BANDAGES/DRESSINGS) ×1 IMPLANT
TOWEL GREEN STERILE FF (TOWEL DISPOSABLE) ×5 IMPLANT

## 2021-09-05 NOTE — Progress Notes (Signed)
CT Surgery  Cardiac surgical backup provided for lead extraction- from 3:30 to 4:30 pm  I was present in OR room 16 control room.  Ivin Poot MD

## 2021-09-05 NOTE — Anesthesia Procedure Notes (Signed)
Procedure Name: Intubation Date/Time: 09/05/2021 3:37 PM Performed by: Eligha Bridegroom, CRNA Pre-anesthesia Checklist: Patient identified, Emergency Drugs available, Suction available, Patient being monitored and Timeout performed Patient Re-evaluated:Patient Re-evaluated prior to induction Oxygen Delivery Method: Circle system utilized Preoxygenation: Pre-oxygenation with 100% oxygen Induction Type: IV induction Ventilation: Mask ventilation without difficulty and Oral airway inserted - appropriate to patient size Laryngoscope Size: Glidescope Grade View: Grade III Tube type: Oral Tube size: 8.0 mm Number of attempts: 1 Airway Equipment and Method: Stylet and Video-laryngoscopy Placement Confirmation: ETT inserted through vocal cords under direct vision, positive ETCO2 and breath sounds checked- equal and bilateral Secured at: 23 cm Tube secured with: Tape Dental Injury: Teeth and Oropharynx as per pre-operative assessment  Difficulty Due To: Difficult Airway- due to reduced neck mobility and Difficult Airway- due to dentition

## 2021-09-05 NOTE — Anesthesia Preprocedure Evaluation (Signed)
Anesthesia Evaluation  Patient identified by MRN, date of birth, ID band Patient awake    Reviewed: Allergy & Precautions, H&P , NPO status , Patient's Chart, lab work & pertinent test results  Airway Mallampati: II   Neck ROM: full    Dental   Pulmonary sleep apnea , former smoker,    breath sounds clear to auscultation       Cardiovascular hypertension, +CHF  + Cardiac Defibrillator  Rhythm:regular Rate:Normal  H/o VSD repair at age 46. Recent studies reveal no residual VSD, EF 45%, mild pulmonic stenosis.  Vegetation on ICD lead.   Neuro/Psych    GI/Hepatic   Endo/Other  diabetes, Type 2  Renal/GU      Musculoskeletal   Abdominal   Peds  Hematology   Anesthesia Other Findings   Reproductive/Obstetrics                             Anesthesia Physical Anesthesia Plan  ASA: 3  Anesthesia Plan: General   Post-op Pain Management:    Induction: Intravenous  PONV Risk Score and Plan: 2 and Ondansetron, Dexamethasone, Midazolam and Treatment may vary due to age or medical condition  Airway Management Planned: Oral ETT  Additional Equipment: TEE and Arterial line  Intra-op Plan:   Post-operative Plan: Extubation in OR  Informed Consent: I have reviewed the patients History and Physical, chart, labs and discussed the procedure including the risks, benefits and alternatives for the proposed anesthesia with the patient or authorized representative who has indicated his/her understanding and acceptance.     Dental advisory given  Plan Discussed with: CRNA, Anesthesiologist and Surgeon  Anesthesia Plan Comments:         Anesthesia Quick Evaluation

## 2021-09-05 NOTE — Op Note (Signed)
SURGEON:  Lars Mage, MD  PREPROCEDURE DIAGNOSES: 1. Chronic systolic heart failure 2. Nonischemic cardiomyopathy 3. Single chamber ICD in situ 4. Septic shock secondary to MSSA bacteremia  POSTPROCEDURE DIAGNOSES: Same as preprocedure  PROCEDURES: 1. ICD lead extraction 2. Generator removal 3. Partial capsulectomy  INTRODUCTION:  Kevin Cardenas is a 46 y.o. male with a history of chronic systolic heart failure with an ICD in place presents to the OR for ICD system extraction after he was admitted with septic shock due to MSSA bacteremia.  DESCRIPTION OF PROCEDURE:  Informed written consent was obtained, and the patient was brought to the OR in a fasting state. General anaesthesia was induced. A preprocedural transesophageal echocardiogram was performed by the anaesthesiologist which showed a trivial pericardial effusion. A cardiothoracic surgeon was immediately available throughout today's procedure.  Using ultrasound guidance and modified Seldinger technique, a 12-French introducer sheath was placed in the right common femoral vein. I passed a guide-wire to the right IJ and staged a Bridge balloon outside the body on the wire.  Next, I opened the prepectoral pocket over the existing device and delivered the generator from the pocket. There was mild scar tissue accumulation in the pocket. The pocket tissue appeared healthy without evidence of pocket infection. I retracted the patient's ICD lead helix. Next, I prepped the patient's ICD lead in the standard fashion using a locking stylet.  A 14-French laser was prepped and used to successfully extract the ICD lead. There were dense adhesions in the subclavian vein adjacent to the clavicle. There was a moderate adhesions along the subclavian vein, within the RA and along the ICD coil requiring applications of laser energy.  A partial capsulectomy was performed. The pocket was irrigated with antibiotic solution before closing the  incision with interrupted prolene sutures.   The wire and sheath were removed from the right common femoral vein and hemostasis achieved with manual pressure.  The patient tolerated the procedure well without immediate complication.   EBL<81m. There were no early apparent complications.  CONCLUSIONS: 1. Chronic systolic heart failure 2. Septic shock due to MSSA bacteremia 3. CIED associated infection 4. Successful transvenous lead extraction of the RV ICD lead and generator removal 5. No early apparent complications 6. Wearable defibrillator at discharge 7. Remove sutures in 10 days   CLysbeth GalasT. LQuentin Ore MD, FGreenville Surgery Center LLC FCbcc Pain Medicine And Surgery CenterCardiac Electrophysiology

## 2021-09-05 NOTE — Transfer of Care (Signed)
Immediate Anesthesia Transfer of Care Note  Patient: Kevin Cardenas  Procedure(s) Performed: DEFIBRILLATOR GENERATOR REMOVAL TRANSESOPHAGEAL ECHOCARDIOGRAM (TEE) ICD LEAD REMOVAL  Patient Location: PACU  Anesthesia Type:General  Level of Consciousness: awake, alert  and oriented  Airway & Oxygen Therapy: Patient Spontanous Breathing and Patient connected to nasal cannula oxygen  Post-op Assessment: Report given to RN and Post -op Vital signs reviewed and stable  Post vital signs: Reviewed and stable  Last Vitals:  Vitals Value Taken Time  BP 131/71 09/05/21 1711  Temp    Pulse 93 09/05/21 1714  Resp 18 09/05/21 1714  SpO2 93 % 09/05/21 1714  Vitals shown include unvalidated device data.  Last Pain:  Vitals:   09/05/21 1429  TempSrc: Oral  PainSc:          Complications: No notable events documented.

## 2021-09-05 NOTE — Progress Notes (Signed)
Progress Note  Kevin Cardenas Name: Kevin Cardenas Date of Encounter: 09/05/2021  [facility] Cardiologist: [name], MD   Subjective   Doing well, no complaints  Inpatient Medications    Scheduled Meds:  arformoterol  15 mcg Nebulization BID   budesonide (PULMICORT) nebulizer solution  0.25 mg Nebulization BID   carvedilol  3.125 mg Oral BID WC   Chlorhexidine Gluconate Cloth  6 each Topical Daily   dapagliflozin propanediol  10 mg Oral Daily   digoxin  0.125 mg Oral Daily   gentamicin irrigation  80 mg Irrigation On Call   insulin aspart  0-9 Units Subcutaneous TID WC   mouth rinse  15 mL Mouth Rinse BID   sacubitril-valsartan  1 tablet Oral BID   simvastatin  20 mg Oral QHS   spironolactone  25 mg Oral Daily   torsemide  20 mg Oral QPM   torsemide  40 mg Oral q morning   Continuous Infusions:  sodium chloride Stopped (09/04/21 0648)    ceFAZolin (ANCEF) IV 2 g (09/05/21 0500)    ceFAZolin (ANCEF) IV     PRN Meds: acetaminophen, docusate sodium, ipratropium-albuterol, ondansetron (ZOFRAN) IV, polyethylene glycol   Vital Signs    Vitals:   09/04/21 2301 09/04/21 2312 09/05/21 0426 09/05/21 0723  BP:  100/73 107/73 106/75  Pulse: 93 89 85 92  Resp: _0 Temp:  98.2 F (36.8 C) 98.1 F (36.7 C) 98.5 F (36.9 C)  TempSrc:  Oral Oral Oral  SpO2: 93% 97% 92% 93%  Weight:      Height:       No intake or output data in the 24 hours ending 09/05/21 0825    09/04/2021    7:05 AM 09/04/2021    5:00 AM 09/03/2021    5:00 AM  Last 3 Weights  Weight (lbs) 238 lb 1.6 oz 238 lb 1.6 oz 239 lb 13.8 oz  Weight (kg) 108 kg 108 kg 108.8 kg      Telemetry    SR/ST 70's-100's - Personally Reviewed  ECG    No new EKGs - Personally Reviewed  Physical Exam   Examined by Dr. Quentin Ore GEN: No acute distress.   Neck: No JVD Cardiac: RRR, no murmurs, rubs, or gallops.  Respiratory: Clear to auscultation bilaterally. GI: Soft, nontender, non-distended  MS:  No edema; No deformity. Neuro:  Nonfocal  Psych: Normal affect   Labs    High Sensitivity Troponin:   Recent Labs  Lab 09/01/21 1423 09/01/21 1713  TROPONINIHS 6 8     Chemistry Recent Labs  Lab 09/01/21 1423 09/02/21 0418 09/03/21 0209 09/04/21 0258 09/05/21 0208  NA 135 137 134* 135 136  K 4.3 4.2 3.6 3.9 3.6  CL 98 102 99 97* 93*  CO2 _1 33*  GLUCOSE 113* 125* 149* 115* 109*  BUN 22* _2 CREATININE 1.51* 1.30* 0.93 0.95 0.94  CALCIUM 8.7* 8.3* 8.4* 8.6* 8.4*  MG  --  2.1 2.2 2.2  --   PROT 7.4  --   --   --   --   ALBUMIN 3.8  --   --   --   --   AST 23  --   --   --   --   ALT 20  --   --   --   --   ALKPHOS 57  --   --   --   --  BILITOT 1.1  --   --   --   --   GFRNONAA 57* >60 >60 >60 >60  ANIONGAP _0 Lipids No results for input(s): CHOL, TRIG, HDL, LABVLDL, LDLCALC, CHOLHDL in the last 168 hours.  Hematology Recent Labs  Lab 09/03/21 0209 09/04/21 0258 09/05/21 0208  WBC 4.1 4.5 4.7  RBC 4.12* 4.19* 4.16*  HGB 13.0 13.1 13.0  HCT 40.8 40.8 40.2  MCV 99.0 97.4 96.6  MCH 31.6 31.3 31.3  MCHC 31.9 32.1 32.3  RDW 14.6 14.4 14.5  PLT 89* 97* 106*   Thyroid No results for input(s): TSH, FREET4 in the last 168 hours.  BNP Recent Labs  Lab 09/01/21 1713 09/02/21 0526  BNP 130.8* 231.9*    DDimer  Recent Labs  Lab 09/01/21 1713  DDIMER 2.52*     Radiology     Cardiac Studies   09/04/21:: TEE 1. No residual VSD noted. Left ventricular ejection fraction, by  estimation, is 35 to 40%. The left ventricle has moderately decreased  function. The left ventricle demonstrates global hypokinesis. There is  mild left ventricular hypertrophy.   2. There is a 0.5 x 0.9 cm vegetation attached to the ICD lead as it  passes through the right atrium. Right ventricular systolic function is  moderately reduced. The right ventricular size is mildly enlarged.   3. Left atrial size was mildly dilated. No left atrial/left atrial   appendage thrombus was detected.   4. Right atrial size was mildly dilated.   5. No MV vegetation. The mitral valve is normal in structure. No evidence  of mitral valve regurgitation. No evidence of mitral stenosis.   6. Peak RV-RA gradient 17 mmHg. No TV vegetation.   7. No aortic valve vegetation. The aortic valve is tricuspid. Aortic  valve regurgitation is not visualized. No aortic stenosis is present.   8. Peak pulmonic valve gradient 13 mmHg. No PV vegetation. Mild pulmonic  stenosis.   9. PFO by color doppler.    09/02/21: TTE  1. Left ventricular ejection fraction, by estimation, is 35%. The left  ventricle has moderately decreased function. The left ventricle  demonstrates global hypokinesis. Left ventricular diastolic parameters are  indeterminate. No residual VSD visualized  by color doppler.   2. Right ventricular systolic function is normal. The right ventricular  size is moderately enlarged. There is mildly elevated pulmonary artery  systolic pressure. The estimated right ventricular systolic pressure is  52.5 mmHg.   3. The mitral valve is normal in structure. Trivial mitral valve  regurgitation.   4. The aortic valve is tricuspid. Aortic valve regurgitation is not  visualized.   5. The inferior vena cava is dilated in size with >50% respiratory  variability, suggesting right atrial pressure of 8 mmHg.   6. No significant pulmonic stenosis. Peak gradient 50mHg.   Comparison(s): Compared to prior TTE in 06/2021, there is no significant  change.      06/24/2021: R/LHC 1. Normal coronaries.  2. Normal filling pressures.  3. Normal cardiac output.  4. Mild pulmonary hypertension, suspect related to OHS/OSA.   Kevin Cardenas Profile     Kevin Cardenas  with a hx of  VSD repair and removal of a pulmonary artery band at age 46 DM, HTN, chronic CHF (systolic), NICM, OSA (not on CPAP), there is mention of restrictive lung disease as well, admitted with fever/sepsis, found with  MSSA bacteremia  Assessment & Plan    Sepsis  Off pressors, VSS Back on his HF meds  MSSA bacteremia ?source leg wound, poor dentition  CIED associated infection w/vegetation on device lead [place]ocket looks ok Planned for ICD system extraction this afternoon NO ASA, NO LOVENOX   LVEF 35-40% May not need to revisit ICD (would be S-ICD) perhaps if EF remains improved No hx of device therapies    For questions or updates, please contact [place] Please consult [email or web address] for contact info under        Signed, Baldwin [place], PA-C  09/05/2021, 8:25 AM

## 2021-09-05 NOTE — Progress Notes (Addendum)
Patient ID: Kevin Cardenas, male   DOB: June 23, 1975, 46 y.o.   MRN: [id number]     Advanced Heart Failure Rounding Note  PCP-Cardiologist: [name], MD   Subjective:    Admitted w/ MSSA bacteremia. AF w/ abx. ID following.   Echo: EF 35%, moderate RV enlargement, normal RV function, no significant pulmonary valve stenosis noted, no obvious vegetation.   TEE: LV EF 35-40%, mild RV dilation with moderate RV dysfunction, mild PS (mean gradient 13 mmHg), no VSD, there is a small 0.5 x 0.9 cm vegetation on the ICD lead as it traverses the RA, no valvular vegetation noted.   Scheduled for device extraction later today.   No complaints. Denies dyspnea. No CP, f/c.   Objective:   Weight Range: 108 kg Body mass index is 38.43 kg/m.   Vital Signs:   Temp:  [98.1 F (36.7 C)-98.9 F (37.2 C)] 98.5 F (36.9 C) (05/25 0723) Pulse Rate:  [85-103] 89 (05/25 0900) Resp:  [16-19] 18 (05/25 0723) BP: (100-108)/(67-75) 106/75 (05/25 0723) SpO2:  [87 %-100 %] 93 % (05/25 0723) Last BM Date : 09/04/21  Weight change: Filed Weights   09/03/21 0500 09/04/21 0500 09/04/21 0705  Weight: 108.8 kg 108 kg 108 kg    Intake/Output:  No intake or output data in the 24 hours ending 09/05/21 0904     Physical Exam    General:  Well appearing obese. No respiratory difficulty HEENT: normal Neck: supple. no JVD. Carotids 2+ bilat; no bruits. No lymphadenopathy or thyromegaly appreciated. Cor: PMI nondisplaced. Regular rate & rhythm. + SEM LUSB  Lungs: clear Abdomen: soft, nontender, nondistended. No hepatosplenomegaly. No bruits or masses. Good bowel sounds. Extremities: no cyanosis, clubbing, rash, edema Neuro: alert & oriented x 3, cranial nerves grossly intact. moves all 4 extremities w/o difficulty. Affect pleasant.   Telemetry   NSR 70s (personally reviewed)  Labs    CBC Recent Labs    09/04/21 0258 09/05/21 0208  WBC 4.5 4.7  HGB 13.1 13.0  HCT 40.8 40.2  MCV 97.4 96.6   PLT 97* 601*   Basic Metabolic Panel Recent Labs    09/03/21 0209 09/04/21 0258 09/05/21 0208  NA 134* 135 136  K 3.6 3.9 3.6  CL 99 97* 93*  CO2 27 29 33*  GLUCOSE 149* 115* 109*  BUN _0 CREATININE 0.93 0.95 0.94  CALCIUM 8.4* 8.6* 8.4*  MG 2.2 2.2  --    Liver Function Tests No results for input(s): AST, ALT, ALKPHOS, BILITOT, PROT, ALBUMIN in the last 72 hours.  No results for input(s): LIPASE, AMYLASE in the last 72 hours. Cardiac Enzymes No results for input(s): CKTOTAL, CKMB, CKMBINDEX, TROPONINI in the last 72 hours.  BNP: BNP (last 3 results) Recent Labs    06/19/21 0957 09/01/21 1713 09/02/21 0526  BNP 235.5* 130.8* 231.9*    ProBNP (last 3 results) No results for input(s): PROBNP in the last 8760 hours.   D-Dimer No results for input(s): DDIMER in the last 72 hours.  Hemoglobin A1C No results for input(s): HGBA1C in the last 72 hours. Fasting Lipid Panel No results for input(s): CHOL, HDL, LDLCALC, TRIG, CHOLHDL, LDLDIRECT in the last 72 hours. Thyroid Function Tests No results for input(s): TSH, T4TOTAL, T3FREE, THYROIDAB in the last 72 hours.  Invalid input(s): FREET3  Other results:   Imaging    No results found.   Medications:     Scheduled Medications:  arformoterol  15 mcg Nebulization BID  budesonide (PULMICORT) nebulizer solution  0.25 mg Nebulization BID   carvedilol  3.125 mg Oral BID WC   Chlorhexidine Gluconate Cloth  6 each Topical Daily   dapagliflozin propanediol  10 mg Oral Daily   digoxin  0.125 mg Oral Daily   gentamicin irrigation  80 mg Irrigation On Call   insulin aspart  0-9 Units Subcutaneous TID WC   mouth rinse  15 mL Mouth Rinse BID   sacubitril-valsartan  1 tablet Oral BID   simvastatin  20 mg Oral QHS   spironolactone  25 mg Oral Daily   torsemide  20 mg Oral QPM   torsemide  40 mg Oral q morning    Infusions:  sodium chloride Stopped (09/04/21 0648)    ceFAZolin (ANCEF) IV 2 g (09/05/21  0500)    ceFAZolin (ANCEF) IV      PRN Medications: acetaminophen, docusate sodium, ipratropium-albuterol, ondansetron (ZOFRAN) IV, polyethylene glycol    Assessment/Plan   1. Septic shock with MSSA bacteremia: ?Source from RLE cellulitis.  PCT 25 initially with high fever and hypotension requiring norepinephrine, now off pressor.  Blood cultures 5/21 with MSSA.  Has [employer] ICD => TEE 5/24 showed 0.5 x 0.9 cm vegetation on the ICD lead as it traverses the RA. No valvular vegetation noted.  - Continue cefazolin, ID following. - Plan for ICD system removal today per EP.   - Per ID, will need cefazolin x 6 wks  2. Acute on chronic systolic CHF: Nonischemic cardiomyopathy.  RHC in 4/16 showed preserved cardiac index by thermodilution but low by Fick.   Last echo in 8/21 showed EF 30-35%, normal RV, no residual VSD, no definite pulmonic stenosis.  Echo in 3/23 showed EF 30-35%, mild LVH, no VSD, no significant pulmonary stenosis, moderate RVE with normal RV systolic function. LHC/RHC in 3/23 with no coronary disease, CI 3.28.  He has a [employer] ICD.  Echo this admission looked similar to 3/23, no definite vegetation noted.  TEE showed LV EF 35-40%, mild RV dilation with moderate RV dysfunction, mild PS (mean gradient 13 mmHg), no VSD, there is a small 0.5 x 0.9 cm vegetation on the ICD lead as it traverses the RA, no valvular vegetation noted.  He diuresed well with IV Lasix, volume status looks ok today.  - Continue home torsemide 40 qam/20 qpm.  - Continue Coreg 3.125 mg bid.  - Continue Entresto 24/26 bid (was on 49/51 bid at home, can increase to home dose prior to discharge). - Continue spironolactone 25 mg daily.  - Continue digoxin 0.125 daily.   - Continue Farxiga 10 mg daily.   - Plan to remove ICD today with endocarditis.  LV EF looks 35-40% to me, may not need to replace.   3. H/o VSD: No residual shunt noted 3/23 echo.  Last RHC also did not suggest any significant left to right  shunting. TEE this admit showed no definite VSD.  4. Pulmonary stenosis: Mild on TEE, peak gradient 13 mmHg.  5. OSA: CPAP at night.   Length of Stay: [address] PA-C  09/05/2021, 9:04 AM  Advanced Heart Failure Team Pager [phone] (M-F; 7a - 5p)  Please contact [facility] for night-coverage after hours (5p -7a ) and weekends on [email or web address]   Agree with the above PA note.   Patient going for device extraction for MSSA endocarditis (ICD lead vegetation) today. Send blood cultures after extraction, PICC can be placed with 48 hrs of no growth.  No changes to cardiac regimen.   [name] 09/05/2021

## 2021-09-05 NOTE — Progress Notes (Signed)
Brief ID Note:   Cardiac device removal scheduled for later today d/t lead associated MSSA endocarditis.  Will order repeat blood cultures this evening for completeness prior to PICC Line placement. Can place if no growth 48 hours in preparation for discharge.    Janene Madeira, MSN, NP-C [facility] Infectious Disease [email or web address] Pager: [phone] Office: [phone] [facility] Main Line: [place] Communication Welcome

## 2021-09-05 NOTE — Progress Notes (Signed)
LEVEL 0. NO HEMATOMA.

## 2021-09-05 NOTE — Progress Notes (Signed)
PROGRESS NOTE   Kevin Cardenas  HXT:[id number] DOB: 17-Oct-1975 DOA: 09/01/2021 PCP: Dorna Mai, MD  Brief Narrative:  46 year old community dwelling black male VSD repair + pulmonary artery banding at age 28--NICM + [place] ICD 01/2015 with resulting HFrEF NYHA class II symptoms at follow-up 4/23 (last echo 3/20 = EF 25%)-follows chronically with AHF team-last OV apparently 20 pounds >usual OSA no CPAP-+ restrictive lung disease Recent varicose vein repair R LE  5/21: present [place] ed chills dyspnea febrile 103 lactic acidosis procalcitonin 24 systolic BP 70 Admitted by CCM-started on pressors-septic shock secondary to staph cellulitis RLE 5/22: ID consulted/AHF team consulted 5/22: Off pressors 5/24:Transfer to [facility] --- TEE showed infected pacemaker lead 5/25: Blood culture to be repeated this p.m.    Hospital-Problem based course  Sepsis 2/2 MSSA from PPM lead  Thrombocytopenia extraction per EP Abx [Ancef currently] as per ID--obtaining repeat blood culture 5/25 PM-PICC line can be placed 5/27 if cultures are cleared Has dental caries on tooth #2--will need definitive extraction--Dr. Benson Norway out of town until 5/29 per dental voicemail--no input from dental medicine as yet patient may have to follow-up in the outpatient setting with dentistry VSD/Pulm banding HFrEF NYHA class II-III symptoms EF 25% NICM  [secondarily placed PPM due to NUICM?] Defer to cardiology: digoxin 0.125, Entresto-Aldactone 25 torsemide as per cardiology Coreg 3.125 twice daily Perioperative fluids as per cardiology  weight seems stable in the 106 to 108 kg range OSA not on CPAP with restrictive lung disease Continue [place] Pulmicort DuoNeb-as an outpatient probably needs to titrate to a different device  DVT prophylaxis: SCD Code Status: Full Family Communication: Discussed with wife at bedside Disposition:  Status is: Inpatient Remains inpatient appropriate because:    Consultants:     Procedures:   Antimicrobials:     Subjective:  N.p.o. for procedure No new complaints  Objective: Vitals:   09/05/21 0723 09/05/21 0900 09/05/21 1215 09/05/21 1429  BP: 106/75  100/69 111/69  Pulse: 92 89 89 76  Resp: _0 Temp: 98.5 F (36.9 C)  98.4 F (36.9 C) 98.3 F (36.8 C)  TempSrc: Oral  Oral Oral  SpO2: 93%  95% 94%  Weight:    106.6 kg  Height:    _1  (1.676 m)    Intake/Output Summary (Last 24 hours) at 09/05/2021 1546 Last data filed at 09/05/2021 0900 Gross per 24 hour  Intake 0 ml  Output --  Net 0 ml    Filed Weights   09/04/21 0500 09/04/21 0705 09/05/21 1429  Weight: 108 kg 108 kg 106.6 kg    Examination:  Awake coherent no distress Chest clear no rales rhonchi Holosystolic murmur across precordium Abdomen is obese nontender no rebound no guarding ROM is intact Neurologically intact moving 4 limbs equally no focal deficit noted grossly Psych euthymic pleasant coherent  Data Reviewed: personally reviewed   CBC    Component Value Date/Time   WBC 4.7 09/05/2021 0208   RBC 4.16 (L) 09/05/2021 0208   HGB 13.0 09/05/2021 0208   HGB 14.4 07/24/2021 0939   HCT 40.2 09/05/2021 0208   HCT 41.9 07/24/2021 0939   PLT 106 (L) 09/05/2021 0208   PLT 182 07/24/2021 0939   MCV 96.6 09/05/2021 0208   MCV 93 07/24/2021 0939   MCH 31.3 09/05/2021 0208   MCHC 32.3 09/05/2021 0208   RDW 14.5 09/05/2021 0208   RDW 12.6 07/24/2021 0939   LYMPHSABS 0.3 (L) 09/02/2021 0418   LYMPHSABS  1.0 04/17/2021 1047   MONOABS 0.4 09/02/2021 0418   EOSABS 0.0 09/02/2021 0418   EOSABS 0.1 04/17/2021 1047   BASOSABS 0.0 09/02/2021 0418   BASOSABS 0.0 04/17/2021 1047      Latest Ref Rng & Units 09/05/2021    2:08 AM 09/04/2021    2:58 AM 09/03/2021    2:09 AM  CMP  Glucose 70 - 99 mg/dL 109   115   149    BUN 6 - 20 mg/dL _0 Creatinine 0.61 - 1.24 mg/dL 0.94   0.95   0.93    Sodium 135 - 145 mmol/L 136   135   134    Potassium 3.5 -  5.1 mmol/L 3.6   3.9   3.6    Chloride 98 - 111 mmol/L 93   97   99    CO2 22 - 32 mmol/L 33   29   27    Calcium 8.9 - 10.3 mg/dL 8.4   8.6   8.4       Radiology Studies: ECHO TEE  Result Date: 09/04/2021    TRANSESOPHOGEAL ECHO REPORT   Patient Name:   Kevin Cardenas Date of Exam: 09/04/2021 Medical Rec #:  [id number]       Height:       66.0 in Accession #:    [id number]      Weight:       238.1 lb Date of Birth:  1976-02-21       BSA:          2.154 m Patient Age:    57 years        BP:           128/77 mmHg Patient Gender: M               HR:           96 bpm. Exam Location:  Inpatient Procedure: 3D Echo, Transesophageal Echo, Cardiac Doppler and Color Doppler Indications:     Bacteremia  History:         Patient has prior history of Echocardiogram examinations, most                  recent 09/02/2021. Cardiomyopathy and CHF, Pacemaker and                  Abnormal ECG, Signs/Symptoms:Hypotension; Risk Factors:Diabetes                  and Sleep Apnea. VSD.  Sonographer:     Roseanna Rainbow RDCS Referring Phys:  [place] Diagnosing Phys: Franki Monte PROCEDURE: After discussion of the risks and benefits of a TEE, an informed consent was obtained from the patient. The transesophogeal probe was passed without difficulty through the esophogus of the patient. Imaged were obtained with the patient in a left lateral decubitus position. Sedation performed by different physician. The patient was monitored while under deep sedation. Anesthestetic sedation was provided intravenously by Anesthesiology: 241m of Propofol, 1053mof Lidocaine. The patient developed Respiratory depression during the procedure. Suboptimal aorta and transgastric images due to respiratory depression. IMPRESSIONS  1. No residual VSD noted. Left ventricular ejection fraction, by estimation, is 35 to 40%. The left ventricle has moderately decreased function. The left ventricle demonstrates global hypokinesis. There is mild left  ventricular hypertrophy.  2. There is a 0.5 x 0.9 cm vegetation attached to the ICD lead as  it passes through the right atrium. Right ventricular systolic function is moderately reduced. The right ventricular size is mildly enlarged.  3. Left atrial size was mildly dilated. No left atrial/left atrial appendage thrombus was detected.  4. Right atrial size was mildly dilated.  5. No MV vegetation. The mitral valve is normal in structure. No evidence of mitral valve regurgitation. No evidence of mitral stenosis.  6. Peak RV-RA gradient 17 mmHg. No TV vegetation.  7. No aortic valve vegetation. The aortic valve is tricuspid. Aortic valve regurgitation is not visualized. No aortic stenosis is present.  8. Peak pulmonic valve gradient 13 mmHg. No PV vegetation. Mild pulmonic stenosis.  9. PFO by color doppler. FINDINGS  Left Ventricle: No residual VSD noted. Left ventricular ejection fraction, by estimation, is 35 to 40%. The left ventricle has moderately decreased function. The left ventricle demonstrates global hypokinesis. The left ventricular internal cavity size was normal in size. There is mild left ventricular hypertrophy. Right Ventricle: There is a 0.5 x 0.9 cm vegetation attached to the ICD lead as it passes through the right atrium. The right ventricular size is mildly enlarged. No increase in right ventricular wall thickness. Right ventricular systolic function is moderately reduced. Left Atrium: Left atrial size was mildly dilated. No left atrial/left atrial appendage thrombus was detected. Right Atrium: Right atrial size was mildly dilated. Pericardium: There is no evidence of pericardial effusion. Mitral Valve: No MV vegetation. The mitral valve is normal in structure. No evidence of mitral valve regurgitation. No evidence of mitral valve stenosis. Tricuspid Valve: Peak RV-RA gradient 17 mmHg. No TV vegetation. The tricuspid valve is normal in structure. Tricuspid valve regurgitation is trivial. Aortic  Valve: No aortic valve vegetation. The aortic valve is tricuspid. Aortic valve regurgitation is not visualized. No aortic stenosis is present. Pulmonic Valve: Peak pulmonic valve gradient 13 mmHg. No PV vegetation. The pulmonic valve was thickened with good excursion. Pulmonic valve regurgitation is not visualized. Mild pulmonic stenosis. Aorta: The aortic root is normal in size and structure. IAS/Shunts: PFO by color doppler. Additional Comments: A device lead is visualized in the right ventricle. [employer] Electronically signed by Franki Monte Signature Date/Time: 09/04/2021/2:17:47 PM    Final    HYBRID OR IMAGING ([place])  Result Date: 09/05/2021 There is no interpretation for this exam.  This order is for images obtained during a surgical procedure.  Please See "Surgeries" Tab for more information regarding the procedure.     Scheduled Meds:  [MAR Hold] arformoterol  15 mcg Nebulization BID   [MAR Hold] budesonide (PULMICORT) nebulizer solution  0.25 mg Nebulization BID   [MAR Hold] carvedilol  3.125 mg Oral BID WC   [MAR Hold] Chlorhexidine Gluconate Cloth  6 each Topical Daily   [MAR Hold] dapagliflozin propanediol  10 mg Oral Daily   [MAR Hold] digoxin  0.125 mg Oral Daily   [MAR Hold] insulin aspart  0-9 Units Subcutaneous TID WC   [MAR Hold] mouth rinse  15 mL Mouth Rinse BID   [MAR Hold] potassium chloride  20 mEq Oral Daily   [MAR Hold] sacubitril-valsartan  1 tablet Oral BID   [MAR Hold] simvastatin  20 mg Oral QHS   [MAR Hold] spironolactone  25 mg Oral Daily   [MAR Hold] torsemide  20 mg Oral QPM   [MAR Hold] torsemide  40 mg Oral q morning   Continuous Infusions:  sodium chloride Stopped (09/04/21 0648)   [MAR Hold]  ceFAZolin (ANCEF) IV 2 g (09/05/21 1307)  lactated ringers 10 mL/hr at 09/05/21 1514     LOS: 4 days   Time spent: [place], MD [facility] To contact the attending provider between 7A-7P or the covering provider during  after hours 7P-7A, please log into the web site [email or web address] and access using universal [place] password for that web site. If you do not have the password, please call the hospital operator.  09/05/2021, 3:46 PM

## 2021-09-06 ENCOUNTER — Inpatient Hospital Stay: Payer: Self-pay

## 2021-09-06 ENCOUNTER — Encounter (HOSPITAL_COMMUNITY): Payer: Self-pay | Admitting: Cardiology

## 2021-09-06 ENCOUNTER — Other Ambulatory Visit (HOSPITAL_COMMUNITY): Payer: Self-pay

## 2021-09-06 DIAGNOSIS — R7881 Bacteremia: Secondary | ICD-10-CM | POA: Diagnosis not present

## 2021-09-06 DIAGNOSIS — B9561 Methicillin susceptible Staphylococcus aureus infection as the cause of diseases classified elsewhere: Secondary | ICD-10-CM | POA: Diagnosis not present

## 2021-09-06 LAB — TYPE AND SCREEN
ABO/RH(D): A POS
Antibody Screen: NEGATIVE
Unit division: 0
Unit division: 0
Unit division: 0
Unit division: 0

## 2021-09-06 LAB — GLUCOSE, CAPILLARY
Glucose-Capillary: 250 mg/dL — ABNORMAL HIGH (ref 70–99)
Glucose-Capillary: 129 mg/dL — ABNORMAL HIGH (ref 70–99)
Glucose-Capillary: 112 mg/dL — ABNORMAL HIGH (ref 70–99)
Glucose-Capillary: 126 mg/dL — ABNORMAL HIGH (ref 70–99)

## 2021-09-06 LAB — BPAM RBC
Blood Product Expiration Date: 202306102359
Blood Product Expiration Date: 202306112359
Blood Product Expiration Date: 202306112359
Blood Product Expiration Date: 202306112359
ISSUE DATE / TIME: 202305251601
ISSUE DATE / TIME: 202305251601
Unit Type and Rh: 6200
Unit Type and Rh: 6200
Unit Type and Rh: 6200
Unit Type and Rh: 6200

## 2021-09-06 LAB — CULTURE, BLOOD (ROUTINE X 2): Culture: NO GROWTH

## 2021-09-06 MED ORDER — CEFAZOLIN IV (FOR PTA / DISCHARGE USE ONLY): 2.0000 g | Freq: Three times a day (TID) | INTRAVENOUS | 0 refills | Status: AC

## 2021-09-06 MED ORDER — DAPAGLIFLOZIN PROPANEDIOL 10 MG PO TABS
10.0000 mg | ORAL_TABLET | Freq: Every day | ORAL | 3 refills | Status: DC
  Filled 2021-09-06: qty 90, 90d supply, fill #0

## 2021-09-06 MED ORDER — TORSEMIDE 40 MG PO TABS
40.0000 mg | ORAL_TABLET | Freq: Every morning | ORAL | 1 refills | Status: DC
  Filled 2021-09-06: qty 30, fill #0

## 2021-09-06 MED ORDER — DAPAGLIFLOZIN PROPANEDIOL 10 MG PO TABS
10.0000 mg | ORAL_TABLET | Freq: Every day | ORAL | 3 refills | Status: DC
  Filled 2021-09-06 – 2021-12-05 (×2): qty 90, 90d supply, fill #0
  Filled 2022-03-04: qty 90, 90d supply, fill #1
  Filled 2022-06-16: qty 90, 90d supply, fill #2

## 2021-09-06 MED ORDER — TORSEMIDE 20 MG PO TABS
20.0000 mg | ORAL_TABLET | Freq: Every evening | ORAL | 1 refills | Status: DC
  Filled 2021-09-06: qty 30, 10d supply, fill #0

## 2021-09-06 MED ORDER — TORSEMIDE 20 MG PO TABS
20.0000 mg | ORAL_TABLET | Freq: Every evening | ORAL | 1 refills | Status: DC
  Filled 2021-09-06: qty 30, 30d supply, fill #0

## 2021-09-06 MED ORDER — CEFAZOLIN IV (FOR PTA / DISCHARGE USE ONLY): 2.0000 g | Freq: Three times a day (TID) | INTRAVENOUS | 0 refills | Status: DC

## 2021-09-06 MED FILL — Gentamicin Sulfate Inj 40 MG/ML: INTRAMUSCULAR | Qty: 80 | Status: AC

## 2021-09-06 NOTE — Care Management (Signed)
1015 09-06-21 Case Manager submitted clinicals to [place] for life vest via fax @ [phone]. [place] Liaison will contact the Unit Case Manager regarding insurance approval and fit time for the patient. Unit Case Manager will continue to follow.

## 2021-09-06 NOTE — Anesthesia Postprocedure Evaluation (Signed)
Anesthesia Post Note  Patient: Kevin Cardenas  Procedure(s) Performed: DEFIBRILLATOR GENERATOR REMOVAL TRANSESOPHAGEAL ECHOCARDIOGRAM (TEE) ICD LASER LEAD REMOVAL     Patient location during evaluation: PACU Anesthesia Type: General Level of consciousness: awake and alert Pain management: pain level controlled Vital Signs Assessment: post-procedure vital signs reviewed and stable Respiratory status: spontaneous breathing, nonlabored ventilation, respiratory function stable and patient connected to nasal cannula oxygen Cardiovascular status: blood pressure returned to baseline and stable Postop Assessment: no apparent nausea or vomiting Anesthetic complications: no   No notable events documented.  Last Vitals:  Vitals:   09/06/21 0751 09/06/21 1146  BP: 102/75 100/76  Pulse: 86 85  Resp: 17 17  Temp: 36.8 C 36.4 C  SpO2: 98% 95%    Last Pain:  Vitals:   09/06/21 1146  TempSrc: Oral  PainSc:                  [place]

## 2021-09-06 NOTE — Progress Notes (Addendum)
Progress Note  Patient Name: Kevin Cardenas Date of Encounter: 09/06/2021  [facility] Cardiologist: [name], MD   Subjective   Doing well, no complaints  Inpatient Medications    Scheduled Meds:  arformoterol  15 mcg Nebulization BID   budesonide (PULMICORT) nebulizer solution  0.25 mg Nebulization BID   carvedilol  3.125 mg Oral BID WC   Chlorhexidine Gluconate Cloth  6 each Topical Daily   dapagliflozin propanediol  10 mg Oral Daily   digoxin  0.125 mg Oral Daily   insulin aspart  0-9 Units Subcutaneous TID WC   mouth rinse  15 mL Mouth Rinse BID   potassium chloride  20 mEq Oral Daily   sacubitril-valsartan  1 tablet Oral BID   simvastatin  20 mg Oral QHS   spironolactone  25 mg Oral Daily   torsemide  20 mg Oral QPM   torsemide  40 mg Oral q morning   Continuous Infusions:  sodium chloride Stopped (09/04/21 0648)    ceFAZolin (ANCEF) IV 2 g (09/06/21 0528)   PRN Meds: acetaminophen, docusate sodium, ipratropium-albuterol, ondansetron (ZOFRAN) IV, polyethylene glycol   Vital Signs    Vitals:   09/05/21 2308 09/06/21 0003 09/06/21 0352 09/06/21 0751  BP: 116/76  115/84 102/75  Pulse: (!) 103 (!) 103 80 86  Resp: _0 Temp: 98.6 F (37 C)  97.8 F (36.6 C) 98.3 F (36.8 C)  TempSrc: Oral  Oral Oral  SpO2: (!) 89% 95% 97% 98%  Weight:      Height:        Intake/Output Summary (Last 24 hours) at 09/06/2021 0915 Last data filed at 09/06/2021 0754 Gross per 24 hour  Intake 1287.83 ml  Output 1960 ml  Net -672.17 ml      09/05/2021    2:29 PM 09/04/2021    7:05 AM 09/04/2021    5:00 AM  Last 3 Weights  Weight (lbs) 235 lb 238 lb 1.6 oz 238 lb 1.6 oz  Weight (kg) 106.595 kg 108 kg 108 kg      Telemetry    SR/ST 70's-100's - Personally Reviewed  ECG    No new EKGs - Personally Reviewed  Physical Exam   Examined by Dr. Quentin Ore GEN: No acute distress.   Neck: No JVD Cardiac: RRR, no murmurs, rubs, or gallops.  Respiratory:  CTA b/l. GI: Soft, nontender, non-distended  MS: No edema; No deformity. Neuro:  Nonfocal  Psych: Normal affect   Extraction site: stable, no hematoma, bleeding, sutures in place  Labs    High Sensitivity Troponin:   Recent Labs  Lab 09/01/21 1423 09/01/21 1713  TROPONINIHS 6 8     Chemistry Recent Labs  Lab 09/01/21 1423 09/02/21 0418 09/03/21 0209 09/04/21 0258 09/05/21 0208  NA 135 137 134* 135 136  K 4.3 4.2 3.6 3.9 3.6  CL 98 102 99 97* 93*  CO2 _1 33*  GLUCOSE 113* 125* 149* 115* 109*  BUN 22* _2 CREATININE 1.51* 1.30* 0.93 0.95 0.94  CALCIUM 8.7* 8.3* 8.4* 8.6* 8.4*  MG  --  2.1 2.2 2.2  --   PROT 7.4  --   --   --   --   ALBUMIN 3.8  --   --   --   --   AST 23  --   --   --   --   ALT 20  --   --   --   --  ALKPHOS 57  --   --   --   --   BILITOT 1.1  --   --   --   --   GFRNONAA 57* >60 >60 >60 >60  ANIONGAP _0 Lipids No results for input(s): CHOL, TRIG, HDL, LABVLDL, LDLCALC, CHOLHDL in the last 168 hours.  Hematology Recent Labs  Lab 09/03/21 0209 09/04/21 0258 09/05/21 0208  WBC 4.1 4.5 4.7  RBC 4.12* 4.19* 4.16*  HGB 13.0 13.1 13.0  HCT 40.8 40.8 40.2  MCV 99.0 97.4 96.6  MCH 31.6 31.3 31.3  MCHC 31.9 32.1 32.3  RDW 14.6 14.4 14.5  PLT 89* 97* 106*   Thyroid No results for input(s): TSH, FREET4 in the last 168 hours.  BNP Recent Labs  Lab 09/01/21 1713 09/02/21 0526  BNP 130.8* 231.9*    DDimer  Recent Labs  Lab 09/01/21 1713  DDIMER 2.52*     Radiology     Cardiac Studies   09/04/21:: TEE 1. No residual VSD noted. Left ventricular ejection fraction, by  estimation, is 35 to 40%. The left ventricle has moderately decreased  function. The left ventricle demonstrates global hypokinesis. There is  mild left ventricular hypertrophy.   2. There is a 0.5 x 0.9 cm vegetation attached to the ICD lead as it  passes through the right atrium. Right ventricular systolic function is  moderately  reduced. The right ventricular size is mildly enlarged.   3. Left atrial size was mildly dilated. No left atrial/left atrial  appendage thrombus was detected.   4. Right atrial size was mildly dilated.   5. No MV vegetation. The mitral valve is normal in structure. No evidence  of mitral valve regurgitation. No evidence of mitral stenosis.   6. Peak RV-RA gradient 17 mmHg. No TV vegetation.   7. No aortic valve vegetation. The aortic valve is tricuspid. Aortic  valve regurgitation is not visualized. No aortic stenosis is present.   8. Peak pulmonic valve gradient 13 mmHg. No PV vegetation. Mild pulmonic  stenosis.   9. PFO by color doppler.    09/02/21: TTE  1. Left ventricular ejection fraction, by estimation, is 35%. The left  ventricle has moderately decreased function. The left ventricle  demonstrates global hypokinesis. Left ventricular diastolic parameters are  indeterminate. No residual VSD visualized  by color doppler.   2. Right ventricular systolic function is normal. The right ventricular  size is moderately enlarged. There is mildly elevated pulmonary artery  systolic pressure. The estimated right ventricular systolic pressure is  32.4 mmHg.   3. The mitral valve is normal in structure. Trivial mitral valve  regurgitation.   4. The aortic valve is tricuspid. Aortic valve regurgitation is not  visualized.   5. The inferior vena cava is dilated in size with >50% respiratory  variability, suggesting right atrial pressure of 8 mmHg.   6. No significant pulmonic stenosis. Peak gradient 1mHg.   Comparison(s): Compared to prior TTE in 06/2021, there is no significant  change.      06/24/2021: R/LHC 1. Normal coronaries.  2. Normal filling pressures.  3. Normal cardiac output.  4. Mild pulmonary hypertension, suspect related to OHS/OSA.   Patient Profile     46y.o. male  with a hx of  VSD repair and removal of a pulmonary artery band at age 89 DM, HTN, chronic CHF  (systolic), NICM, OSA (not on CPAP), there is mention of restrictive lung disease as well,  admitted with fever/sepsis, found with MSSA bacteremia  Assessment & Plan    Sepsis Off pressors, VSS Back on his HF meds  MSSA bacteremia ?source leg wound, poor dentition  CIED associated infection w/vegetation on device lead  4. NICM S/p ICD system extraction yesterday New BC drawn, pending Site care and activitiy restrictions d/w the patient Wound check/suture removal and EP follow up visits are arranged He did have some fast NSVTs on his device interrogation pre-op. recommend life vest prior to discharge I have ordered the vest and contacted the [employer] rep.    For questions or updates, please contact [place] Please consult [email or web address] for contact info under        Signed, Baldwin [place], PA-C  09/06/2021, 9:15 AM

## 2021-09-06 NOTE — Progress Notes (Addendum)
Patient ID: Kevin Cardenas, male   DOB: 1975/05/14, 46 y.o.   MRN: [id number]     Advanced Heart Failure Rounding Note  PCP-Cardiologist: [name], MD   Subjective:    Admitted w/ MSSA bacteremia. AF w/ abx. ID following.   Echo: EF 35%, moderate RV enlargement, normal RV function, no significant pulmonary valve stenosis noted, no obvious vegetation.   TEE: LV EF 35-40%, mild RV dilation with moderate RV dysfunction, mild PS (mean gradient 13 mmHg), no VSD, there is a small 0.5 x 0.9 cm vegetation on the ICD lead as it traverses the RA, no valvular vegetation noted.   S/p ICD extraction 05/25  Repeat BC X 2 pending.  No labs today. Check BMET and CBC.  Feeling well. Mild tenderness at ICD extraction site, no hematoma. No dyspnea.   Objective:   Weight Range: 106.6 kg Body mass index is 37.93 kg/m.   Vital Signs:   Temp:  [97.8 F (36.6 C)-98.6 F (37 C)] 98.3 F (36.8 C) (05/26 0751) Pulse Rate:  [76-103] 86 (05/26 0751) Resp:  [14-19] 17 (05/26 0751) BP: (100-142)/(64-84) 102/75 (05/26 0751) SpO2:  [89 %-100 %] 98 % (05/26 0751) Weight:  [106.6 kg] 106.6 kg (05/25 1429) Last BM Date : 09/04/21  Weight change: Filed Weights   09/04/21 0500 09/04/21 0705 09/05/21 1429  Weight: 108 kg 108 kg 106.6 kg    Intake/Output:   Intake/Output Summary (Last 24 hours) at 09/06/2021 0904 Last data filed at 09/06/2021 0754 Gross per 24 hour  Intake 1287.83 ml  Output 1960 ml  Net -672.17 ml       Physical Exam    General:  No distress. Sitting up on side of bed HEENT: normal Neck: supple. no JVD. Carotids 2+ bilat; no bruits.  Cor: PMI nondisplaced. Regular rate & rhythm. No rubs, gallops or murmurs. ICD extraction site stable. No hematoma.  Lungs: clear Abdomen: obese, soft, nontender, nondistended.  Extremities: no cyanosis, clubbing, rash, edema Neuro: alert & orientedx3, cranial nerves grossly intact. moves all 4 extremities w/o difficulty. Affect  pleasant    Telemetry   SR 70s-80s  Labs    CBC Recent Labs    09/04/21 0258 09/05/21 0208  WBC 4.5 4.7  HGB 13.1 13.0  HCT 40.8 40.2  MCV 97.4 96.6  PLT 97* 252*   Basic Metabolic Panel Recent Labs    09/04/21 0258 09/05/21 0208  NA 135 136  K 3.9 3.6  CL 97* 93*  CO2 29 33*  GLUCOSE 115* 109*  BUN 11 15  CREATININE 0.95 0.94  CALCIUM 8.6* 8.4*  MG 2.2  --    Liver Function Tests No results for input(s): AST, ALT, ALKPHOS, BILITOT, PROT, ALBUMIN in the last 72 hours.  No results for input(s): LIPASE, AMYLASE in the last 72 hours. Cardiac Enzymes No results for input(s): CKTOTAL, CKMB, CKMBINDEX, TROPONINI in the last 72 hours.  BNP: BNP (last 3 results) Recent Labs    06/19/21 0957 09/01/21 1713 09/02/21 0526  BNP 235.5* 130.8* 231.9*    ProBNP (last 3 results) No results for input(s): PROBNP in the last 8760 hours.   D-Dimer No results for input(s): DDIMER in the last 72 hours.  Hemoglobin A1C No results for input(s): HGBA1C in the last 72 hours. Fasting Lipid Panel No results for input(s): CHOL, HDL, LDLCALC, TRIG, CHOLHDL, LDLDIRECT in the last 72 hours. Thyroid Function Tests No results for input(s): TSH, T4TOTAL, T3FREE, THYROIDAB in the last 72 hours.  Invalid  input(s): FREET3  Other results:   Imaging    HYBRID OR IMAGING ([place])  Result Date: 09/05/2021 There is no interpretation for this exam.  This order is for images obtained during a surgical procedure.  Please See "Surgeries" Tab for more information regarding the procedure.     Medications:     Scheduled Medications:  arformoterol  15 mcg Nebulization BID   budesonide (PULMICORT) nebulizer solution  0.25 mg Nebulization BID   carvedilol  3.125 mg Oral BID WC   Chlorhexidine Gluconate Cloth  6 each Topical Daily   dapagliflozin propanediol  10 mg Oral Daily   digoxin  0.125 mg Oral Daily   insulin aspart  0-9 Units Subcutaneous TID WC   mouth rinse  15 mL Mouth  Rinse BID   potassium chloride  20 mEq Oral Daily   sacubitril-valsartan  1 tablet Oral BID   simvastatin  20 mg Oral QHS   spironolactone  25 mg Oral Daily   torsemide  20 mg Oral QPM   torsemide  40 mg Oral q morning    Infusions:  sodium chloride Stopped (09/04/21 0648)    ceFAZolin (ANCEF) IV 2 g (09/06/21 0528)    PRN Medications: acetaminophen, docusate sodium, ipratropium-albuterol, ondansetron (ZOFRAN) IV, polyethylene glycol    Assessment/Plan   1. Septic shock with MSSA bacteremia: ?Source from RLE cellulitis.  PCT 25 initially with high fever and hypotension requiring norepinephrine, now off pressor.  Blood cultures 5/21 with MSSA.  Has St Jude ICD => TEE 5/24 showed 0.5 x 0.9 cm vegetation on the ICD lead as it traverses the RA. No valvular vegetation noted.  - Continue cefazolin, ID following. - S/p ICD system extraction 05/25  - Per ID, will need cefazolin x 6 wks. Can place PICC if BC with NG X 48 hrs. - Labs today. 2. Acute on chronic systolic CHF: Nonischemic cardiomyopathy.  RHC in 4/16 showed preserved cardiac index by thermodilution but low by Fick.   Last echo in 8/21 showed EF 30-35%, normal RV, no residual VSD, no definite pulmonic stenosis.  Echo in 3/23 showed EF 30-35%, mild LVH, no VSD, no significant pulmonary stenosis, moderate RVE with normal RV systolic function. LHC/RHC in 3/23 with no coronary disease, CI 3.28.  He has a [employer], [employer] ICD.  Echo this admission looked similar to 3/23, no definite vegetation noted.  TEE showed LV EF 35-40%, mild RV dilation with moderate RV dysfunction, mild PS (mean gradient 13 mmHg), no VSD, there is a small 0.5 x 0.9 cm vegetation on the ICD lead as it traverses the RA, no valvular vegetation noted.  He diuresed well with IV Lasix. - Volume looks good today. Continue home torsemide 40 qam/20 qpm.  - Continue Coreg 3.125 mg bid.  - Continue Entresto 24/26 bid (was on 49/51 bid at home, can increase to home dose prior to  discharge). - Continue spironolactone 25 mg daily.  - Continue digoxin 0.125 daily.   - Continue Farxiga 10 mg daily.   - ICD system extracted 05/25 d/t endocarditis. Future plan per EP.    3. H/o VSD: No residual shunt noted 3/23 echo.  Last RHC also did not suggest any significant left to right shunting. TEE this admit showed no definite VSD.  4. Pulmonary stenosis: Mild on TEE, peak gradient 13 mmHg.  5. OSA: CPAP at night.    Length of Stay: 5  FINCH, LINDSAY N, PA-C  09/06/2021, 9:04 AM  Advanced Heart Failure Team Pager  [phone] (M-F; 7a - 5p)  Please contact [facility] for night-coverage after hours (5p -7a ) and weekends on [email or web address]   Patient seen with PA, agree with the above note.   ICD extracted yesterday.  No complaints today, no dyspnea or chest pain.   Latest blood cultures NGTD.   General: NAD Neck: No JVD, no thyromegaly or thyroid nodule.  Lungs: Clear to auscultation bilaterally with normal respiratory effort. CV: Nondisplaced PMI.  Heart regular S1/S2, no S3/S4, no murmur.  No peripheral edema.   Abdomen: Soft, nontender, no hepatosplenomegaly, no distention.  Skin: Intact without lesions or rashes.  Neurologic: Alert and oriented x 3.  Psych: Normal affect. Extremities: No clubbing or cyanosis.  HEENT: Normal.   Patient noted to have some NSVT on interrogation of device prior to removal.  EP recommending Lifevest, this has been ordered.  Once endocarditis has been fully treated, think he would be a good subcutaneous defibrillator candidate.   After 48 hrs negative blood cultures, he can get PICC and go home on cefazolin to complete 6 wks therapy.   HF is well-controlled on current regimen.  Would increase Entresto back to 49/51 bid prior to discharge.   [name] 09/06/2021 10:52 AM

## 2021-09-06 NOTE — Care Management Important Message (Signed)
Important Message  Patient Details  Name: Kevin Cardenas MRN: [id number] Date of Birth: 07-Nov-1975   Medicare Important Message Given:  Yes     Ananda Sitzer 09/06/2021, 3:50 PM

## 2021-09-06 NOTE — TOC Initial Note (Addendum)
Transition of Care [facility]) - Initial/Assessment Note    Patient Details  Name: Kevin Cardenas MRN: [id number] Date of Birth: 06-20-1975  [facility] of [facility] CM/SW Contact:    Marilu Favre, RN Phone Number: 09/06/2021, 11:46 AM  Clinical Narrative:                 Patient from home with wife. Confirmed face sheet information.   Discussed [place] liaison waiting on insurance authorization for life vest. Once received a [employer] nurse will come to the hospital and fit patient for vest and provide education to him and wife.   Also discussed IV antibiotics at home. Infusion company will be [employer], who has a nurse Pam who will come to hospital and provide education prior to discharge. Patient will have a [place] , but HHRN will not be present every time a dose is due. Patient voiced understanding    Anderson Malta with [facility] unable to accept due to staffing. Levada Dy with [facility] under to accept due to staffing  Cindie with [facility] reviewing referral    Cindie with [facility] can accept referral   [place] liaison called, she has received insurance authorization for life vest. Patient will be fitted tonight. Expected Discharge Plan: [place] Barriers to Discharge: Continued Medical Work up   Patient Goals and CMS Choice Patient states their goals for this hospitalization and ongoing recovery are:: to return to home CMS Medicare.gov Compare Post Acute Care list provided to:: Patient Choice offered to / list presented to : Patient  Expected Discharge Plan and Services Expected Discharge Plan: [place]   Discharge Planning Services: CM Consult Post Acute Care Choice: [place] arrangements for the past 2 months: Single Family Home                   DME Agency: NA       HH Arranged: RN          Prior Living Arrangements/Services Living arrangements for the past 2 months: Single Family Home Lives  with:: Spouse Patient language and need for interpreter reviewed:: Yes Do you feel safe going back to the place where you live?: Yes      Need for Family Participation in Patient Care: Yes (Comment) Care giver support system in place?: Yes (comment)   Criminal Activity/Legal Involvement Pertinent to Current Situation/Hospitalization: No - Comment as needed  Activities of Daily Living      Permission Sought/Granted   Permission granted to share information with : No              Emotional Assessment Appearance:: Appears stated age Attitude/Demeanor/Rapport: Engaged Affect (typically observed): Accepting Orientation: : Oriented to Self, Oriented to Place, Oriented to  Time, Oriented to Situation Alcohol / Substance Use: Not Applicable Psych Involvement: No (comment)  Admission diagnosis:  Septic shock ([place]) [A41.9, R65.21] Sepsis, due to unspecified organism, unspecified whether acute organ dysfunction present [facility]) [A41.9] Patient Active Problem List   Diagnosis Date Noted   Cardiac device in situ 09/03/2021   MSSA bacteremia 09/03/2021   Septic shock ([place]) 09/01/2021   Acute on chronic systolic and diastolic heart failure, NYHA class 3 ([place]) 06/19/2021   HFrEF (heart failure with reduced ejection fraction) ([place]) 11/06/2019   AKI (acute kidney injury) ([place]) 11/05/2019   Orthostatic hypotension 11/04/2019   Class 2 obesity due to excess calories with serious comorbidity in adult 01/16/2016   Asthma, stable, mild intermittent  01/15/2016   OSA on CPAP 06/21/2014   Routine general medical examination at a health care facility 04/18/2014   Hyperlipidemia with target LDL less than 100 04/17/2014   Non-ischemic cardiomyopathy ([place]) 11/24/8869   Systolic CHF, chronic ([place]) 02/06/2011   VSD (ventricular septal defect) 02/06/2011   HTN (hypertension) 02/06/2011   Prediabetes 01/30/2011   PCP:  Dorna Mai, MD Pharmacy:   [facility] [address] [place] [address] Phone: [phone] Fax: [phone]     Social Determinants of Health (SDOH) Interventions    Readmission Risk Interventions     View : No data to display.

## 2021-09-06 NOTE — Progress Notes (Signed)
PROGRESS NOTE   Kevin Cardenas  NOM:[id number] DOB: 1976/03/04 DOA: 09/01/2021 PCP: Dorna Mai, MD  Brief Narrative:  46 year old community dwelling black male VSD repair + pulmonary artery banding at age 21--NICM + [place] ICD 01/2015 with resulting HFrEF NYHA class II symptoms at follow-up 4/23 (last echo 3/20 = EF 25%)-follows chronically with AHF team-last OV apparently 20 pounds >usual OSA no CPAP-+ restrictive lung disease Recent varicose vein repair R LE  5/21: present MC ed chills dyspnea febrile 103 lactic acidosis procalcitonin 24 systolic BP 70 Admitted by CCM-started on pressors-septic shock secondary to staph cellulitis RLE 5/22: ID consulted/AHF team consulted 5/22: Off pressors 5/24:Transfer to [facility] --- TEE showed infected pacemaker lead 5/25: Blood culture to be repeated this p.m.--PPM explanted 5/26:      Hospital-Problem based course  Sepsis 2/2 MSSA from PPM lead  Thrombocytopenia extraction per EP Abx [Ancef currently--ordered as OPAT till 10/17/21] as per ID Follow 5/27b BC x2--if neg--PICC to be placed and can d/c home Has dental caries on tooth #2--will need definitive extraction--family aware to go to dentist Life-Vest to be delivered 5/26 VSD/Pulm banding HFrEF NYHA class II-III symptoms EF 25% NICM  [secondarily placed PPM due to NUICM?] Defer to cardiology: digoxin 0.125, Entresto [increase dose on d/c]-Aldactone 25 torsemide as per cardiology Coreg 3.125 twice daily, Farxiga 10 qd  weight seems stable in the 106 to 108 kg range OSA not on CPAP with restrictive lung disease Continue Brovana Pulmicort DuoNeb-as an outpatient probably needs to titrate to a different device  DVT prophylaxis: SCD Code Status: Full Family Communication: Discussed with wife at bedside Disposition:  Status is: Inpatient Remains inpatient appropriate because:    Consultants:    Procedures:   Antimicrobials:     Subjective:  Some pain at chest site after  PPM No distress  No fever  Objective: Vitals:   09/06/21 0003 09/06/21 0352 09/06/21 0751 09/06/21 1146  BP:  115/84 102/75 100/76  Pulse: (!) 103 80 86 85  Resp: _0 Temp:  97.8 F (36.6 C) 98.3 F (36.8 C) 97.6 F (36.4 C)  TempSrc:  Oral Oral Oral  SpO2: 95% 97% 98% 95%  Weight:      Height:        Intake/Output Summary (Last 24 hours) at 09/06/2021 1459 Last data filed at 09/06/2021 1016 Gross per 24 hour  Intake 1287.83 ml  Output 2460 ml  Net -1172.17 ml    Filed Weights   09/04/21 0500 09/04/21 0705 09/05/21 1429  Weight: 108 kg 108 kg 106.6 kg    Examination:  Awake coherent no distress Chest clear no rales rhonchi Holosystolic murmur across precordium Abdomen is obese nontender no rebound no guarding ROM is intact Neurologically intact moving 4 limbs equally no focal deficit noted grossly Psych euthymic pleasant coherent  Data Reviewed: personally reviewed   CBC    Component Value Date/Time   WBC 4.7 09/05/2021 0208   RBC 4.16 (L) 09/05/2021 0208   HGB 13.0 09/05/2021 0208   HGB 14.4 07/24/2021 0939   HCT 40.2 09/05/2021 0208   HCT 41.9 07/24/2021 0939   PLT 106 (L) 09/05/2021 0208   PLT 182 07/24/2021 0939   MCV 96.6 09/05/2021 0208   MCV 93 07/24/2021 0939   MCH 31.3 09/05/2021 0208   MCHC 32.3 09/05/2021 0208   RDW 14.5 09/05/2021 0208   RDW 12.6 07/24/2021 0939   LYMPHSABS 0.3 (L) 09/02/2021 0418   LYMPHSABS 1.0 04/17/2021 1047  MONOABS 0.4 09/02/2021 0418   EOSABS 0.0 09/02/2021 0418   EOSABS 0.1 04/17/2021 1047   BASOSABS 0.0 09/02/2021 0418   BASOSABS 0.0 04/17/2021 1047      Latest Ref Rng & Units 09/05/2021    2:08 AM 09/04/2021    2:58 AM 09/03/2021    2:09 AM  CMP  Glucose 70 - 99 mg/dL 109   115   149    BUN 6 - 20 mg/dL _0 Creatinine 0.61 - 1.24 mg/dL 0.94   0.95   0.93    Sodium 135 - 145 mmol/L 136   135   134    Potassium 3.5 - 5.1 mmol/L 3.6   3.9   3.6    Chloride 98 - 111 mmol/L 93   97   99     CO2 22 - 32 mmol/L 33   29   27    Calcium 8.9 - 10.3 mg/dL 8.4   8.6   8.4       Radiology Studies: [place] EKG SITE RITE  Result Date: 09/06/2021 If Site Rite image not attached, placement could not be confirmed due to current cardiac rhythm.  HYBRID OR IMAGING ([facility] ONLY)  Result Date: 09/05/2021 There is no interpretation for this exam.  This order is for images obtained during a surgical procedure.  Please See "Surgeries" Tab for more information regarding the procedure.     Scheduled Meds:  arformoterol  15 mcg Nebulization BID   budesonide (PULMICORT) nebulizer solution  0.25 mg Nebulization BID   carvedilol  3.125 mg Oral BID WC   Chlorhexidine Gluconate Cloth  6 each Topical Daily   dapagliflozin propanediol  10 mg Oral Daily   digoxin  0.125 mg Oral Daily   insulin aspart  0-9 Units Subcutaneous TID WC   mouth rinse  15 mL Mouth Rinse BID   potassium chloride  20 mEq Oral Daily   sacubitril-valsartan  1 tablet Oral BID   simvastatin  20 mg Oral QHS   spironolactone  25 mg Oral Daily   torsemide  20 mg Oral QPM   torsemide  40 mg Oral q morning   Continuous Infusions:  sodium chloride Stopped (09/04/21 0648)    ceFAZolin (ANCEF) IV 2 g (09/06/21 0528)     LOS: 5 days   Time spent: [place], MD [facility] To contact the attending provider between 7A-7P or the covering provider during after hours 7P-7A, please log into the web site [email or web address] and access using universal [place] password for that web site. If you do not have the password, please call the hospital operator.  09/06/2021, 2:59 PM

## 2021-09-06 NOTE — Plan of Care (Signed)
  Problem: Education: Goal: Knowledge of General Education information will improve Description: Including pain rating scale, medication(s)/side effects and non-pharmacologic comfort measures Outcome: Progressing   Problem: Education: Goal: Knowledge of General Education information will improve Description: Including pain rating scale, medication(s)/side effects and non-pharmacologic comfort measures Outcome: Progressing   Problem: Health Behavior/Discharge Planning: Goal: Ability to manage health-related needs will improve Outcome: Progressing   Problem: Clinical Measurements: Goal: Ability to maintain clinical measurements within normal limits will improve Outcome: Progressing Goal: Respiratory complications will improve Outcome: Progressing

## 2021-09-06 NOTE — Progress Notes (Signed)
PHARMACY CONSULT NOTE FOR:  OUTPATIENT  PARENTERAL ANTIBIOTIC THERAPY (OPAT)  Indication: bacteremia Regimen: Cefazolin 2g IV q 8h End date: 10/17/2021  IV antibiotic discharge orders are pended. To discharging provider:  please sign these orders via discharge navigator,  Select New Orders & click on the button choice - Manage This Unsigned Work.    Thank you for involving pharmacy in this patient's care.  Elita Quick, PharmD PGY1 Ambulatory Care Pharmacy Resident 09/06/2021 8:53 AM  **Pharmacist phone directory can be found on [email or web address] listed under [place]**

## 2021-09-06 NOTE — Progress Notes (Addendum)
[facility] for Infectious Disease  Date of Admission:  09/01/2021   Total days of inpatient antibiotics 4  Principal Problem:   MSSA bacteremia Active Problems:   Septic shock ([place])   Cardiac device in situ          Assessment: 52 YM with HF, NICM with ICD implante din 2016 admitted with MSSA bacteremia    #MSSA bacteremia 2/2 ICD infection #ICD placed in 2016 #RLE wound-chronic #Septic shock requiring pressors #DM -TEE showed 0.5 x 0.9 cm vegetation on ICD lead as it passes through RA - ICD extracted on 5/25. Heart failure team following, once IE fully treated then considering subq defibrillator candidate. Recommendations: -Continue cefazolin for 6 weeks from OR -Follow repeat blood Cx to ensure clearance -Follow-up with ID  OPAT ORDERS:  Diagnosis: MSSA bacteremia with endocarditis  Culture Result: Blood Cx+ MSSA  Allergies  Allergen Reactions   Metolazone Other (See Comments)    "Dried up my kidneys- took only one tablet"     Discharge antibiotics to be given via PICC line:  Per pharmacy protocol cefazolin 2gm q8h    Duration: 6 weeks End Date: 10/16/21  [facility] Care Per Protocol with [employer] Use: Home health RN for IV administration and teaching, line care and labs.    Labs weekly while on IV antibiotics: __ CBC with differential __ CMP __ CRP __ ESR   __ Please pull PIC at completion of IV antibiotics   Fax weekly labs to [phone]  Clinic Follow Up Appt: 6/9  @ [facility] with Janene Madeira Microbiology:   Antibiotics: Cefepime 5/21 Metronidazole 5/21 Vancomycin 5/21 Cefazolin 5/22-p Cultures: Blood 5/21 1/2 MSSA 5/22 NG 5/25 pending   SUBJECTIVE: Resting in bed. No new complaints Interval: Afebrile overnight Review of Systems: Review of Systems  All other systems reviewed and are negative.   Scheduled Meds:  arformoterol  15 mcg Nebulization BID   budesonide (PULMICORT) nebulizer solution  0.25 mg  Nebulization BID   carvedilol  3.125 mg Oral BID WC   Chlorhexidine Gluconate Cloth  6 each Topical Daily   dapagliflozin propanediol  10 mg Oral Daily   digoxin  0.125 mg Oral Daily   insulin aspart  0-9 Units Subcutaneous TID WC   mouth rinse  15 mL Mouth Rinse BID   potassium chloride  20 mEq Oral Daily   sacubitril-valsartan  1 tablet Oral BID   simvastatin  20 mg Oral QHS   spironolactone  25 mg Oral Daily   torsemide  20 mg Oral QPM   torsemide  40 mg Oral q morning   Continuous Infusions:  sodium chloride Stopped (09/04/21 0648)    ceFAZolin (ANCEF) IV 2 g (09/06/21 0528)   PRN Meds:.acetaminophen, docusate sodium, ipratropium-albuterol, ondansetron (ZOFRAN) IV, polyethylene glycol Allergies  Allergen Reactions   Metolazone Other (See Comments)    "Dried up my kidneys- took only one tablet"    OBJECTIVE: Vitals:   09/06/21 0003 09/06/21 0352 09/06/21 0751 09/06/21 1146  BP:  115/84 102/75 100/76  Pulse: (!) 103 80 86 85  Resp: _0 Temp:  97.8 F (36.6 C) 98.3 F (36.8 C) 97.6 F (36.4 C)  TempSrc:  Oral Oral Oral  SpO2: 95% 97% 98% 95%  Weight:      Height:       Body mass index is 37.93 kg/m.  Physical Exam Constitutional:      General: He is not in acute  distress.    Appearance: He is normal weight. He is not toxic-appearing.  HENT:     Head: Normocephalic and atraumatic.     Right Ear: External ear normal.     Left Ear: External ear normal.     Nose: No congestion or rhinorrhea.     Mouth/Throat:     Mouth: Mucous membranes are moist.     Pharynx: Oropharynx is clear.  Eyes:     Extraocular Movements: Extraocular movements intact.     Conjunctiva/sclera: Conjunctivae normal.     Pupils: Pupils are equal, round, and reactive to light.  Cardiovascular:     Rate and Rhythm: Normal rate and regular rhythm.     Heart sounds: No murmur heard.   No friction rub. No gallop.     Comments: Chest wall wound sutured Pulmonary:     Effort:  Pulmonary effort is normal.     Breath sounds: Normal breath sounds.  Abdominal:     General: Abdomen is flat. Bowel sounds are normal.     Palpations: Abdomen is soft.  Musculoskeletal:        General: No swelling. Normal range of motion.     Cervical back: Normal range of motion and neck supple.  Skin:    General: Skin is warm and dry.  Neurological:     General: No focal deficit present.     Mental Status: He is oriented to person, place, and time.  Psychiatric:        Mood and Affect: Mood normal.      Lab Results Lab Results  Component Value Date   WBC 4.7 09/05/2021   HGB 13.0 09/05/2021   HCT 40.2 09/05/2021   MCV 96.6 09/05/2021   PLT 106 (L) 09/05/2021    Lab Results  Component Value Date   CREATININE 0.94 09/05/2021   BUN 15 09/05/2021   NA 136 09/05/2021   K 3.6 09/05/2021   CL 93 (L) 09/05/2021   CO2 33 (H) 09/05/2021    Lab Results  Component Value Date   ALT 20 09/01/2021   AST 23 09/01/2021   ALKPHOS 57 09/01/2021   BILITOT 1.1 09/01/2021        Laurice Record, [facility] 09/06/2021, 3:20 PM

## 2021-09-06 NOTE — Discharge Instructions (Signed)
Wound care defibrillator site Keep wound clean and dry Do not wash or shower  Sponge bath until your wound check visit and sutures removed OK to keep loose cover/dressing to change as needed.

## 2021-09-07 DIAGNOSIS — Z959 Presence of cardiac and vascular implant and graft, unspecified: Secondary | ICD-10-CM

## 2021-09-07 LAB — COMPREHENSIVE METABOLIC PANEL
ALT: 92 U/L — ABNORMAL HIGH (ref 0–44)
AST: 105 U/L — ABNORMAL HIGH (ref 15–41)
Albumin: 3.3 g/dL — ABNORMAL LOW (ref 3.5–5.0)
Alkaline Phosphatase: 55 U/L (ref 38–126)
Anion gap: 12 (ref 5–15)
BUN: 23 mg/dL — ABNORMAL HIGH (ref 6–20)
CO2: 31 mmol/L (ref 22–32)
Calcium: 8.9 mg/dL (ref 8.9–10.3)
Chloride: 93 mmol/L — ABNORMAL LOW (ref 98–111)
Creatinine, Ser: 1.01 mg/dL (ref 0.61–1.24)
GFR, Estimated: >60 mL/min (ref >60)
Glucose, Bld: 161 mg/dL — ABNORMAL HIGH (ref 70–99)
Potassium: 3.4 mmol/L — ABNORMAL LOW (ref 3.5–5.1)
Sodium: 136 mmol/L (ref 135–145)
Total Bilirubin: 0.5 mg/dL (ref 0.3–1.2)
Total Protein: 6.7 g/dL (ref 6.5–8.1)

## 2021-09-07 LAB — CBC WITH DIFFERENTIAL/PLATELET
Abs Immature Granulocytes: 0.07 10*3/uL (ref 0.00–0.07)
Basophils Absolute: 0 10*3/uL (ref 0.0–0.1)
Basophils Relative: 0 %
Eosinophils Absolute: 0.1 10*3/uL (ref 0.0–0.5)
Eosinophils Relative: 1 %
HCT: 39.4 % (ref 39.0–52.0)
Hemoglobin: 12.8 g/dL — ABNORMAL LOW (ref 13.0–17.0)
Immature Granulocytes: 1 %
Lymphocytes Relative: 19 %
Lymphs Abs: 2.1 10*3/uL (ref 0.7–4.0)
MCH: 31.1 pg (ref 26.0–34.0)
MCHC: 32.5 g/dL (ref 30.0–36.0)
MCV: 95.9 fL (ref 80.0–100.0)
Monocytes Absolute: 1.1 10*3/uL — ABNORMAL HIGH (ref 0.1–1.0)
Monocytes Relative: 10 %
Neutro Abs: 7.4 10*3/uL (ref 1.7–7.7)
Neutrophils Relative %: 69 %
Platelets: 184 10*3/uL (ref 150–400)
RBC: 4.11 MIL/uL — ABNORMAL LOW (ref 4.22–5.81)
RDW: 14.4 % (ref 11.5–15.5)
WBC: 10.7 10*3/uL — ABNORMAL HIGH (ref 4.0–10.5)
nRBC: 0 % (ref 0.0–0.2)

## 2021-09-07 LAB — GLUCOSE, CAPILLARY
Glucose-Capillary: 229 mg/dL — ABNORMAL HIGH (ref 70–99)
Glucose-Capillary: 114 mg/dL — ABNORMAL HIGH (ref 70–99)
Glucose-Capillary: 142 mg/dL — ABNORMAL HIGH (ref 70–99)
Glucose-Capillary: 195 mg/dL — ABNORMAL HIGH (ref 70–99)

## 2021-09-07 LAB — CULTURE, BLOOD (ROUTINE X 2)
Culture: NO GROWTH
Culture: NO GROWTH
Special Requests: ADEQUATE
Special Requests: ADEQUATE

## 2021-09-07 NOTE — Progress Notes (Signed)
PROGRESS NOTE   Kevin Cardenas  EXB:[id number] DOB: [birth year] DOA: 09/01/2021 PCP: Dorna Mai, MD  Brief Narrative:  46 year old community dwelling black male VSD repair + pulmonary artery banding at age 69--NICM + [place] ICD 01/2015 with resulting HFrEF NYHA class II symptoms at follow-up 4/23 (last echo 3/20 = EF 25%)-follows chronically with AHF team-last OV apparently 20 pounds >usual OSA no CPAP-+ restrictive lung disease Recent varicose vein repair R LE  5/21: present MC ed chills dyspnea febrile 103 lactic acidosis procalcitonin 24 systolic BP 70 Admitted by CCM-started on pressors-septic shock secondary to staph cellulitis RLE 5/22: ID consulted/AHF team consulted 5/22: Off pressors 5/24:Transfer to [facility] --- TEE showed infected pacemaker lead 5/25: Blood culture to be repeated this p.m.--PPM explanted 5/27: Cultures persistently negative however discussed with Dr. Drucilla Schmidt of ID-he feels that patient should stay for at least 72 hours to ensure proper clearance of bacteremia and then only PICC line to be placed     Hospital-Problem based course  Sepsis 2/2 MSSA from PPM lead  Thrombocytopenia extraction per EP--slight leukocytosis today-would watch carefully Abx [Ancef currently--ordered as OPAT till 10/17/21] as per ID Follow 5/27b BC x2--needs 5 days clearance at minimum Has dental caries on tooth #2--will contact Dr. Benson Norway on 5/30 with regards to dental extraction Life-Vest to be delivered 5/26 VSD/Pulm banding HFrEF NYHA class II-III symptoms EF 25% NICM  [secondarily placed PPM due to NUICM?] Defer to cardiology: digoxin 0.125, Entresto [increase dose on d/c]-Aldactone 25 torsemide as per cardiology Coreg 3.125 twice daily, Farxiga 10 qd weight seems stable in the 106 to 108 kg range OSA not on CPAP with restrictive lung disease Continue Brovana Pulmicort DuoNeb-as an outpatient probably needs to titrate to a different device  DVT prophylaxis: SCD Code Status:  Full Family Communication: Discussed with wife at bedside Disposition:  Status is: Inpatient Remains inpatient appropriate because:    Consultants:    Procedures:   Antimicrobials:     Subjective:  Overall is better no distress no chest pain no fever no chills Asking to go home but discussed my conversation with infectious disease and we need to ensure proper clearance of bacteremia   Objective: Vitals:   09/07/21 0500 09/07/21 0556 09/07/21 0728 09/07/21 0811  BP:  104/71 101/67   Pulse:  90 86 88  Resp:  _0 Temp:  98.3 F (36.8 C) 98.3 F (36.8 C)   TempSrc:  Oral Oral   SpO2:  94% 96% 92%  Weight: 103.6 kg     Height:        Intake/Output Summary (Last 24 hours) at 09/07/2021 1146 Last data filed at 09/07/2021 0748 Gross per 24 hour  Intake 820 ml  Output 900 ml  Net -80 ml    Filed Weights   09/04/21 0705 09/05/21 1429 09/07/21 0500  Weight: 108 kg 106.6 kg 103.6 kg    Examination:  Pleasant no distress chest clear Wound covered with 4 x 4 over left side Abdomen soft Holosystolic murmur No lower extremity edema   Data Reviewed: personally reviewed   CBC    Component Value Date/Time   WBC 10.7 (H) 09/07/2021 0035   RBC 4.11 (L) 09/07/2021 0035   HGB 12.8 (L) 09/07/2021 0035   HGB 14.4 07/24/2021 0939   HCT 39.4 09/07/2021 0035   HCT 41.9 07/24/2021 0939   PLT 184 09/07/2021 0035   PLT 182 07/24/2021 0939   MCV 95.9 09/07/2021 0035   MCV 93 07/24/2021 0939  MCH 31.1 09/07/2021 0035   MCHC 32.5 09/07/2021 0035   RDW 14.4 09/07/2021 0035   RDW 12.6 07/24/2021 0939   LYMPHSABS 2.1 09/07/2021 0035   LYMPHSABS 1.0 04/17/2021 1047   MONOABS 1.1 (H) 09/07/2021 0035   EOSABS 0.1 09/07/2021 0035   EOSABS 0.1 04/17/2021 1047   BASOSABS 0.0 09/07/2021 0035   BASOSABS 0.0 04/17/2021 1047      Latest Ref Rng & Units 09/07/2021   12:35 AM 09/05/2021    2:08 AM 09/04/2021    2:58 AM  CMP  Glucose 70 - 99 mg/dL 161   109   115    BUN 6 -  20 mg/dL _0 Creatinine 0.61 - 1.24 mg/dL 1.01   0.94   0.95    Sodium 135 - 145 mmol/L 136   136   135    Potassium 3.5 - 5.1 mmol/L 3.4   3.6   3.9    Chloride 98 - 111 mmol/L 93   93   97    CO2 22 - 32 mmol/L 31   33   29    Calcium 8.9 - 10.3 mg/dL 8.9   8.4   8.6    Total Protein 6.5 - 8.1 g/dL 6.7      Total Bilirubin 0.3 - 1.2 mg/dL 0.5      Alkaline Phos 38 - 126 U/L 55      AST 15 - 41 U/L 105      ALT 0 - 44 U/L 92         Radiology Studies: [place] EKG SITE RITE  Result Date: 09/06/2021 If Site Rite image not attached, placement could not be confirmed due to current cardiac rhythm.  HYBRID OR IMAGING ([facility] ONLY)  Result Date: 09/05/2021 There is no interpretation for this exam.  This order is for images obtained during a surgical procedure.  Please See "Surgeries" Tab for more information regarding the procedure.     Scheduled Meds:  arformoterol  15 mcg Nebulization BID   budesonide (PULMICORT) nebulizer solution  0.25 mg Nebulization BID   carvedilol  3.125 mg Oral BID WC   Chlorhexidine Gluconate Cloth  6 each Topical Daily   dapagliflozin propanediol  10 mg Oral Daily   digoxin  0.125 mg Oral Daily   insulin aspart  0-9 Units Subcutaneous TID WC   mouth rinse  15 mL Mouth Rinse BID   potassium chloride  20 mEq Oral Daily   sacubitril-valsartan  1 tablet Oral BID   simvastatin  20 mg Oral QHS   spironolactone  25 mg Oral Daily   torsemide  20 mg Oral QPM   torsemide  40 mg Oral q morning   Continuous Infusions:  sodium chloride Stopped (09/04/21 0648)    ceFAZolin (ANCEF) IV 2 g (09/07/21 0612)     LOS: 6 days   Time spent: [place], MD [facility] To contact the attending provider between 7A-7P or the covering provider during after hours 7P-7A, please log into the web site [email or web address] and access using universal [place] password for that web site. If you do not have the password, please call the hospital  operator.  09/07/2021, 11:46 AM

## 2021-09-08 LAB — GLUCOSE, CAPILLARY
Glucose-Capillary: 128 mg/dL — ABNORMAL HIGH (ref 70–99)
Glucose-Capillary: 114 mg/dL — ABNORMAL HIGH (ref 70–99)
Glucose-Capillary: 123 mg/dL — ABNORMAL HIGH (ref 70–99)
Glucose-Capillary: 183 mg/dL — ABNORMAL HIGH (ref 70–99)

## 2021-09-08 MED ORDER — CARVEDILOL 3.125 MG PO TABS
3.1250 mg | ORAL_TABLET | Freq: Two times a day (BID) | ORAL | Status: DC
  Administered 2021-09-10: 3.125 mg via ORAL
  Filled 2021-09-08 (×2): qty 1

## 2021-09-08 NOTE — Progress Notes (Signed)
PROGRESS NOTE   Kevin Cardenas  SLP:[id number] DOB: 03/23/1976 DOA: 09/01/2021 PCP: Dorna Mai, MD  Brief Narrative:  46 year old community dwelling black male VSD repair + pulmonary artery banding at age 67--NICM + [place] ICD 01/2015 with resulting HFrEF NYHA class II symptoms at follow-up 4/23 (last echo 3/20 = EF 25%)-follows chronically with AHF team-last OV apparently 20 pounds >usual OSA no CPAP-+ restrictive lung disease Recent varicose vein repair R LE  5/21: present MC ed chills dyspnea febrile 103 lactic acidosis procalcitonin 24 systolic BP 70 Admitted by CCM-started on pressors-septic shock secondary to staph cellulitis RLE 5/22: ID consulted/AHF team consulted 5/22: Off pressors 5/24:Transfer to [facility] --- TEE showed infected pacemaker lead 5/25: Blood culture to be repeated this p.m.--PPM explanted 5/27: Cultures persistently negative however discussed with Dr. Drucilla Schmidt of ID-he feels that patient should stay for at least 72 hours to ensure proper clearance of bacteremia and then only PICC line to be placed     Hospital-Problem based course  Sepsis 2/2 MSSA from PPM lead  Thrombocytopenia extraction per EP--slight leukocytosis today-labs in am Abx [Ancef currently--ordered as OPAT till 10/17/21] as per ID Follow 5/27b BC x2--needs 5 days clearance at minimum Has dental caries on tooth #2--will contact Dr. Benson Norway on 5/30 with regards to dental extraction Life-Vest to be delivered 5/26 and ensure patient has it prior to d/c home VSD/Pulm banding HFrEF NYHA class II-III symptoms EF 25% NICM  [secondarily placed PPM due to NUICM?] Defer to cardiology: digoxin 0.125, Entresto [increase dose on d/c]-Aldactone 25 torsemide as per cardiology Coreg 3.125 twice daily, Farxiga 10 qd weight seems stable in the 106 to 108 kg range Mild transaminitis ? CVC liver vs Hepatic steatosis OP [place] abd pelv OSA not on CPAP with restrictive lung disease Continue Brovana Pulmicort  DuoNeb-as an outpatient probably needs to titrate to a different device  DVT prophylaxis: SCD Code Status: Full Family Communication: Discussed with wife at bedside Disposition:  Status is: Inpatient Remains inpatient appropriate because:    Consultants:    Procedures:   Antimicrobials:     Subjective:  Awake coherent in nad  Just finished meal No cp except over sight--sutures seem clean   Objective: Vitals:   09/08/21 0458 09/08/21 0500 09/08/21 0820 09/08/21 0824  BP: 113/75  112/78   Pulse: 86  86 99  Resp: _0 Temp: 97.7 F (36.5 C)  98 F (36.7 C)   TempSrc: Oral  Oral   SpO2: 97%  93% 97%  Weight:  104.2 kg    Height:        Intake/Output Summary (Last 24 hours) at 09/08/2021 1457 Last data filed at 09/08/2021 0815 Gross per 24 hour  Intake 760 ml  Output --  Net 760 ml    Filed Weights   09/05/21 1429 09/07/21 0500 09/08/21 0500  Weight: 106.6 kg 103.6 kg 104.2 kg    Examination:  Eomi ncat No ict no pallor no distress chest clear Wound covered with 4 x 4 over left side Abdomen soft Holosystolic murmur Has a bandage on R shin   Data Reviewed: personally reviewed   CBC    Component Value Date/Time   WBC 10.7 (H) 09/07/2021 0035   RBC 4.11 (L) 09/07/2021 0035   HGB 12.8 (L) 09/07/2021 0035   HGB 14.4 07/24/2021 0939   HCT 39.4 09/07/2021 0035   HCT 41.9 07/24/2021 0939   PLT 184 09/07/2021 0035   PLT 182 07/24/2021 0939  MCV 95.9 09/07/2021 0035   MCV 93 07/24/2021 0939   MCH 31.1 09/07/2021 0035   MCHC 32.5 09/07/2021 0035   RDW 14.4 09/07/2021 0035   RDW 12.6 07/24/2021 0939   LYMPHSABS 2.1 09/07/2021 0035   LYMPHSABS 1.0 04/17/2021 1047   MONOABS 1.1 (H) 09/07/2021 0035   EOSABS 0.1 09/07/2021 0035   EOSABS 0.1 04/17/2021 1047   BASOSABS 0.0 09/07/2021 0035   BASOSABS 0.0 04/17/2021 1047      Latest Ref Rng & Units 09/07/2021   12:35 AM 09/05/2021    2:08 AM 09/04/2021    2:58 AM  CMP  Glucose 70 - 99 mg/dL 161    109   115    BUN 6 - 20 mg/dL _0 Creatinine 0.61 - 1.24 mg/dL 1.01   0.94   0.95    Sodium 135 - 145 mmol/L 136   136   135    Potassium 3.5 - 5.1 mmol/L 3.4   3.6   3.9    Chloride 98 - 111 mmol/L 93   93   97    CO2 22 - 32 mmol/L 31   33   29    Calcium 8.9 - 10.3 mg/dL 8.9   8.4   8.6    Total Protein 6.5 - 8.1 g/dL 6.7      Total Bilirubin 0.3 - 1.2 mg/dL 0.5      Alkaline Phos 38 - 126 U/L 55      AST 15 - 41 U/L 105      ALT 0 - 44 U/L 92         Radiology Studies: No results found.   Scheduled Meds:  arformoterol  15 mcg Nebulization BID   budesonide (PULMICORT) nebulizer solution  0.25 mg Nebulization BID   carvedilol  3.125 mg Oral BID WC   Chlorhexidine Gluconate Cloth  6 each Topical Daily   dapagliflozin propanediol  10 mg Oral Daily   digoxin  0.125 mg Oral Daily   insulin aspart  0-9 Units Subcutaneous TID WC   mouth rinse  15 mL Mouth Rinse BID   potassium chloride  20 mEq Oral Daily   sacubitril-valsartan  1 tablet Oral BID   simvastatin  20 mg Oral QHS   spironolactone  25 mg Oral Daily   torsemide  20 mg Oral QPM   torsemide  40 mg Oral q morning   Continuous Infusions:  sodium chloride Stopped (09/04/21 0648)    ceFAZolin (ANCEF) IV 2 g (09/08/21 1325)     LOS: 7 days   Time spent: 53  Nita Sells, MD [facility] To contact the attending provider between 7A-7P or the covering provider during after hours 7P-7A, please log into the web site [email or web address] and access using universal [place] password for that web site. If you do not have the password, please call the hospital operator.  09/08/2021, 2:57 PM

## 2021-09-08 NOTE — Progress Notes (Signed)
Patient refused CPAP for tonight. RT instructed pt to have RT called if he changes his mind. RT will monitor as needed.

## 2021-09-08 NOTE — Progress Notes (Signed)
   09/08/21 2016  Vitals  Temp 98.7 F (37.1 C)  Temp Source Oral  BP 98/64  MAP (mmHg) 77  BP Location Left Arm  BP Method Automatic  Patient Position (if appropriate) Sitting  Pulse Rate 91  Pulse Rate Source Monitor  Resp 18  MEWS COLOR  MEWS Score Color Green  Oxygen Therapy  SpO2 95 %  O2 Device Room Air  MEWS Score  MEWS Temp 0  MEWS Systolic 1  MEWS Pulse 0  MEWS RR 0  MEWS LOC 0  MEWS Score 1   Patient's BP is 98/64 ans d has Entresto scheduled for tonight, J. Mansy,MD made aware and replied to hold this med at this time. Will continue to monitor patient.

## 2021-09-08 NOTE — Progress Notes (Signed)
Patient's BP is 95/78, Dr. Verlon Au made aware and will check on his medications, pt encouraged to ambulate more, will closely monitor.

## 2021-09-09 DIAGNOSIS — I5020 Unspecified systolic (congestive) heart failure: Secondary | ICD-10-CM

## 2021-09-09 LAB — COMPREHENSIVE METABOLIC PANEL
ALT: 56 U/L — ABNORMAL HIGH (ref 0–44)
AST: 53 U/L — ABNORMAL HIGH (ref 15–41)
Albumin: 3.4 g/dL — ABNORMAL LOW (ref 3.5–5.0)
Alkaline Phosphatase: 56 U/L (ref 38–126)
Anion gap: 11 (ref 5–15)
BUN: 26 mg/dL — ABNORMAL HIGH (ref 6–20)
CO2: 29 mmol/L (ref 22–32)
Calcium: 8.7 mg/dL — ABNORMAL LOW (ref 8.9–10.3)
Chloride: 91 mmol/L — ABNORMAL LOW (ref 98–111)
Creatinine, Ser: 1.01 mg/dL (ref 0.61–1.24)
GFR, Estimated: >60 mL/min (ref >60)
Glucose, Bld: 120 mg/dL — ABNORMAL HIGH (ref 70–99)
Potassium: 3.6 mmol/L (ref 3.5–5.1)
Sodium: 131 mmol/L — ABNORMAL LOW (ref 135–145)
Total Bilirubin: 0.5 mg/dL (ref 0.3–1.2)
Total Protein: 6.9 g/dL (ref 6.5–8.1)

## 2021-09-09 LAB — CBC WITH DIFFERENTIAL/PLATELET
Abs Immature Granulocytes: 0.2 10*3/uL — ABNORMAL HIGH (ref 0.00–0.07)
Basophils Absolute: 0 10*3/uL (ref 0.0–0.1)
Basophils Relative: 0 %
Eosinophils Absolute: 0.1 10*3/uL (ref 0.0–0.5)
Eosinophils Relative: 1 %
HCT: 40 % (ref 39.0–52.0)
Hemoglobin: 13.3 g/dL (ref 13.0–17.0)
Immature Granulocytes: 3 %
Lymphocytes Relative: 30 %
Lymphs Abs: 2 10*3/uL (ref 0.7–4.0)
MCH: 31.8 pg (ref 26.0–34.0)
MCHC: 33.3 g/dL (ref 30.0–36.0)
MCV: 95.7 fL (ref 80.0–100.0)
Monocytes Absolute: 0.8 10*3/uL (ref 0.1–1.0)
Monocytes Relative: 11 %
Neutro Abs: 3.8 10*3/uL (ref 1.7–7.7)
Neutrophils Relative %: 55 %
Platelets: 224 10*3/uL (ref 150–400)
RBC: 4.18 MIL/uL — ABNORMAL LOW (ref 4.22–5.81)
RDW: 14.5 % (ref 11.5–15.5)
WBC: 6.9 10*3/uL (ref 4.0–10.5)
nRBC: 0 % (ref 0.0–0.2)

## 2021-09-09 LAB — GLUCOSE, CAPILLARY
Glucose-Capillary: 214 mg/dL — ABNORMAL HIGH (ref 70–99)
Glucose-Capillary: 108 mg/dL — ABNORMAL HIGH (ref 70–99)
Glucose-Capillary: 136 mg/dL — ABNORMAL HIGH (ref 70–99)
Glucose-Capillary: 131 mg/dL — ABNORMAL HIGH (ref 70–99)

## 2021-09-09 LAB — MAGNESIUM: Magnesium: 2.4 mg/dL (ref 1.7–2.4)

## 2021-09-09 NOTE — Progress Notes (Signed)
Pt. Refused cpap for tonight. 

## 2021-09-09 NOTE — Progress Notes (Signed)
Progress Note  Patient Name: Kevin Cardenas Date of Encounter: 09/09/2021  Primary Cardiologist: [name], MD  Subjective   No shortness of breath or chest pain.  Stable appetite.  Mild soreness at ICD extraction site.  Inpatient Medications    Scheduled Meds:  arformoterol  15 mcg Nebulization BID   budesonide (PULMICORT) nebulizer solution  0.25 mg Nebulization BID   carvedilol  3.125 mg Oral BID WC   Chlorhexidine Gluconate Cloth  6 each Topical Daily   dapagliflozin propanediol  10 mg Oral Daily   digoxin  0.125 mg Oral Daily   insulin aspart  0-9 Units Subcutaneous TID WC   mouth rinse  15 mL Mouth Rinse BID   potassium chloride  20 mEq Oral Daily   sacubitril-valsartan  1 tablet Oral BID   simvastatin  20 mg Oral QHS   torsemide  40 mg Oral q morning   Continuous Infusions:  sodium chloride Stopped (09/04/21 0648)    ceFAZolin (ANCEF) IV 2 g (09/09/21 0445)   PRN Meds: acetaminophen, docusate sodium, ipratropium-albuterol, ondansetron (ZOFRAN) IV, polyethylene glycol   Vital Signs    Vitals:   09/08/21 2025 09/09/21 0435 09/09/21 0459 09/09/21 0752  BP:  117/62  121/75  Pulse:  88  92  Resp:  17  18  Temp:  97.9 F (36.6 C)  98.5 F (36.9 C)  TempSrc:  Oral  Oral  SpO2: 95% 98%  (!) 89%  Weight:   105 kg   Height:        Intake/Output Summary (Last 24 hours) at 09/09/2021 0755 Last data filed at 09/08/2021 0815 Gross per 24 hour  Intake 180 ml  Output --  Net 180 ml   Filed Weights   09/07/21 0500 09/08/21 0500 09/09/21 0459  Weight: 103.6 kg 104.2 kg 105 kg    Telemetry    Sinus rhythm.  Personally reviewed.  ECG    No ECG reviewed.  Physical Exam   GEN: No acute distress.   Neck: No JVD. Cardiac: RRR, 2-3 systolic murmur,no gallop.  Thorax: ICD extraction site with dressing C/D/I. Respiratory: Nonlabored. Clear to auscultation bilaterally. GI: Soft, nontender, bowel sounds present. MS: No edema; No deformity. Neuro:   Nonfocal. Psych: Alert and oriented x 3. Normal affect.  Labs    Chemistry Recent Labs  Lab 09/05/21 0208 09/07/21 0035 09/09/21 0024  NA 136 136 131*  K 3.6 3.4* 3.6  CL 93* 93* 91*  CO2 33* 31 29  GLUCOSE 109* 161* 120*  BUN 15 23* 26*  CREATININE 0.94 1.01 1.01  CALCIUM 8.4* 8.9 8.7*  PROT  --  6.7 6.9  ALBUMIN  --  3.3* 3.4*  AST  --  105* 53*  ALT  --  92* 56*  ALKPHOS  --  55 56  BILITOT  --  0.5 0.5  GFRNONAA >60 >60 >60  ANIONGAP _0 Hematology Recent Labs  Lab 09/05/21 0208 09/07/21 0035 09/09/21 0024  WBC 4.7 10.7* 6.9  RBC 4.16* 4.11* 4.18*  HGB 13.0 12.8* 13.3  HCT 40.2 39.4 40.0  MCV 96.6 95.9 95.7  MCH 31.3 31.1 31.8  MCHC 32.3 32.5 33.3  RDW 14.5 14.4 14.5  PLT 106* 184 224    Cardiac Enzymes Recent Labs  Lab 09/01/21 1423 09/01/21 1713  TROPONINIHS 6 8    Radiology    No results found.  Cardiac Studies   TEE 09/04/2021:  1. No residual VSD noted.  Left ventricular ejection fraction, by  estimation, is 35 to 40%. The left ventricle has moderately decreased  function. The left ventricle demonstrates global hypokinesis. There is  mild left ventricular hypertrophy.   2. There is a 0.5 x 0.9 cm vegetation attached to the ICD lead as it  passes through the right atrium. Right ventricular systolic function is  moderately reduced. The right ventricular size is mildly enlarged.   3. Left atrial size was mildly dilated. No left atrial/left atrial  appendage thrombus was detected.   4. Right atrial size was mildly dilated.   5. No MV vegetation. The mitral valve is normal in structure. No evidence  of mitral valve regurgitation. No evidence of mitral stenosis.   6. Peak RV-RA gradient 17 mmHg. No TV vegetation.   7. No aortic valve vegetation. The aortic valve is tricuspid. Aortic  valve regurgitation is not visualized. No aortic stenosis is present.   8. Peak pulmonic valve gradient 13 mmHg. No PV vegetation. Mild pulmonic   stenosis.   9. PFO by color doppler.   Assessment & Plan    1.  MSSA bacteremia in the setting of right lower extremity cellulitis and also vegetation on [employer] ICD lead status post device extraction on May 25.  Continues on cefazolin with plan for PICC line.  He is afebrile.  2.  HFrEF with nonischemic cardiomyopathy.  LVEF approximately 35% and also moderate RV dysfunction.  Remains on GDMT although Entresto dose held yesterday due to systolics in the 15B.  Otherwise clinically stable in terms of volume status.  3.  History of VSD, no residual defect by TEE.  4.  Mild pulmonic stenosis.  Mean gradient 13 mmHg.  5.  OSA on CPAP.  Continue Coreg, Lanoxin, Farxiga, Demadex with potassium supplement, and Entresto.  Would not advance Entresto dose for discharge, this can be done as an outpatient depending on blood pressure trend.  No changes made in current regimen.  Place PICC line.  LifeVest ICD planned for discharge as well per EP.  Signed, Rozann Lesches, MD  09/09/2021, 7:55 AM

## 2021-09-09 NOTE — Progress Notes (Signed)
09/09/21 2037  Vitals  Temp 98.4 F (36.9 C)  Temp Source Oral  BP 92/69  MAP (mmHg) 77  BP Method Automatic  Pulse Rate 100  Pulse Rate Source Monitor  MEWS COLOR  MEWS Score Color Green  Oxygen Therapy  SpO2 96 %  MEWS Score  MEWS Temp 0  MEWS Systolic 1  MEWS Pulse 0  MEWS RR 0  MEWS LOC 0  MEWS Score 1   Bp 96/69. On call J. Daniels,NP made aware. Advised to hold Coreg and Entresto at this time.   Will continue to close monitor patient

## 2021-09-09 NOTE — Progress Notes (Signed)
PROGRESS NOTE   NOBLE CICALESE  JTT:[id number] DOB: [birth year] DOA: 09/01/2021 PCP: Dorna Mai, MD  Brief Narrative:   46 year old community dwelling black male VSD repair + pulmonary artery banding at age 67--NICM + Hospers ICD 01/2015 with resulting HFrEF NYHA class II symptoms at follow-up 4/23 (last echo 3/20 = EF 25%)-follows chronically with AHF team-last OV apparently 20 pounds >usual OSA no CPAP-+ restrictive lung disease Recent varicose vein repair R LE  5/21: present MC ed chills dyspnea febrile 103 lactic acidosis procalcitonin 24 systolic BP 70 Admitted by CCM-started on pressors-septic shock secondary to staph cellulitis RLE 5/22: ID consulted/AHF team consulted 5/22: Off pressors 5/24:Transfer to [facility] [place] --- TEE showed infected pacemaker lead 5/25: Blood culture to be repeated this p.m.--PPM explanted 5/27: Cultures persistently negative however discussed with Dr. Drucilla Schmidt of ID-he feels that patient should stay for at least 72 hours to ensure proper clearance of bacteremia and then only PICC line to be placed 5/29: PICC to be placed   Hospital-Problem based course  Sepsis 2/2 MSSA from PPM lead  Thrombocytopenia extraction per EP--Abx [Ancef currently--ordered as OPAT till 10/17/21] as per ID Follow 5/27 BC x 2--needs 5 days clearance at minimum--place PICC am 5/29 Has dental caries on tooth #2--will contact Dr. Benson Norway on 5/30 with regards to dental extraction Life-Vest to be delivered 5/26 and ensure patient has it prior to d/c home VSD/Pulm banding HFrEF NYHA class II-III symptoms EF 25% NICM  [secondarily placed PPM due to NUICM?] Defer to cardiology: digoxin 0.125, Entresto 1 tab bid on d/c Coreg 3.125 twice daily, Farxiga 10 qd Aldactone 25 held torsemide 40 daily weight seems stable in the 106 to 108 kg range Mild transaminitis ? CVC liver vs Hepatic steatosis OP [place] abd pelv OSA not on CPAP with restrictive lung disease Continue Brovana Pulmicort DuoNeb-as  an outpatient probably needs to titrate to a different device  DVT prophylaxis: SCD Code Status: Full Family Communication: Discussed with wife at bedside Disposition:  Status is: Inpatient Remains inpatient appropriate because:    Consultants:    Procedures:   Antimicrobials:     Subjective:  Awake no issues no pain   Objective: Vitals:   09/09/21 0459 09/09/21 0752 09/09/21 0829 09/09/21 1634  BP:  121/75  104/75  Pulse:  92 82 95  Resp:  _0 Temp:  98.5 F (36.9 C)  98.2 F (36.8 C)  TempSrc:  Oral  Oral  SpO2:  (!) 89% 93% 97%  Weight: 105 kg     Height:        Intake/Output Summary (Last 24 hours) at 09/09/2021 1757 Last data filed at 09/09/2021 1750 Gross per 24 hour  Intake 480 ml  Output --  Net 480 ml    Filed Weights   09/07/21 0500 09/08/21 0500 09/09/21 0459  Weight: 103.6 kg 104.2 kg 105 kg    Examination:  Eomi ncat No ict no pallor Holosystolic murmur s1 s2 no m/r/g no distress chest clear Wound with 4 x 4 over left side Abdomen soft Has a bandage on R shin   Data Reviewed: personally reviewed   CBC    Component Value Date/Time   WBC 6.9 09/09/2021 0024   RBC 4.18 (L) 09/09/2021 0024   HGB 13.3 09/09/2021 0024   HGB 14.4 07/24/2021 0939   HCT 40.0 09/09/2021 0024   HCT 41.9 07/24/2021 0939   PLT 224 09/09/2021 0024   PLT 182 07/24/2021 0939   MCV 95.7 09/09/2021  0024   MCV 93 07/24/2021 0939   MCH 31.8 09/09/2021 0024   MCHC 33.3 09/09/2021 0024   RDW 14.5 09/09/2021 0024   RDW 12.6 07/24/2021 0939   LYMPHSABS 2.0 09/09/2021 0024   LYMPHSABS 1.0 04/17/2021 1047   MONOABS 0.8 09/09/2021 0024   EOSABS 0.1 09/09/2021 0024   EOSABS 0.1 04/17/2021 1047   BASOSABS 0.0 09/09/2021 0024   BASOSABS 0.0 04/17/2021 1047      Latest Ref Rng & Units 09/09/2021   12:24 AM 09/07/2021   12:35 AM 09/05/2021    2:08 AM  CMP  Glucose 70 - 99 mg/dL 120   161   109    BUN 6 - 20 mg/dL _0 Creatinine 0.61 - 1.24  mg/dL 1.01   1.01   0.94    Sodium 135 - 145 mmol/L 131   136   136    Potassium 3.5 - 5.1 mmol/L 3.6   3.4   3.6    Chloride 98 - 111 mmol/L 91   93   93    CO2 22 - 32 mmol/L 29   31   33    Calcium 8.9 - 10.3 mg/dL 8.7   8.9   8.4    Total Protein 6.5 - 8.1 g/dL 6.9   6.7     Total Bilirubin 0.3 - 1.2 mg/dL 0.5   0.5     Alkaline Phos 38 - 126 U/L 56   55     AST 15 - 41 U/L 53   105     ALT 0 - 44 U/L 56   92        Radiology Studies: No results found.   Scheduled Meds:  arformoterol  15 mcg Nebulization BID   budesonide (PULMICORT) nebulizer solution  0.25 mg Nebulization BID   carvedilol  3.125 mg Oral BID WC   Chlorhexidine Gluconate Cloth  6 each Topical Daily   dapagliflozin propanediol  10 mg Oral Daily   digoxin  0.125 mg Oral Daily   insulin aspart  0-9 Units Subcutaneous TID WC   mouth rinse  15 mL Mouth Rinse BID   potassium chloride  20 mEq Oral Daily   sacubitril-valsartan  1 tablet Oral BID   simvastatin  20 mg Oral QHS   torsemide  40 mg Oral q morning   Continuous Infusions:  sodium chloride Stopped (09/04/21 0648)    ceFAZolin (ANCEF) IV 2 g (09/09/21 1313)     LOS: 8 days   Time spent: [place], MD [facility] To contact the attending provider between 7A-7P or the covering provider during after hours 7P-7A, please log into the web site [email or web address] and access using universal [place] password for that web site. If you do not have the password, please call the hospital operator.  09/09/2021, 5:57 PM

## 2021-09-09 NOTE — Progress Notes (Signed)
Mobility Specialist Progress Note:   09/09/21 1145  Mobility  Activity Ambulated independently in hallway  Level of Assistance Modified independent, requires aide device or extra time  Assistive Device Other (Comment) (IV Pole)  Distance Ambulated (ft) 550 ft  Activity Response Tolerated well  $Mobility charge 1 Mobility   Pt eager for mobility session. Ambulated at [place] level. Back in room with all needs met.   Nelta Numbers Acute Rehab Secure Chat or Office Phone: [phone]

## 2021-09-09 NOTE — Progress Notes (Signed)
Pt has the Life Vest at bedside.

## 2021-09-09 NOTE — Progress Notes (Signed)
Mobility Specialist Progress Note:   09/09/21 1625  Mobility  Activity Ambulated independently in hallway  Level of Assistance Independent  Assistive Device None  Distance Ambulated (ft) 1100 ft  Activity Response Tolerated well  $Mobility charge 1 Mobility   Pt eager for second mobility session. Ambulated independently in hallway. Pt back in room with all needs met.   Nelta Numbers Acute Rehab Secure Chat or Office Phone: [phone]

## 2021-09-10 ENCOUNTER — Other Ambulatory Visit (HOSPITAL_COMMUNITY): Payer: Self-pay

## 2021-09-10 DIAGNOSIS — I5023 Acute on chronic systolic (congestive) heart failure: Secondary | ICD-10-CM

## 2021-09-10 LAB — COMPREHENSIVE METABOLIC PANEL
ALT: 55 U/L — ABNORMAL HIGH (ref 0–44)
AST: 58 U/L — ABNORMAL HIGH (ref 15–41)
Albumin: 3.4 g/dL — ABNORMAL LOW (ref 3.5–5.0)
Alkaline Phosphatase: 51 U/L (ref 38–126)
Anion gap: 9 (ref 5–15)
BUN: 25 mg/dL — ABNORMAL HIGH (ref 6–20)
CO2: 28 mmol/L (ref 22–32)
Calcium: 8.7 mg/dL — ABNORMAL LOW (ref 8.9–10.3)
Chloride: 96 mmol/L — ABNORMAL LOW (ref 98–111)
Creatinine, Ser: 1.12 mg/dL (ref 0.61–1.24)
GFR, Estimated: >60 mL/min (ref >60)
Glucose, Bld: 126 mg/dL — ABNORMAL HIGH (ref 70–99)
Potassium: 3.9 mmol/L (ref 3.5–5.1)
Sodium: 133 mmol/L — ABNORMAL LOW (ref 135–145)
Total Bilirubin: 0.4 mg/dL (ref 0.3–1.2)
Total Protein: 6.8 g/dL (ref 6.5–8.1)

## 2021-09-10 LAB — GLUCOSE, CAPILLARY
Glucose-Capillary: 123 mg/dL — ABNORMAL HIGH (ref 70–99)
Glucose-Capillary: 104 mg/dL — ABNORMAL HIGH (ref 70–99)

## 2021-09-10 LAB — CULTURE, BLOOD (ROUTINE X 2)
Culture: NO GROWTH
Culture: NO GROWTH

## 2021-09-10 MED ORDER — SODIUM CHLORIDE 0.9% FLUSH: 10.0000 mL | INTRAVENOUS | Status: DC | PRN

## 2021-09-10 MED ORDER — TORSEMIDE 20 MG PO TABS
20.0000 mg | ORAL_TABLET | Freq: Two times a day (BID) | ORAL | Status: DC
Start: 2021-09-10 — End: 2021-09-10

## 2021-09-10 MED ORDER — SODIUM CHLORIDE 0.9% FLUSH
10.0000 mL | Freq: Two times a day (BID) | INTRAVENOUS | Status: DC
  Administered 2021-09-10: 10 mL

## 2021-09-10 MED ORDER — TORSEMIDE 20 MG PO TABS
20.0000 mg | ORAL_TABLET | Freq: Two times a day (BID) | ORAL | 1 refills | Status: DC
  Filled 2021-09-10 – 2021-11-04 (×2): qty 60, 30d supply, fill #0

## 2021-09-10 MED ORDER — HEPARIN SOD (PORK) LOCK FLUSH 100 UNIT/ML IV SOLN
250.0000 [IU] | INTRAVENOUS | Status: AC | PRN
  Administered 2021-09-10: 250 [IU]
  Filled 2021-09-10: qty 2.5

## 2021-09-10 MED ORDER — SPIRONOLACTONE 25 MG PO TABS
25.0000 mg | ORAL_TABLET | Freq: Every day | ORAL | Status: DC
  Administered 2021-09-10: 25 mg via ORAL
  Filled 2021-09-10: qty 1

## 2021-09-10 NOTE — Progress Notes (Signed)
Peripherally Inserted Central Catheter Placement  The IV Nurse has discussed with the patient and/or persons authorized to consent for the patient, the purpose of this procedure and the potential benefits and risks involved with this procedure.  The benefits include less needle sticks, lab draws from the catheter, and the patient may be discharged home with the catheter. Risks include, but not limited to, infection, bleeding, blood clot (thrombus formation), and puncture of an artery; nerve damage and irregular heartbeat and possibility to perform a PICC exchange if needed/ordered by physician.  Alternatives to this procedure were also discussed.  Bard Power PICC patient education guide, fact sheet on infection prevention and patient information card has been provided to patient /or left at bedside.    PICC Placement Documentation  PICC Single Lumen 09/10/21 Right Brachial 39 cm 0 cm (Active)  Indication for Insertion or Continuance of Line Home intravenous therapies (PICC only) 09/10/21 0847  Exposed Catheter (cm) 0 cm 09/10/21 0847  Site Assessment Clean, Dry, Intact 09/10/21 0847  Line Status Flushed;Blood return noted;Saline locked 09/10/21 0847  Dressing Type Transparent 09/10/21 0847  Dressing Status Antimicrobial disc in place 09/10/21 0847  Dressing Change Due 09/17/21 09/10/21 0847       Scotty Court 09/10/2021, 8:49 AM

## 2021-09-10 NOTE — Discharge Summary (Signed)
Physician Discharge Summary  [name]:[id number] DOB: 07-21-1975 DOA: 09/01/2021  PCP: Dorna Mai, MD  Admit date: 09/01/2021 Discharge date: 09/10/2021  Time spent: 36 minutes  Recommendations for Outpatient Follow-up:  Chem-12 CBC magnesium in the outpatient setting Digoxin level as per cardiology service recommendation Continue antibiotics as per ID and will complete the same on 10/17/2021 and home health therapies to be ordered on discharge to assist with this Please refer the patient to a dentist-requires removal of tooth #2  Discharge Diagnoses:  MAIN problem for hospitalization   Septic shock secondary to MSSA  Please see below for itemized issues addressed in [name]- refer to other progress notes for clarity if needed  Discharge Condition: Improved  Diet recommendation: Heart healthy  Filed Weights   09/07/21 0500 09/08/21 0500 09/09/21 0459  Weight: 103.6 kg 104.2 kg 105 kg    History of present illness:  46 year old community dwelling black male VSD repair + pulmonary artery banding at age 75--NICM + [place] ICD 01/2015 with resulting HFrEF NYHA class II symptoms at follow-up 4/23 (last echo 3/20 = EF 25%)-follows chronically with AHF team-last OV apparently 20 pounds >usual OSA no CPAP-+ restrictive lung disease Recent varicose vein repair R LE   5/21: present [facility] ed chills dyspnea febrile 103 lactic acidosis procalcitonin 24 systolic BP 70 Admitted by CCM-started on pressors-septic shock secondary to staph cellulitis RLE 5/22: ID consulted/AHF team consulted 5/22: Off pressors 5/24:Transfer to [facility] --- TEE showed infected pacemaker lead 5/25: Blood culture to be repeated this p.m.--PPM explanted 5/27: Cultures persistently negative however discussed with Dr. Drucilla Schmidt of ID-he feels that patient should stay for at least 72 hours to ensure proper clearance of bacteremia and then only PICC line to be placed 5/30: PICC placed  Hospital Course:  Sepsis  2/2 MSSA from PPM lead  Thrombocytopenia extraction per EP--Abx [Ancef currently--ordered as OPAT till 10/17/21] as per ID Follow 5/27 BC x 2--needs 5 days clearance at minimum--place PICC am 5/29 Has dental caries on tooth #2--patient will need dental referral as OP Life-Vest to be delivered 5/26 and ensure patient has it prior to d/c home VSD/Pulm banding HFrEF NYHA class II-III symptoms EF 25% NICM  [secondarily placed PPM due to NUICM?] Defer to cardiology:  digoxin 0.125,  Entresto 1 tab bid on d/c Coreg 3.125 twice daily,  Farxiga 10 qd Aldactone 25 resumed on d/c torsemide 20 bd weight seems stable in the 105 KG range Mild transaminitis ? CVC liver vs Hepatic steatosis OP [place] abd pelv OSA not on CPAP with restrictive lung disease Continue Brovana Pulmicort DuoNeb   Discharge Exam: Vitals:   09/10/21 0735 09/10/21 0742  BP: 121/85   Pulse: 89 84  Resp: 18 17  Temp: 98.4 F (36.9 C)   SpO2: 97% 96%    Subj on day of d/c   Pleasant coherent cheerful no distress   General Exam on discharge  EOMI NCAT no focal deficit poor dentition noted No JVD Abdomen soft no rebound distended obese but cannot appreciate HSM No lower extremity edema patient has a wound on the right shin Chest is clear no rales rhonchi wheeze  Discharge Instructions   Discharge Instructions     [facility] pharmacist to adjust dose for Vancomycin, Aminoglycosides and other anti-infective therapies as requested by physician.   Complete by: As directed    [facility] to provide Cath Flo 63m   Complete by: As directed    Administer for PICC line occlusion  and as ordered by physician for other access device issues.   Anaphylaxis Kit: Provided to treat any anaphylactic reaction to the medication being provided to the patient if First Dose or when requested by physician   Complete by: As directed    Epinephrine 26m/ml vial / amp: Administer 0.378m(0.41m66msubcutaneously once  for moderate to severe anaphylaxis, nurse to call physician and pharmacy when reaction occurs and call 911 if needed for immediate care   Diphenhydramine 55m6m IV vial: Administer 25-55mg57mIM PRN for first dose reaction, rash, itching, mild reaction, nurse to call physician and pharmacy when reaction occurs   Sodium Chloride 0.9% NS 500ml 90mAdminister if needed for hypovolemic blood pressure drop or as ordered by physician after call to physician with anaphylactic reaction   Change dressing on IV access line weekly and PRN   Complete by: As directed    Flush IV access with Sodium Chloride 0.9% and Heparin 10 units/ml or 100 units/ml   Complete by: As directed    Home infusion instructions - [facility]   Complete by: As directed    Instructions: Flush IV access with Sodium Chloride 0.9% and Heparin 10units/ml or 100units/ml   Change dressing on IV access line: Weekly and PRN   Instructions Cath Flo 2mg: A641mnister for PICC Line occlusion and as ordered by physician for other access device   [facility] pharmacist to adjust dose for: Vancomycin, Aminoglycosides and other anti-infective therapies as requested by physician   Method of administration may be changed at the discretion of home infusion pharmacist based upon assessment of the patient and/or caregiver's ability to self-administer the medication ordered   Complete by: As directed       Allergies as of 09/10/2021       Reactions   Metolazone Other (See Comments)   "Dried up my kidneys- took only one tablet"        Medication List     STOP taking these medications    aspirin 81 MG chewable tablet       TAKE these medications    ascorbic acid 500 MG tablet Commonly known as: VITAMIN C Take 500 mg by mouth daily.   Breo Ellipta 100-25 MCG/ACT Aepb Generic drug: fluticasone furoate-vilanterol Inhale 1 puff into the lungs daily.   carvedilol 3.125 MG tablet Commonly known as: COREG Take 1  tablet (3.125 mg total) by mouth 2 (two) times daily with a meal.   ceFAZolin  IVPB Commonly known as: ANCEF Inject 2 g into the vein every 8 (eight) hours. Indication:  Bacteremia First Dose: Yes Last Day of Therapy:  10/17/2021 Labs - Once weekly:  CBC/D and BMP, Labs - Every other week:  ESR and CRP Method of administration: IV Push Method of administration may be changed at the discretion of home infusion pharmacist based upon assessment of the patient and/or caregiver's ability to self-administer the medication ordered.   digoxin 0.125 MG tablet Commonly known as: LANOXIN Take 1 tablet (125 mcg total) by mouth daily.   Entresto 49-51 MG Generic drug: sacubitril-valsartan Take 1 tablet by mouth 2 (two) times daily.   Farxiga 10 MG Tabs tablet Generic drug: dapagliflozin propanediol Take 1 tablet (10 mg total) by mouth daily.   potassium chloride SA 20 MEQ tablet Commonly known as: KLOR-CON M Take 1 tablet (20 mEq total) by mouth daily.   simvastatin 20 MG tablet Commonly known as: ZOCOR Take 1 tablet (20 mg total) by mouth at bedtime.  spironolactone 25 MG tablet Commonly known as: ALDACTONE Take 1 tablet (25 mg total) by mouth daily.   torsemide 20 MG tablet Commonly known as: DEMADEX Take 1 tablet (20 mg total) by mouth 2 (two) times daily. What changed: when to take this               Durable Medical Equipment  (From admission, onward)           Start     Ordered   09/06/21 0959  For home use only DME Vest life vest  Once       Comments: NICM Bacteremia ICD extracted 75moduration   09/06/21 0959              Discharge Care Instructions  (From admission, onward)           Start     Ordered   09/06/21 0000  Change dressing on IV access line weekly and PRN  (Home infusion instructions - [facility] )        09/06/21 1313           Allergies  Allergen Reactions   Metolazone Other (See Comments)    "Dried up my  kidneys- took only one tablet"    Follow-up Information     [facility] [place]Follow up [place] 09/[place]/2021.   Specialty: Cardiology Why: [facility] 3:30 pm Entrance C, Free VPhysicist, medicalinformation: [address][phone]       [facility] Follow up.   Specialty: Cardiology Why: 09/18/21 @ 4:00PM, wound check, suture removal Contact information: [address] [place] [facility]ollow up.   Specialty: Home Health Services Contact information: [address] [place] [phone]        [facility] Follow up.   Why: [phone]                 The results of significant diagnostics from this hospitalization (including imaging, microbiology, ancillary and laboratory) are listed below for reference.    Significant Diagnostic Studies: DG CHEST PORT 1 VIEW  Result Date: 09/02/2021 CLINICAL DATA:  Shortness of breath. EXAM: PORTABLE CHEST 1 VIEW COMPARISON:  Portable chest yesterday at 1:36 p.m. FINDINGS: Left single lead cardiac assist device and right ventricular wire insertion are stable. There are median sternotomy sutures. There is mild cardiomegaly with normal caliber central vessels. The lungs are mildly emphysematous but clear. The sulci are sharp. There is a stable mediastinal configuration. Compared with yesterday's film the vascular prominence centrally at the time is no longer seen. In all other respects there are no further changes. Thoracic spondylosis. IMPRESSION: No evidence of acute chest process. Mild cardiomegaly. Central vascular prominence noted yesterday is no longer seen. COPD. Electronically Signed   By: KTelford NabM.D.   On: 09/02/2021 07:26   DG Chest Port 1 View  Result Date: 09/01/2021 CLINICAL DATA:   Dyspnea EXAM: PORTABLE CHEST 1 VIEW COMPARISON:  06/20/2021 FINDINGS: Previously seen PICC line has been removed. Left-sided cardiac device remains in place. Stable cardiomegaly. Mild pulmonary vascular congestion. No focal airspace consolidation, pleural effusion, or pneumothorax. IMPRESSION: Cardiomegaly with mild pulmonary vascular congestion. Electronically Signed   By: NDavina PokeD.O.   On: 09/01/2021 14:15   ECHOCARDIOGRAM COMPLETE  Result Date: 09/02/2021    ECHOCARDIOGRAM REPORT   Patient Name:   Kevin Cardenas Date of Exam: 09/02/2021 Medical Rec #:  [id number]       Height:       66.0 in Accession #:    [id number]      Weight:       239.9 lb Date of Birth:  02/18/1976       BSA:          2.160 m Patient Age:    22 years        BP:           115/74 mmHg Patient Gender: M               HR:           93 bpm. Exam Location:  Inpatient Procedure: 2D Echo, Cardiac Doppler, Color Doppler and Intracardiac            Opacification Agent Indications:     Congestive heart failure  History:         Patient has prior history of Echocardiogram examinations, most                  recent 06/20/2021. Pacemaker and Prior CABG; Risk Factors:Former                  Smoker.  Sonographer:     Joette Catching RCS Referring Phys:  [id number] GRACE E BOWSER Diagnosing Phys: Gwyndolyn Kaufman MD  Sonographer Comments: Patient is morbidly obese and Technically difficult study due to poor echo windows. Image acquisition challenging due to patient body habitus. IMPRESSIONS  1. Left ventricular ejection fraction, by estimation, is 35%. The left ventricle has moderately decreased function. The left ventricle demonstrates global hypokinesis. Left ventricular diastolic parameters are indeterminate. No residual VSD visualized by color doppler.  2. Right ventricular systolic function is normal. The right ventricular size is moderately enlarged. There is mildly elevated pulmonary artery systolic pressure. The estimated right ventricular  systolic pressure is 33.3 mmHg.  3. The mitral valve is normal in structure. Trivial mitral valve regurgitation.  4. The aortic valve is tricuspid. Aortic valve regurgitation is not visualized.  5. The inferior vena cava is dilated in size with >50% respiratory variability, suggesting right atrial pressure of 8 mmHg.  6. No significant pulmonic stenosis. Peak gradient 50mHg. Comparison(s): Compared to prior TTE in 06/2021, there is no significant change. FINDINGS  Left Ventricle: No residual VSD visualized. Left ventricular ejection fraction, by estimation, is 35%. The left ventricle has severely decreased function. The left ventricle demonstrates global hypokinesis. Definity contrast agent was given IV to delineate the left ventricular endocardial borders. The left ventricular internal cavity size was normal in size. There is no left ventricular hypertrophy. Abnormal (paradoxical) septal motion consistent with post-operative status. Left ventricular diastolic parameters are indeterminate. Right Ventricle: The right ventricular size is moderately enlarged. No increase in right ventricular wall thickness. Right ventricular systolic function is normal. There is mildly elevated pulmonary artery systolic pressure. The tricuspid regurgitant velocity is 2.65 m/s, and with an assumed right atrial pressure of 8 mmHg, the estimated right ventricular systolic pressure is 354.5mmHg. Left Atrium: Left atrial size was normal in size. Right Atrium: Right atrial size was normal in size. Pericardium: There is no evidence of pericardial effusion. Mitral Valve: The mitral valve is normal in structure. Trivial mitral valve regurgitation. Tricuspid Valve: The tricuspid valve is normal in structure. Tricuspid valve regurgitation is trivial.  Aortic Valve: The aortic valve is tricuspid. Aortic valve regurgitation is not visualized. Aortic valve mean gradient measures 5.0 mmHg. Aortic valve peak gradient measures 7.3 mmHg. Aortic valve  area, by VTI measures 4.05 cm. Pulmonic Valve: The pulmonic valve was normal in structure. Pulmonic valve regurgitation is trivial. Aorta: The aortic root and ascending aorta are structurally normal, with no evidence of dilitation. Pulmonary Artery: No significant pulmonic stenosis. Peak gradient 13mHg. Venous: The inferior vena cava is dilated in size with greater than 50% respiratory variability, suggesting right atrial pressure of 8 mmHg. IAS/Shunts: The atrial septum is grossly normal. Additional Comments: A device lead is visualized.  LEFT VENTRICLE PLAX 2D LVIDd:         5.35 cm      Diastology LVIDs:         3.75 cm      LV e' medial:    7.62 cm/s LV PW:         1.10 cm      LV E/e' medial:  17.7 LV IVS:        1.30 cm      LV e' lateral:   11.00 cm/s LVOT diam:     2.50 cm      LV E/e' lateral: 12.3 LV SV:         91 LV SV Index:   42 LVOT Area:     4.91 cm  LV Volumes (MOD) LV vol d, MOD A2C: 145.0 ml LV vol d, MOD A4C: 104.0 ml LV vol s, MOD A2C: 65.7 ml LV vol s, MOD A4C: 41.3 ml LV SV MOD A2C:     79.3 ml LV SV MOD A4C:     104.0 ml LV SV MOD BP:      72.0 ml RIGHT VENTRICLE RV Basal diam:  5.50 cm RV Mid diam:    4.40 cm TAPSE (M-mode): 1.7 cm LEFT ATRIUM           Index        RIGHT ATRIUM           Index LA diam:      2.60 cm 1.20 cm/m   RA Area:     16.30 cm LA Vol (A2C): 30.2 ml 13.98 ml/m  RA Volume:   38.00 ml  17.59 ml/m LA Vol (A4C): 31.8 ml 14.72 ml/m  AORTIC VALVE                     PULMONIC VALVE AV Area (Vmax):    3.82 cm      PV Vmax:       2.10 m/s AV Area (Vmean):   3.69 cm      PV Peak grad:  17.6 mmHg AV Area (VTI):     4.05 cm AV Vmax:           135.00 cm/s AV Vmean:          104.000 cm/s AV VTI:            0.224 m AV Peak Grad:      7.3 mmHg AV Mean Grad:      5.0 mmHg LVOT Vmax:         105.00 cm/s LVOT Vmean:        78.200 cm/s LVOT VTI:          0.185 m LVOT/AV VTI ratio: 0.83  AORTA Ao Root diam: 2.80 cm Ao Asc diam:  3.50 cm MITRAL VALVE  TRICUSPID VALVE  MV Area (PHT): 9.25 cm     TR Peak grad:   28.1 mmHg MV Decel Time: 82 msec      TR Vmax:        265.00 cm/s MV E velocity: 135.00 cm/s MV A velocity: 132.00 cm/s  SHUNTS MV E/A ratio:  1.02         Systemic VTI:  0.18 m                             Systemic Diam: 2.50 cm Gwyndolyn Kaufman MD Electronically signed by Gwyndolyn Kaufman MD Signature Date/Time: 09/02/2021/11:02:10 AM    Final (Updated)    ECHO TEE  Result Date: 09/04/2021    TRANSESOPHOGEAL ECHO REPORT   Patient Name:   Kevin Cardenas Date of Exam: 09/04/2021 Medical Rec #:  [id number]       Height:       66.0 in Accession #:    [id number]      Weight:       238.1 lb Date of Birth:  1975/09/01       BSA:          2.154 m Patient Age:    4 years        BP:           128/77 mmHg Patient Gender: M               HR:           96 bpm. Exam Location:  Inpatient Procedure: 3D Echo, Transesophageal Echo, Cardiac Doppler and Color Doppler Indications:     Bacteremia  History:         Patient has prior history of Echocardiogram examinations, most                  recent 09/02/2021. Cardiomyopathy and CHF, Pacemaker and                  Abnormal ECG, Signs/Symptoms:Hypotension; Risk Factors:Diabetes                  and Sleep Apnea. VSD.  Sonographer:     Roseanna Rainbow RDCS Referring Phys:  [place] Diagnosing Phys: Franki Monte PROCEDURE: After discussion of the risks and benefits of a TEE, an informed consent was obtained from the patient. The transesophogeal probe was passed without difficulty through the esophogus of the patient. Imaged were obtained with the patient in a left lateral decubitus position. Sedation performed by different physician. The patient was monitored while under deep sedation. Anesthestetic sedation was provided intravenously by Anesthesiology: 264m of Propofol, 1060mof Lidocaine. The patient developed Respiratory depression during the procedure. Suboptimal aorta and transgastric images due to respiratory depression.  IMPRESSIONS  1. No residual VSD noted. Left ventricular ejection fraction, by estimation, is 35 to 40%. The left ventricle has moderately decreased function. The left ventricle demonstrates global hypokinesis. There is mild left ventricular hypertrophy.  2. There is a 0.5 x 0.9 cm vegetation attached to the ICD lead as it passes through the right atrium. Right ventricular systolic function is moderately reduced. The right ventricular size is mildly enlarged.  3. Left atrial size was mildly dilated. No left atrial/left atrial appendage thrombus was detected.  4. Right atrial size was mildly dilated.  5. No MV vegetation. The mitral valve is normal in structure. No evidence of mitral valve regurgitation. No evidence of mitral stenosis.  6. Peak RV-RA gradient 17 mmHg. No TV vegetation.  7. No aortic valve vegetation. The aortic valve is tricuspid. Aortic valve regurgitation is not visualized. No aortic stenosis is present.  8. Peak pulmonic valve gradient 13 mmHg. No PV vegetation. Mild pulmonic stenosis.  9. PFO by color doppler. FINDINGS  Left Ventricle: No residual VSD noted. Left ventricular ejection fraction, by estimation, is 35 to 40%. The left ventricle has moderately decreased function. The left ventricle demonstrates global hypokinesis. The left ventricular internal cavity size was normal in size. There is mild left ventricular hypertrophy. Right Ventricle: There is a 0.5 x 0.9 cm vegetation attached to the ICD lead as it passes through the right atrium. The right ventricular size is mildly enlarged. No increase in right ventricular wall thickness. Right ventricular systolic function is moderately reduced. Left Atrium: Left atrial size was mildly dilated. No left atrial/left atrial appendage thrombus was detected. Right Atrium: Right atrial size was mildly dilated. Pericardium: There is no evidence of pericardial effusion. Mitral Valve: No MV vegetation. The mitral valve is normal in structure. No evidence  of mitral valve regurgitation. No evidence of mitral valve stenosis. Tricuspid Valve: Peak RV-RA gradient 17 mmHg. No TV vegetation. The tricuspid valve is normal in structure. Tricuspid valve regurgitation is trivial. Aortic Valve: No aortic valve vegetation. The aortic valve is tricuspid. Aortic valve regurgitation is not visualized. No aortic stenosis is present. Pulmonic Valve: Peak pulmonic valve gradient 13 mmHg. No PV vegetation. The pulmonic valve was thickened with good excursion. Pulmonic valve regurgitation is not visualized. Mild pulmonic stenosis. Aorta: The aortic root is normal in size and structure. IAS/Shunts: PFO by color doppler. Additional Comments: A device lead is visualized in the right ventricle. [employer] Electronically signed by Franki Monte Signature Date/Time: 09/04/2021/2:17:47 PM    Final    CUP PACEART INCLINIC DEVICE CHECK  Result Date: 08/14/2021 ICD check in clinic. Normal device function. Thresholds and sensing consistent with previous device measurements. Impedance trends stable over time. No ventricular arrhythmias. Histogram distribution appropriate for patient and level of activity. No changes made this session. Device programmed at appropriate safety margins. Device programmed to optimize intrinsic conduction. Estimated longevity 3.9 years. Merlin 5/8/23Elizabeth Watts BSN,RN,CCDS  [place] EKG SITE RITE  Result Date: 09/06/2021 If [facility] image not attached, placement could not be confirmed due to current cardiac rhythm.  [place] EKG SITE RITE  Result Date: 09/01/2021 If Site Rite image not attached, placement could not be confirmed due to current cardiac rhythm.  HYBRID OR IMAGING ([facility] ONLY)  Result Date: 09/05/2021 There is no interpretation for this exam.  This order is for images obtained during a surgical procedure.  Please See "Surgeries" Tab for more information regarding the procedure.    Microbiology: Recent Results (from the past 240 hour(s))  Resp  Panel by RT-PCR (Flu A&B, Covid) Nasopharyngeal Swab     Status: None   Collection Time: 09/01/21  1:24 PM   Specimen: Nasopharyngeal Swab; Nasopharyngeal(NP) swabs in vial transport medium  Result Value Ref Range Status   SARS Coronavirus 2 by RT PCR NEGATIVE NEGATIVE Final    Comment: (NOTE) SARS-CoV-2 target nucleic acids are NOT DETECTED.  The SARS-CoV-2 RNA is generally detectable in upper respiratory specimens during the acute phase of infection. The lowest concentration of SARS-CoV-2 viral copies this assay can detect is 138 copies/mL. A negative result does not preclude SARS-Cov-2 infection and should not be used as the sole basis for treatment or other patient management decisions.  A negative result may occur with  improper specimen collection/handling, submission of specimen other than nasopharyngeal swab, presence of viral mutation(s) within the areas targeted by this assay, and inadequate number of viral copies(<138 copies/mL). A negative result must be combined with clinical observations, patient history, and epidemiological information. The expected result is Negative.  Fact Sheet for Patients:  EntrepreneurPulse.com.au  Fact Sheet for Healthcare Providers:  IncredibleEmployment.be  This test is no t yet approved or cleared by the [place] FDA and  has been authorized for detection and/or diagnosis of SARS-CoV-2 by FDA under an Emergency Use Authorization (EUA). This EUA will remain  in effect (meaning this test can be used) for the duration of the COVID-19 declaration under Section 564(b)(1) of the Act, 21 U.S.C.section 360bbb-3(b)(1), unless the authorization is terminated  or revoked sooner.       Influenza A by PCR NEGATIVE NEGATIVE Final   Influenza B by PCR NEGATIVE NEGATIVE Final    Comment: (NOTE) The Xpert Xpress SARS-CoV-2/FLU/RSV plus assay is intended as an aid in the diagnosis of influenza from Nasopharyngeal  swab specimens and should not be used as a sole basis for treatment. Nasal washings and aspirates are unacceptable for Xpert Xpress SARS-CoV-2/FLU/RSV testing.  Fact Sheet for Patients: EntrepreneurPulse.com.au  Fact Sheet for Healthcare Providers: IncredibleEmployment.be  This test is not yet approved or cleared by the [place] FDA and has been authorized for detection and/or diagnosis of SARS-CoV-2 by FDA under an Emergency Use Authorization (EUA). This EUA will remain in effect (meaning this test can be used) for the duration of the COVID-19 declaration under Section 564(b)(1) of the Act, 21 U.S.C. section 360bbb-3(b)(1), unless the authorization is terminated or revoked.  Performed at [facility], [place] [address], [place], [place] [address]   Blood Culture (routine x 2)     Status: Abnormal   Collection Time: 09/01/21  1:24 PM   Specimen: BLOOD  Result Value Ref Range Status   Specimen Description BLOOD LEFT ANTECUBITAL  Final   Special Requests   Final    BOTTLES DRAWN AEROBIC AND ANAEROBIC Blood Culture adequate volume   Culture  Setup Time   Final    GRAM POSITIVE COCCI IN CLUSTERS ANAEROBIC BOTTLE ONLY IN BOTH AEROBIC AND ANAEROBIC BOTTLES CRITICAL RESULT CALLED TO, READ BACK BY AND VERIFIED WITH: PHARMD ADRIENNE WELLBORN 09/02/21_0 :21 BY TW Performed at [facility], [place] [address], [place],  [address]    Culture STAPHYLOCOCCUS AUREUS (A)  Final   Report Status 09/04/2021 FINAL  Final   Organism ID, Bacteria STAPHYLOCOCCUS AUREUS  Final      Susceptibility   Staphylococcus aureus - MIC*    CIPROFLOXACIN <=0.5 SENSITIVE Sensitive     ERYTHROMYCIN <=0.25 SENSITIVE Sensitive     GENTAMICIN <=0.5 SENSITIVE Sensitive     OXACILLIN <=0.25 SENSITIVE Sensitive     TETRACYCLINE <=1 SENSITIVE Sensitive     VANCOMYCIN <=0.5 SENSITIVE Sensitive     TRIMETH/SULFA <=10 SENSITIVE Sensitive     CLINDAMYCIN <=0.25  SENSITIVE Sensitive     RIFAMPIN <=0.5 SENSITIVE Sensitive     Inducible Clindamycin NEGATIVE Sensitive     * STAPHYLOCOCCUS AUREUS  Urine Culture     Status: None   Collection Time: 09/01/21  1:24 PM   Specimen: In/Out Cath Urine  Result Value Ref Range Status   Specimen Description IN/OUT CATH URINE  Final   Special Requests NONE  Final   Culture   Final    NO  GROWTH Performed at [facility], [place] [address], [place], [place] [address]    Report Status 09/02/2021 FINAL  Final  Blood Culture ID Panel (Reflexed)     Status: Abnormal   Collection Time: 09/01/21  1:24 PM  Result Value Ref Range Status   Enterococcus faecalis NOT DETECTED NOT DETECTED Final   Enterococcus Faecium NOT DETECTED NOT DETECTED Final   Listeria monocytogenes NOT DETECTED NOT DETECTED Final   Staphylococcus species DETECTED (A) NOT DETECTED Final    Comment: CRITICAL RESULT CALLED TO, READ BACK BY AND VERIFIED WITH: PHARMD ADRIENNE WELLBORN 09/02/21_0 :21 BY TW    Staphylococcus aureus (BCID) DETECTED (A) NOT DETECTED Final    Comment: CRITICAL RESULT CALLED TO, READ BACK BY AND VERIFIED WITH: PHARMD ADRIENNE WELLBORN 09/02/21_1 :21 BY TW    Staphylococcus epidermidis NOT DETECTED NOT DETECTED Final   Staphylococcus lugdunensis NOT DETECTED NOT DETECTED Final   Streptococcus species NOT DETECTED NOT DETECTED Final   Streptococcus agalactiae NOT DETECTED NOT DETECTED Final   Streptococcus pneumoniae NOT DETECTED NOT DETECTED Final   Streptococcus pyogenes NOT DETECTED NOT DETECTED Final   A.calcoaceticus-baumannii NOT DETECTED NOT DETECTED Final   Bacteroides fragilis NOT DETECTED NOT DETECTED Final   Enterobacterales NOT DETECTED NOT DETECTED Final   Enterobacter cloacae complex NOT DETECTED NOT DETECTED Final   Escherichia coli NOT DETECTED NOT DETECTED Final   Klebsiella aerogenes NOT DETECTED NOT DETECTED Final   Klebsiella oxytoca NOT DETECTED NOT DETECTED Final   Klebsiella pneumoniae NOT  DETECTED NOT DETECTED Final   Proteus species NOT DETECTED NOT DETECTED Final   Salmonella species NOT DETECTED NOT DETECTED Final   Serratia marcescens NOT DETECTED NOT DETECTED Final   Haemophilus influenzae NOT DETECTED NOT DETECTED Final   Neisseria meningitidis NOT DETECTED NOT DETECTED Final   Pseudomonas aeruginosa NOT DETECTED NOT DETECTED Final   Stenotrophomonas maltophilia NOT DETECTED NOT DETECTED Final   Candida albicans NOT DETECTED NOT DETECTED Final   Candida auris NOT DETECTED NOT DETECTED Final   Candida glabrata NOT DETECTED NOT DETECTED Final   Candida krusei NOT DETECTED NOT DETECTED Final   Candida parapsilosis NOT DETECTED NOT DETECTED Final   Candida tropicalis NOT DETECTED NOT DETECTED Final   Cryptococcus neoformans/gattii NOT DETECTED NOT DETECTED Final   Meth resistant mecA/C and MREJ NOT DETECTED NOT DETECTED Final    Comment: Performed at [facility], [address], [place], [place] [address]  Blood Culture (routine x 2)     Status: None   Collection Time: 09/01/21  5:13 PM   Specimen: BLOOD RIGHT HAND  Result Value Ref Range Status   Specimen Description BLOOD RIGHT HAND  Final   Special Requests   Final    BOTTLES DRAWN AEROBIC AND ANAEROBIC Blood Culture results may not be optimal due to an inadequate volume of blood received in culture bottles   Culture   Final    NO GROWTH 5 DAYS Performed at [facility], [place] [address], [place], [place] [address]    Report Status 09/06/2021 FINAL  Final  MRSA Next Gen by PCR, Nasal     Status: None   Collection Time: 09/01/21  5:29 PM   Specimen: Nasal Mucosa; Nasal Swab  Result Value Ref Range Status   MRSA by PCR Next Gen NOT DETECTED NOT DETECTED Final    Comment: (NOTE) The GeneXpert MRSA Assay (FDA approved for NASAL specimens only), is one component of a comprehensive MRSA colonization surveillance program. It is  not intended to diagnose MRSA infection nor to guide or monitor treatment for  MRSA infections. Test performance is not FDA approved in patients less than 80 years old. Performed at [facility], [place] [address] [place], [place] [address]   Culture, blood (Routine X 2) w Reflex to ID Panel     Status: None   Collection Time: 09/02/21  9:38 AM   Specimen: BLOOD  Result Value Ref Range Status   Specimen Description BLOOD BLOOD LEFT FOREARM  Final   Special Requests   Final    BOTTLES DRAWN AEROBIC AND ANAEROBIC Blood Culture adequate volume   Culture   Final    NO GROWTH 5 DAYS Performed at [facility], [place] [address], [place], [place] [address]    Report Status 09/07/2021 FINAL  Final  Culture, blood (Routine X 2) w Reflex to ID Panel     Status: None   Collection Time: 09/02/21  9:39 AM   Specimen: BLOOD  Result Value Ref Range Status   Specimen Description BLOOD BLOOD LEFT HAND  Final   Special Requests   Final    BOTTLES DRAWN AEROBIC AND ANAEROBIC Blood Culture adequate volume   Culture   Final    NO GROWTH 5 DAYS Performed at [facility], [place] [address] [place], [place] [address]    Report Status 09/07/2021 FINAL  Final  Culture, blood (Routine X 2) w Reflex to ID Panel     Status: None   Collection Time: 09/05/21  8:13 PM   Specimen: BLOOD  Result Value Ref Range Status   Specimen Description BLOOD RIGHT ANTECUBITAL  Final   Special Requests   Final    BOTTLES DRAWN AEROBIC AND ANAEROBIC Blood Culture results may not be optimal due to an inadequate volume of blood received in culture bottles   Culture   Final    NO GROWTH 5 DAYS Performed at [facility], [place] [address], [place], [place] [address]    Report Status 09/10/2021 FINAL  Final  Culture, blood (Routine X 2) w Reflex to ID Panel     Status: None   Collection Time: 09/05/21  8:13 PM   Specimen: BLOOD  Result Value Ref Range Status   Specimen Description BLOOD RIGHT ANTECUBITAL  Final   Special Requests   Final    BOTTLES DRAWN AEROBIC AND ANAEROBIC Blood  Culture results may not be optimal due to an inadequate volume of blood received in culture bottles   Culture   Final    NO GROWTH 5 DAYS Performed at [facility], [place] [address], [place], [place] [address]    Report Status 09/10/2021 FINAL  Final     Labs: Basic Metabolic Panel: Recent Labs  Lab 09/04/21 0258 09/05/21 0208 09/07/21 0035 09/09/21 0024 09/10/21 0234  NA 135 136 136 131* 133*  K 3.9 3.6 3.4* 3.6 3.9  CL 97* 93* 93* 91* 96*  CO2 29 33* _0 GLUCOSE 115* 109* 161* 120* 126*  BUN 11 15 23* 26* 25*  CREATININE 0.95 0.94 1.01 1.01 1.12  CALCIUM 8.6* 8.4* 8.9 8.7* 8.7*  MG 2.2  --   --  2.4  --    Liver Function Tests: Recent Labs  Lab 09/07/21 0035 09/09/21 0024 09/10/21 0234  AST 105* 53* 58*  ALT 92* 56* 55*  ALKPHOS 55 56 51  BILITOT 0.5 0.5 0.4  PROT 6.7 6.9 6.8  ALBUMIN 3.3*  3.4* 3.4*   No results for input(s): LIPASE, AMYLASE in the last 168 hours. No results for input(s): AMMONIA in the last 168 hours. CBC: Recent Labs  Lab 09/04/21 0258 09/05/21 0208 09/07/21 0035 09/09/21 0024  WBC 4.5 4.7 10.7* 6.9  NEUTROABS  --   --  7.4 3.8  HGB 13.1 13.0 12.8* 13.3  HCT 40.8 40.2 39.4 40.0  MCV 97.4 96.6 95.9 95.7  PLT 97* 106* 184 224   Cardiac Enzymes: No results for input(s): CKTOTAL, CKMB, CKMBINDEX, TROPONINI in the last 168 hours. BNP: BNP (last 3 results) Recent Labs    06/19/21 0957 09/01/21 1713 09/02/21 0526  BNP 235.5* 130.8* 231.9*    ProBNP (last 3 results) No results for input(s): PROBNP in the last 8760 hours.  CBG: Recent Labs  Lab 09/09/21 0755 09/09/21 1158 09/09/21 1633 09/09/21 2037 09/10/21 0736  GLUCAP 214* 108* 136* 131* 123*       Signed:  Nita Sells MD   [facility] 09/10/2021, 11:05 AM

## 2021-09-10 NOTE — Progress Notes (Signed)
Mobility Specialist Progress Note:   09/10/21 1045  Mobility  Activity Ambulated independently in hallway  Level of Assistance Independent  Assistive Device None  Distance Ambulated (ft) 550 ft  Activity Response Tolerated well  $Mobility charge 1 Mobility   Pt eager for mobility session this am. Ambulated independently throughout. Pt back in room with all needs met.   Nelta Numbers [facility] Secure Chat or Office Phone: [phone]

## 2021-09-10 NOTE — Progress Notes (Signed)
RN gave patient his discharge instructions and the patient stated understanding. Home medications were picked up and returned to the patient as well he will receive his afternoon dose of ABX prior to DC and his evening dose will be delivered at home life vest in the room pt knows to have it on prior to leaving. PICC line in place will place order for flush and cap when abx done.

## 2021-09-10 NOTE — TOC CM/SW Note (Addendum)
HF TOC CM contacted [facility] to make aware of dc home today with [facility] RN, and IV abx. Spoke to [employer] States they will plan a soc for 09/11/2021. Contacted [employer] rep, Pam and they will deliver meds to home. States she has completed teaching on how to administer IV abx at home. Pt has Life Vest in the room. Wife providing transportation home.    Medications reviewed. Meds delivered from [place], Heart Failure TOC CM [phone]

## 2021-09-10 NOTE — Progress Notes (Signed)
  Lifevest has been delivered. Outpatient follow up arranged.   EP to follow from a distance.   Legrand Como [address] [place], PA-C  09/10/2021 8:56 AM

## 2021-09-10 NOTE — Progress Notes (Addendum)
Patient ID: Kevin Cardenas, male   DOB: [birth year], 46 y.o.   MRN: [id number]     Advanced Heart Failure Rounding Note  PCP-Cardiologist: [name], MD   Subjective:    Admitted w/ MSSA bacteremia. AF w/ abx. ID following.   Echo: EF 35%, moderate RV enlargement, normal RV function, no significant pulmonary valve stenosis noted, no obvious vegetation.   TEE: LV EF 35-40%, mild RV dilation with moderate RV dysfunction, mild PS (mean gradient 13 mmHg), no VSD, there is a small 0.5 x 0.9 cm vegetation on the ICD lead as it traverses the RA, no valvular vegetation noted.   S/p ICD extraction 05/25  Repeat BC 5/25 No growth x 5 days  Getting PICC placed today. LifeVest has been delivered.  Entresto held by RN last PM due to soft BP. He denies orthostatic symptoms.  Ambulating w/o difficulty. Denies dyspnea. No complaints.   Objective:   Weight Range: 105 kg Body mass index is 37.36 kg/m.   Vital Signs:   Temp:  [98.2 F (36.8 C)-98.5 F (36.9 C)] 98.3 F (36.8 C) (05/30 0407) Pulse Rate:  [82-100] 88 (05/30 0407) Resp:  [16-18] 16 (05/30 0407) BP: (92-121)/(69-83) 109/83 (05/30 0407) SpO2:  [89 %-97 %] 95 % (05/30 0407) Last BM Date : 09/09/21  Weight change: Filed Weights   09/07/21 0500 09/08/21 0500 09/09/21 0459  Weight: 103.6 kg 104.2 kg 105 kg    Intake/Output:   Intake/Output Summary (Last 24 hours) at 09/10/2021 0713 Last data filed at 09/09/2021 1750 Gross per 24 hour  Intake 480 ml  Output --  Net 480 ml       Physical Exam   General:  Well appearing, moderately obese. No respiratory difficulty HEENT: normal Neck: supple. no JVD. Carotids 2+ bilat; no bruits. No lymphadenopathy or thyromegaly appreciated. Cor: PMI nondisplaced. Regular rate & rhythm. No rubs, gallops or murmurs. Lungs: clear Abdomen: soft, nontender, nondistended. No hepatosplenomegaly. No bruits or masses. Good bowel sounds. Extremities: no cyanosis, clubbing, rash,  edema Neuro: alert & oriented x 3, cranial nerves grossly intact. moves all 4 extremities w/o difficulty. Affect pleasant.   Telemetry   NSR 80s   Labs    CBC Recent Labs    09/09/21 0024  WBC 6.9  NEUTROABS 3.8  HGB 13.3  HCT 40.0  MCV 95.7  PLT 188   Basic Metabolic Panel Recent Labs    09/09/21 0024 09/10/21 0234  NA 131* 133*  K 3.6 3.9  CL 91* 96*  CO2 29 28  GLUCOSE 120* 126*  BUN 26* 25*  CREATININE 1.01 1.12  CALCIUM 8.7* 8.7*  MG 2.4  --    Liver Function Tests Recent Labs    09/09/21 0024 09/10/21 0234  AST 53* 58*  ALT 56* 55*  ALKPHOS 56 51  BILITOT 0.5 0.4  PROT 6.9 6.8  ALBUMIN 3.4* 3.4*    No results for input(s): LIPASE, AMYLASE in the last 72 hours. Cardiac Enzymes No results for input(s): CKTOTAL, CKMB, CKMBINDEX, TROPONINI in the last 72 hours.  BNP: BNP (last 3 results) Recent Labs    06/19/21 0957 09/01/21 1713 09/02/21 0526  BNP 235.5* 130.8* 231.9*    ProBNP (last 3 results) No results for input(s): PROBNP in the last 8760 hours.   D-Dimer No results for input(s): DDIMER in the last 72 hours.  Hemoglobin A1C No results for input(s): HGBA1C in the last 72 hours. Fasting Lipid Panel No results for input(s): CHOL, HDL, LDLCALC, TRIG,  CHOLHDL, LDLDIRECT in the last 72 hours. Thyroid Function Tests No results for input(s): TSH, T4TOTAL, T3FREE, THYROIDAB in the last 72 hours.  Invalid input(s): FREET3  Other results:   Imaging    No results found.   Medications:     Scheduled Medications:  arformoterol  15 mcg Nebulization BID   budesonide (PULMICORT) nebulizer solution  0.25 mg Nebulization BID   carvedilol  3.125 mg Oral BID WC   Chlorhexidine Gluconate Cloth  6 each Topical Daily   dapagliflozin propanediol  10 mg Oral Daily   digoxin  0.125 mg Oral Daily   insulin aspart  0-9 Units Subcutaneous TID WC   mouth rinse  15 mL Mouth Rinse BID   potassium chloride  20 mEq Oral Daily    sacubitril-valsartan  1 tablet Oral BID   simvastatin  20 mg Oral QHS   torsemide  40 mg Oral q morning    Infusions:  sodium chloride Stopped (09/04/21 0648)    ceFAZolin (ANCEF) IV 2 g (09/10/21 0504)    PRN Medications: acetaminophen, docusate sodium, ipratropium-albuterol, ondansetron (ZOFRAN) IV, polyethylene glycol    Assessment/Plan   1. Septic shock with MSSA bacteremia: ?Source from RLE cellulitis.  PCT 25 initially with high fever and hypotension requiring norepinephrine, now off pressor.  Blood cultures 5/21 with MSSA.  Has [employer] ICD => TEE 5/24 showed 0.5 x 0.9 cm vegetation on the ICD lead as it traverses the RA. No valvular vegetation noted.  - Continue cefazolin, ID following. - S/p ICD system extraction 05/25  - Repeat BCx 5/25 no growth  - Per ID, will need cefazolin x 6 wks. Plan PICC placement today  2. Acute on chronic systolic CHF: Nonischemic cardiomyopathy.  RHC in 4/16 showed preserved cardiac index by thermodilution but low by Fick.   Last echo in 8/21 showed EF 30-35%, normal RV, no residual VSD, no definite pulmonic stenosis.  Echo in 3/23 showed EF 30-35%, mild LVH, no VSD, no significant pulmonary stenosis, moderate RVE with normal RV systolic function. LHC/RHC in 3/23 with no coronary disease, CI 3.28.  He has a [employer] ICD.  Echo this admission looked similar to 3/23, no definite vegetation noted.  TEE showed LV EF 35-40%, mild RV dilation with moderate RV dysfunction, mild PS (mean gradient 13 mmHg), no VSD, there is a small 0.5 x 0.9 cm vegetation on the ICD lead as it traverses the RA, no valvular vegetation noted.  He diuresed well with IV Lasix. - Volume looks good today. Continue home torsemide 40 qam/20 qpm.  - Continue Coreg 3.125 mg bid.  - Continue Entresto 24/26 bid (was on 49/51 bid at home, on reduced dose w/ soft BP). - Continue spironolactone 25 mg daily.  - Continue digoxin 0.125 daily.   - Continue Farxiga 10 mg daily.   - ICD system  extracted 05/25 d/t endocarditis. Home w/ LifeVest  - Once endocarditis has been fully treated, think he would be a good subcutaneous defibrillator candidate.  3. H/o VSD: No residual shunt noted 3/23 echo.  Last RHC also did not suggest any significant left to right shunting. TEE this admit showed no definite VSD.  4. Pulmonary stenosis: Mild on TEE, peak gradient 13 mmHg.  5. OSA: CPAP at night.   Stable for d/c home from HF standpoint, after PICC line placement. W/ soft BPs may need to hold PM dose of torsemide and take PM dose PRN. Will defer to [name]. We will arrange post hospital  f/u in the [facility].   Length of Stay: [address] PA-C  09/10/2021, 7:13 AM  Advanced Heart Failure Team Pager [phone] (M-F; 7a - 5p)  Please contact [facility] for night-coverage after hours (5p -7a ) and weekends on [email or web address]   Patient seen with PA, agree with the above note.   He is stable today, no complaints.  No lightheadedness.  Blood cultures remain negative.   General: NAD Neck: No JVD, no thyromegaly or thyroid nodule.  Lungs: Clear to auscultation bilaterally with normal respiratory effort. CV: Nondisplaced PMI.  Heart regular S1/S2, no S3/S4, no murmur.  No peripheral edema.   Abdomen: Soft, nontender, no hepatosplenomegaly, no distention.  Skin: Intact without lesions or rashes.  Neurologic: Alert and oriented x 3.  Psych: Normal affect. Extremities: No clubbing or cyanosis.  HEENT: Normal.   Patient ready for home today after he gets PICC to complete 6 wks cefazolin.   He has Lifevest, will discuss future subcutaneous ICD with EP.   Would send him home on the current med regimen.  Given occasional soft BP, would not increase his Entresto.    Will arrange CHF clinic followup.  Cardiac meds for home: Entresto 24/26 bid, Coreg 3.125 bid, dapagliflozin 10 daily, digoxin 0.125, spironolactone 25 daily, and would give torsemide 20 mg bid.   [name] 09/10/2021 8:30  AM

## 2021-09-16 ENCOUNTER — Telehealth (HOSPITAL_COMMUNITY): Payer: Self-pay

## 2021-09-16 NOTE — Progress Notes (Signed)
ID:  MERRELL BORSUK, DOB 21-Jan-1976, MRN [id number]   Provider location: [facility] Type of Visit: Established patient  PCP:  [name], MD  HF Cardiologist:  [name]   History of Present Illness: Kevin Cardenas is a 46 y.o. male who has a history of VSD repair and removal of a pulmonary artery band at age 39, DM, HTN, systolic heart failure, NICM and OSA diagnosed in past but not on CPAP. Has [employer] Jude ICD (placed 01/2015).   Echo in 3/20 showed EF 25%, mild LV dilation, mildly decreased RV systolic function.   CPX in 10/20 showed moderate functional limitation primarily due to restrictive lung disease.  I had planned to get PFTs and a high resolution CT chest to followup on lung restriction but was never scheduled.   At last appointment, he had been off his meds for several months but supposedly had restarted them.  Weight was up 20 lbs and he was volume overloaded.  I stopped Lasix and started torsemide, kept other meds as ordered.  He came to the ER later in 7/21 with a bleeding varicose vein but was noted to be hypotensive with AKI.  He was thought to be over-diuresed.  Torsemide was cut back and Entresto was decreased.   Echo in 8/21 showed EF 30-35%, no significant residual VSD, normal RV.    Admitted 3/23 with a/c respiratory failure with hypoxia secondary to a/c CHF and noncompliance with CPAP for OSA/OHS. PICC line placed. Co-ox okay. He was enrolled in FASTR trial for diuresis. Diuresed 24 lb. FASTR trial discontinued on 03/11. Transitioned to torsemide 20 mg bid (prior home dose). GDMT titrated.  R/LHC on 03/13 with normal coronaries, normal filling pressures and normal CO. Mild pulmonary HTN. Referred to paramedicine. HH PT/OT recommended. Patient declined [place].  Follow up 4/23, stable NYHA II, not volume overloaded.   Re-admitted 5/23 with sepsis 2/2 right lower extremity cellulitis. Started on abx and IVF. Required NE and transferred  to ICU. Echo this admit showed EF 35% w/ global HK, no residual VSD, RV normal w/ mildly elevated RVSP, 36 mmHG, trivial MR/TR, AoV not well visualized, no vegetations noted. AHF consulted, underwent TEE showing 0.5 x 0.9 cm vegetation on the ICD lead, no valvular vegetation noted. Subsequently had ICD extraction, with plans for IV cefazolin via PICC x 6 weeks. LifeVest placed and discharged home, weight 231 lbs.  Today he returns for HF follow up with paramedicine. Overall feeling fine. Has dyspnea with stairs but otherwise no breathing issues. Denies palpitations, CP, dizziness, edema, or PND/Orthopnea. Appetite ok. No fever or chills. Weight at home 233 pounds. Taking all medications. Not wearing LifeVest as it keeps alarming. No issues with IV abx however no one has come out to change his PICC dressing yet.  Zoll interrogation (personally reviewed): not wearing vest.  ECG (personally reviewed): NSR IVCD, QRS 138 msec  Labs (1/16): K 4.1, creatinine 0.9, TSH normal, LDL 89, HDL 46 Labs (2/16): K 4.1 Creatinine 0.97  Labs (3/16): BNP 54, K 4.4, creatinine 1.0 Labs 5/16): K 4.0 Creatinine 0.93  Labs (6/16): digoxin < 0.2 Labs (9/16): K 3.7, creatinine 0.93, digoxin < 0.5, HCT 40.4 Labs (10/17): dig 0.5 K 4.1 Creatinine 0.94  Labs (6/19): K 4.3 Creatinine 0.95 Labs (3/20): K 4.3, creatinine 1.02, digoxin < 0.2, LDL 79 Labs (9/20): K 3.8, creatinine 0.82, digoxin < 0.2 Labs (7/21): K 3.8, creatinine 1.0 => 2.34 =>  1.08, BNP 81, TSH normal Labs (9/21): digoxin 0.5, K 4.3, creatinine 1.12 Labs (5/22): digoxin 0.2, K 4, creatinine 1.06 Labs (4/23): K 3.8, creatinine 1.10 Labs (5/23): K 3.9, creatinine 1.12    PMH: 1. VSD repair at 1.  2. Type 2 diabetes. 3. HTN 4. OSA: Not using CPAP.  5. Chronic systolic CHF: Nonischemic cardiomyopathy.  St Jude ICD.  - Echo 2009: EF 35-40% - LHC 2009: No angiographic coronary disease.  - Echo 9/11: LVEF 20-25%  - Echo 11/12: LVEF 30-35%, diffuse  hypokinesis and mild LVH. RV systolic mildly reduced. LA mildly dilated. Pulmonic valve with mild stenosis.  - Echo 05/12/2014: EF 15-20% Grade IDD, LV moderately dilated.  RV normal. Mild pulmonc stenosis mean peak gradient 14.  - Echo 7/16: EF 25-30%, mild LV dilation, mild LVH, trivial pulmonic stenosis with peak gradient 11 mmHg, mildly decreased RV function with mild RV dilation.   - CPX (3/16): RER 1.03, peak VO2 19.9, VE/VCO2 slope 18, mild to moderately impaired functional capacity.  There was moderate to severe mixed restrictive/obstructive pattern on spirometry => exertional hypoxemia, oxygen saturation decreased to 84%.  - RHC (4/16): mean RA 9, PA 35/12, mean PCWP 8, CI 1.8 Fick, CI 2.4 thermo.  - Echo 02/2106: EF 30-35% Grade IIDD  - Echo 3/20: EF 25%, mild LV dilation, diffuse hypokinesis, mildly decreased RV systolic function with mild RV dilation, no definite PS noted.  - CPX (10/20): VE/VCO2 16, peak VO2 15.8 (57% predicted), RER 1.08.  Moderate functional limitation primarily due to restrictive lung disease.  Severe restriction on PFTs.  - Echo (8/21): EF 30-35%, no significant residual VSD, normal RV.  - Echo (5/23): EF 35% w/ global HK, no residual VSD, RV normal w/ mildly elevated RVSP, 36 mmHG. Trival MR/TR. AoV not well visualized. No vegetations noted. 6. Pulmonic stenosis: Mild in past, not significant on 2/20 echo.  7. High resolution CT chest in 8/21: No evidence for ILD.  8. MSSA  bacteremia: TEE 09/04/21 showed 0.5 x 0.9 cm vegetation on the ICD lead as it traverses the RA. No valvular vegetation noted. - s/p ICD extraction 5/23.  Current Outpatient Medications  Medication Sig Dispense Refill   ascorbic acid (VITAMIN C) 500 MG tablet Take 500 mg by mouth daily.     carvedilol (COREG) 3.125 MG tablet Take 1 tablet (3.125 mg total) by mouth 2 (two) times daily with a meal. 60 tablet 1   ceFAZolin (ANCEF) IVPB Inject 2 g into the vein every 8 (eight) hours. Indication:   Bacteremia First Dose: Yes Last Day of Therapy:  10/17/2021 Labs - Once weekly:  CBC/D and BMP, Labs - Every other week:  ESR and CRP Method of administration: IV Push Method of administration may be changed at the discretion of home infusion pharmacist based upon assessment of the patient and/or caregiver's ability to self-administer the medication ordered. 123 Units 0   dapagliflozin propanediol (FARXIGA) 10 MG TABS tablet Take 1 tablet (10 mg total) by mouth daily. 90 tablet 3   digoxin (LANOXIN) 0.125 MG tablet Take 1 tablet (125 mcg total) by mouth daily. 90 tablet 2   fluticasone furoate-vilanterol (BREO ELLIPTA) 100-25 MCG/ACT AEPB Inhale 1 puff into the lungs daily. 60 each 11   potassium chloride SA (KLOR-CON M) 20 MEQ tablet Take 1 tablet (20 mEq total) by mouth daily. 90 tablet 2   sacubitril-valsartan (ENTRESTO) 49-51 MG Take 1 tablet by mouth 2 (two) times daily. 180 tablet 3   simvastatin (  ZOCOR) 20 MG tablet Take 1 tablet (20 mg total) by mouth at bedtime. 90 tablet 3   spironolactone (ALDACTONE) 25 MG tablet Take 1 tablet (25 mg total) by mouth daily. 30 tablet 1   torsemide (DEMADEX) 20 MG tablet Take 1 tablet (20 mg total) by mouth 2 (two) times daily. 60 tablet 1   No current facility-administered medications for this encounter.    Allergies:   Metolazone   Social History:  The patient  reports that he quit smoking about 10 years ago. His smoking use included cigarettes. He has a 4.50 pack-year smoking history. He has never used smokeless tobacco. He reports that he does not drink alcohol and does not use drugs.   Family History:  The patient's family history includes Diabetes in his maternal grandfather and paternal grandmother; Lung cancer in his father; Stroke in his father and mother.   ROS:  Please see the history of present illness.   All other systems are personally reviewed and negative.   Exam:   BP 106/64   Pulse 89   Wt 106.7 kg (235 lb 3.2 oz)   SpO2 95%    BMI 37.96 kg/m  General:  NAD. No resp difficulty HEENT: Normal Neck: Supple. No JVD. Carotids 2+ bilat; no bruits. No lymphadenopathy or thryomegaly appreciated. Cor: PMI nondisplaced. Regular rate & rhythm. No rubs, gallops or murmurs. Lungs: Diminished in bases. Abdomen: Soft, nontender, nondistended. No hepatosplenomegaly. No bruits or masses. Good bowel sounds. Extremities: No cyanosis, clubbing, rash, trace BLE edema; PICC looks OK Neuro: Alert & oriented x 3, cranial nerves grossly intact. Moves all 4 extremities w/o difficulty. Affect pleasant.  Recent Labs: 06/19/2021: TSH 0.984 09/02/2021: B Natriuretic Peptide 231.9 09/09/2021: Hemoglobin 13.3; Magnesium 2.4; Platelets 224 09/10/2021: ALT 55; BUN 25; Creatinine, Ser 1.12; Potassium 3.9; Sodium 133  Personally reviewed   Wt Readings from Last 3 Encounters:  09/17/21 106.7 kg (235 lb 3.2 oz)  09/09/21 105 kg (231 lb 7.7 oz)  08/21/21 106.1 kg (234 lb)   ASSESSMENT AND PLAN: 1. Chronic systolic CHF: Nonischemic cardiomyopathy.  RHC in 4/16 showed preserved cardiac index by thermodilution but low by Fick.   Last echo in 8/21 showed EF 30-35%, normal RV, no residual VSD, no definite pulmonic stenosis.  Echo in 3/23 showed EF 30-35%, mild LVH, no VSD, no significant pulmonary stenosis, moderate RVE with normal RV systolic function. LHC/RHC in 3/23 with no coronary disease, CI 3.28.  He had a [employer] ICD. Echo during recent admission (5/23), looked similar to 3/23.  TEE (5/23) showed LV EF 35-40%, mild RV dilation with moderate RV dysfunction, mild PS (mean gradient 13 mmHg), no VSD, there is a small 0.5 x 0.9 cm vegetation on the ICD lead as it traverses the RA, no valvular vegetation noted.  Now, ICD extraction but not wearing LifeVest. Stable NYHA II, he is not volume overloaded today. - Continue torsemide 20 mg bid. BMET today. - Continue Coreg 3.125 mg bid.  - Continue Entresto 49/51 bid. - Continue spironolactone 25 mg daily.  -  Continue digoxin 0.125 daily.  He did not take his dose yet today, check dig level. - Continue Farxiga 10 mg daily.   - Once endocarditis has been fully treated, think he would be a good subcutaneous defibrillator candidate.  2. H/o MSSA bacteremia: ? Source from RLE cellulitis. Had St Jude ICD => TEE 09/04/21 showed 0.5 x 0.9 cm vegetation on the ICD lead as it traverses the RA, no valvular  vegetation noted. ICD extracted 09/05/21 - On cefazolin x 6 wks per ID, via PICC. Will reach out to [facility], PICC dressing needs to be changed. - CBC today. 3. H/o VSD: No residual shunt noted 3/23 echo.  Last RHC also did not suggest any significant left to right shunting. TEE this admit (5/23) showed no definite VSD.  4. Pulmonary stenosis: Mild on TEE, peak gradient 13 mmHg.  5. OSA: CPAP at night.    Keep follow up next month with [name], as scheduled.  Allena Katz, FNP-BC 09/17/21

## 2021-09-16 NOTE — Telephone Encounter (Signed)
Called to confirm/remind patient of their appointment at the [facility] on 09/17/21.   Patient reminded to bring all medications and/or complete list.  Confirmed patient has transportation. Gave directions, instructed to utilize [place] parking.  Confirmed appointment prior to ending call.

## 2021-09-17 ENCOUNTER — Ambulatory Visit (HOSPITAL_COMMUNITY)
Admit: 2021-09-17 | Discharge: 2021-09-17 | Disposition: A | Discharge status: Home / Self Care | Payer: 59 | Attending: Family Medicine | Admitting: Family Medicine

## 2021-09-17 ENCOUNTER — Encounter (HOSPITAL_COMMUNITY): Payer: Self-pay

## 2021-09-17 ENCOUNTER — Other Ambulatory Visit (HOSPITAL_COMMUNITY): Payer: Self-pay

## 2021-09-17 ENCOUNTER — Other Ambulatory Visit (HOSPITAL_COMMUNITY): Payer: Self-pay | Admitting: Physician Assistant

## 2021-09-17 ENCOUNTER — Encounter: Payer: 59 | Admitting: *Deleted

## 2021-09-17 VITALS — BP 106/64 | HR 89 | Wt 235.2 lb

## 2021-09-17 DIAGNOSIS — I110 Hypertensive heart disease with heart failure: Secondary | ICD-10-CM | POA: Insufficient documentation

## 2021-09-17 DIAGNOSIS — J984 Other disorders of lung: Secondary | ICD-10-CM | POA: Insufficient documentation

## 2021-09-17 DIAGNOSIS — I370 Nonrheumatic pulmonary valve stenosis: Secondary | ICD-10-CM

## 2021-09-17 DIAGNOSIS — R7881 Bacteremia: Secondary | ICD-10-CM | POA: Insufficient documentation

## 2021-09-17 DIAGNOSIS — Z79899 Other long term (current) drug therapy: Secondary | ICD-10-CM | POA: Diagnosis not present

## 2021-09-17 DIAGNOSIS — Z95810 Presence of automatic (implantable) cardiac defibrillator: Secondary | ICD-10-CM | POA: Diagnosis not present

## 2021-09-17 DIAGNOSIS — Z8774 Personal history of (corrected) congenital malformations of heart and circulatory system: Secondary | ICD-10-CM

## 2021-09-17 DIAGNOSIS — B9561 Methicillin susceptible Staphylococcus aureus infection as the cause of diseases classified elsewhere: Secondary | ICD-10-CM | POA: Diagnosis not present

## 2021-09-17 DIAGNOSIS — I428 Other cardiomyopathies: Secondary | ICD-10-CM | POA: Diagnosis present

## 2021-09-17 DIAGNOSIS — L03115 Cellulitis of right lower limb: Secondary | ICD-10-CM | POA: Insufficient documentation

## 2021-09-17 DIAGNOSIS — Z006 Encounter for examination for normal comparison and control in clinical research program: Secondary | ICD-10-CM

## 2021-09-17 DIAGNOSIS — I5022 Chronic systolic (congestive) heart failure: Secondary | ICD-10-CM | POA: Diagnosis present

## 2021-09-17 DIAGNOSIS — Q210 Ventricular septal defect: Secondary | ICD-10-CM | POA: Diagnosis not present

## 2021-09-17 DIAGNOSIS — G4733 Obstructive sleep apnea (adult) (pediatric): Secondary | ICD-10-CM | POA: Insufficient documentation

## 2021-09-17 LAB — BASIC METABOLIC PANEL
Anion gap: 11 (ref 5–15)
BUN: 16 mg/dL (ref 6–20)
CO2: 29 mmol/L (ref 22–32)
Calcium: 8.8 mg/dL — ABNORMAL LOW (ref 8.9–10.3)
Chloride: 98 mmol/L (ref 98–111)
Creatinine, Ser: 1.14 mg/dL (ref 0.61–1.24)
GFR, Estimated: >60 mL/min (ref >60)
Glucose, Bld: 131 mg/dL — ABNORMAL HIGH (ref 70–99)
Potassium: 4.3 mmol/L (ref 3.5–5.1)
Sodium: 138 mmol/L (ref 135–145)

## 2021-09-17 LAB — CBC
HCT: 39.2 % (ref 39.0–52.0)
Hemoglobin: 12.7 g/dL — ABNORMAL LOW (ref 13.0–17.0)
MCH: 31.7 pg (ref 26.0–34.0)
MCHC: 32.4 g/dL (ref 30.0–36.0)
MCV: 97.8 fL (ref 80.0–100.0)
Platelets: 212 10*3/uL (ref 150–400)
RBC: 4.01 MIL/uL — ABNORMAL LOW (ref 4.22–5.81)
RDW: 14.5 % (ref 11.5–15.5)
WBC: 4.1 10*3/uL (ref 4.0–10.5)
nRBC: 0 % (ref 0.0–0.2)

## 2021-09-17 LAB — BRAIN NATRIURETIC PEPTIDE: B Natriuretic Peptide: 8.6 pg/mL (ref 0.0–100.0)

## 2021-09-17 LAB — DIGOXIN LEVEL: Digoxin Level: 0.3 ng/mL — ABNORMAL LOW (ref 0.8–2.0)

## 2021-09-17 MED ORDER — SPIRONOLACTONE 25 MG PO TABS
25.0000 mg | ORAL_TABLET | Freq: Every day | ORAL | 1 refills | Status: DC
  Filled 2021-09-17: qty 30, 30d supply, fill #0
  Filled 2021-11-04: qty 30, 30d supply, fill #1

## 2021-09-17 MED ORDER — CARVEDILOL 3.125 MG PO TABS
3.1250 mg | ORAL_TABLET | Freq: Two times a day (BID) | ORAL | 1 refills | Status: DC
  Filled 2021-09-17: qty 60, 30d supply, fill #0
  Filled 2021-11-04: qty 60, 30d supply, fill #1

## 2021-09-17 NOTE — Patient Instructions (Addendum)
Thank you for coming in today  Labs were done today, if any labs are abnormal the clinic will call you No news is good news  Your physician recommends that you schedule a follow-up appointment in:  09/26/2021 has been cancelled Keep appointment in July 2023  At the [facility], you and your health needs are our priority. As part of our continuing mission to provide you with exceptional heart care, we have created designated Provider Care Teams. These Care Teams include your primary Cardiologist (physician) and Advanced Practice Providers (APPs- Physician Assistants and Nurse Practitioners) who all work together to provide you with the care you need, when you need it.   You may see any of the following providers on your designated Care Team at your next follow up: Dr Glori Bickers Dr Haynes Kerns, NP Lyda Jester, [place] [facility] [place], [place] Audry Riles, PharmD   Please be sure to bring in all your medications bottles to every appointment.   If you have any questions or concerns before your next appointment please send [place] a message through [place] or call our office at [phone].    TO LEAVE A MESSAGE FOR THE NURSE SELECT OPTION 2, PLEASE LEAVE A MESSAGE INCLUDING: YOUR NAME DATE OF BIRTH CALL BACK NUMBER REASON FOR CALL**this is important as we prioritize the call backs  YOU WILL RECEIVE A CALL BACK THE SAME DAY AS LONG AS YOU CALL BEFORE 4:00 PM

## 2021-09-17 NOTE — Research (Signed)
   SITE #  [id number]          SUBJECT #  [id number]    SUBJECT Initials: BNM  52- DAY FOLLOW UP VISIT- Telephone Call  Discharge Date / time::  06/24/2021   at 1730     _0   Time unknown     Date of Follow-up Visit: 09/17/2021  at 0930    _1   Time unknown      Have there been any changes to the subject's medications?  _2  YES  _3   NO     Update: aspirin  discontinued 81 mg 09/10/21  Cefazolin (Ancef) 2g IV started 09/02/21- will receive for 6 weeks via home health  Did the subject experience any NEW adverse events?  _4  YES  _5   NO     Update: Patient admitted to the ED 09/01/21 with sudden onset of chills, dyspnea and 02 sats 92%. Temperature 103.1. Recent repair of RLE varicose vein- suspected area of cellulitis. Pt had TEE 09/04/21 that showed area of vegetation on ICD lead- had ICD removed 09/05/21 and received life vest and discharged home 09/10/21. Pt receiving IV cefazolin for 6 weeks from home health.  Pt doing well since hospitalization. Still receiving IV antibiotics with home health.    Current Outpatient Medications:    ascorbic acid (VITAMIN C) 500 MG tablet, Take 500 mg by mouth daily., Disp: , Rfl:    carvedilol (COREG) 3.125 MG tablet, Take 1 tablet (3.125 mg total) by mouth 2 (two) times daily with a meal., Disp: 60 tablet, Rfl: 1   ceFAZolin (ANCEF) IVPB, Inject 2 g into the vein every 8 (eight) hours. Indication:  Bacteremia First Dose: Yes Last Day of Therapy:  10/17/2021 Labs - Once weekly:  CBC/D and BMP, Labs - Every other week:  ESR and CRP Method of administration: IV Push Method of administration may be changed at the discretion of home infusion pharmacist based upon assessment of the patient and/or caregiver's ability to self-administer the medication ordered., Disp: 123 Units, Rfl: 0   dapagliflozin propanediol (FARXIGA) 10 MG TABS tablet, Take 1 tablet (10 mg total) by mouth daily., Disp: 90 tablet, Rfl: 3   digoxin (LANOXIN) 0.125 MG tablet, Take 1 tablet (125 mcg total) by mouth  daily., Disp: 90 tablet, Rfl: 2   fluticasone furoate-vilanterol (BREO ELLIPTA) 100-25 MCG/ACT AEPB, Inhale 1 puff into the lungs daily., Disp: 60 each, Rfl: 11   potassium chloride SA (KLOR-CON M) 20 MEQ tablet, Take 1 tablet (20 mEq total) by mouth daily., Disp: 90 tablet, Rfl: 2   sacubitril-valsartan (ENTRESTO) 49-51 MG, Take 1 tablet by mouth 2 (two) times daily., Disp: 180 tablet, Rfl: 3   simvastatin (ZOCOR) 20 MG tablet, Take 1 tablet (20 mg total) by mouth at bedtime., Disp: 90 tablet, Rfl: 3   spironolactone (ALDACTONE) 25 MG tablet, Take 1 tablet (25 mg total) by mouth daily., Disp: 30 tablet, Rfl: 1   torsemide (DEMADEX) 20 MG tablet, Take 1 tablet (20 mg total) by mouth 2 (two) times daily., Disp: 60 tablet, Rfl: 1

## 2021-09-17 NOTE — Progress Notes (Signed)
Paramedicine Encounter    Patient ID: Kevin Cardenas, male    DOB: 1975-07-29, 46 y.o.   MRN: [id number]   Met with [name] in clinic today where he was seen by Kevin Katz NP. He reports feeling well with no complaints over the last week since his discharge. He has been self administering his antibiotics but has not had a dressing change or tubing change since his d/c. I contacted Kevin Cardenas and they report they are coming out tomorrow to do so. Kevin Cardenas made aware of this by phone.   We reviewed medications and I called in some refills for him. He continues to take his medications out of the bottles with no issues.   Kevin Cardenas made no med changes today and obtained labs. Labs reviewed and appeared okay no changes per Kevin Cardenas.   He reports his life vest has been alarming everytime he puts it on and we advised him to contact the company directly and he agreed with plan. I will follow up in two weeks for home paramedicine visit. He knows to reach out if needed. Clinic visit complete.   Refills: Albuterol  Carvedilol      ACTION: Home visit completed

## 2021-09-18 ENCOUNTER — Ambulatory Visit (INDEPENDENT_AMBULATORY_CARE_PROVIDER_SITE_OTHER): Payer: 59

## 2021-09-18 NOTE — Patient Instructions (Addendum)
   After Your ICD Removal (Implantable Cardiac Defibrillator)    Monitor your defibrillator site for redness, swelling, and drainage. Call the device clinic at [phone] if you experience these symptoms or fever/chills.  Your incision was closed with Steri-strips: You may shower 7 days after your procedure and wash your incision with soap and water. Avoid lotions, ointments, or perfumes over your incision until it is well-healed. If the steri-strips have not fallen off in 10 days you may gently remove.

## 2021-09-18 NOTE — Progress Notes (Signed)
Device clinic appointment for wound check and suture removal s/p ICD system explant 09/06/21 for bacteriemia. Wound predominatly healed however small "lip" present to the lateral aspect of the incision line. Dr. Quentin Ore assessed. 3 thin steri-strips applied for precautionary measures. Patient is instructed to remove if steri-strips have not fallen off in 10 days. No return appointment advised. Of note patient arrived to wound check appointment WITHOUT his lifevest in place. Patient states it was alarming and he took it off. He states he felt fine during alarm. Patient is strongly advised by this RN and Dr. Quentin Ore to immediately place life vest on upon return home. Patient verbalized understanding.

## 2021-09-19 DIAGNOSIS — Z5181 Encounter for therapeutic drug level monitoring: Secondary | ICD-10-CM | POA: Insufficient documentation

## 2021-09-19 DIAGNOSIS — T827XXA Infection and inflammatory reaction due to other cardiac and vascular devices, implants and grafts, initial encounter: Secondary | ICD-10-CM | POA: Insufficient documentation

## 2021-09-19 HISTORY — DX: Encounter for therapeutic drug level monitoring: Z51.81

## 2021-09-19 NOTE — Assessment & Plan Note (Signed)
All OPAT lab work reviewed and within normal limits.  CRP normal @ 6  ESR mildly elevated @ 48 Creatinine 1.09. WBC normal.

## 2021-09-19 NOTE — Progress Notes (Signed)
Patient: Kevin Cardenas  DOB: [birth year] MRN: [id number] PCP: Dorna Mai, MD    Subjective:   Chief Complaint  Patient presents with   [facility] follow up MSSA AICD lead infection s/p extraction.     HPI: Kevin Cardenas is a 46 y.o. male. Hospitalized at [facility] recently with MSSA bacteremia due to infected AICD lead.   Underwent TEE showing 0.5 x 0.9 cm vegetation on the ICD lead, no valvular vegetation noted. System was removed 09/05/21. PICC placed and plans to continue IV cefazolin for 6 weeks to treat infection. No plans for re-implantation at this time. Working with EP/AHF team with ongoing monitoring.   BP is a little lower today but he has no concerns over it and feels it is not too far off from his norm. No dizziness or light headedness. No SOB. HR in 80s. Takes multiple BID meds for CHF.   Tolerating IV cefazolin well without any side effects. Injecting correctly 3 times a day without any missed doses. Incision healing well with steri strips falling off. RLL wound is crusted over and not draining - still has hospital dressing on this.   PICC dressing changed yesterday. PICC line is without pain, drainage or erythema and is well maintained by [facility]. No swelling or altered sensation in affected distal extremity.    Review of Systems  Constitutional:  Negative for chills, diaphoresis and fever.  Respiratory:  Negative for cough and shortness of breath.   Cardiovascular:  Negative for chest pain.  Gastrointestinal:  Negative for abdominal pain, diarrhea, nausea and vomiting.  Skin:  Negative for rash.  Neurological:  Negative for dizziness and headaches.    Past Medical History:  Diagnosis Date   AICD (automatic cardioverter/defibrillator) present    Chronic systolic CHF (congestive heart failure) (HCC)    EF 25% by echo 06/2018   Diabetes mellitus    Hypertension    Non-ischemic cardiomyopathy ([place])    Obstructive sleep apnea    VSD  (ventricular septal defect) 1978   repaired at age 65    Outpatient Medications Prior to Visit  Medication Sig Dispense Refill   ascorbic acid (VITAMIN C) 500 MG tablet Take 500 mg by mouth daily.     carvedilol (COREG) 3.125 MG tablet Take 1 tablet (3.125 mg total) by mouth 2 (two) times daily with a meal. 60 tablet 1   ceFAZolin (ANCEF) IVPB Inject 2 g into the vein every 8 (eight) hours. Indication:  Bacteremia First Dose: Yes Last Day of Therapy:  10/17/2021 Labs - Once weekly:  CBC/D and BMP, Labs - Every other week:  ESR and CRP Method of administration: IV Push Method of administration may be changed at the discretion of home infusion pharmacist based upon assessment of the patient and/or caregiver's ability to self-administer the medication ordered. 123 Units 0   dapagliflozin propanediol (FARXIGA) 10 MG TABS tablet Take 1 tablet (10 mg total) by mouth daily. 90 tablet 3   digoxin (LANOXIN) 0.125 MG tablet Take 1 tablet (125 mcg total) by mouth daily. 90 tablet 2   fluticasone furoate-vilanterol (BREO ELLIPTA) 100-25 MCG/ACT AEPB Inhale 1 puff into the lungs daily. 60 each 11   potassium chloride SA (KLOR-CON M) 20 MEQ tablet Take 1 tablet (20 mEq total) by mouth daily. 90 tablet 2   sacubitril-valsartan (ENTRESTO) 49-51 MG Take 1 tablet by mouth 2 (two) times daily. 180 tablet 3   simvastatin (ZOCOR) 20 MG tablet Take 1  tablet (20 mg total) by mouth at bedtime. 90 tablet 3   spironolactone (ALDACTONE) 25 MG tablet Take 1 tablet (25 mg total) by mouth daily. 30 tablet 1   torsemide (DEMADEX) 20 MG tablet Take 1 tablet (20 mg total) by mouth 2 (two) times daily. 60 tablet 1   No facility-administered medications prior to visit.     Allergies  Allergen Reactions   Metolazone Other (See Comments)    "Dried up my kidneys- took only one tablet"    Social History   Tobacco Use   Smoking status: Former    Packs/day: 0.30    Years: 15.00    Total pack years: 4.50    Types:  Cigarettes    Quit date: 04/08/2011    Years since quitting: 10.4   Smokeless tobacco: Never   Tobacco comments:    Smokes depends on who is around him.    Substance Use Topics   Alcohol use: No    Alcohol/week: 0.0 standard drinks of alcohol    Comment: Rarely    Drug use: No    Family History  Problem Relation Age of Onset   Lung cancer Father    Stroke Father    Stroke Mother    Diabetes Maternal Grandfather    Diabetes Paternal Grandmother    Cancer Neg Hx    Alcohol abuse Neg Hx    Drug abuse Neg Hx    Early death Neg Hx    Heart disease Neg Hx    Hyperlipidemia Neg Hx    Hypertension Neg Hx    Kidney disease Neg Hx     Objective:   Vitals:   09/20/21 0903 09/20/21 0928  BP: (!) 86/63 (!) 88/60  Pulse: 97   Temp: 98 F (36.7 C)   TempSrc: Temporal   SpO2: 97%   Weight: 244 lb (110.7 kg)    Body mass index is 39.38 kg/m.  Physical Exam Vitals reviewed.  Constitutional:      Appearance: Normal appearance. He is not ill-appearing.  HENT:     Mouth/Throat:     Mouth: Mucous membranes are moist.  Cardiovascular:     Rate and Rhythm: Normal rate and regular rhythm.     Pulses: Normal pulses.  Pulmonary:     Effort: Pulmonary effort is normal.     Breath sounds: Normal breath sounds.  Chest:       Comments: AICD extraction site is scabbed with some steri strips still in place. No drainage, redness, induration or tenderness.  Abdominal:     General: There is no distension.     Palpations: Abdomen is soft.  Musculoskeletal:        General: No swelling.  Skin:    General: Skin is warm and dry.          Comments: Crusted blood overlying vascular ulcer. Non tender. No induration, redness, warmth.   Neurological:     Mental Status: He is alert.         Lab Results: Lab Results  Component Value Date   WBC 4.1 09/17/2021   HGB 12.7 (L) 09/17/2021   HCT 39.2 09/17/2021   MCV 97.8 09/17/2021   PLT 212 09/17/2021    Lab Results  Component  Value Date   CREATININE 1.14 09/17/2021   BUN 16 09/17/2021   NA 138 09/17/2021   K 4.3 09/17/2021   CL 98 09/17/2021   CO2 29 09/17/2021    Lab Results  Component Value Date  ALT 55 (H) 09/10/2021   AST 58 (H) 09/10/2021   ALKPHOS 51 09/10/2021   BILITOT 0.4 09/10/2021     Assessment & Plan:   Problem List Items Addressed This Visit       High   MSSA bacteremia - Primary    Secondary to infected cardiac device that has since been removed. No concern for relapsing bacteremia today. Without any lingering hardware in place do not feel strongly we need clearance cultures after completion but if plans to re-implant we can certainly check these if Cardiology colleagues feel strongly.         Unprioritized   RESOLVED: Cardiac device in situ   Medication monitoring encounter    All OPAT lab work reviewed and within normal limits.  CRP normal @ 6  ESR mildly elevated @ 48 Creatinine 1.09. WBC normal.       Aicd lead infection ([place])    Removed on 09/05/21 - 6 weeks of cefazolin planned through July 6th  Will have him FU with Dr. Candiss Norse the week of July 3rd to ensure he is doing well. Plan to remove PICC line as planned after completion.       Hypotension    Will reach out to AHF team. He will replace batteries in home monitoring cuff to resume spot checks at home to ensure BP does not fall too low. Advised that if it stays < 90 systolic with these checks to update his team as well.       PICC (peripherally inserted central catheter) in place    Site unremarkable, well maintained and functioning as expected. Continue care and maintenance until planned end of IV antibiotics. Home Health team to remove at completion.          Janene Madeira, MSN, NP-C [facility] [place] Pager: [phone] Office: [phone]  09/20/21  9:54 AM

## 2021-09-20 ENCOUNTER — Ambulatory Visit (INDEPENDENT_AMBULATORY_CARE_PROVIDER_SITE_OTHER): Payer: 59 | Admitting: Infectious Diseases

## 2021-09-20 ENCOUNTER — Other Ambulatory Visit: Payer: Self-pay

## 2021-09-20 ENCOUNTER — Encounter: Payer: Self-pay | Admitting: Infectious Diseases

## 2021-09-20 VITALS — BP 88/60 | HR 97 | Temp 98.0°F | Wt 244.0 lb

## 2021-09-20 DIAGNOSIS — I959 Hypotension, unspecified: Secondary | ICD-10-CM | POA: Insufficient documentation

## 2021-09-20 DIAGNOSIS — Z452 Encounter for adjustment and management of vascular access device: Secondary | ICD-10-CM

## 2021-09-20 DIAGNOSIS — R7881 Bacteremia: Secondary | ICD-10-CM | POA: Diagnosis not present

## 2021-09-20 DIAGNOSIS — B9561 Methicillin susceptible Staphylococcus aureus infection as the cause of diseases classified elsewhere: Secondary | ICD-10-CM

## 2021-09-20 DIAGNOSIS — T827XXD Infection and inflammatory reaction due to other cardiac and vascular devices, implants and grafts, subsequent encounter: Secondary | ICD-10-CM

## 2021-09-20 DIAGNOSIS — I952 Hypotension due to drugs: Secondary | ICD-10-CM

## 2021-09-20 DIAGNOSIS — Z5181 Encounter for therapeutic drug level monitoring: Secondary | ICD-10-CM | POA: Diagnosis not present

## 2021-09-20 DIAGNOSIS — Z959 Presence of cardiac and vascular implant and graft, unspecified: Secondary | ICD-10-CM

## 2021-09-20 HISTORY — DX: Encounter for adjustment and management of vascular access device: Z45.2

## 2021-09-20 NOTE — Assessment & Plan Note (Signed)
Will reach out to AHF team. He will replace batteries in home monitoring cuff to resume spot checks at home to ensure BP does not fall too low. Advised that if it stays < 90 systolic with these checks to update his team as well.

## 2021-09-20 NOTE — Assessment & Plan Note (Addendum)
Secondary to infected cardiac device that has since been removed. No concern for relapsing bacteremia today. Without any lingering hardware in place do not feel strongly we need clearance cultures after completion but if plans to re-implant we can certainly check these if Cardiology colleagues feel strongly.

## 2021-09-20 NOTE — Assessment & Plan Note (Signed)
Removed on 09/05/21 - 6 weeks of cefazolin planned through July 6th  Will have him FU with Dr. Candiss Norse the week of July 3rd to ensure he is doing well. Plan to remove PICC line as planned after completion.

## 2021-09-20 NOTE — Patient Instructions (Signed)
Nice to see you doing so well!   Please schedule a follow up with Dr. Candiss Norse or myself at that end of your treatment around July 6th.     Let us know if any changes or concerns.

## 2021-09-20 NOTE — Assessment & Plan Note (Signed)
Site unremarkable, well maintained and functioning as expected. Continue care and maintenance until planned end of IV antibiotics. Home Health team to remove at completion.

## 2021-09-25 ENCOUNTER — Other Ambulatory Visit (HOSPITAL_COMMUNITY): Payer: Self-pay

## 2021-09-25 NOTE — Progress Notes (Signed)
Paramedicine Encounter    Patient ID: Kevin Cardenas, male    DOB: [birth year], 46 y.o.   MRN: [id number]  Arrived for home visit for [facility] where he was alert and oriented appearing to be in no distress. He reports feeling fine with no complaints. He denied shortness of breath, dizziness or chest pain. No swelling noted. Lung sounds clear.    He did not have his Life Vest on when I arrived. It was on the couch, I asked him why he was not wearing it and he says because it was making noises. I helped him put the battery in and place it on his chest and started it up, pressing the patient response button and waiting several mins to see if any alarms went off and there were none. The battery was charged and all seemed to be working correctly. I explained to [facility] the importance of wearing this device and despite it being uncomfortable he should continue wearing it daily. He verbalized agreement. I advised him if the device seemed to be malfunctioning or if he had any questions to reach out to number provided by [employer], he agreed.   Vitals and assessment obtained.  ICD removal site healing well, no redness, swelling or leakage noted.   Right lower leg wound healing well with no signs of infection or pain.   Right arm PICC line appearing to be swollen and red around the site with some brown leakage under the tegaderm. Kevin Cardenas says he gave himself the antibiotics today and said it was flushing fine but said yesterday he noticed it was red and swollen and slightly painful. It appears infiltrated.  I called [facility] who comes to change the PICC and reported same and the representative reports she will make nurse and supervisor aware of same. Kevin Cardenas should be expecting a call from them for follow up. I will ensure that he gets the follow up he needs for same. I recommended him not pushing any more fluids through it until it's assessed by [facility], he agreed.   Meds confirmed no refills needed. He takes same  out of bottles. I advised Kevin Cardenas I would follow up via telephone and home visit in two weeks. He agreed.  Visit complete.   Kevin Cardenas, [place] [phone] 09/26/2021          Patient Care Team: Kevin Mai, MD as PCP - General (Family Medicine) Kevin Dresser, MD as PCP - Advanced Heart Failure (Cardiology) Kevin Sprang, MD as PCP - Electrophysiology (Cardiology) Kevin Dresser, MD as PCP - Cardiology (Cardiology)  Patient Active Problem List   Diagnosis Date Noted   PICC (peripherally inserted central catheter) in place 09/20/2021   Hypotension 09/20/2021   Aicd lead infection ([place]) 09/19/2021   Medication monitoring encounter 09/19/2021   MSSA bacteremia 09/03/2021   Sepsis ([place]) 09/01/2021   Acute on chronic systolic and diastolic heart failure, NYHA class 3 ([place]) 06/19/2021   HFrEF (heart failure with reduced ejection fraction) ([place]) 11/06/2019   AKI (acute kidney injury) ([place]) 11/05/2019   Orthostatic hypotension 11/04/2019   Class 2 obesity due to excess calories with serious comorbidity in adult 01/16/2016   Asthma, stable, mild intermittent 01/15/2016   OSA on CPAP 06/21/2014   Routine general medical examination at a health care facility 04/18/2014   Hyperlipidemia with target LDL less than 100 04/17/2014   Non-ischemic cardiomyopathy ([place]) 82/95/6213   Systolic CHF, chronic ([place]) 02/06/2011   VSD (ventricular septal defect) 02/06/2011   HTN (  hypertension) 02/06/2011   Prediabetes 01/30/2011    Current Outpatient Medications:    ascorbic acid (VITAMIN C) 500 MG tablet, Take 500 mg by mouth daily., Disp: , Rfl:    carvedilol (COREG) 3.125 MG tablet, Take 1 tablet (3.125 mg total) by mouth 2 (two) times daily with a meal., Disp: 60 tablet, Rfl: 1   ceFAZolin (ANCEF) IVPB, Inject 2 g into the vein every 8 (eight) hours. Indication:  Bacteremia First Dose: Yes Last Day of Therapy:  10/17/2021 Labs - Once weekly:  CBC/D and BMP, Labs - Every  other week:  ESR and CRP Method of administration: IV Push Method of administration may be changed at the discretion of home infusion pharmacist based upon assessment of the patient and/or caregiver's ability to self-administer the medication ordered., Disp: 123 Units, Rfl: 0   dapagliflozin propanediol (FARXIGA) 10 MG TABS tablet, Take 1 tablet (10 mg total) by mouth daily., Disp: 90 tablet, Rfl: 3   digoxin (LANOXIN) 0.125 MG tablet, Take 1 tablet (125 mcg total) by mouth daily., Disp: 90 tablet, Rfl: 2   fluticasone furoate-vilanterol (BREO ELLIPTA) 100-25 MCG/ACT AEPB, Inhale 1 puff into the lungs daily., Disp: 60 each, Rfl: 11   potassium chloride SA (KLOR-CON M) 20 MEQ tablet, Take 1 tablet (20 mEq total) by mouth daily., Disp: 90 tablet, Rfl: 2   sacubitril-valsartan (ENTRESTO) 49-51 MG, Take 1 tablet by mouth 2 (two) times daily., Disp: 180 tablet, Rfl: 3   simvastatin (ZOCOR) 20 MG tablet, Take 1 tablet (20 mg total) by mouth at bedtime., Disp: 90 tablet, Rfl: 3   spironolactone (ALDACTONE) 25 MG tablet, Take 1 tablet (25 mg total) by mouth daily., Disp: 30 tablet, Rfl: 1   torsemide (DEMADEX) 20 MG tablet, Take 1 tablet (20 mg total) by mouth 2 (two) times daily., Disp: 60 tablet, Rfl: 1 Allergies  Allergen Reactions   Metolazone Other (See Comments)    "Dried up my kidneys- took only one tablet"     Social History   Socioeconomic History   Marital status: Single    Spouse name: Recently Married    Number of children: 0   Years of education: Not on file   Highest education level: Not on file  Occupational History   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   Tobacco Use   Smoking status: Former    Packs/day: 0.30    Years: 15.00    Total pack years: 4.50    Types: Cigarettes    Quit date: 04/08/2011    Years since quitting: 10.4   Smokeless tobacco: Never   Tobacco comments:    Smokes depends on who is around him.     Substance and Sexual Activity   Alcohol use: No    Alcohol/week: 0.0 standard drinks of alcohol    Comment: Rarely    Drug use: No   Sexual activity: Not Currently  Other Topics Concern   Not on file  Social History Narrative   Caffienated drinks-no   Seat belt use often-no   Regular Exercise-   Smoke alarm in the home-yes   Firearms/guns in the home-no   History of physical abuse-no               Social Determinants of Health   Financial Resource Strain: Not on file  Food Insecurity: No Food Insecurity (06/24/2021)   Hunger Vital Sign    Worried About Running Out of Food in  the Last Year: Never true    Surprise in the Last Year: Never true  Transportation Needs: No Transportation Needs (06/24/2021)   PRAPARE - Hydrologist (Medical): No    Lack of Transportation (Non-Medical): No  Physical Activity: Not on file  Stress: Not on file  Social Connections: Not on file  Intimate Partner Violence: Not on file    Physical Exam      Future Appointments  Date Time Provider [place]  10/10/2021 11:45 AM Vickie Epley, MD [facility]  10/14/2021  2:30 PM [place] Callas, NP [facility]  11/04/2021  2:00 PM Kevin Dresser, MD [facility] None     ACTION: Home visit completed

## 2021-09-26 ENCOUNTER — Telehealth (HOSPITAL_COMMUNITY): Payer: Self-pay

## 2021-09-26 ENCOUNTER — Encounter (HOSPITAL_COMMUNITY): Payer: 59

## 2021-09-26 NOTE — Telephone Encounter (Signed)
Spoke to [place], he reports [facility] RN from [place] came out yesterday afternoon to replace PICC. He reports no issues at this time. Call complete.   [name], [place] 09/26/2021

## 2021-09-27 NOTE — Progress Notes (Unsigned)
No ICM remote transmission received for 09/23/2021 and next ICM transmission scheduled for 10/07/2021.

## 2021-09-30 ENCOUNTER — Other Ambulatory Visit: Payer: Self-pay

## 2021-09-30 ENCOUNTER — Emergency Department (HOSPITAL_COMMUNITY)
Admission: EM | Admit: 2021-09-30 | Discharge: 2021-09-30 | Disposition: A | Discharge status: Home / Self Care | Payer: 59 | Attending: Emergency Medicine | Admitting: Emergency Medicine

## 2021-09-30 ENCOUNTER — Encounter (HOSPITAL_COMMUNITY): Payer: Self-pay

## 2021-09-30 ENCOUNTER — Emergency Department: Payer: Self-pay

## 2021-09-30 DIAGNOSIS — Z95828 Presence of other vascular implants and grafts: Secondary | ICD-10-CM | POA: Diagnosis not present

## 2021-09-30 DIAGNOSIS — R21 Rash and other nonspecific skin eruption: Secondary | ICD-10-CM | POA: Insufficient documentation

## 2021-09-30 LAB — CBC WITH DIFFERENTIAL/PLATELET
Abs Immature Granulocytes: 0.01 10*3/uL (ref 0.00–0.07)
Basophils Absolute: 0 10*3/uL (ref 0.0–0.1)
Basophils Relative: 1 %
Eosinophils Absolute: 0.1 10*3/uL (ref 0.0–0.5)
Eosinophils Relative: 2 %
HCT: 38.3 % — ABNORMAL LOW (ref 39.0–52.0)
Hemoglobin: 12.7 g/dL — ABNORMAL LOW (ref 13.0–17.0)
Immature Granulocytes: 0 %
Lymphocytes Relative: 32 %
Lymphs Abs: 1.3 10*3/uL (ref 0.7–4.0)
MCH: 32.3 pg (ref 26.0–34.0)
MCHC: 33.2 g/dL (ref 30.0–36.0)
MCV: 97.5 fL (ref 80.0–100.0)
Monocytes Absolute: 0.6 10*3/uL (ref 0.1–1.0)
Monocytes Relative: 14 %
Neutro Abs: 2.1 10*3/uL (ref 1.7–7.7)
Neutrophils Relative %: 51 %
Platelets: 163 10*3/uL (ref 150–400)
RBC: 3.93 MIL/uL — ABNORMAL LOW (ref 4.22–5.81)
RDW: 13.8 % (ref 11.5–15.5)
WBC: 4.1 10*3/uL (ref 4.0–10.5)
nRBC: 0 % (ref 0.0–0.2)

## 2021-09-30 LAB — BASIC METABOLIC PANEL
Anion gap: 11 (ref 5–15)
BUN: 20 mg/dL (ref 6–20)
CO2: 28 mmol/L (ref 22–32)
Calcium: 8.5 mg/dL — ABNORMAL LOW (ref 8.9–10.3)
Chloride: 100 mmol/L (ref 98–111)
Creatinine, Ser: 1.07 mg/dL (ref 0.61–1.24)
GFR, Estimated: >60 mL/min (ref >60)
Glucose, Bld: 99 mg/dL (ref 70–99)
Potassium: 3.8 mmol/L (ref 3.5–5.1)
Sodium: 139 mmol/L (ref 135–145)

## 2021-09-30 MED ORDER — SODIUM CHLORIDE 0.9% FLUSH: 10.0000 mL | INTRAVENOUS | Status: DC | PRN

## 2021-09-30 MED ORDER — SODIUM CHLORIDE 0.9% FLUSH: 10.0000 mL | Freq: Two times a day (BID) | INTRAVENOUS | Status: DC

## 2021-09-30 MED ORDER — CHLORHEXIDINE GLUCONATE CLOTH 2 % EX PADS
6.0000 | MEDICATED_PAD | Freq: Every day | CUTANEOUS | Status: DC
Start: 2021-09-30 — End: 2021-09-30

## 2021-09-30 NOTE — ED Provider Triage Note (Signed)
Emergency Medicine Provider Triage Evaluation Note  Kevin Cardenas , a 46 y.o. male  was evaluated in triage.  Pt complains of concern for PICC line.  States that he has had this PICC line in place since the end of May and was told that he needed it for 6 weeks.  The home health nurse saw it this morning and was concerned that it was "opening up around it."  They were able to use it today successfully.  Review of Systems  Positive: PICC line concern Negative: Fever  Physical Exam  BP 95/67   Pulse 77   Temp 98.7 F (37.1 C) (Oral)   Resp 14   SpO2 92%  Gen:   Awake, no distress   Resp:  Normal effort  MSK:   Moves extremities without difficulty  Other:  PICC line in place in right upper arm  Medical Decision Making  Medically screening exam initiated at 3:15 PM.  Appropriate orders placed.  Kevin Cardenas was informed that the remainder of the evaluation will be completed by another provider, this initial triage assessment does not replace that evaluation, and the importance of remaining in the ED until their evaluation is complete.     Delia Heady, PA-C 09/30/21 1516

## 2021-09-30 NOTE — ED Notes (Signed)
IV team to assess PICC line.

## 2021-09-30 NOTE — ED Provider Notes (Signed)
[facility] EMERGENCY DEPARTMENT Provider Note   CSN: [id number] Arrival date & time: 09/30/21  1429     History  Chief Complaint  Patient presents with   Vascular Access Problem    Kevin Cardenas is a 46 y.o. male.  Patient presents with concern for PICC line being irritated right upper extremity.  The home health saw him today and was concerned it was opening up around the insertion site.  Patient denies any fevers, spreading redness, vomiting, shortness of breath or new concerns.  Currently asymptomatic.  Minimal redness at the site per patient.  Patient receiving IV antibiotics through it and follows up with infectious disease.  Patient was admitted last month for sepsis.  Patient had PICC line placed at that time and had defibrillator removed due to concern for being colonized.       Home Medications Prior to Admission medications   Medication Sig Start Date End Date Taking? Authorizing Provider  ascorbic acid (VITAMIN C) 500 MG tablet Take 500 mg by mouth daily.    [provider]  carvedilol (COREG) 3.125 MG tablet Take 1 tablet (3.125 mg total) by mouth 2 (two) times daily with a meal. 09/17/21   Milford, Maricela Bo, FNP  ceFAZolin (ANCEF) IVPB Inject 2 g into the vein every 8 (eight) hours. Indication:  Bacteremia First Dose: Yes Last Day of Therapy:  10/17/2021 Labs - Once weekly:  CBC/D and BMP, Labs - Every other week:  ESR and CRP Method of administration: IV Push Method of administration may be changed at the discretion of home infusion pharmacist based upon assessment of the patient and/or caregiver's ability to self-administer the medication ordered. 09/06/21 10/17/21  Nita Sells, MD  dapagliflozin propanediol (FARXIGA) 10 MG TABS tablet Take 1 tablet (10 mg total) by mouth daily. 09/06/21   Nita Sells, MD  digoxin (LANOXIN) 0.125 MG tablet Take 1 tablet (125 mcg total) by mouth daily. 08/19/21   Larey Dresser, MD   fluticasone furoate-vilanterol (BREO ELLIPTA) 100-25 MCG/ACT AEPB Inhale 1 puff into the lungs daily. 07/17/21   Dorna Mai, MD  potassium chloride SA (KLOR-CON M) 20 MEQ tablet Take 1 tablet (20 mEq total) by mouth daily. 08/19/21   Larey Dresser, MD  sacubitril-valsartan (ENTRESTO) 49-51 MG Take 1 tablet by mouth 2 (two) times daily. 07/03/21   Larey Dresser, MD  simvastatin (ZOCOR) 20 MG tablet Take 1 tablet (20 mg total) by mouth at bedtime. 03/01/21   Larey Dresser, MD  spironolactone (ALDACTONE) 25 MG tablet Take 1 tablet (25 mg total) by mouth daily. 09/17/21   Milford, Maricela Bo, FNP  torsemide (DEMADEX) 20 MG tablet Take 1 tablet (20 mg total) by mouth 2 (two) times daily. 09/10/21   Nita Sells, MD      Allergies    Metolazone    Review of Systems   Review of Systems  Constitutional:  Negative for chills and fever.  HENT:  Negative for congestion.   Eyes:  Negative for visual disturbance.  Respiratory:  Negative for shortness of breath.   Cardiovascular:  Negative for chest pain.  Gastrointestinal:  Negative for abdominal pain and vomiting.  Genitourinary:  Negative for dysuria and flank pain.  Musculoskeletal:  Negative for back pain, neck pain and neck stiffness.  Skin:  Positive for rash.  Neurological:  Negative for light-headedness and headaches.    Physical Exam Updated Vital Signs BP 97/64 (BP Location: Left Arm)   Pulse 77  Temp 98.5 F (36.9 C)   Resp 16   Ht _0  (1.676 m)   Wt 106.1 kg   SpO2 95%   BMI 37.77 kg/m  Physical Exam Vitals and nursing note reviewed.  Constitutional:      General: He is not in acute distress.    Appearance: He is well-developed.  HENT:     Head: Normocephalic and atraumatic.     Mouth/Throat:     Mouth: Mucous membranes are moist.  Eyes:     General:        Right eye: No discharge.        Left eye: No discharge.     Conjunctiva/sclera: Conjunctivae normal.  Neck:     Trachea: No tracheal deviation.   Cardiovascular:     Rate and Rhythm: Normal rate.  Pulmonary:     Effort: Pulmonary effort is normal.  Abdominal:     General: There is no distension.  Musculoskeletal:        General: No swelling or tenderness.     Cervical back: Normal range of motion and neck supple. No rigidity.  Skin:    General: Skin is warm.     Capillary Refill: Capillary refill takes less than 2 seconds.     Comments: Patient has minimal area of erythema at previous PICC line site nontender, no induration, no significant warmth.  Reassessment after new PICC line placed, overall well-appearing without signs of significant infection.  No swelling or tenderness to the right upper extremity.  Neurological:     General: No focal deficit present.     Mental Status: He is alert.  Psychiatric:        Mood and Affect: Mood normal.     ED Results / Procedures / Treatments   Labs (all labs ordered are listed, but only abnormal results are displayed) Labs Reviewed  CBC WITH DIFFERENTIAL/PLATELET - Abnormal; Notable for the following components:      Result Value   RBC 3.93 (*)    Hemoglobin 12.7 (*)    HCT 38.3 (*)    All other components within normal limits  BASIC METABOLIC PANEL - Abnormal; Notable for the following components:   Calcium 8.5 (*)    All other components within normal limits  CULTURE, BLOOD (ROUTINE X 2)  CULTURE, BLOOD (ROUTINE X 2)    EKG None  Radiology [place] EKG SITE RITE  Result Date: 09/30/2021 If Site Rite image not attached, placement could not be confirmed due to current cardiac rhythm.   Procedures Procedures    Medications Ordered in ED Medications  sodium chloride flush (NS) 0.9 % injection 10-40 mL (has no administration in time range)  sodium chloride flush (NS) 0.9 % injection 10-40 mL (has no administration in time range)  Chlorhexidine Gluconate Cloth 2 % PADS 6 each (has no administration in time range)    ED Course/ Medical Decision Making/ A&P                            Medical Decision Making  Patient presents for assessment of PICC line that he is currently receiving IV antibiotics through.  Fortunately patient has no signs or symptoms of significant infection at this time.  No signs of cellulitis.  Instructed patient to follow-up closely with primary doctor/infectious disease for details of length of antibiotics.  No signs of blood clot in the right upper extremity.  Patient had blood work ordered and reviewed  independently by myself no signs of significant white blood cell elevation, hemoglobin mild anemia, electrolytes and kidney function unremarkable reviewed.  Patient requesting discharge from triage, I evaluated him in person and discussed follow-up and reasons to return.  Patient stable for discharge at this time.  Patient had new PICC line placed.  Medical records reviewed and discharge summary on 5/30 describing patient was admitted for MSSA sepsis.        Final Clinical Impression(s) / ED Diagnoses Final diagnoses:  S/P PICC central line placement  Rash and nonspecific skin eruption    Rx / DC Orders ED Discharge Orders     None         Elnora Morrison, MD 09/30/21 [id number]

## 2021-09-30 NOTE — Progress Notes (Signed)
Old RUA PICC site is red, tender, and have a small amount of yellow exudate. Another PICC placed on RUA in a different vein, slightly above over old PICC insertion site. PICC is ready to use.

## 2021-09-30 NOTE — Discharge Instructions (Signed)
Continue to take medications and antibiotics as previously prescribed. Follow-up with primary doctor and infectious disease closely. Return for persistent fevers, shortness of breath, spreading redness from PICC line site or new concerns.

## 2021-09-30 NOTE — ED Triage Notes (Signed)
Receiving IV antibiotics via PICC in RUA but picc is now red at insertion site.

## 2021-09-30 NOTE — Progress Notes (Signed)
Peripherally Inserted Central Catheter Placement  The IV Nurse has discussed with the patient and/or persons authorized to consent for the patient, the purpose of this procedure and the potential benefits and risks involved with this procedure.  The benefits include less needle sticks, lab draws from the catheter, and the patient may be discharged home with the catheter. Risks include, but not limited to, infection, bleeding, blood clot (thrombus formation), and puncture of an artery; nerve damage and irregular heartbeat and possibility to perform a PICC exchange if needed/ordered by physician.  Alternatives to this procedure were also discussed.  Bard Power PICC patient education guide, fact sheet on infection prevention and patient information card has been provided to patient /or left at bedside.    PICC Placement Documentation  PICC Single Lumen 02/01/10 Right Basilic 39 cm 0 cm (Active)  Indication for Insertion or Continuance of Line Home intravenous therapies (PICC only) 09/30/21 1700  Exposed Catheter (cm) 0 cm 09/30/21 1700  Site Assessment Clean, Dry, Intact 09/30/21 1700  Line Status Flushed;Blood return noted;Saline locked 09/30/21 1700  Dressing Type Transparent;Securing device 09/30/21 1700  Dressing Status Clean, Dry, Intact;Antimicrobial disc in place 09/30/21 1700  Dressing Change Due 10/07/21 09/30/21 1700       Jule Economy Horton 09/30/2021, 5:50 PM

## 2021-10-01 LAB — LAB REPORT - SCANNED: EGFR: 81

## 2021-10-05 LAB — CULTURE, BLOOD (ROUTINE X 2)
Culture: NO GROWTH
Special Requests: ADEQUATE

## 2021-10-07 ENCOUNTER — Telehealth: Payer: Self-pay | Admitting: Family Medicine

## 2021-10-08 LAB — LAB REPORT - SCANNED: EGFR: 81

## 2021-10-09 ENCOUNTER — Other Ambulatory Visit: Payer: Self-pay

## 2021-10-09 ENCOUNTER — Emergency Department (HOSPITAL_COMMUNITY)
Admission: EM | Admit: 2021-10-09 | Discharge: 2021-10-09 | Disposition: A | Discharge status: Home / Self Care | Payer: 59 | Attending: Emergency Medicine | Admitting: Emergency Medicine

## 2021-10-09 ENCOUNTER — Ambulatory Visit: Payer: 59 | Admitting: Internal Medicine

## 2021-10-09 ENCOUNTER — Encounter (HOSPITAL_COMMUNITY): Payer: Self-pay | Admitting: Pharmacy Technician

## 2021-10-09 DIAGNOSIS — T829XXA Unspecified complication of cardiac and vascular prosthetic device, implant and graft, initial encounter: Secondary | ICD-10-CM

## 2021-10-09 DIAGNOSIS — T82898A Other specified complication of vascular prosthetic devices, implants and grafts, initial encounter: Secondary | ICD-10-CM | POA: Diagnosis present

## 2021-10-09 DIAGNOSIS — D72819 Decreased white blood cell count, unspecified: Secondary | ICD-10-CM | POA: Insufficient documentation

## 2021-10-09 LAB — COMPREHENSIVE METABOLIC PANEL
ALT: 6 U/L (ref 0–44)
AST: 20 U/L (ref 15–41)
Albumin: 3.9 g/dL (ref 3.5–5.0)
Alkaline Phosphatase: 64 U/L (ref 38–126)
Anion gap: 9 (ref 5–15)
BUN: 15 mg/dL (ref 6–20)
CO2: 31 mmol/L (ref 22–32)
Calcium: 9.1 mg/dL (ref 8.9–10.3)
Chloride: 97 mmol/L — ABNORMAL LOW (ref 98–111)
Creatinine, Ser: 1.17 mg/dL (ref 0.61–1.24)
GFR, Estimated: >60 mL/min (ref >60)
Glucose, Bld: 119 mg/dL — ABNORMAL HIGH (ref 70–99)
Potassium: 3.9 mmol/L (ref 3.5–5.1)
Sodium: 137 mmol/L (ref 135–145)
Total Bilirubin: 0.9 mg/dL (ref 0.3–1.2)
Total Protein: 7.4 g/dL (ref 6.5–8.1)

## 2021-10-09 LAB — CBC WITH DIFFERENTIAL/PLATELET
Abs Immature Granulocytes: 0.01 10*3/uL (ref 0.00–0.07)
Basophils Absolute: 0 10*3/uL (ref 0.0–0.1)
Basophils Relative: 0 %
Eosinophils Absolute: 0.1 10*3/uL (ref 0.0–0.5)
Eosinophils Relative: 3 %
HCT: 39 % (ref 39.0–52.0)
Hemoglobin: 12.4 g/dL — ABNORMAL LOW (ref 13.0–17.0)
Immature Granulocytes: 0 %
Lymphocytes Relative: 29 %
Lymphs Abs: 1.1 10*3/uL (ref 0.7–4.0)
MCH: 30.9 pg (ref 26.0–34.0)
MCHC: 31.8 g/dL (ref 30.0–36.0)
MCV: 97.3 fL (ref 80.0–100.0)
Monocytes Absolute: 0.4 10*3/uL (ref 0.1–1.0)
Monocytes Relative: 12 %
Neutro Abs: 2 10*3/uL (ref 1.7–7.7)
Neutrophils Relative %: 56 %
Platelets: 158 10*3/uL (ref 150–400)
RBC: 4.01 MIL/uL — ABNORMAL LOW (ref 4.22–5.81)
RDW: 13.2 % (ref 11.5–15.5)
WBC: 3.6 10*3/uL — ABNORMAL LOW (ref 4.0–10.5)
nRBC: 0 % (ref 0.0–0.2)

## 2021-10-09 MED ORDER — SODIUM CHLORIDE 0.9% FLUSH: 10.0000 mL | Freq: Two times a day (BID) | INTRAVENOUS | Status: DC

## 2021-10-09 MED ORDER — SODIUM CHLORIDE 0.9% FLUSH: 10.0000 mL | INTRAVENOUS | Status: DC | PRN

## 2021-10-09 NOTE — Discharge Instructions (Signed)
Please be sure to follow-up with your infectious disease physician.  Return here for concerning changes in your condition.

## 2021-10-09 NOTE — Progress Notes (Signed)
RUA PICC site has induration and tender to touch with 2 cm out. MD ordered to d/c PICC and inserted midline to LUA 18G x 8cm. Patient stated feeling better on the RUA after pulling PICC out.

## 2021-10-09 NOTE — ED Notes (Signed)
IV pulling PICC and placing Midline

## 2021-10-09 NOTE — ED Provider Notes (Signed)
[facility] EMERGENCY DEPARTMENT Provider Note   CSN: [id number] Arrival date & time: 10/09/21  0848     History  Chief Complaint  Patient presents with   Vascular Access Problem    Kevin Cardenas is a 46 y.o. male.  HPI Patient with ongoing need for IV antibiotics due to his recent MSSA bacteremia now presents with concern for swelling, pain around his right PICC line site.  He had a similar presentation about 1 week ago, notes that since having replacement at that time he has been doing generally well.  Until today he has been receiving his IV medication regularly.  However, today with abnormality at the site he did not receive his medication.  He states that he feels generally well, however, with no fever, vomiting or other new complaints.    Home Medications Prior to Admission medications   Medication Sig Start Date End Date Taking? Authorizing Provider  ascorbic acid (VITAMIN C) 500 MG tablet Take 500 mg by mouth daily.    [provider]  carvedilol (COREG) 3.125 MG tablet Take 1 tablet (3.125 mg total) by mouth 2 (two) times daily with a meal. 09/17/21   Milford, Maricela Bo, FNP  ceFAZolin (ANCEF) IVPB Inject 2 g into the vein every 8 (eight) hours. Indication:  Bacteremia First Dose: Yes Last Day of Therapy:  10/17/2021 Labs - Once weekly:  CBC/D and BMP, Labs - Every other week:  ESR and CRP Method of administration: IV Push Method of administration may be changed at the discretion of home infusion pharmacist based upon assessment of the patient and/or caregiver's ability to self-administer the medication ordered. 09/06/21 10/17/21  Nita Sells, MD  dapagliflozin propanediol (FARXIGA) 10 MG TABS tablet Take 1 tablet (10 mg total) by mouth daily. 09/06/21   Nita Sells, MD  digoxin (LANOXIN) 0.125 MG tablet Take 1 tablet (125 mcg total) by mouth daily. 08/19/21   Larey Dresser, MD  fluticasone furoate-vilanterol (BREO ELLIPTA) 100-25  MCG/ACT AEPB Inhale 1 puff into the lungs daily. 07/17/21   Dorna Mai, MD  potassium chloride SA (KLOR-CON M) 20 MEQ tablet Take 1 tablet (20 mEq total) by mouth daily. 08/19/21   Larey Dresser, MD  sacubitril-valsartan (ENTRESTO) 49-51 MG Take 1 tablet by mouth 2 (two) times daily. 07/03/21   Larey Dresser, MD  simvastatin (ZOCOR) 20 MG tablet Take 1 tablet (20 mg total) by mouth at bedtime. 03/01/21   Larey Dresser, MD  spironolactone (ALDACTONE) 25 MG tablet Take 1 tablet (25 mg total) by mouth daily. 09/17/21   Milford, Maricela Bo, FNP  torsemide (DEMADEX) 20 MG tablet Take 1 tablet (20 mg total) by mouth 2 (two) times daily. 09/10/21   Nita Sells, MD      Allergies    Metolazone    Review of Systems   Review of Systems  All other systems reviewed and are negative.   Physical Exam Updated Vital Signs BP (!) 132/112 (BP Location: Left Arm)   Pulse 78   Temp 98.4 F (36.9 C) (Oral)   Resp 16   SpO2 96%  Physical Exam Vitals and nursing note reviewed.  Constitutional:      General: He is not in acute distress.    Appearance: He is well-developed.  HENT:     Head: Normocephalic and atraumatic.  Eyes:     Conjunctiva/sclera: Conjunctivae normal.  Cardiovascular:     Rate and Rhythm: Normal rate and regular rhythm.  Pulmonary:  Effort: Pulmonary effort is normal. No respiratory distress.     Breath sounds: No stridor.  Abdominal:     General: There is no distension.  Skin:    General: Skin is warm and dry.       Neurological:     Mental Status: He is alert and oriented to person, place, and time.     ED Results / Procedures / Treatments   Labs (all labs ordered are listed, but only abnormal results are displayed) Labs Reviewed  COMPREHENSIVE METABOLIC PANEL - Abnormal; Notable for the following components:      Result Value   Chloride 97 (*)    Glucose, Bld 119 (*)    All other components within normal limits  CBC WITH DIFFERENTIAL/PLATELET -  Abnormal; Notable for the following components:   WBC 3.6 (*)    RBC 4.01 (*)    Hemoglobin 12.4 (*)    All other components within normal limits    EKG None  Radiology No results found.  Procedures Procedures    Medications Ordered in ED Medications  sodium chloride flush (NS) 0.9 % injection 10-40 mL (has no administration in time range)  sodium chloride flush (NS) 0.9 % injection 10-40 mL (has no administration in time range)    ED Course/ Medical Decision Making/ A&P  This patient with a Hx of MSSA bacteremia presents to the ED for concern of swelling, pain around PICC line insertion site, this involves an extensive number of treatment options, and is a complaint that carries with it a high risk of complications and morbidity.    The differential diagnosis includes infected catheter, thrombosis, less likely progression of bacteremia/sepsis   Social Determinants of Health:  No limits  Additional history obtained:  Additional history and/or information obtained from Kevin Cardenas at bedside, notable for HPI.  Additional details on chart review from evaluation 1 week ago requiring PICC line nurse assessment, placement   After the initial evaluation, orders, including: Labs were initiated.   On repeat evaluation of the patient stayed the same  Lab Tests:  I personally interpreted labs.  The pertinent results include: Mild leukopenia, with unremarkable electrolytes    Consultations Obtained:  I requested consultation with the [facility] line nurse,  and discussed lab and imaging findings as well as pertinent plan - they recommend: PICC line removed, new midline catheter placed  Dispostion / Final MDM:  After consideration of the diagnostic results and the patient's response to treatment, patient with no evidence for bacteremia, sepsis, no distress had replacement of needed central access for ongoing IV therapy for MSSA bacteremia, is appropriate for discharge with outpatient  follow-up.   Final Clinical Impression(s) / ED Diagnoses Final diagnoses:  Complication associated with peripherally inserted central catheter (PICC), initial encounter    Rx / DC Orders ED Discharge Orders     None         Carmin Muskrat, MD 10/09/21 1134

## 2021-10-09 NOTE — ED Triage Notes (Signed)
Pt here with reports of swelling around picc line along with some bleeding at picc line site onset this morning.

## 2021-10-10 ENCOUNTER — Telehealth: Payer: Self-pay

## 2021-10-10 ENCOUNTER — Encounter: Payer: Self-pay | Admitting: Cardiology

## 2021-10-10 ENCOUNTER — Ambulatory Visit (INDEPENDENT_AMBULATORY_CARE_PROVIDER_SITE_OTHER): Payer: 59 | Admitting: Cardiology

## 2021-10-10 ENCOUNTER — Telehealth (HOSPITAL_COMMUNITY): Payer: Self-pay

## 2021-10-10 VITALS — BP 90/58 | HR 88 | Ht 66.0 in | Wt 243.0 lb

## 2021-10-10 DIAGNOSIS — I428 Other cardiomyopathies: Secondary | ICD-10-CM

## 2021-10-10 DIAGNOSIS — T827XXD Infection and inflammatory reaction due to other cardiac and vascular devices, implants and grafts, subsequent encounter: Secondary | ICD-10-CM

## 2021-10-10 DIAGNOSIS — B9561 Methicillin susceptible Staphylococcus aureus infection as the cause of diseases classified elsewhere: Secondary | ICD-10-CM

## 2021-10-10 DIAGNOSIS — I5020 Unspecified systolic (congestive) heart failure: Secondary | ICD-10-CM

## 2021-10-10 DIAGNOSIS — Q210 Ventricular septal defect: Secondary | ICD-10-CM | POA: Diagnosis not present

## 2021-10-10 DIAGNOSIS — R7881 Bacteremia: Secondary | ICD-10-CM | POA: Diagnosis not present

## 2021-10-10 NOTE — Telephone Encounter (Signed)
I spoke with the patient about missed ICM transmission and he informed me that he was explanted. I told him I will let Sharman Cheek know.

## 2021-10-10 NOTE — Addendum Note (Signed)
Addended by: Saddie Benders E on: 10/10/2021 12:30 PM   Modules accepted: Orders

## 2021-10-10 NOTE — Patient Instructions (Addendum)
Medication Instructions:  Your physician recommends that you continue on your current medications as directed. Please refer to the Current Medication list given to you today. *If you need a refill on your cardiac medications before your next appointment, please call your pharmacy*  Lab Work: CBC If you have labs (blood work) drawn today and your tests are completely normal, you will receive your results only by: [place] (if you have MyChart) OR A paper copy in the mail If you have any lab test that is abnormal or we need to change your treatment, we will call you to review the results.  Testing/Procedures: None.  Follow-Up: At [facility], you and your health needs are our priority.  As part of our continuing mission to provide you with exceptional heart care, we have created designated Provider Care Teams.  These Care Teams include your primary Cardiologist (physician) and Advanced Practice Providers (APPs -  Physician Assistants and Nurse Practitioners) who all work together to provide you with the care you need, when you need it.  Your physician wants you to follow-up in: 6 months with one of the following Advanced Practice Providers on your designated Care Team:    Tommye Standard, [place] Legrand Como "Jonni Sanger" [place], [place]   We recommend signing up for the patient portal called "MyChart".  Sign up information is provided on this After Visit Summary.  MyChart is used to connect with patients for Virtual Visits (Telemedicine).  Patients are able to view lab/test results, encounter notes, upcoming appointments, etc.  Non-urgent messages can be sent to your provider as well.   To learn more about what you can do with MyChart, go to [email or web address].    Any Other Special Instructions Will Be Listed Below (If Applicable).     You are scheduled for Cardiac MRI on ______________. Please arrive for your appointment at ______________ ( arrive 30-45 minutes prior to test start time).  ?  [facility] [address] [place], [place] [address] [phone] Please take advantage of the free valet parking available at the MAIN entrance (A entrance).  Proceed to the [facility] Radiology Department (First Floor) for check-in.   [facility] [address] [place], [place] [address] [phone] Please take advantage of the free valet parking available at the MAIN entrance. Proceed to [facility] registration for check-in (first floor).  Magnetic resonance imaging (MRI) is a painless test that produces images of the inside of the body without using Xrays.  During an MRI, strong magnets and radio waves work together in a [employer] officer, political party to form detailed images.   MRI images may provide more details about a medical condition than X-rays, CT scans, and ultrasounds can provide.  You may be given earphones to listen for instructions.  You may eat a light breakfast and take medications as ordered with the exception of HCTZ (fluid pill, other). Please avoid stimulants for 12 hr prior to test. (Ie. Caffeine, nicotine, chocolate, or antihistamine medications)  If a contrast material will be used, an IV will be inserted into one of your veins. Contrast material will be injected into your IV. It will leave your body through your urine within a day. You may be told to drink plenty of fluids to help flush the contrast material out of your system.  You will be asked to remove all metal, including: Watch, jewelry, and other metal objects including hearing aids, hair pieces and dentures. Also wearable glucose monitoring systems (ie. Freestyle  Libre and [employer]) (Braces and fillings normally are not a problem.)   TEST WILL TAKE APPROXIMATELY 1 HOUR  PLEASE NOTIFY SCHEDULING AT LEAST 24 HOURS IN ADVANCE IF YOU ARE UNABLE TO KEEP YOUR APPOINTMENT. [phone]  Please call Marchia Bond, cardiac imaging nurse navigator with any  questions/concerns. Marchia Bond RN Navigator Cardiac Imaging Gordy Clement RN Navigator Cardiac Imaging [facility] Heart [facility] V[facility] Services [phone] Office

## 2021-10-10 NOTE — Telephone Encounter (Signed)
Called Kevin Cardenas to check in- he reports he is feeling well with no complaints. He reports he was seen by Cardiology today and has no current concerns or needs. I told him I would check in for home visit in two weeks, he agreed with plan. Call complete.    [name], [place] 10/10/2021

## 2021-10-10 NOTE — Progress Notes (Signed)
Electrophysiology Office Note:    Date:  10/10/2021   ID:  Kevin Cardenas, DOB Jul 15, 1975, MRN [id number]  PCP:  Dorna Mai, MD  [facility] Cardiologist:  [name], MD  [facility] Electrophysiologist:  Vickie Epley, MD   Referring MD: Dorna Mai, MD   Chief Complaint: Follow-up  History of Present Illness:    Kevin Cardenas is a 46 y.o. male who presents for follow-up of ICD removal on 09/06/21 ([employer] ICD previously placed 01/2015). Their medical history includes non-ischemic cardiomyopathy, chronic systolic CHF, VSD (repaired at age 13), diabetes, hypertension, and OSA.  He had been admitted to the hospital 08/2021 with sepsis due to RLE cellulitis. He underwent TEE showing 0.5 x 0.9 cm vegetation on ICD lead, no valvular vegetation noted. Subsequently had ICD extraction, with plans for IV cefazolin via PICC for 6 weeks. He was discharged with LifeVest in place.  He followed up with Allena Katz, FNP on 09/17/21 and reported dyspnea with climbing stairs. At that visit he was not wearing the LifeVest as it keeps alarming. He was felt to be a good candidate for subcutaneous defibrillator once his endocarditis had been fully treated.  Today, he is feeling okay. He is wearing his LifeVest.  They deny any palpitations, chest pain, shortness of breath, or peripheral edema. No lightheadedness, headaches, syncope, orthopnea, or PND.     Past Medical History:  Diagnosis Date   AICD (automatic cardioverter/defibrillator) present    Chronic systolic CHF (congestive heart failure) (HCC)    EF 25% by echo 06/2018   Diabetes mellitus    Hypertension    Non-ischemic cardiomyopathy ([place])    Obstructive sleep apnea    VSD (ventricular septal defect) 1978   repaired at age 74    Past Surgical History:  Procedure Laterality Date   EP IMPLANTABLE DEVICE N/A 01/19/2015   Procedure: ICD Implant;  Surgeon: Deboraha Sprang, MD;  Location: [place] CV LAB;  Service:  Cardiovascular;  Laterality: N/A;   ICD LEAD REMOVAL  09/05/2021   Procedure: ICD LASER LEAD REMOVAL;  Surgeon: Vickie Epley, MD;  Location: [place];  Service: Cardiovascular;;   IMPLANTABLE CARDIOVERTER DEFIBRILLATOR (ICD) GENERATOR CHANGE N/A 09/05/2021   Procedure: DEFIBRILLATOR GENERATOR REMOVAL;  Surgeon: Vickie Epley, MD;  Location: [place];  Service: Cardiovascular;  Laterality: N/A;   RIGHT HEART CATHETERIZATION N/A 08/03/2014   Procedure: RIGHT HEART CATH;  Surgeon: Larey Dresser, MD;  Location: [facility] CATH LAB;  Service: Cardiovascular;  Laterality: N/A;   RIGHT/LEFT HEART CATH AND CORONARY ANGIOGRAPHY N/A 06/24/2021   Procedure: RIGHT/LEFT HEART CATH AND CORONARY ANGIOGRAPHY;  Surgeon: Larey Dresser, MD;  Location: [place] CV LAB;  Service: Cardiovascular;  Laterality: N/A;   TEE WITHOUT CARDIOVERSION N/A 09/04/2021   Procedure: TRANSESOPHAGEAL ECHOCARDIOGRAM (TEE);  Surgeon: Larey Dresser, MD;  Location: [facility] ENDOSCOPY;  Service: Cardiovascular;  Laterality: N/A;   TEE WITHOUT CARDIOVERSION N/A 09/05/2021   Procedure: TRANSESOPHAGEAL ECHOCARDIOGRAM (TEE);  Surgeon: Vickie Epley, MD;  Location: [facility] OR;  Service: Cardiovascular;  Laterality: N/A;   VSD REPAIR      Current Medications: Current Meds  Medication Sig   ascorbic acid (VITAMIN C) 500 MG tablet Take 500 mg by mouth daily.   carvedilol (COREG) 3.125 MG tablet Take 1 tablet (3.125 mg total) by mouth 2 (two) times daily with a meal.   ceFAZolin (ANCEF) IVPB Inject 2 g into the vein every 8 (eight) hours. Indication:  Bacteremia First Dose:  Yes Last Day of Therapy:  10/17/2021 Labs - Once weekly:  CBC/D and BMP, Labs - Every other week:  ESR and CRP Method of administration: IV Push Method of administration may be changed at the discretion of home infusion pharmacist based upon assessment of the patient and/or caregiver's ability to self-administer the medication ordered.   dapagliflozin propanediol (FARXIGA) 10  MG TABS tablet Take 1 tablet (10 mg total) by mouth daily.   digoxin (LANOXIN) 0.125 MG tablet Take 1 tablet (125 mcg total) by mouth daily.   fluticasone furoate-vilanterol (BREO ELLIPTA) 100-25 MCG/ACT AEPB Inhale 1 puff into the lungs daily.   potassium chloride SA (KLOR-CON M) 20 MEQ tablet Take 1 tablet (20 mEq total) by mouth daily.   sacubitril-valsartan (ENTRESTO) 49-51 MG Take 1 tablet by mouth 2 (two) times daily.   simvastatin (ZOCOR) 20 MG tablet Take 1 tablet (20 mg total) by mouth at bedtime.   spironolactone (ALDACTONE) 25 MG tablet Take 1 tablet (25 mg total) by mouth daily.   torsemide (DEMADEX) 20 MG tablet Take 1 tablet (20 mg total) by mouth 2 (two) times daily.     Allergies:   Metolazone   Social History   Socioeconomic History   Marital status: Single    Spouse name: Recently Married    Number of children: 0   Years of education: Not on file   Highest education level: Not on file  Occupational History   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   Tobacco Use   Smoking status: Former    Packs/day: 0.30    Years: 15.00    Total pack years: 4.50    Types: Cigarettes    Quit date: 04/08/2011    Years since quitting: 10.5   Smokeless tobacco: Never   Tobacco comments:    Smokes depends on who is around him.    Substance and Sexual Activity   Alcohol use: No    Alcohol/week: 0.0 standard drinks of alcohol    Comment: Rarely    Drug use: No   Sexual activity: Not Currently  Other Topics Concern   Not on file  Social History Narrative   Caffienated drinks-no   Seat belt use often-no   Regular Exercise-   Smoke alarm in the home-yes   Firearms/guns in the home-no   History of physical abuse-no               Social Determinants of Health   Financial Resource Strain: Not on file  Food Insecurity: No Food Insecurity (06/24/2021)   Hunger Vital Sign    Worried About Running Out of Food in the Last  Year: Never true    Ran Out of Food in the Last Year: Never true  Transportation Needs: No Transportation Needs (06/24/2021)   PRAPARE - Hydrologist (Medical): No    Lack of Transportation (Non-Medical): No  Physical Activity: Not on file  Stress: Not on file  Social Connections: Not on file     Family History: The patient's family history includes Diabetes in his maternal grandfather and paternal grandmother; Lung cancer in his father; Stroke in his father and mother. There is no history of Cancer, Alcohol abuse, Drug abuse, Early death, Heart disease, Hyperlipidemia, Hypertension, or Kidney disease.  ROS:   Please see the history of present illness.    All other systems reviewed and are negative.  EKGs/Labs/Other Studies Reviewed:  The following studies were reviewed today:  09/04/2021  Echo TEE:  1. No residual VSD noted. Left ventricular ejection fraction, by  estimation, is 35 to 40%. The left ventricle has moderately decreased  function. The left ventricle demonstrates global hypokinesis. There is  mild left ventricular hypertrophy.   2. There is a 0.5 x 0.9 cm vegetation attached to the ICD lead as it  passes through the right atrium. Right ventricular systolic function is  moderately reduced. The right ventricular size is mildly enlarged.   3. Left atrial size was mildly dilated. No left atrial/left atrial  appendage thrombus was detected.   4. Right atrial size was mildly dilated.   5. No MV vegetation. The mitral valve is normal in structure. No evidence  of mitral valve regurgitation. No evidence of mitral stenosis.   6. Peak RV-RA gradient 17 mmHg. No TV vegetation.   7. No aortic valve vegetation. The aortic valve is tricuspid. Aortic  valve regurgitation is not visualized. No aortic stenosis is present.   8. Peak pulmonic valve gradient 13 mmHg. No PV vegetation. Mild pulmonic  stenosis.   9. PFO by color doppler.   Right/Left Heart  Cath  06/24/2021: 1. Normal coronaries.  2. Normal filling pressures.  3. Normal cardiac output.  4. Mild pulmonary hypertension, suspect related to OHS/OSA.    EKG:   EKG is personally reviewed.  10/10/2021 - EKG was not ordered.    Recent Labs: 06/19/2021: TSH 0.984 09/09/2021: Magnesium 2.4 09/17/2021: B Natriuretic Peptide 8.6 10/09/2021: ALT 6; BUN 15; Creatinine, Ser 1.17; Hemoglobin 12.4; Platelets 158; Potassium 3.9; Sodium 137   Recent Lipid Panel    Component Value Date/Time   CHOL 128 04/17/2021 1047   TRIG 45 04/17/2021 1047   HDL 43 04/17/2021 1047   CHOLHDL 3.0 04/17/2021 1047   CHOLHDL 3.5 06/15/2018 1116   VLDL 19 06/15/2018 1116   LDLCALC 74 04/17/2021 1047    Physical Exam:    VS:  BP (!) 90/58 (BP Location: Right Arm, Patient Position: Sitting, Cuff Size: Large)   Pulse 88   Ht _0  (1.676 m)   Wt 243 lb (110.2 kg)   BMI 39.22 kg/m     Wt Readings from Last 3 Encounters:  10/10/21 243 lb (110.2 kg)  09/30/21 234 lb (106.1 kg)  09/25/21 234 lb (106.1 kg)     GEN: Well nourished, well developed in no acute distress HEENT: Normal NECK: No JVD; No carotid bruits LYMPHATICS: No lymphadenopathy CARDIAC: RRR, no murmurs, rubs, gallops; Device removal incision well healed. RESPIRATORY:  Clear to auscultation without rales, wheezing or rhonchi  ABDOMEN: Soft, non-tender, non-distended MUSCULOSKELETAL:  No edema; No deformity  SKIN: Warm and dry NEUROLOGIC:  Alert and oriented x 3 PSYCHIATRIC:  Normal affect  Left upper extremity midline site doing okay under bandage.     ASSESSMENT:    1. HFrEF (heart failure with reduced ejection fraction) ([place])   2. VSD (ventricular septal defect)   3. Non-ischemic cardiomyopathy ([place])   4. MSSA bacteremia   5. Infection of implantable cardioverter-defibrillator (ICD) lead, subsequent encounter    PLAN:    In order of problems listed above:  #Chronic systolic heart failure #Nonischemic cardiomyopathy NYHA  class II.  Warm and dry on exam today.  On Coreg, digoxin, Farxiga, Entresto, spironolactone and torsemide.  He is wearing a LifeVest.  Now that he is free of the device, I am planning to perform a cardiac MRI to reassess the LV function.  If the LV function is less than 35%, favor proceeding with subcutaneous ICD implant.  I did discuss the subcutaneous ICD in detail during today's appointment and he wishes to proceed if indicated.  If ejection fraction is greater than 35%, we will continue medical therapy and discontinue LifeVest.  I will put a backstop appointment in 6 months for now with an APP.  We will plan to reach out after the cardiac MRI is completed.  The patient has a nonischemic CM, NYHA Class II and prior ICD now explanted 2/2 infection.   Risks, benefits, alternatives to ICD implantation were discussed in detail with the patient today. The patient understands that the risks include but are not limited to bleeding, infection, pneumothorax, perforation, tamponade, vascular damage, renal failure, MI, stroke, death, inappropriate shocks, and lead dislodgement and wishes to proceed if indicated.    #MSSA bacteremia #CIED infection Doing well.  Planning to complete his IV antibiotics in the next week or so.  Appreciate infectious disease assistance.   Medication Adjustments/Labs and Tests Ordered: Current medicines are reviewed at length with the patient today.  Concerns regarding medicines are outlined above.  No orders of the defined types were placed in this encounter.  No orders of the defined types were placed in this encounter.   I,Mathew Stumpf,acting as a Education administrator for Vickie Epley, MD.,have documented all relevant documentation on the behalf of Vickie Epley, MD,as directed by  Vickie Epley, MD while in the presence of Vickie Epley, MD.  I, Vickie Epley, MD, have reviewed all documentation for this visit. The documentation on 10/10/21 for the exam,  diagnosis, procedures, and orders are all accurate and complete.   Signed, Hilton Cork. Quentin Ore, MD, [facility], [facility] 10/10/2021 12:00 PM    Electrophysiology [facility]

## 2021-10-11 NOTE — Telephone Encounter (Signed)
Please see if you can schedule patient  for appt sooner. Melene Plan   Copied from [employer] [id number]. Topic: Appointment Scheduling - Scheduling Inquiry for Clinic >> Oct 11, 2021 10:10 AM Sabas Sous wrote: Reason for CRM: Joelene Millin Nurse Case Manager called requesting for patient to be worked in within the next 2 weeks or 30 days at most for a hosp fu. Best contact: [phone] (pt)

## 2021-10-14 ENCOUNTER — Encounter: Payer: Self-pay | Admitting: Infectious Diseases

## 2021-10-14 ENCOUNTER — Other Ambulatory Visit: Payer: Self-pay

## 2021-10-14 ENCOUNTER — Ambulatory Visit (INDEPENDENT_AMBULATORY_CARE_PROVIDER_SITE_OTHER): Payer: 59 | Admitting: Infectious Diseases

## 2021-10-14 VITALS — BP 88/56 | HR 80 | Temp 98.1°F | Wt 243.0 lb

## 2021-10-14 DIAGNOSIS — Z5181 Encounter for therapeutic drug level monitoring: Secondary | ICD-10-CM

## 2021-10-14 DIAGNOSIS — T827XXA Infection and inflammatory reaction due to other cardiac and vascular devices, implants and grafts, initial encounter: Secondary | ICD-10-CM | POA: Diagnosis not present

## 2021-10-14 DIAGNOSIS — T827XXD Infection and inflammatory reaction due to other cardiac and vascular devices, implants and grafts, subsequent encounter: Secondary | ICD-10-CM

## 2021-10-14 DIAGNOSIS — Z452 Encounter for adjustment and management of vascular access device: Secondary | ICD-10-CM | POA: Diagnosis not present

## 2021-10-14 NOTE — Assessment & Plan Note (Signed)
Kevin Cardenas has done well now nearing his 6 weeks of recommended treatment for vascular device infection following extraction. He has a cardiac MRI pending per EP team to further determine need for replacement. Will stop antibiotics after 7/6 as planned. No need for suppression as he has undergone curative treatment for this. Will defer to EP for surveillance blood cultures pending further care recs re: re-implantation of device. These are typically low yield in the absence of relapsing symptoms after we stop abx.  We discussed this today and he will notify us if he has any changes after stopping antibiotics that are concerning to him.

## 2021-10-14 NOTE — Assessment & Plan Note (Signed)
Site unremarkable, well maintained and functioning as expected. Continue care and maintenance until planned end of IV antibiotics. Home Health team to remove at completion.   

## 2021-10-14 NOTE — Patient Instructions (Signed)
All is coming along nicely for you! Continue giving yourself the antibiotics through Thursday then your home health team can come out to pull your picc line out.   No need to see [place] again since your device is out. Good luck with your heart MRI and I hope you can continue to avoid having a device in!

## 2021-10-14 NOTE — Progress Notes (Signed)
Patient: Kevin Cardenas  DOB: Aug 26, 1975 MRN: [id number] PCP: Dorna Mai, MD    Subjective:   Chief Complaint  Patient presents with   [facility] follow up MSSA AICD lead infection s/p extraction.     HPI: Kevin Cardenas is a 46 y.o. male. Hospitalized at [facility] with MSSA bacteremia due to infected AICD lead. Underwent TEE showing 0.5 x 0.9 cm vegetation on the ICD lead, no valvular vegetation noted. System was removed 09/05/21. PICC placed and plans to continue IV cefazolin for 6 weeks to treat infection. No plans for re-implantation at this time and has a pending cardiac MRI to re-evaluate LV function.   No symptoms with SBP in the upper 80s/low 90s. Typically where he runs normally at home. Has had close follow up with cardiology and EP.   Tolerating IV cefazolin well without any side effects. Injecting correctly 3 times a day without any missed doses. Incision healing well with steri strips falling off. RLL wound is crusted over and not draining - still has hospital dressing on this.   PICC dressing changed yesterday. PICC line is without pain, drainage or erythema and is well maintained by [facility] Team. No swelling or altered sensation in affected distal extremity.    Review of Systems  Constitutional:  Negative for chills, diaphoresis and fever.  Respiratory:  Negative for cough and shortness of breath.   Cardiovascular:  Negative for chest pain.  Gastrointestinal:  Negative for abdominal pain, diarrhea, nausea and vomiting.  Skin:  Negative for rash.  Neurological:  Negative for dizziness and headaches.    Past Medical History:  Diagnosis Date   AICD (automatic cardioverter/defibrillator) present    Chronic systolic CHF (congestive heart failure) (HCC)    EF 25% by echo 06/2018   Diabetes mellitus    Hypertension    Non-ischemic cardiomyopathy ([place])    Obstructive sleep apnea    VSD (ventricular septal defect) 1978   repaired at age 28     Outpatient Medications Prior to Visit  Medication Sig Dispense Refill   ascorbic acid (VITAMIN C) 500 MG tablet Take 500 mg by mouth daily.     carvedilol (COREG) 3.125 MG tablet Take 1 tablet (3.125 mg total) by mouth 2 (two) times daily with a meal. 60 tablet 1   ceFAZolin (ANCEF) IVPB Inject 2 g into the vein every 8 (eight) hours. Indication:  Bacteremia First Dose: Yes Last Day of Therapy:  10/17/2021 Labs - Once weekly:  CBC/D and BMP, Labs - Every other week:  ESR and CRP Method of administration: IV Push Method of administration may be changed at the discretion of home infusion pharmacist based upon assessment of the patient and/or caregiver's ability to self-administer the medication ordered. 123 Units 0   dapagliflozin propanediol (FARXIGA) 10 MG TABS tablet Take 1 tablet (10 mg total) by mouth daily. 90 tablet 3   digoxin (LANOXIN) 0.125 MG tablet Take 1 tablet (125 mcg total) by mouth daily. 90 tablet 2   fluticasone furoate-vilanterol (BREO ELLIPTA) 100-25 MCG/ACT AEPB Inhale 1 puff into the lungs daily. 60 each 11   potassium chloride SA (KLOR-CON M) 20 MEQ tablet Take 1 tablet (20 mEq total) by mouth daily. 90 tablet 2   sacubitril-valsartan (ENTRESTO) 49-51 MG Take 1 tablet by mouth 2 (two) times daily. 180 tablet 3   simvastatin (ZOCOR) 20 MG tablet Take 1 tablet (20 mg total) by mouth at bedtime. 90 tablet 3   spironolactone (ALDACTONE)  25 MG tablet Take 1 tablet (25 mg total) by mouth daily. 30 tablet 1   torsemide (DEMADEX) 20 MG tablet Take 1 tablet (20 mg total) by mouth 2 (two) times daily. 60 tablet 1   No facility-administered medications prior to visit.     Allergies  Allergen Reactions   Metolazone Other (See Comments)    "Dried up my kidneys- took only one tablet"    Social History   Tobacco Use   Smoking status: Former    Packs/day: 0.30    Years: 15.00    Total pack years: 4.50    Types: Cigarettes    Quit date: 04/08/2011    Years since  quitting: 10.5   Smokeless tobacco: Never   Tobacco comments:    Smokes depends on who is around him.    Substance Use Topics   Alcohol use: No    Alcohol/week: 0.0 standard drinks of alcohol    Comment: Rarely    Drug use: No    Family History  Problem Relation Age of Onset   Lung cancer Father    Stroke Father    Stroke Mother    Diabetes Maternal Grandfather    Diabetes Paternal Grandmother    Cancer Neg Hx    Alcohol abuse Neg Hx    Drug abuse Neg Hx    Early death Neg Hx    Heart disease Neg Hx    Hyperlipidemia Neg Hx    Hypertension Neg Hx    Kidney disease Neg Hx     Objective:   Vitals:   10/14/21 1426  BP: (!) 88/56  Pulse: 80  Temp: 98.1 F (36.7 C)  TempSrc: Oral  Weight: 243 lb (110.2 kg)   Body mass index is 39.22 kg/m.  Physical Exam Vitals reviewed.  Constitutional:      Appearance: He is well-developed.     Comments: Seated comfortably in chair during visit.   HENT:     Mouth/Throat:     Dentition: Normal dentition. No dental abscesses.  Cardiovascular:     Rate and Rhythm: Normal rate and regular rhythm.     Heart sounds: Normal heart sounds.  Pulmonary:     Effort: Pulmonary effort is normal.     Breath sounds: Normal breath sounds.  Abdominal:     General: There is no distension.     Palpations: Abdomen is soft.     Tenderness: There is no abdominal tenderness.  Lymphadenopathy:     Cervical: No cervical adenopathy.  Skin:    General: Skin is warm and dry.     Findings: No rash.  Neurological:     Mental Status: He is alert and oriented to person, place, and time.  Psychiatric:        Judgment: Judgment normal.     Comments: In good spirits today and engaged in care discussion.    LUE PICC line - clean/dry dressing. Insertion site w/o erythema, tenderness, drainage, cording or distal swelling of affected extremity     Lab Results: Lab Results  Component Value Date   WBC 3.6 (L) 10/09/2021   HGB 12.4 (L) 10/09/2021    HCT 39.0 10/09/2021   MCV 97.3 10/09/2021   PLT 158 10/09/2021    Lab Results  Component Value Date   CREATININE 1.17 10/09/2021   BUN 15 10/09/2021   NA 137 10/09/2021   K 3.9 10/09/2021   CL 97 (L) 10/09/2021   CO2 31 10/09/2021    Lab Results  Component Value Date   ALT 6 10/09/2021   AST 20 10/09/2021   ALKPHOS 64 10/09/2021   BILITOT 0.9 10/09/2021     Assessment & Plan:   Problem List Items Addressed This Visit       Unprioritized   Aicd lead infection ([place]) - Primary    Mr. Stefanski has done well now nearing his 6 weeks of recommended treatment for vascular device infection following extraction. He has a cardiac MRI pending per EP team to further determine need for replacement. Will stop antibiotics after 7/6 as planned. No need for suppression as he has undergone curative treatment for this. Will defer to EP for surveillance blood cultures pending further care recs re: re-implantation of device. These are typically low yield in the absence of relapsing symptoms after we stop abx.  We discussed this today and he will notify us if he has any changes after stopping antibiotics that are concerning to him.       Medication monitoring encounter    All OPAT lab work reviewed. WBC is mildly decreased at 3.6 - likely due to prolonged cephalosporin. He will be stopping in 3 days time and would expect return back to normal levels.       PICC (peripherally inserted central catheter) in place    Site unremarkable, well maintained and functioning as expected. Continue care and maintenance until planned end of IV antibiotics. Home Health team to remove at completion.         Janene Madeira, MSN, NP-C [facility] [place] Pager: [phone] Office: [phone]  10/14/21  2:58 PM

## 2021-10-14 NOTE — Assessment & Plan Note (Signed)
All OPAT lab work reviewed. WBC is mildly decreased at 3.6 - likely due to prolonged cephalosporin. He will be stopping in 3 days time and would expect return back to normal levels.

## 2021-10-21 ENCOUNTER — Telehealth: Payer: Self-pay | Admitting: Family Medicine

## 2021-10-21 NOTE — Telephone Encounter (Signed)
Lele calling from [facility] is calling to report that the pt is discharging today. Pts pickline fell out yesterday. No issue. Still in tact. Has completed IV antibotics. Discharging as of today. Pt will be seen tomorrow. BP 90/70 Pt is not wearing his cardic vest. [phone]

## 2021-10-22 ENCOUNTER — Ambulatory Visit (INDEPENDENT_AMBULATORY_CARE_PROVIDER_SITE_OTHER): Payer: 59 | Admitting: Family Medicine

## 2021-10-22 ENCOUNTER — Encounter: Payer: Self-pay | Admitting: Family Medicine

## 2021-10-22 VITALS — BP 92/60 | HR 75 | Temp 98.0°F | Resp 16 | Ht 66.0 in | Wt 239.8 lb

## 2021-10-22 DIAGNOSIS — J4520 Mild intermittent asthma, uncomplicated: Secondary | ICD-10-CM

## 2021-10-22 DIAGNOSIS — I5020 Unspecified systolic (congestive) heart failure: Secondary | ICD-10-CM | POA: Diagnosis not present

## 2021-10-22 DIAGNOSIS — K089 Disorder of teeth and supporting structures, unspecified: Secondary | ICD-10-CM | POA: Diagnosis not present

## 2021-10-22 DIAGNOSIS — Z09 Encounter for follow-up examination after completed treatment for conditions other than malignant neoplasm: Secondary | ICD-10-CM

## 2021-10-22 DIAGNOSIS — Z6838 Body mass index (BMI) 38.0-38.9, adult: Secondary | ICD-10-CM

## 2021-10-22 NOTE — Progress Notes (Unsigned)
Patient is here for HFU. Patient has no new concerns today..  Patient said he is doing good

## 2021-10-22 NOTE — Progress Notes (Unsigned)
Established Patient Office Visit  Subjective    Patient ID: Kevin Cardenas, male    DOB: January 05, 1976  Age: 46 y.o. MRN: [id number]  CC:  Chief Complaint  Patient presents with   Follow-up    HFU    HPI Kevin Cardenas presents for HFU after admission and discharge with dx of septicemia possibly 2/2 oral abscess. He also had a cardiac implant removed 2/2 infection. He reports that he is doing well today.    Outpatient Encounter Medications as of 10/22/2021  Medication Sig   ascorbic acid (VITAMIN C) 500 MG tablet Take 500 mg by mouth daily.   carvedilol (COREG) 3.125 MG tablet Take 1 tablet (3.125 mg total) by mouth 2 (two) times daily with a meal.   dapagliflozin propanediol (FARXIGA) 10 MG TABS tablet Take 1 tablet (10 mg total) by mouth daily.   digoxin (LANOXIN) 0.125 MG tablet Take 1 tablet (125 mcg total) by mouth daily.   fluticasone furoate-vilanterol (BREO ELLIPTA) 100-25 MCG/ACT AEPB Inhale 1 puff into the lungs daily.   potassium chloride SA (KLOR-CON M) 20 MEQ tablet Take 1 tablet (20 mEq total) by mouth daily.   sacubitril-valsartan (ENTRESTO) 49-51 MG Take 1 tablet by mouth 2 (two) times daily.   simvastatin (ZOCOR) 20 MG tablet Take 1 tablet (20 mg total) by mouth at bedtime.   spironolactone (ALDACTONE) 25 MG tablet Take 1 tablet (25 mg total) by mouth daily.   torsemide (DEMADEX) 20 MG tablet Take 1 tablet (20 mg total) by mouth 2 (two) times daily.   No facility-administered encounter medications on file as of 10/22/2021.    Past Medical History:  Diagnosis Date   AICD (automatic cardioverter/defibrillator) present    Chronic systolic CHF (congestive heart failure) (HCC)    EF 25% by echo 06/2018   Diabetes mellitus    Hypertension    Non-ischemic cardiomyopathy ([place])    Obstructive sleep apnea    VSD (ventricular septal defect) 1978   repaired at age 34    Past Surgical History:  Procedure Laterality Date   EP IMPLANTABLE DEVICE N/A 01/19/2015    Procedure: ICD Implant;  Surgeon: Deboraha Sprang, MD;  Location: [facility];  Service: Cardiovascular;  Laterality: N/A;   ICD LEAD REMOVAL  09/05/2021   Procedure: ICD LASER LEAD REMOVAL;  Surgeon: Vickie Epley, MD;  Location: [facility];  Service: Cardiovascular;;   IMPLANTABLE CARDIOVERTER DEFIBRILLATOR (ICD) GENERATOR CHANGE N/A 09/05/2021   Procedure: DEFIBRILLATOR GENERATOR REMOVAL;  Surgeon: Vickie Epley, MD;  Location: [facility];  Service: Cardiovascular;  Laterality: N/A;   RIGHT HEART CATHETERIZATION N/A 08/03/2014   Procedure: RIGHT HEART CATH;  Surgeon: Larey Dresser, MD;  Location: [facility] CATH LAB;  Service: Cardiovascular;  Laterality: N/A;   RIGHT/LEFT HEART CATH AND CORONARY ANGIOGRAPHY N/A 06/24/2021   Procedure: RIGHT/LEFT HEART CATH AND CORONARY ANGIOGRAPHY;  Surgeon: Larey Dresser, MD;  Location: [place] [facility];  Service: Cardiovascular;  Laterality: N/A;   TEE WITHOUT CARDIOVERSION N/A 09/04/2021   Procedure: TRANSESOPHAGEAL ECHOCARDIOGRAM (TEE);  Surgeon: Larey Dresser, MD;  Location: [facility] ENDOSCOPY;  Service: Cardiovascular;  Laterality: N/A;   TEE WITHOUT CARDIOVERSION N/A 09/05/2021   Procedure: TRANSESOPHAGEAL ECHOCARDIOGRAM (TEE);  Surgeon: Vickie Epley, MD;  Location: [facility] OR;  Service: Cardiovascular;  Laterality: N/A;   VSD REPAIR      Family History  Problem Relation Age of Onset   Lung cancer Father    Stroke Father  Stroke Mother    Diabetes Maternal Grandfather    Diabetes Paternal Grandmother    Cancer Neg Hx    Alcohol abuse Neg Hx    Drug abuse Neg Hx    Early death Neg Hx    Heart disease Neg Hx    Hyperlipidemia Neg Hx    Hypertension Neg Hx    Kidney disease Neg Hx     Social History   Socioeconomic History   Marital status: Single    Spouse name: Recently Married    Number of children: 0   Years of education: Not on file   Highest education level: Not on file  Occupational History   Occupation: Librarian, academic      Comment: 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   Tobacco Use   Smoking status: Former    Packs/day: 0.30    Years: 15.00    Total pack years: 4.50    Types: Cigarettes    Quit date: 04/08/2011    Years since quitting: 10.5   Smokeless tobacco: Never   Tobacco comments:    Smokes depends on who is around him.    Substance and Sexual Activity   Alcohol use: No    Alcohol/week: 0.0 standard drinks of alcohol    Comment: Rarely    Drug use: No   Sexual activity: Not Currently  Other Topics Concern   Not on file  Social History Narrative   Caffienated drinks-no   Seat belt use often-no   Regular Exercise-   Smoke alarm in the home-yes   Firearms/guns in the home-no   History of physical abuse-no               Social Determinants of Health   Financial Resource Strain: Not on file  Food Insecurity: No Food Insecurity (06/24/2021)   Hunger Vital Sign    Worried About Running Out of Food in the Last Year: Never true    Ran Out of Food in the Last Year: Never true  Transportation Needs: No Transportation Needs (06/24/2021)   PRAPARE - Hydrologist (Medical): No    Lack of Transportation (Non-Medical): No  Physical Activity: Not on file  Stress: Not on file  Social Connections: Not on file  Intimate Partner Violence: Not on file    Review of Systems  All other systems reviewed and are negative.       Objective    BP 92/60   Pulse 75   Temp 98 F (36.7 C) (Oral)   Resp 16   Ht _0  (1.676 m)   Wt 239 lb 12.8 oz (108.8 kg)   SpO2 93%   BMI 38.70 kg/m   Physical Exam Vitals and nursing note reviewed.  Constitutional:      General: He is not in acute distress.    Appearance: He is obese.  Cardiovascular:     Rate and Rhythm: Normal rate and regular rhythm.  Pulmonary:     Effort: Pulmonary effort is normal.     Breath sounds: Normal breath sounds.  Abdominal:     Palpations: Abdomen is soft.     Tenderness: There  is no abdominal tenderness.  Neurological:     General: No focal deficit present.     Mental Status: He is alert and oriented to person, place, and time.         Assessment & Plan:   1. HFrEF (heart failure with reduced ejection fraction) ([place])  Appears stable - management per consultant  2. Asthma, stable, mild intermittent Continue present management  3. Poor dentition Patient to schedule with dental professional for further eval/mgt  4. Class 2 severe obesity due to excess calories with serious comorbidity and body mass index (BMI) of 38.0 to 38.9 in adult ([place])   5. Hospital discharge follow-up Improved and stable since discharge    No follow-ups on file.   Becky Sax, MD

## 2021-10-23 ENCOUNTER — Encounter: Payer: Self-pay | Admitting: Family Medicine

## 2021-10-30 ENCOUNTER — Telehealth (HOSPITAL_COMMUNITY): Payer: Self-pay

## 2021-10-30 NOTE — Telephone Encounter (Signed)
Called and spoke to Kevin Cardenas in regards to setting up a home visit for tomorrow and he reports he has a lot going on and is unable to visit. He was reminded of his upcoming appointment on Monday with [name] at 2:00 at the HF clinic, he was aware and plans to be there. I advised him to be sure to bring all meds to that appointment and I would meet him there.   Call complete.   [name], [place] 10/30/2021

## 2021-10-31 ENCOUNTER — Telehealth (HOSPITAL_COMMUNITY): Payer: Self-pay

## 2021-10-31 NOTE — Telephone Encounter (Signed)
Called to confirm/remind patient of their appointment at the [facility] on 11/04/21.   Patient reminded to bring all medications and/or complete list.  Confirmed patient has transportation. Gave directions, instructed to utilize [place] parking.  Confirmed appointment prior to ending call.

## 2021-11-04 ENCOUNTER — Encounter (HOSPITAL_COMMUNITY): Payer: Self-pay | Admitting: Cardiology

## 2021-11-04 ENCOUNTER — Other Ambulatory Visit (HOSPITAL_COMMUNITY): Payer: Self-pay

## 2021-11-04 ENCOUNTER — Ambulatory Visit (HOSPITAL_COMMUNITY)
Admission: RE | Admit: 2021-11-04 | Discharge: 2021-11-04 | Disposition: A | Discharge status: Home / Self Care | Payer: 59 | Source: Ambulatory Visit | Attending: Cardiology | Admitting: Cardiology

## 2021-11-04 VITALS — BP 100/72 | HR 84 | Wt 243.0 lb

## 2021-11-04 DIAGNOSIS — I5022 Chronic systolic (congestive) heart failure: Secondary | ICD-10-CM | POA: Insufficient documentation

## 2021-11-04 MED ORDER — CARVEDILOL 6.25 MG PO TABS
6.2500 mg | ORAL_TABLET | Freq: Two times a day (BID) | ORAL | 3 refills | Status: DC
  Filled 2021-11-04: qty 180, 90d supply, fill #0
  Filled 2022-03-04: qty 180, 90d supply, fill #1
  Filled 2022-06-16: qty 180, 90d supply, fill #2
  Filled 2022-09-18 (×3): qty 180, 90d supply, fill #3

## 2021-11-04 MED ORDER — POTASSIUM CHLORIDE CRYS ER 20 MEQ PO TBCR
40.0000 meq | EXTENDED_RELEASE_TABLET | Freq: Every day | ORAL | 3 refills | Status: DC
  Filled 2021-11-04 (×2): qty 180, 90d supply, fill #0
  Filled 2022-01-14: qty 180, 90d supply, fill #1
  Filled 2022-05-16: qty 180, 90d supply, fill #2
  Filled 2022-08-18: qty 180, 90d supply, fill #3

## 2021-11-04 MED ORDER — TORSEMIDE 20 MG PO TABS
ORAL_TABLET | ORAL | 3 refills | Status: DC
  Filled 2021-11-04 (×2): qty 200, 67d supply, fill #0

## 2021-11-04 NOTE — Progress Notes (Signed)
Paramedicine Encounter    Patient ID: Kevin Cardenas, male    DOB: [birth year], 46 y.o.   MRN: [id number]  Met with Kevin Cardenas in clinic today where he was seen by [name] who made several med changes. I assisted Kevin Cardenas in discussing med changes following clinic visit and I also set up a pill box for him for one week with these changes.   -Torsemide 26m morning, 24mevening. -Carvedilol 6.2557morning and evening -Potassium 40MEQ morning   Dr. McLAundra Dubinggests keeping Kevin Cardenas enrolled in paramedicine as he has several med changes today and feels Kevin Cardenas not been taking his meds correctly. I will continue to follow up with Kevin Cardenas plan to see him in one week.   Kevin SanerMTBig [place]24/2023     ACTION: Home visit completed

## 2021-11-04 NOTE — Patient Instructions (Signed)
Medication Changes:  Increase Carvedilol to 6.27m Twice daily  Increase Potassium to 2 Tabs daily.  Change Torsemide to 431m(2 Tabs ) in the morning and 2055m1 Tab) in the evening   Lab Work:  Labs done today, your results will be available in MyChart, we will contact you for abnormal readings.   Testing/Procedures:  Repeat blood work in 10 days  Referrals:  none  Special Instructions // Education:  none  Follow-Up in: 1 month   [facility] and your health needs are our priority. We have a designated team specialized in the treatment of Heart Failure. This Care Team includes your primary Heart Failure Specialized Cardiologist (physician), Advanced Practice Providers (APPs- Physician Assistants and Nurse Practitioners), and Pharmacist who all work together to provide you with the care you need, when you need it.   You may see any of the following providers on your designated Care Team at your next follow up:  Dr DanGlori Bickers DalHaynes KernsP BriLyda JesterA [facility]A UtahuAudry RilesharmD   Please be sure to bring in all your medications bottles to every appointment.   Need to Contact Us:KoreaIf you have any questions or concerns before your next appointment please send us Koreamessage through [place] call our office at [phone]  TO LEAVE A MESSAGE FOR THE NURSE SELECT OPTION 2, PLEASE LEAVE A MESSAGE INCLUDING: YOUR NAME DATE OF BIRTH CALL BACK NUMBER REASON FOR CALL**this is important as we prioritize the call backs  YOU WILL RECEIVE A CALL BACK THE SAME DAY AS LONG AS YOU CALL BEFORE 4:00 PM

## 2021-11-05 NOTE — Progress Notes (Signed)
ID:  RONTAE INGLETT, DOB [birth year], MRN [id number]   Provider location: [facility] Type of Visit: Established patient  PCP:  [name], MD  HF Cardiologist:  [name]   History of Present Illness: Kevin Cardenas is a 46 y.o. male who has a history of VSD repair and removal of a pulmonary artery band at age 15, DM, HTN, systolic heart failure, NICM and OSA diagnosed in past but not on CPAP. Has St Jude ICD (placed 01/2015).   Echo in 3/20 showed EF 25%, mild LV dilation, mildly decreased RV systolic function.   CPX in 10/20 showed moderate functional limitation primarily due to restrictive lung disease.  I had planned to get PFTs and a high resolution CT chest to followup on lung restriction but was never scheduled.   At last appointment, he had been off his meds for several months but supposedly had restarted them.  Weight was up 20 lbs and he was volume overloaded.  I stopped Lasix and started torsemide, kept other meds as ordered.  He came to the ER later in 7/21 with a bleeding varicose vein but was noted to be hypotensive with AKI.  He was thought to be over-diuresed.  Torsemide was cut back and Entresto was decreased.   Echo in 8/21 showed EF 30-35%, no significant residual VSD, normal RV.    Admitted 3/23 with a/c respiratory failure with hypoxia secondary to a/c CHF and noncompliance with CPAP for OSA/OHS. PICC line placed. Co-ox okay. He was enrolled in FASTR trial for diuresis. Diuresed 24 lb. FASTR trial discontinued on 03/11. Transitioned to torsemide 20 mg bid (prior home dose). GDMT titrated.  R/LHC on 03/13 with normal coronaries, normal filling pressures and normal CO. Mild pulmonary HTN. Referred to paramedicine. HH PT/OT recommended. Patient declined [place].  Follow up 4/23, stable NYHA II, not volume overloaded.   Re-admitted 5/23 with sepsis 2/2 right lower extremity cellulitis. Started on abx and IVF. Required NE and transferred  to ICU. Echo this admit showed EF 35% w/ global HK, no residual VSD, RV normal w/ mildly elevated RVSP, 36 mmHG, trivial MR/TR, AoV not well visualized, no vegetations noted. AHF consulted, underwent TEE showing 0.5 x 0.9 cm vegetation on the ICD lead, no valvular vegetation noted. Subsequently had ICD extraction, with plans for IV cefazolin via PICC x 6 weeks. LifeVest placed and discharged home, weight 231 lbs.  Today he returns for HF follow up.  Only wearing the Lifevest about 3.5 hrs/day on average.  He has completed cefazolin.  No fever/chills.  Generally feels good, reports no significant exertional dyspnea.  No orthopnea/PND.  No lightheadedness.  Weight is up about 8 lbs.   ECG (personally reviewed): NSR, IVCD 134 msec  Labs (1/16): K 4.1, creatinine 0.9, TSH normal, LDL 89, HDL 46 Labs (2/16): K 4.1 Creatinine 0.97  Labs (3/16): BNP 54, K 4.4, creatinine 1.0 Labs 5/16): K 4.0 Creatinine 0.93  Labs (6/16): digoxin < 0.2 Labs (9/16): K 3.7, creatinine 0.93, digoxin < 0.5, HCT 40.4 Labs (10/17): dig 0.5 K 4.1 Creatinine 0.94  Labs (6/19): K 4.3 Creatinine 0.95 Labs (3/20): K 4.3, creatinine 1.02, digoxin < 0.2, LDL 79 Labs (9/20): K 3.8, creatinine 0.82, digoxin < 0.2 Labs (7/21): K 3.8, creatinine 1.0 => 2.34 => 1.08, BNP 81, TSH normal Labs (9/21): digoxin 0.5, K 4.3, creatinine 1.12 Labs (5/22): digoxin 0.2, K 4, creatinine 1.06 Labs (4/23): K 3.8, creatinine 1.10  Labs (5/23): K 3.9, creatinine 1.12 Labs (7/23): K 4.2, creatinine 1.55, hgb 12    PMH: 1. VSD repair at 1.  2. Type 2 diabetes. 3. HTN 4. OSA: Not using CPAP.  5. Chronic systolic CHF: Nonischemic cardiomyopathy.  St Jude ICD.  - Echo 2009: EF 35-40% - LHC 2009: No angiographic coronary disease.  - Echo 9/11: LVEF 20-25%  - Echo 11/12: LVEF 30-35%, diffuse hypokinesis and mild LVH. RV systolic mildly reduced. LA mildly dilated. Pulmonic valve with mild stenosis.  - Echo 05/12/2014: EF 15-20% Grade IDD, LV  moderately dilated.  RV normal. Mild pulmonc stenosis mean peak gradient 14.  - Echo 7/16: EF 25-30%, mild LV dilation, mild LVH, trivial pulmonic stenosis with peak gradient 11 mmHg, mildly decreased RV function with mild RV dilation.   - CPX (3/16): RER 1.03, peak VO2 19.9, VE/VCO2 slope 18, mild to moderately impaired functional capacity.  There was moderate to severe mixed restrictive/obstructive pattern on spirometry => exertional hypoxemia, oxygen saturation decreased to 84%.  - RHC (4/16): mean RA 9, PA 35/12, mean PCWP 8, CI 1.8 Fick, CI 2.4 thermo.  - Echo 02/2106: EF 30-35% Grade IIDD  - Echo 3/20: EF 25%, mild LV dilation, diffuse hypokinesis, mildly decreased RV systolic function with mild RV dilation, no definite PS noted.  - CPX (10/20): VE/VCO2 16, peak VO2 15.8 (57% predicted), RER 1.08.  Moderate functional limitation primarily due to restrictive lung disease.  Severe restriction on PFTs.  - Echo (8/21): EF 30-35%, no significant residual VSD, normal RV.  - LHC/RHC (3/23): normal coronaries; mean RA 6, PA 47/27, mean PCWP 14, CI 3.28 - Echo (5/23): EF 35% w/ global HK, no residual VSD, RV normal w/ mildly elevated RVSP, 36 mmHG. Trival MR/TR. AoV not well visualized. No vegetations noted. - TEE (5/23): EF 35-40%, global hypokinesis, ICD vegetation, mild PS peak gradient 13 mmHg.  6. Pulmonic stenosis: Mild, peak gradient 13 mmHg on TEE in 5/23.  7. High resolution CT chest in 8/21: No evidence for ILD.  8. MSSA  bacteremia: TEE 09/04/21 showed 0.5 x 0.9 cm vegetation on the ICD lead as it traverses the RA. No valvular vegetation noted. - s/p ICD extraction 5/23.  Current Outpatient Medications  Medication Sig Dispense Refill   ascorbic acid (VITAMIN C) 500 MG tablet Take 500 mg by mouth daily.     dapagliflozin propanediol (FARXIGA) 10 MG TABS tablet Take 1 tablet (10 mg total) by mouth daily. 90 tablet 3   digoxin (LANOXIN) 0.125 MG tablet Take 1 tablet (125 mcg total) by mouth  daily. 90 tablet 2   fluticasone furoate-vilanterol (BREO ELLIPTA) 100-25 MCG/ACT AEPB Inhale 1 puff into the lungs daily. 60 each 11   sacubitril-valsartan (ENTRESTO) 49-51 MG Take 1 tablet by mouth 2 (two) times daily. 180 tablet 3   simvastatin (ZOCOR) 20 MG tablet Take 1 tablet (20 mg total) by mouth at bedtime. 90 tablet 3   spironolactone (ALDACTONE) 25 MG tablet Take 1 tablet (25 mg total) by mouth daily. 30 tablet 1   carvedilol (COREG) 6.25 MG tablet Take 1 tablet (6.25 mg total) by mouth 2 (two) times daily with a meal. 180 tablet 3   potassium chloride SA (KLOR-CON M) 20 MEQ tablet Take 2 tablets (40 mEq total) by mouth daily. 180 tablet 3   torsemide (DEMADEX) 20 MG tablet Take 2 tablets (40 mg total) by mouth every morning AND 1 tablet (20 mg total) every evening. 200 tablet 3  No current facility-administered medications for this encounter.    Allergies:   Metolazone   Social History:  The patient  reports that he quit smoking about 10 years ago. His smoking use included cigarettes. He has a 4.50 pack-year smoking history. He has never used smokeless tobacco. He reports that he does not drink alcohol and does not use drugs.   Family History:  The patient's family history includes Diabetes in his maternal grandfather and paternal grandmother; Lung cancer in his father; Stroke in his father and mother.   ROS:  Please see the history of present illness.   All other systems are personally reviewed and negative.   Exam:   BP 100/72   Pulse 84   Wt 110.2 kg (243 lb)   SpO2 93%   BMI 39.22 kg/m  General: NAD Neck: JVP 8-9 cm with HJR, no thyromegaly or thyroid nodule.  Lungs: Clear to auscultation bilaterally with normal respiratory effort. CV: Nondisplaced PMI.  Heart regular S1/S2, no S3/S4, 1/6 SEM USB.  Trace ankle edema.  No carotid bruit.  Normal pedal pulses.  Abdomen: Soft, nontender, no hepatosplenomegaly, no distention.  Skin: Intact without lesions or rashes.   Neurologic: Alert and oriented x 3.  Psych: Normal affect. Extremities: No clubbing or cyanosis.  HEENT: Normal.   Recent Labs: 06/19/2021: TSH 0.984 09/09/2021: Magnesium 2.4 09/17/2021: B Natriuretic Peptide 8.6 10/09/2021: ALT 6; BUN 15; Creatinine, Ser 1.17; Hemoglobin 12.4; Platelets 158; Potassium 3.9; Sodium 137  Personally reviewed   Wt Readings from Last 3 Encounters:  11/04/21 110.2 kg (243 lb)  10/22/21 108.8 kg (239 lb 12.8 oz)  10/14/21 110.2 kg (243 lb)   ASSESSMENT AND PLAN: 1. Chronic systolic CHF: Nonischemic cardiomyopathy.  RHC in 4/16 showed preserved cardiac index by thermodilution but low by Fick.   Last echo in 8/21 showed EF 30-35%, normal RV, no residual VSD, no definite pulmonic stenosis.  Echo in 3/23 showed EF 30-35%, mild LVH, no VSD, no significant pulmonary stenosis, moderate RVE with normal RV systolic function. LHC/RHC in 3/23 with no coronary disease, CI 3.28.  He had a [employer], [employer] ICD. Echo during recent admission (5/23), looked similar to 3/23.  TEE (5/23) showed LV EF 35-40%, mild RV dilation with moderate RV dysfunction, mild PS (mean gradient 13 mmHg), no VSD, there is a small 0.5 x 0.9 cm vegetation on the ICD lead as it traverses the RA, no valvular vegetation noted.  Now, s/p ICD extraction with Lifevest that he is wearing sporadically. Stable NYHA II, he is volume overloaded on exam (mildly) with weight up.  - Increase torsemide to 40 qam/20 qpm, increase KCl to 40 daily. BMET today and in 10 days.  - Increase Coreg to 6.25 mg bid.  - Continue Entresto 49/51 bid. - Continue spironolactone 25 mg daily.  - Continue digoxin 0.125 daily.  Check digoxin level.  - Continue Farxiga 10 mg daily.   - He follows with EP, plan for cardiac MRI to quantify EF.  If EF < 35%, will plan subcutaneous ICD.  He will continue Lifevest for now, I encouraged him to wear it more regularly.  2. H/o MSSA bacteremia: ? Source from RLE cellulitis. Had St Jude ICD => TEE 09/04/21  showed 0.5 x 0.9 cm vegetation on the ICD lead as it traverses the RA, no valvular vegetation noted. ICD extracted 09/05/21.  He has completed cefazolin.  - He will get cMRI soon, if EF < 35% will arrange for subcutaneous ICD.  3.  H/o VSD: No residual shunt noted 3/23 echo.  Last RHC also did not suggest any significant left to right shunting. TEE 5/23 showed no definite VSD.  4. Pulmonary stenosis: Mild on TEE in 5/23, peak gradient 13 mmHg.  5. OSA: CPAP at night.    Followup in 1 month with APP.   [name] 11/05/2021

## 2021-11-13 ENCOUNTER — Telehealth (HOSPITAL_COMMUNITY): Payer: Self-pay

## 2021-11-13 NOTE — Telephone Encounter (Signed)
Reached out to [place] to up home visit for Monday 8/7 as I was unable to get out to see him this week. He reports he has been doing well and he filled his own pill box with the new med changes from last week based off his AVS from clinic. I reminded him of labs tomorrow at 230 and he agreed.   We planned for home visit on  Monday and he agreed and had no conflicts. Call complete.   [name], [place] 11/13/2021

## 2021-11-14 ENCOUNTER — Ambulatory Visit (HOSPITAL_COMMUNITY)
Admission: RE | Admit: 2021-11-14 | Discharge: 2021-11-14 | Disposition: A | Discharge status: Home / Self Care | Payer: Medicare Other | Source: Ambulatory Visit | Attending: Cardiology | Admitting: Cardiology

## 2021-11-14 DIAGNOSIS — I5022 Chronic systolic (congestive) heart failure: Secondary | ICD-10-CM | POA: Diagnosis not present

## 2021-11-14 LAB — BASIC METABOLIC PANEL
Anion gap: 10 (ref 5–15)
BUN: 17 mg/dL (ref 6–20)
CO2: 29 mmol/L (ref 22–32)
Calcium: 9.2 mg/dL (ref 8.9–10.3)
Chloride: 99 mmol/L (ref 98–111)
Creatinine, Ser: 1.16 mg/dL (ref 0.61–1.24)
GFR, Estimated: >60 mL/min (ref >60)
Glucose, Bld: 119 mg/dL — ABNORMAL HIGH (ref 70–99)
Potassium: 4.2 mmol/L (ref 3.5–5.1)
Sodium: 138 mmol/L (ref 135–145)

## 2021-11-19 ENCOUNTER — Other Ambulatory Visit (HOSPITAL_COMMUNITY): Payer: Self-pay

## 2021-11-19 NOTE — Progress Notes (Signed)
Paramedicine Encounter    Patient ID: Kevin Cardenas, male    DOB: 1975-05-06, 46 y.o.   MRN: [id number]   Arrived for home visit for [facility] as he was seated in his recliner alert and oriented. He reports feeling good and denied chest pain, shortness of breath, dizziness or swelling. He reports he has been compliant with his medications over the last two weeks and successfully filled his own pill box last week and this week. I checked for accuracy and it was 100% correct- I refilled Monday, Tuesday's slots and we reviewed each medication together and he was able to tell me where it goes and how many of each successfully.   We reviewed upcoming appointments and confirmed HF clinic visit in two weeks. I plan to meet him there. He agreed for that to be our next visit.   Vitals obtained as noted: WT- 235lbs BP- 122/70 HR- 92 O2- 95% RR-16   No lower leg swelling noted, lungs clear.   Kevin Cardenas is not wearing LifeVest on our visit today- I encouraged him to be sure he is wearing it regularly. He verbalized agreement.   Home visit complete. I plan to see Kevin Cardenas in two weeks in clinic.   [name], [place] 11/19/2021     Patient Care Team: Dorna Mai, MD as PCP - General (Family Medicine) Larey Dresser, MD as PCP - Advanced Heart Failure (Cardiology) Larey Dresser, MD as PCP - Cardiology (Cardiology) Vickie Epley, MD as PCP - Electrophysiology (Cardiology)  Patient Active Problem List   Diagnosis Date Noted   PICC (peripherally inserted central catheter) in place 09/20/2021   Hypotension 09/20/2021   Aicd lead infection ([place]) 09/19/2021   Medication monitoring encounter 09/19/2021   Acute on chronic systolic and diastolic heart failure, NYHA class 3 ([place]) 06/19/2021   HFrEF (heart failure with reduced ejection fraction) ([place]) 11/06/2019   AKI (acute kidney injury) ([place]) 11/05/2019   Orthostatic hypotension 11/04/2019   Class 2 obesity due  to excess calories with serious comorbidity in adult 01/16/2016   Asthma, stable, mild intermittent 01/15/2016   OSA on CPAP 06/21/2014   Routine general medical examination at a health care facility 04/18/2014   Hyperlipidemia with target LDL less than 100 04/17/2014   Non-ischemic cardiomyopathy ([place]) 09/47/0962   Systolic CHF, chronic ([place]) 02/06/2011   VSD (ventricular septal defect) 02/06/2011   HTN (hypertension) 02/06/2011   Prediabetes 01/30/2011    Current Outpatient Medications:    ascorbic acid (VITAMIN C) 500 MG tablet, Take 500 mg by mouth daily., Disp: , Rfl:    carvedilol (COREG) 6.25 MG tablet, Take 1 tablet (6.25 mg total) by mouth 2 (two) times daily with a meal., Disp: 180 tablet, Rfl: 3   dapagliflozin propanediol (FARXIGA) 10 MG TABS tablet, Take 1 tablet (10 mg total) by mouth daily., Disp: 90 tablet, Rfl: 3   digoxin (LANOXIN) 0.125 MG tablet, Take 1 tablet (125 mcg total) by mouth daily., Disp: 90 tablet, Rfl: 2   fluticasone furoate-vilanterol (BREO ELLIPTA) 100-25 MCG/ACT AEPB, Inhale 1 puff into the lungs daily., Disp: 60 each, Rfl: 11   potassium chloride SA (KLOR-CON M) 20 MEQ tablet, Take 2 tablets (40 mEq total) by mouth daily., Disp: 180 tablet, Rfl: 3   sacubitril-valsartan (ENTRESTO) 49-51 MG, Take 1 tablet by mouth 2 (two) times daily., Disp: 180 tablet, Rfl: 3   simvastatin (ZOCOR) 20 MG tablet, Take 1 tablet (20 mg total) by mouth at bedtime., Disp: 90 tablet, Rfl:  3   spironolactone (ALDACTONE) 25 MG tablet, Take 1 tablet (25 mg total) by mouth daily., Disp: 30 tablet, Rfl: 1   torsemide (DEMADEX) 20 MG tablet, Take 2 tablets (40 mg total) by mouth every morning AND 1 tablet (20 mg total) every evening., Disp: 200 tablet, Rfl: 3 Allergies  Allergen Reactions   Metolazone Other (See Comments)    "Dried up my kidneys- took only one tablet"     Social History   Socioeconomic History   Marital status: Single    Spouse name: Recently Married     Number of children: 0   Years of education: Not on file   Highest education level: Not on file  Occupational History   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   Tobacco Use   Smoking status: Former    Packs/day: 0.30    Years: 15.00    Total pack years: 4.50    Types: Cigarettes    Quit date: 04/08/2011    Years since quitting: 10.6   Smokeless tobacco: Never   Tobacco comments:    Smokes depends on who is around him.    Substance and Sexual Activity   Alcohol use: No    Alcohol/week: 0.0 standard drinks of alcohol    Comment: Rarely    Drug use: No   Sexual activity: Not Currently  Other Topics Concern   Not on file  Social History Narrative   Caffienated drinks-no   Seat belt use often-no   Regular Exercise-   Smoke alarm in the home-yes   Firearms/guns in the home-no   History of physical abuse-no               Social Determinants of Health   Financial Resource Strain: Not on file  Food Insecurity: No Food Insecurity (06/24/2021)   Hunger Vital Sign    Worried About Running Out of Food in the Last Year: Never true    Ran Out of Food in the Last Year: Never true  Transportation Needs: No Transportation Needs (06/24/2021)   PRAPARE - Hydrologist (Medical): No    Lack of Transportation (Non-Medical): No  Physical Activity: Not on file  Stress: Not on file  Social Connections: Not on file  Intimate Partner Violence: Not on file    Physical Exam      Future Appointments  Date Time Provider [place]  12/05/2021  2:00 PM [facility] PA/NP [facility] None  12/30/2021  8:00 AM MC-MR 1 [facility]  03/10/2022  1:30 PM [place], PA-C [facility]  04/21/2022  3:20 PM Dorna Mai, MD P[facility]-P[facility] None     ACTION: Home visit completed

## 2021-12-04 ENCOUNTER — Telehealth (HOSPITAL_COMMUNITY): Payer: Self-pay

## 2021-12-04 NOTE — Telephone Encounter (Signed)
Called to confirm/remind patient of their appointment at the [facility] on 12/05/21.   Patient reminded to bring all medications and/or complete list.  Confirmed patient has transportation. Gave directions, instructed to utilize [address] parking.  Confirmed appointment prior to ending call.

## 2021-12-05 ENCOUNTER — Other Ambulatory Visit (HOSPITAL_COMMUNITY): Payer: Self-pay

## 2021-12-05 ENCOUNTER — Ambulatory Visit (HOSPITAL_COMMUNITY)
Admission: RE | Admit: 2021-12-05 | Discharge: 2021-12-05 | Disposition: A | Discharge status: Home / Self Care | Payer: Medicare Other | Source: Ambulatory Visit | Attending: Adult Health | Admitting: Adult Health

## 2021-12-05 ENCOUNTER — Encounter (HOSPITAL_COMMUNITY): Payer: Self-pay

## 2021-12-05 VITALS — BP 90/62 | HR 81 | Wt 240.0 lb

## 2021-12-05 DIAGNOSIS — I5022 Chronic systolic (congestive) heart failure: Secondary | ICD-10-CM | POA: Diagnosis not present

## 2021-12-05 DIAGNOSIS — I38 Endocarditis, valve unspecified: Secondary | ICD-10-CM | POA: Insufficient documentation

## 2021-12-05 DIAGNOSIS — Q256 Stenosis of pulmonary artery: Secondary | ICD-10-CM | POA: Insufficient documentation

## 2021-12-05 DIAGNOSIS — I5020 Unspecified systolic (congestive) heart failure: Secondary | ICD-10-CM | POA: Diagnosis not present

## 2021-12-05 DIAGNOSIS — G4733 Obstructive sleep apnea (adult) (pediatric): Secondary | ICD-10-CM

## 2021-12-05 DIAGNOSIS — Z9989 Dependence on other enabling machines and devices: Secondary | ICD-10-CM

## 2021-12-05 DIAGNOSIS — Z8774 Personal history of (corrected) congenital malformations of heart and circulatory system: Secondary | ICD-10-CM | POA: Insufficient documentation

## 2021-12-05 DIAGNOSIS — I110 Hypertensive heart disease with heart failure: Secondary | ICD-10-CM | POA: Diagnosis not present

## 2021-12-05 DIAGNOSIS — R0602 Shortness of breath: Secondary | ICD-10-CM | POA: Diagnosis not present

## 2021-12-05 DIAGNOSIS — E119 Type 2 diabetes mellitus without complications: Secondary | ICD-10-CM | POA: Insufficient documentation

## 2021-12-05 DIAGNOSIS — R7881 Bacteremia: Secondary | ICD-10-CM

## 2021-12-05 DIAGNOSIS — B9561 Methicillin susceptible Staphylococcus aureus infection as the cause of diseases classified elsewhere: Secondary | ICD-10-CM | POA: Diagnosis not present

## 2021-12-05 DIAGNOSIS — I428 Other cardiomyopathies: Secondary | ICD-10-CM | POA: Diagnosis not present

## 2021-12-05 MED ORDER — TORSEMIDE 20 MG PO TABS
40.0000 mg | ORAL_TABLET | Freq: Every day | ORAL | 3 refills | Status: DC
  Filled 2021-12-05 – 2022-01-14 (×2): qty 60, 30d supply, fill #0
  Filled 2022-03-04: qty 60, 30d supply, fill #1
  Filled 2022-04-11: qty 60, 30d supply, fill #2
  Filled 2022-05-16: qty 60, 30d supply, fill #3

## 2021-12-05 NOTE — Progress Notes (Signed)
Paramedicine Encounter    Patient ID: Kevin Cardenas, male    DOB: [birth year], 46 y.o.   MRN: [id number]   Arrived for clinic visit with [facility] where he was seen by Darrick Grinder today. He denied any complaints reporting he has been feeling good.   Granvel noted to be wearing LifeVest today however report stated he is only wearing it 2 days out of 30 days. He has been non compliant with this and Amy and I stressed the importance of wearing it due to him not having his ICD implanted. He verbalized understanding and states he is going to do better.   [name] has been more compliant with his medications and has been taking them properly. He has been filling pill box- he filled same today and I checked it for accuracy- it was correct. Mohab is knowledgeable about his medications.  I assisted him in calling in his [facility] [place] for refills- lately he has called in his own refills and picked them up on his own as well.   Amy made the following med changers today-  DECREASE TORSEMIDE TO 40MG DAILY   PILL BOX WAS CORRECTED WITH EVENING DOSE REMOVED.   We encouraged him to be sure to wear his lifevest, improve exercise and to continue to be compliant with his meds. He verbalized understanding and agreed to do so.   I will plan to call Aswad next week for phone check in and see him in the home in two weeks. He agreed. Clinic visit complete.   Kevin Cardenas, [place] [phone] 12/05/2021     Patient Care Team: Dorna Mai, MD as PCP - General (Family Medicine) Larey Dresser, MD as PCP - Advanced Heart Failure (Cardiology) Larey Dresser, MD as PCP - Cardiology (Cardiology) Vickie Epley, MD as PCP - Electrophysiology (Cardiology)  Patient Active Problem List   Diagnosis Date Noted   PICC (peripherally inserted central catheter) in place 09/20/2021   Hypotension 09/20/2021   Aicd lead infection ([place]) 09/19/2021   Medication monitoring encounter 09/19/2021   Acute on  chronic systolic and diastolic heart failure, NYHA class 3 ([place]) 06/19/2021   HFrEF (heart failure with reduced ejection fraction) ([place]) 11/06/2019   AKI (acute kidney injury) ([place]) 11/05/2019   Orthostatic hypotension 11/04/2019   Class 2 obesity due to excess calories with serious comorbidity in adult 01/16/2016   Asthma, stable, mild intermittent 01/15/2016   OSA on CPAP 06/21/2014   Routine general medical examination at a health care facility 04/18/2014   Hyperlipidemia with target LDL less than 100 04/17/2014   Non-ischemic cardiomyopathy ([place]) 56/38/7564   Systolic CHF, chronic ([place]) 02/06/2011   VSD (ventricular septal defect) 02/06/2011   HTN (hypertension) 02/06/2011   Prediabetes 01/30/2011    Current Outpatient Medications:    ascorbic acid (VITAMIN C) 500 MG tablet, Take 500 mg by mouth daily., Disp: , Rfl:    carvedilol (COREG) 6.25 MG tablet, Take 1 tablet (6.25 mg total) by mouth 2 (two) times daily with a meal., Disp: 180 tablet, Rfl: 3   dapagliflozin propanediol (FARXIGA) 10 MG TABS tablet, Take 1 tablet (10 mg total) by mouth daily., Disp: 90 tablet, Rfl: 3   digoxin (LANOXIN) 0.125 MG tablet, Take 1 tablet (125 mcg total) by mouth daily., Disp: 90 tablet, Rfl: 2   fluticasone furoate-vilanterol (BREO ELLIPTA) 100-25 MCG/ACT AEPB, Inhale 1 puff into the lungs daily., Disp: 60 each, Rfl: 11   potassium chloride SA (KLOR-CON M) 20 MEQ tablet, Take 2 tablets (40  mEq total) by mouth daily., Disp: 180 tablet, Rfl: 3   sacubitril-valsartan (ENTRESTO) 49-51 MG, Take 1 tablet by mouth 2 (two) times daily., Disp: 180 tablet, Rfl: 3   simvastatin (ZOCOR) 20 MG tablet, Take 1 tablet (20 mg total) by mouth at bedtime., Disp: 90 tablet, Rfl: 3   spironolactone (ALDACTONE) 25 MG tablet, Take 1 tablet (25 mg total) by mouth daily., Disp: 30 tablet, Rfl: 1   torsemide (DEMADEX) 20 MG tablet, Take 2 tablets (40 mg total) by mouth daily., Disp: 60 tablet, Rfl: 3 Allergies  Allergen  Reactions   Metolazone Other (See Comments)    "Dried up my kidneys- took only one tablet"     Social History   Socioeconomic History   Marital status: Single    Spouse name: Recently Married    Number of children: 0   Years of education: Not on file   Highest education level: Not on file  Occupational History   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   Tobacco Use   Smoking status: Former    Packs/day: 0.30    Years: 15.00    Total pack years: 4.50    Types: Cigarettes    Quit date: 04/08/2011    Years since quitting: 10.6   Smokeless tobacco: Never   Tobacco comments:    Smokes depends on who is around him.    Substance and Sexual Activity   Alcohol use: No    Alcohol/week: 0.0 standard drinks of alcohol    Comment: Rarely    Drug use: No   Sexual activity: Not Currently  Other Topics Concern   Not on file  Social History Narrative   Caffienated drinks-no   Seat belt use often-no   Regular Exercise-   Smoke alarm in the home-yes   Firearms/guns in the home-no   History of physical abuse-no               Social Determinants of Health   Financial Resource Strain: Not on file  Food Insecurity: No Food Insecurity (06/24/2021)   Hunger Vital Sign    Worried About Running Out of Food in the Last Year: Never true    Ran Out of Food in the Last Year: Never true  Transportation Needs: No Transportation Needs (06/24/2021)   PRAPARE - Hydrologist (Medical): No    Lack of Transportation (Non-Medical): No  Physical Activity: Not on file  Stress: Not on file  Social Connections: Not on file  Intimate Partner Violence: Not on file    Physical Exam      Future Appointments  Date Time Provider [place]  12/30/2021  8:00 AM MC-MR 1 [facility]  01/14/2022  2:30 PM [facility] PA/NP [facility] None  03/10/2022  1:30 PM Baldwin [place], PA-C [facility]  04/21/2022  3:20  PM Dorna Mai, MD PCE-PCE None     ACTION: Home visit completed

## 2021-12-05 NOTE — Patient Instructions (Signed)
DECREASE Torsemide to 40 mg daily   Your physician recommends that you schedule a follow-up appointment in: 6 weeks  in the Advanced Practitioners (PA/NP) Clinic     Do the following things EVERYDAY: Weigh yourself in the morning before breakfast. Write it down and keep it in a log. Take your medicines as prescribed Eat low salt foods--Limit salt (sodium) to 2000 mg per day.  Stay as active as you can everyday Limit all fluids for the day to less than 2 liters   At the [facility], you and your health needs are our priority. As part of our continuing mission to provide you with exceptional heart care, we have created designated Provider Care Teams. These Care Teams include your primary Cardiologist (physician) and Advanced Practice Providers (APPs- Physician Assistants and Nurse Practitioners) who all work together to provide you with the care you need, when you need it.   You may see any of the following providers on your designated Care Team at your next follow up: Dr Glori Bickers Dr Haynes Kerns, NP Lyda Jester, [place] [facility] [place], [place] Audry Riles, PharmD   Please be sure to bring in all your medications bottles to every appointment.    If you have any questions or concerns before your next appointment please send [place] a message through [place] or call our office at [phone].    TO LEAVE A MESSAGE FOR THE NURSE SELECT OPTION 2, PLEASE LEAVE A MESSAGE INCLUDING: YOUR NAME DATE OF BIRTH CALL BACK NUMBER REASON FOR CALL**this is important as we prioritize the call backs  YOU WILL RECEIVE A CALL BACK THE SAME DAY AS LONG AS YOU CALL BEFORE 4:00 PM

## 2021-12-05 NOTE — Progress Notes (Signed)
ID:  Kevin Cardenas, DOB July 07, 1975, MRN [id number]   Provider location: [place] Type of Visit: Established patient  PCP:  [name], MD  HF Cardiologist:  [name] EP: Dr Curt Bears.    History of Present Illness: Kevin Cardenas is a 46 y.o. male who has a history of VSD repair and removal of a pulmonary artery band at age 28, DM, HTN, systolic heart failure, NICM and OSA diagnosed in past but not on CPAP. Had St Jude ICD (placed 01/2015). Removed ICD due to endocarditis.   Echo in 3/20 showed EF 25%, mild LV dilation, mildly decreased RV systolic function.   CPX in 10/20 showed moderate functional limitation primarily due to restrictive lung disease.  I had planned to get PFTs and a high resolution CT chest to followup on lung restriction but was never scheduled.   At last appointment, he had been off his meds for several months but supposedly had restarted them.  Weight was up 20 lbs and he was volume overloaded.  I stopped Lasix and started torsemide, kept other meds as ordered.  He came to the ER later in 7/21 with a bleeding varicose vein but was noted to be hypotensive with AKI.  He was thought to be over-diuresed.  Torsemide was cut back and Entresto was decreased.   Echo in 8/21 showed EF 30-35%, no significant residual VSD, normal RV.    Admitted 3/23 with a/c respiratory failure with hypoxia secondary to a/c CHF and noncompliance with CPAP for OSA/OHS. PICC line placed. Co-ox okay. He was enrolled in FASTR trial for diuresis. Diuresed 24 lb. FASTR trial discontinued on 03/11. Transitioned to torsemide 20 mg bid (prior home dose). GDMT titrated.  R/LHC on 03/13 with normal coronaries, normal filling pressures and normal CO. Mild pulmonary HTN. Referred to paramedicine. HH PT/OT recommended. Patient declined [place].  Follow up 4/23, stable NYHA II, not volume overloaded.   Re-admitted 5/23 with sepsis 2/2 right lower extremity  cellulitis. Started on abx and IVF. Required NE and transferred to ICU. Echo this admit showed EF 35% w/ global HK, no residual VSD, RV normal w/ mildly elevated RVSP, 36 mmHG, trivial MR/TR, AoV not well visualized, no vegetations noted. AHF consulted, underwent TEE showing 0.5 x 0.9 cm vegetation on the ICD lead, no valvular vegetation noted. Subsequently had ICD extraction, with plans for IV cefazolin via PICC x 6 weeks. LifeVest placed and discharged home, weight 231 lbs.  Today he returns for HF follow up.Overall feeling fine. SOB with inclines and steps. Denies PND/Orthopnea. Appetite ok. No fever or chills. Weight at home 234-235 stable. Using CPAP every now an then.  Wearing Life Vest. Taking all medications. Followed by HF Paramedicine.   Zoll - He has only worn Life Vest 2 days over the last month. Average steps 295. Average heart rate 92 bpm   Labs (1/16): K 4.1, creatinine 0.9, TSH normal, LDL 89, HDL 46 Labs (2/16): K 4.1 Creatinine 0.97  Labs (3/16): BNP 54, K 4.4, creatinine 1.0 Labs 5/16): K 4.0 Creatinine 0.93  Labs (6/16): digoxin < 0.2 Labs (9/16): K 3.7, creatinine 0.93, digoxin < 0.5, HCT 40.4 Labs (10/17): dig 0.5 K 4.1 Creatinine 0.94  Labs (6/19): K 4.3 Creatinine 0.95 Labs (3/20): K 4.3, creatinine 1.02, digoxin < 0.2, LDL 79 Labs (9/20): K 3.8, creatinine 0.82, digoxin < 0.2 Labs (7/21): K 3.8, creatinine 1.0 => 2.34 => 1.08,  BNP 81, TSH normal Labs (9/21): digoxin 0.5, K 4.3, creatinine 1.12 Labs (5/22): digoxin 0.2, K 4, creatinine 1.06 Labs (4/23): K 3.8, creatinine 1.10 Labs (5/23): K 3.9, creatinine 1.12 Labs (7/23): K 4.2, creatinine 1.55, hgb 12    PMH: 1. VSD repair at 1.  2. Type 2 diabetes. 3. HTN 4. OSA: Not using CPAP.  5. Chronic systolic CHF: Nonischemic cardiomyopathy.  St Jude ICD.  - Echo 2009: EF 35-40% - LHC 2009: No angiographic coronary disease.  - Echo 9/11: LVEF 20-25%  - Echo 11/12: LVEF 30-35%, diffuse hypokinesis and mild LVH. RV  systolic mildly reduced. LA mildly dilated. Pulmonic valve with mild stenosis.  - Echo 05/12/2014: EF 15-20% Grade IDD, LV moderately dilated.  RV normal. Mild pulmonc stenosis mean peak gradient 14.  - Echo 7/16: EF 25-30%, mild LV dilation, mild LVH, trivial pulmonic stenosis with peak gradient 11 mmHg, mildly decreased RV function with mild RV dilation.   - CPX (3/16): RER 1.03, peak VO2 19.9, VE/VCO2 slope 18, mild to moderately impaired functional capacity.  There was moderate to severe mixed restrictive/obstructive pattern on spirometry => exertional hypoxemia, oxygen saturation decreased to 84%.  - RHC (4/16): mean RA 9, PA 35/12, mean PCWP 8, CI 1.8 Fick, CI 2.4 thermo.  - Echo 02/2106: EF 30-35% Grade IIDD  - Echo 3/20: EF 25%, mild LV dilation, diffuse hypokinesis, mildly decreased RV systolic function with mild RV dilation, no definite PS noted.  - CPX (10/20): VE/VCO2 16, peak VO2 15.8 (57% predicted), RER 1.08.  Moderate functional limitation primarily due to restrictive lung disease.  Severe restriction on PFTs.  - Echo (8/21): EF 30-35%, no significant residual VSD, normal RV.  - LHC/RHC (3/23): normal coronaries; mean RA 6, PA 47/27, mean PCWP 14, CI 3.28 - Echo (5/23): EF 35% w/ global HK, no residual VSD, RV normal w/ mildly elevated RVSP, 36 mmHG. Trival MR/TR. AoV not well visualized. No vegetations noted. - TEE (5/23): EF 35-40%, global hypokinesis, ICD vegetation, mild PS peak gradient 13 mmHg.  6. Pulmonic stenosis: Mild, peak gradient 13 mmHg on TEE in 5/23.  7. High resolution CT chest in 8/21: No evidence for ILD.  8. MSSA  bacteremia: TEE 09/04/21 showed 0.5 x 0.9 cm vegetation on the ICD lead as it traverses the RA. No valvular vegetation noted. - s/p ICD extraction 5/23.  Current Outpatient Medications  Medication Sig Dispense Refill   ascorbic acid (VITAMIN C) 500 MG tablet Take 500 mg by mouth daily.     carvedilol (COREG) 6.25 MG tablet Take 1 tablet (6.25 mg total)  by mouth 2 (two) times daily with a meal. 180 tablet 3   dapagliflozin propanediol (FARXIGA) 10 MG TABS tablet Take 1 tablet (10 mg total) by mouth daily. 90 tablet 3   digoxin (LANOXIN) 0.125 MG tablet Take 1 tablet (125 mcg total) by mouth daily. 90 tablet 2   fluticasone furoate-vilanterol (BREO ELLIPTA) 100-25 MCG/ACT AEPB Inhale 1 puff into the lungs daily. 60 each 11   potassium chloride SA (KLOR-CON M) 20 MEQ tablet Take 2 tablets (40 mEq total) by mouth daily. 180 tablet 3   sacubitril-valsartan (ENTRESTO) 49-51 MG Take 1 tablet by mouth 2 (two) times daily. 180 tablet 3   simvastatin (ZOCOR) 20 MG tablet Take 1 tablet (20 mg total) by mouth at bedtime. 90 tablet 3   spironolactone (ALDACTONE) 25 MG tablet Take 1 tablet (25 mg total) by mouth daily. 30 tablet 1   torsemide (DEMADEX)  20 MG tablet Take 2 tablets (40 mg total) by mouth every morning AND 1 tablet (20 mg total) every evening. 200 tablet 3   No current facility-administered medications for this encounter.    Allergies:   Metolazone   Social History:  The patient  reports that he quit smoking about 10 years ago. His smoking use included cigarettes. He has a 4.50 pack-year smoking history. He has never used smokeless tobacco. He reports that he does not drink alcohol and does not use drugs.   Family History:  The patient's family history includes Diabetes in his maternal grandfather and paternal grandmother; Lung cancer in his father; Stroke in his father and mother.   ROS:  Please see the history of present illness.   All other systems are personally reviewed and negative.   Exam:   BP 90/62   Pulse 81   Wt 108.9 kg (240 lb)   SpO2 95%   BMI 38.74 kg/m  Wt Readings from Last 3 Encounters:  12/05/21 108.9 kg (240 lb)  11/19/21 106.6 kg (235 lb)  11/04/21 110.2 kg (243 lb)   General:  Well appearing. No resp difficulty. Wearing Life Vest HEENT: normal Neck: supple. no JVD. Carotids 2+ bilat; no bruits. No  lymphadenopathy or thryomegaly appreciated. Cor: PMI nondisplaced. Regular rate & rhythm. No rubs, gallops or murmurs. Lungs: clear Abdomen: soft, nontender, nondistended. No hepatosplenomegaly. No bruits or masses. Good bowel sounds. Extremities: no cyanosis, clubbing, rash, edema Neuro: alert & orientedx3, cranial nerves grossly intact. moves all 4 extremities w/o difficulty. Affect pleasant  Recent Labs: 06/19/2021: TSH 0.984 09/09/2021: Magnesium 2.4 09/17/2021: B Natriuretic Peptide 8.6 10/09/2021: ALT 6; Hemoglobin 12.4; Platelets 158 11/14/2021: BUN 17; Creatinine, Ser 1.16; Potassium 4.2; Sodium 138  Personally reviewed   Wt Readings from Last 3 Encounters:  12/05/21 108.9 kg (240 lb)  11/19/21 106.6 kg (235 lb)  11/04/21 110.2 kg (243 lb)   ASSESSMENT AND PLAN: 1. Chronic systolic CHF: Nonischemic cardiomyopathy.  RHC in 4/16 showed preserved cardiac index by thermodilution but low by Fick.   Last echo in 8/21 showed EF 30-35%, normal RV, no residual VSD, no definite pulmonic stenosis.  Echo in 3/23 showed EF 30-35%, mild LVH, no VSD, no significant pulmonary stenosis, moderate RVE with normal RV systolic function. LHC/RHC in 3/23 with no coronary disease, CI 3.28.  He had a [employer] ICD. Echo during recent admission (5/23), looked similar to 3/23.  TEE (5/23) showed LV EF 35-40%, mild RV dilation with moderate RV dysfunction, mild PS (mean gradient 13 mmHg), no VSD, there is a small 0.5 x 0.9 cm vegetation on the ICD lead as it traverses the RA, no valvular vegetation noted.  Now, s/p ICD extraction with Lifevest . Wearing 5 hours over the last month. Discussed importance of wearing Life Vest and that he is only protected if worn.  NYH IN Volume status low. Continue torsemide 40 mg daily and stop evening dose. .  -Continue Coreg to 6.25 mg bid.  - Continue Entresto 49/51 bid. - Continue spironolactone 25 mg daily.  - Continue digoxin 0.125 daily.   - Continue Farxiga 10 mg daily.  - BMET  stable.   - He follows with EP, plan for cardiac MRI to quantify EF in September. .  If EF < 35%, will plan subcutaneous ICD.  He will continue Lifevest for now, needs to wear consistently. .  2. H/o MSSA bacteremia: ? Source from RLE cellulitis. Had St Jude ICD => TEE 09/04/21  showed 0.5 x 0.9 cm vegetation on the ICD lead as it traverses the RA, no valvular vegetation noted. ICD extracted 09/05/21.  He has completed cefazolin.  - He will get cMRI in Sept, if EF < 35% will arrange for subcutaneous ICD.  3. H/o VSD: No residual shunt noted 3/23 echo.  Last RHC also did not suggest any significant left to right shunting. TEE 5/23 showed no definite VSD.  4. Pulmonary stenosis: Mild on TEE in 5/23, peak gradient 13 mmHg.  5. OSA: Needs use CPAP every night.   Follow up in  6 weeks with APP. For now will continue HF Paramedicine.      Zamia Tyminski NP-C  12/05/2021

## 2021-12-11 DIAGNOSIS — T827XXA Infection and inflammatory reaction due to other cardiac and vascular devices, implants and grafts, initial encounter: Secondary | ICD-10-CM | POA: Diagnosis not present

## 2021-12-17 ENCOUNTER — Telehealth (HOSPITAL_COMMUNITY): Payer: Self-pay

## 2021-12-17 NOTE — Telephone Encounter (Signed)
Attempted to make an appointment in the home with Kevin Cardenas today-- he declined reporting he would need to wait until next week on Monday. I agreed with plan and we will follow up in home on Monday 9/11.  [name], [place] 12/17/2021

## 2021-12-23 ENCOUNTER — Telehealth (HOSPITAL_COMMUNITY): Payer: Self-pay

## 2021-12-23 NOTE — Telephone Encounter (Signed)
Called Rayhaan to confirm home visit for today and he reports he is out running errands and won't be home until after 8:00pm. I asked if we could reschedule for Thursday afternoon and he agreed. Call complete.   [name], [place] 12/23/2021

## 2021-12-27 ENCOUNTER — Telehealth (HOSPITAL_COMMUNITY): Payer: Self-pay | Admitting: *Deleted

## 2021-12-27 NOTE — Telephone Encounter (Signed)
Reaching out to patient to offer assistance regarding upcoming cardiac imaging study; pt verbalizes understanding of appt date/time, parking situation and where to check in, and verified current allergies; name and call back number provided for further questions should they arise  Gordy Clement RN [facility] [phone] office [phone] cell  Patient denies claustrophobia or metal.

## 2021-12-30 ENCOUNTER — Ambulatory Visit (HOSPITAL_COMMUNITY)
Admission: RE | Admit: 2021-12-30 | Discharge: 2021-12-30 | Disposition: A | Discharge status: Home / Self Care | Payer: Medicare Other | Source: Ambulatory Visit | Attending: Cardiology | Admitting: Cardiology

## 2021-12-30 ENCOUNTER — Other Ambulatory Visit: Payer: Self-pay | Admitting: Cardiology

## 2021-12-30 DIAGNOSIS — I428 Other cardiomyopathies: Secondary | ICD-10-CM

## 2021-12-30 DIAGNOSIS — T827XXD Infection and inflammatory reaction due to other cardiac and vascular devices, implants and grafts, subsequent encounter: Secondary | ICD-10-CM | POA: Diagnosis not present

## 2021-12-30 DIAGNOSIS — I5020 Unspecified systolic (congestive) heart failure: Secondary | ICD-10-CM | POA: Diagnosis not present

## 2021-12-30 DIAGNOSIS — R7881 Bacteremia: Secondary | ICD-10-CM

## 2021-12-30 DIAGNOSIS — Q210 Ventricular septal defect: Secondary | ICD-10-CM | POA: Diagnosis not present

## 2021-12-30 DIAGNOSIS — B9561 Methicillin susceptible Staphylococcus aureus infection as the cause of diseases classified elsewhere: Secondary | ICD-10-CM | POA: Diagnosis not present

## 2021-12-30 MED ORDER — GADOBUTROL 1 MMOL/ML IV SOLN
10.0000 mL | Freq: Once | INTRAVENOUS | Status: AC | PRN
  Administered 2021-12-30: 10 mL via INTRAVENOUS

## 2022-01-01 ENCOUNTER — Other Ambulatory Visit (HOSPITAL_COMMUNITY): Payer: Self-pay

## 2022-01-01 DIAGNOSIS — I5022 Chronic systolic (congestive) heart failure: Secondary | ICD-10-CM

## 2022-01-01 MED ORDER — SPIRONOLACTONE 25 MG PO TABS
25.0000 mg | ORAL_TABLET | Freq: Every day | ORAL | 3 refills | Status: DC
  Filled 2022-01-01: qty 90, 90d supply, fill #0
  Filled 2022-04-11: qty 90, 90d supply, fill #1
  Filled 2022-06-16 – 2022-07-18 (×2): qty 90, 90d supply, fill #2
  Filled 2022-10-15: qty 90, 90d supply, fill #3

## 2022-01-01 NOTE — Progress Notes (Signed)
Paramedicine Encounter    Patient ID: Kevin Cardenas, male    DOB: 1975-07-17, 46 y.o.   MRN: [id number]   Arrived for home visit for Kevin Cardenas who was seated in his chair alert and oriented, not wearing his Life Vest. He reports he has been trying to wear it sometimes. He had a cardiac MRI yesterday. He reports he has had no shortness of breath, dizziness, chest pain or swelling. Vitals and assessment obtained today.   Wt- 233lbs BP- 110/80 HR- 56 RR- 16 O2- 90%  I reviewed meds. He filled his own pill box. It was 100% accurate for one week. He was missing spironolactone in 5 days, he reports he ran out of same. I called in for refills of same.   We reviewed appointments and confirmed same. I plant to follow up in two weeks at the clinic visit. He agreed with plans.   [name], [place] [phone] 01/01/2022   Patient Care Team: Dorna Mai, MD as PCP - General (Family Medicine) Larey Dresser, MD as PCP - Advanced Heart Failure (Cardiology) Larey Dresser, MD as PCP - Cardiology (Cardiology) Vickie Epley, MD as PCP - Electrophysiology (Cardiology)  Patient Active Problem List   Diagnosis Date Noted   PICC (peripherally inserted central catheter) in place 09/20/2021   Hypotension 09/20/2021   Aicd lead infection ([place]) 09/19/2021   Medication monitoring encounter 09/19/2021   Acute on chronic systolic and diastolic heart failure, NYHA class 3 ([place]) 06/19/2021   HFrEF (heart failure with reduced ejection fraction) ([place]) 11/06/2019   AKI (acute kidney injury) ([place]) 11/05/2019   Orthostatic hypotension 11/04/2019   Class 2 obesity due to excess calories with serious comorbidity in adult 01/16/2016   Asthma, stable, mild intermittent 01/15/2016   OSA on CPAP 06/21/2014   Routine general medical examination at a health care facility 04/18/2014   Hyperlipidemia with target LDL less than 100 04/17/2014   Non-ischemic cardiomyopathy ([place]) 19/37/9024    Systolic CHF, chronic ([place]) 02/06/2011   VSD (ventricular septal defect) 02/06/2011   HTN (hypertension) 02/06/2011   Prediabetes 01/30/2011    Current Outpatient Medications:    ascorbic acid (VITAMIN C) 500 MG tablet, Take 500 mg by mouth daily., Disp: , Rfl:    carvedilol (COREG) 6.25 MG tablet, Take 1 tablet (6.25 mg total) by mouth 2 (two) times daily with a meal., Disp: 180 tablet, Rfl: 3   dapagliflozin propanediol (FARXIGA) 10 MG TABS tablet, Take 1 tablet (10 mg total) by mouth daily., Disp: 90 tablet, Rfl: 3   digoxin (LANOXIN) 0.125 MG tablet, Take 1 tablet (125 mcg total) by mouth daily., Disp: 90 tablet, Rfl: 2   fluticasone furoate-vilanterol (BREO ELLIPTA) 100-25 MCG/ACT AEPB, Inhale 1 puff into the lungs daily., Disp: 60 each, Rfl: 11   potassium chloride SA (KLOR-CON M) 20 MEQ tablet, Take 2 tablets (40 mEq total) by mouth daily., Disp: 180 tablet, Rfl: 3   sacubitril-valsartan (ENTRESTO) 49-51 MG, Take 1 tablet by mouth 2 (two) times daily., Disp: 180 tablet, Rfl: 3   simvastatin (ZOCOR) 20 MG tablet, Take 1 tablet (20 mg total) by mouth at bedtime., Disp: 90 tablet, Rfl: 3   spironolactone (ALDACTONE) 25 MG tablet, Take 1 tablet (25 mg total) by mouth daily., Disp: 30 tablet, Rfl: 1   torsemide (DEMADEX) 20 MG tablet, Take 2 tablets (40 mg total) by mouth daily., Disp: 60 tablet, Rfl: 3 Allergies  Allergen Reactions   Metolazone Other (See Comments)    "Dried up  my kidneys- took only one tablet"     Social History   Socioeconomic History   Marital status: Single    Spouse name: Recently Married    Number of children: 0   Years of education: Not on file   Highest education level: Not on file  Occupational History   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   Tobacco Use   Smoking status: Former    Packs/day: 0.30    Years: 15.00    Total pack years: 4.50    Types: Cigarettes    Quit date: 04/08/2011    Years  since quitting: 10.7   Smokeless tobacco: Never   Tobacco comments:    Smokes depends on who is around him.    Substance and Sexual Activity   Alcohol use: No    Alcohol/week: 0.0 standard drinks of alcohol    Comment: Rarely    Drug use: No   Sexual activity: Not Currently  Other Topics Concern   Not on file  Social History Narrative   Caffienated drinks-no   Seat belt use often-no   Regular Exercise-   Smoke alarm in the home-yes   Firearms/guns in the home-no   History of physical abuse-no               Social Determinants of Health   Financial Resource Strain: Not on file  Food Insecurity: No Food Insecurity (06/24/2021)   Hunger Vital Sign    Worried About Running Out of Food in the Last Year: Never true    Ran Out of Food in the Last Year: Never true  Transportation Needs: No Transportation Needs (06/24/2021)   PRAPARE - Hydrologist (Medical): No    Lack of Transportation (Non-Medical): No  Physical Activity: Not on file  Stress: Not on file  Social Connections: Not on file  Intimate Partner Violence: Not on file    Physical Exam      Future Appointments  Date Time Provider [place]  01/14/2022  2:30 PM [facility] PA/NP [facility] None  03/10/2022  1:30 PM [place], PA-C [facility]  04/21/2022  3:20 PM Dorna Mai, MD [facility] None     ACTION: Home visit completed

## 2022-01-02 ENCOUNTER — Other Ambulatory Visit (HOSPITAL_COMMUNITY): Payer: Self-pay

## 2022-01-02 NOTE — Progress Notes (Signed)
Paramedicine Encounter    Patient ID: Kevin Cardenas, male    DOB: [birth year], 46 y.o.   MRN: [id number]  Called in refill for medications and picked up at [facility]. Delivered Spironolactone to [facility].   Visit complete.    ACTION: Home visit completed

## 2022-01-11 DIAGNOSIS — T827XXA Infection and inflammatory reaction due to other cardiac and vascular devices, implants and grafts, initial encounter: Secondary | ICD-10-CM | POA: Diagnosis not present

## 2022-01-13 ENCOUNTER — Telehealth (HOSPITAL_COMMUNITY): Payer: Self-pay

## 2022-01-13 NOTE — Telephone Encounter (Signed)
Called to confirm/remind patient of their appointment at the [facility] on 01/14/22.   Patient reminded to bring all medications and/or complete list.  Confirmed patient has transportation. Gave directions, instructed to utilize [place] parking.  Confirmed appointment prior to ending call.

## 2022-01-13 NOTE — Progress Notes (Signed)
ID:  Kevin KIMMONS, DOB 10-27-1975, MRN [id number]   Provider location: [facility] Type of Visit: Established patient  PCP:  [name], MD  HF Cardiologist:  [name] EP: Dr Curt Bears.    HPI: Kevin Cardenas is a 46 y.o. male who has a history of VSD repair and removal of a pulmonary artery band at age 89, DM, HTN, systolic heart failure, NICM and OSA diagnosed in past but not on CPAP. Had St Jude ICD (placed 01/2015). Removed ICD due to endocarditis.   Echo in 3/20 showed EF 25%, mild LV dilation, mildly decreased RV systolic function.   CPX in 10/20 showed moderate functional limitation primarily due to restrictive lung disease.  I had planned to get PFTs and a high resolution CT chest to followup on lung restriction but was never scheduled.   At last appointment, he had been off his meds for several months but supposedly had restarted them.  Weight was up 20 lbs and he was volume overloaded.  I stopped Lasix and started torsemide, kept other meds as ordered.  He came to the ER later in 7/21 with a bleeding varicose vein but was noted to be hypotensive with AKI.  He was thought to be over-diuresed.  Torsemide was cut back and Entresto was decreased.   Echo in 8/21 showed EF 30-35%, no significant residual VSD, normal RV.    Admitted 3/23 with a/c respiratory failure with hypoxia secondary to a/c CHF and noncompliance with CPAP for OSA/OHS. PICC line placed. Co-ox okay. He was enrolled in FASTR trial for diuresis. Diuresed 24 lb. FASTR trial discontinued on 03/11. Transitioned to torsemide 20 mg bid (prior home dose). GDMT titrated.  R/LHC on 03/13 with normal coronaries, normal filling pressures and normal CO. Mild pulmonary HTN. Referred to paramedicine. HH PT/OT recommended. Patient declined [place].  Follow up 4/23, stable NYHA II, not volume overloaded.   Re-admitted 5/23 with sepsis 2/2 right lower extremity cellulitis. Started on abx  and IVF. Required NE and transferred to ICU. Echo this admit showed EF 35% w/ global HK, no residual VSD, RV normal w/ mildly elevated RVSP, 36 mmHG, trivial MR/TR, AoV not well visualized, no vegetations noted. AHF consulted, underwent TEE showing 0.5 x 0.9 cm vegetation on the ICD lead, no valvular vegetation noted. Subsequently had ICD extraction, with plans for IV cefazolin via PICC x 6 weeks. LifeVest placed and discharged home, weight 231 lbs.  Cardiac MRI 9/23 showed improved LVEF 48%, RVEF 40%, nonspecific inferior RV insertion site LGE (suggestive of volume overload), no finding suggestive of cardia amyloidosis.  Today he returns for HF follow up with paramedic, [name]. Overall feeling fine. He is excited that EF has improved and he does not need an ICD. He does not have dyspnea walking on flat ground or carrying groceries. Denies palpitations, CP, dizziness, edema, or PND/Orthopnea. Chronically sleeps on 2-3 pillows. Appetite ok. No fever or chills. Weight at home 231-233 pounds. Taking all medications.   Labs (1/16): K 4.1, creatinine 0.9, TSH normal, LDL 89, HDL 46 Labs (2/16): K 4.1 Creatinine 0.97  Labs (3/16): BNP 54, K 4.4, creatinine 1.0 Labs 5/16): K 4.0 Creatinine 0.93  Labs (6/16): digoxin < 0.2 Labs (9/16): K 3.7, creatinine 0.93, digoxin < 0.5, HCT 40.4 Labs (10/17): dig 0.5 K 4.1 Creatinine 0.94  Labs (6/19): K 4.3 Creatinine 0.95 Labs (3/20): K 4.3, creatinine 1.02, digoxin < 0.2, LDL  79 Labs (9/20): K 3.8, creatinine 0.82, digoxin < 0.2 Labs (7/21): K 3.8, creatinine 1.0 => 2.34 => 1.08, BNP 81, TSH normal Labs (9/21): digoxin 0.5, K 4.3, creatinine 1.12 Labs (5/22): digoxin 0.2, K 4, creatinine 1.06 Labs (4/23): K 3.8, creatinine 1.10 Labs (5/23): K 3.9, creatinine 1.12 Labs (7/23): K 4.2, creatinine 1.55, hgb 12 Labs (8/23): K 4.2, creatinine 1.16    PMH: 1. VSD repair at 1.  2. Type 2 diabetes. 3. HTN 4. OSA: Not using CPAP.  5. Chronic systolic CHF:  Nonischemic cardiomyopathy.  St Jude ICD.  - Echo 2009: EF 35-40% - LHC 2009: No angiographic coronary disease.  - Echo 9/11: LVEF 20-25%  - Echo 11/12: LVEF 30-35%, diffuse hypokinesis and mild LVH. RV systolic mildly reduced. LA mildly dilated. Pulmonic valve with mild stenosis.  - Echo 05/12/2014: EF 15-20% Grade IDD, LV moderately dilated.  RV normal. Mild pulmonc stenosis mean peak gradient 14.  - Echo 7/16: EF 25-30%, mild LV dilation, mild LVH, trivial pulmonic stenosis with peak gradient 11 mmHg, mildly decreased RV function with mild RV dilation.   - CPX (3/16): RER 1.03, peak VO2 19.9, VE/VCO2 slope 18, mild to moderately impaired functional capacity.  There was moderate to severe mixed restrictive/obstructive pattern on spirometry => exertional hypoxemia, oxygen saturation decreased to 84%.  - RHC (4/16): mean RA 9, PA 35/12, mean PCWP 8, CI 1.8 Fick, CI 2.4 thermo.  - Echo 02/2106: EF 30-35% Grade IIDD  - Echo 3/20: EF 25%, mild LV dilation, diffuse hypokinesis, mildly decreased RV systolic function with mild RV dilation, no definite PS noted.  - CPX (10/20): VE/VCO2 16, peak VO2 15.8 (57% predicted), RER 1.08.  Moderate functional limitation primarily due to restrictive lung disease.  Severe restriction on PFTs.  - Echo (8/21): EF 30-35%, no significant residual VSD, normal RV.  - LHC/RHC (3/23): normal coronaries; mean RA 6, PA 47/27, mean PCWP 14, CI 3.28 - Echo (5/23): EF 35% w/ global HK, no residual VSD, RV normal w/ mildly elevated RVSP, 36 mmHG. Trival MR/TR. AoV not well visualized. No vegetations noted. - TEE (5/23): EF 35-40%, global hypokinesis, ICD vegetation, mild PS peak gradient 13 mmHg.  - Cardiac MRI (9/23):  LVEF 48%, RVEF 40%, nonspecific inferior RV insertion site LGE (suggestive of volume overload), no finding suggestive of cardia amyloidosis. 6. Pulmonic stenosis: Mild, peak gradient 13 mmHg on TEE in 5/23.  7. High resolution CT chest in 8/21: No evidence for ILD.   8. MSSA  bacteremia: TEE 09/04/21 showed 0.5 x 0.9 cm vegetation on the ICD lead as it traverses the RA. No valvular vegetation noted. - s/p ICD extraction 5/23.  Current Outpatient Medications  Medication Sig Dispense Refill   ascorbic acid (VITAMIN C) 500 MG tablet Take 500 mg by mouth daily.     carvedilol (COREG) 6.25 MG tablet Take 1 tablet (6.25 mg total) by mouth 2 (two) times daily with a meal. 180 tablet 3   dapagliflozin propanediol (FARXIGA) 10 MG TABS tablet Take 1 tablet (10 mg total) by mouth daily. 90 tablet 3   digoxin (LANOXIN) 0.125 MG tablet Take 1 tablet (125 mcg total) by mouth daily. 90 tablet 2   fluticasone furoate-vilanterol (BREO ELLIPTA) 100-25 MCG/ACT AEPB Inhale 1 puff into the lungs daily. 60 each 11   potassium chloride SA (KLOR-CON M) 20 MEQ tablet Take 2 tablets (40 mEq total) by mouth daily. 180 tablet 3   sacubitril-valsartan (ENTRESTO) 49-51 MG Take 1 tablet by  mouth 2 (two) times daily. 180 tablet 3   simvastatin (ZOCOR) 20 MG tablet Take 1 tablet (20 mg total) by mouth at bedtime. 90 tablet 3   spironolactone (ALDACTONE) 25 MG tablet Take 1 tablet (25 mg total) by mouth daily. 90 tablet 3   torsemide (DEMADEX) 20 MG tablet Take 2 tablets (40 mg total) by mouth daily. 60 tablet 3   No current facility-administered medications for this encounter.    Allergies:   Metolazone   Social History:  The patient  reports that he quit smoking about 10 years ago. His smoking use included cigarettes. He has a 4.50 pack-year smoking history. He has never used smokeless tobacco. He reports that he does not drink alcohol and does not use drugs.   Family History:  The patient's family history includes Diabetes in his maternal grandfather and paternal grandmother; Lung cancer in his father; Stroke in his father and mother.   ROS:  Please see the history of present illness.   All other systems are personally reviewed and negative.  Recent Labs: 06/19/2021: TSH  0.984 09/09/2021: Magnesium 2.4 09/17/2021: B Natriuretic Peptide 8.6 10/09/2021: ALT 6; Hemoglobin 12.4; Platelets 158 11/14/2021: BUN 17; Creatinine, Ser 1.16; Potassium 4.2; Sodium 138  Personally reviewed   Wt Readings from Last 3 Encounters:  01/14/22 107.2 kg (236 lb 6.4 oz)  01/01/22 105.7 kg (233 lb)  12/05/21 108.9 kg (240 lb)   BP 98/66   Pulse (!) 102   Wt 107.2 kg (236 lb 6.4 oz)   SpO2 95%   BMI 38.16 kg/m   Physical Exam General:  NAD. No resp difficulty HEENT: Normal Neck: Supple. No JVD, thick neck. Carotids 2+ bilat; no bruits. No lymphadenopathy or thryomegaly appreciated. Cor: PMI nondisplaced. Regular rate & rhythm. No rubs, gallops, 1-2/6 SEM USB Lungs: Clear Abdomen: Soft, nontender, nondistended. No hepatosplenomegaly. No bruits or masses. Good bowel sounds. Extremities: No cyanosis, clubbing, rash, edema Neuro: Alert & oriented x 3, cranial nerves grossly intact. Moves all 4 extremities w/o difficulty. Affect pleasant.  Assessment/Plan 1. Chronic systolic CHF: Nonischemic cardiomyopathy.  RHC in 4/16 showed preserved cardiac index by thermodilution but low by Fick.   Last echo in 8/21 showed EF 30-35%, normal RV, no residual VSD, no definite pulmonic stenosis.  Echo in 3/23 showed EF 30-35%, mild LVH, no VSD, no significant pulmonary stenosis, moderate RVE with normal RV systolic function. LHC/RHC in 3/23 with no coronary disease, CI 3.28.  He had a [employer] ICD. Echo during recent admission (5/23), looked similar to 3/23.  TEE (5/23) showed LV EF 35-40%, mild RV dilation with moderate RV dysfunction, mild PS (mean gradient 13 mmHg), no VSD, there is a small 0.5 x 0.9 cm vegetation on the ICD lead as it traverses the RA, no valvular vegetation noted.  Now, s/p ICD extraction. Cardiac MRI (9/23) showed improved LVEF 48%, RVEF 40%. NYHA I-II, he is not volume overloaded today. Overall doing very well.  - Stop digoxin with improved EF. - Continue torsemide 40 mg daily.  BMEt today. - Continue Coreg 6.25 mg bid.  - Continue Entresto 49/51 bid. - Continue spironolactone 25 mg daily.  - Continue Farxiga 10 mg daily.  2. H/o MSSA bacteremia: ? Source from RLE cellulitis. Had St Jude ICD => TEE 09/04/21 showed 0.5 x 0.9 cm vegetation on the ICD lead as it traverses the RA, no valvular vegetation noted. ICD extracted 09/05/21.  He has completed cefazolin.  3. H/o VSD: No residual shunt noted  3/23 echo.  Last RHC also did not suggest any significant left to right shunting. TEE 5/23 showed no definite VSD.  4. Pulmonary stenosis: Mild on TEE in 5/23, peak gradient 13 mmHg.  5. OSA: Wears CPAP.  OK to discharge from paramedicine as he has demonstrated medication compliance and EF improved.  Follow up in 3-4 months with [name].  Maricela Bo [facility] FNP-BC 01/14/2022

## 2022-01-14 ENCOUNTER — Ambulatory Visit (HOSPITAL_COMMUNITY)
Admission: RE | Admit: 2022-01-14 | Discharge: 2022-01-14 | Disposition: A | Discharge status: Home / Self Care | Payer: Medicare Other | Source: Ambulatory Visit | Attending: Family Medicine | Admitting: Family Medicine

## 2022-01-14 ENCOUNTER — Encounter (HOSPITAL_COMMUNITY): Payer: Self-pay

## 2022-01-14 ENCOUNTER — Other Ambulatory Visit (HOSPITAL_COMMUNITY): Payer: Self-pay

## 2022-01-14 VITALS — BP 98/66 | HR 102 | Wt 236.4 lb

## 2022-01-14 DIAGNOSIS — G4733 Obstructive sleep apnea (adult) (pediatric): Secondary | ICD-10-CM | POA: Diagnosis not present

## 2022-01-14 DIAGNOSIS — L03115 Cellulitis of right lower limb: Secondary | ICD-10-CM | POA: Diagnosis not present

## 2022-01-14 DIAGNOSIS — I5022 Chronic systolic (congestive) heart failure: Secondary | ICD-10-CM | POA: Insufficient documentation

## 2022-01-14 DIAGNOSIS — Q210 Ventricular septal defect: Secondary | ICD-10-CM

## 2022-01-14 DIAGNOSIS — B9561 Methicillin susceptible Staphylococcus aureus infection as the cause of diseases classified elsewhere: Secondary | ICD-10-CM | POA: Diagnosis not present

## 2022-01-14 DIAGNOSIS — Z79899 Other long term (current) drug therapy: Secondary | ICD-10-CM | POA: Insufficient documentation

## 2022-01-14 DIAGNOSIS — I370 Nonrheumatic pulmonary valve stenosis: Secondary | ICD-10-CM

## 2022-01-14 DIAGNOSIS — I110 Hypertensive heart disease with heart failure: Secondary | ICD-10-CM | POA: Insufficient documentation

## 2022-01-14 DIAGNOSIS — R7881 Bacteremia: Secondary | ICD-10-CM

## 2022-01-14 DIAGNOSIS — Z7984 Long term (current) use of oral hypoglycemic drugs: Secondary | ICD-10-CM | POA: Diagnosis not present

## 2022-01-14 LAB — BASIC METABOLIC PANEL
Anion gap: 8 (ref 5–15)
BUN: 15 mg/dL (ref 6–20)
CO2: 29 mmol/L (ref 22–32)
Calcium: 8.3 mg/dL — ABNORMAL LOW (ref 8.9–10.3)
Chloride: 101 mmol/L (ref 98–111)
Creatinine, Ser: 1.17 mg/dL (ref 0.61–1.24)
GFR, Estimated: >60 mL/min (ref >60)
Glucose, Bld: 159 mg/dL — ABNORMAL HIGH (ref 70–99)
Potassium: 3.4 mmol/L — ABNORMAL LOW (ref 3.5–5.1)
Sodium: 138 mmol/L (ref 135–145)

## 2022-01-14 NOTE — Progress Notes (Signed)
Paramedicine Encounter    Patient ID: Kevin Cardenas, male    DOB: May 10, 1975, 46 y.o.   MRN: [id number]  Met with [name] in clinic today where he reports feeling good. He denied any chest pain, shortness of breath, swelling, weight gain or dizziness. He has been compliant with meds- filling his own pill box and calling in his own refills. He had cardiac MRI which showed EF improvement and no need for LifeVest or ICD. Today we talked with [name] in clinic and she agreed with paramedicine graduation today.   She stopped digoxin and obtained labs today- clinic will call him if there are any changes.   He verbalized he feels comfortable with graduation as he has all resources he needs at present and has been successful and management on his own. He knows to reach out to HF clinic if needed for further needs if they should arise. Graduation from program complete.   Patient is now discharged from [facility].  Patient has/has not met the following goals:  Yes :Patient expresses basic understanding of medications and what they are for Yes :Patient able to verbalize heart failure specific dietary/fluid restrictions Yes :Patient is aware of who to call if they have medical concerns or if they need to schedule or change appts Yes :Patient has a scale for daily weights and weighs regularly Yes :Patient able to verbalize concerning symptoms when they should call the HF clinic (weight gain ranges, etc) Yes :Patient has a PCP and has seen within the past year or has upcoming appt Yes :Patient has reliable access to getting their medications Yes :Patient has shown they are able to reorder medications reliably No :Patient has had admission in past 30 days- if yes how many? No :Patient has had admission in past 90 days- if yes how many?  Discharge Comments:      Kevin Cardenas, [place] 01/14/2022     Patient Care Team: Dorna Mai, MD as PCP  - General (Family Medicine) Larey Dresser, MD as PCP - Advanced Heart Failure (Cardiology) Larey Dresser, MD as PCP - Cardiology (Cardiology) Vickie Epley, MD as PCP - Electrophysiology (Cardiology)  Patient Active Problem List   Diagnosis Date Noted   PICC (peripherally inserted central catheter) in place 09/20/2021   Hypotension 09/20/2021   Aicd lead infection ([place]) 09/19/2021   Medication monitoring encounter 09/19/2021   Acute on chronic systolic and diastolic heart failure, NYHA class 3 ([place]) 06/19/2021   HFrEF (heart failure with reduced ejection fraction) ([place]) 11/06/2019   AKI (acute kidney injury) ([place]) 11/05/2019   Orthostatic hypotension 11/04/2019   Class 2 obesity due to excess calories with serious comorbidity in adult 01/16/2016   Asthma, stable, mild intermittent 01/15/2016   OSA on CPAP 06/21/2014   Routine general medical examination at a health care facility 04/18/2014   Hyperlipidemia with target LDL less than 100 04/17/2014   Non-ischemic cardiomyopathy ([place]) 85/50/1586   Systolic CHF, chronic ([place]) 02/06/2011   VSD (ventricular septal defect) 02/06/2011   HTN (hypertension) 02/06/2011   Prediabetes 01/30/2011    Current Outpatient Medications:    ascorbic acid (VITAMIN C) 500 MG tablet, Take 500 mg by mouth daily., Disp: , Rfl:    carvedilol (COREG) 6.25 MG tablet, Take 1 tablet (6.25 mg total) by mouth 2 (two) times daily with a meal., Disp: 180 tablet, Rfl: 3   dapagliflozin propanediol (FARXIGA) 10 MG TABS tablet, Take 1 tablet (10 mg total) by mouth daily.,  Disp: 90 tablet, Rfl: 3   fluticasone furoate-vilanterol (BREO ELLIPTA) 100-25 MCG/ACT AEPB, Inhale 1 puff into the lungs daily., Disp: 60 each, Rfl: 11   potassium chloride SA (KLOR-CON M) 20 MEQ tablet, Take 2 tablets (40 mEq total) by mouth daily., Disp: 180 tablet, Rfl: 3   sacubitril-valsartan (ENTRESTO) 49-51 MG, Take 1 tablet by mouth 2 (two) times daily., Disp: 180 tablet, Rfl: 3    simvastatin (ZOCOR) 20 MG tablet, Take 1 tablet (20 mg total) by mouth at bedtime., Disp: 90 tablet, Rfl: 3   spironolactone (ALDACTONE) 25 MG tablet, Take 1 tablet (25 mg total) by mouth daily., Disp: 90 tablet, Rfl: 3   torsemide (DEMADEX) 20 MG tablet, Take 2 tablets (40 mg total) by mouth daily., Disp: 60 tablet, Rfl: 3 Allergies  Allergen Reactions   Metolazone Other (See Comments)    "Dried up my kidneys- took only one tablet"     Social History   Socioeconomic History   Marital status: Single    Spouse name: Recently Married    Number of children: 0   Years of education: Not on file   Highest education level: Not on file  Occupational History   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   Tobacco Use   Smoking status: Former    Packs/day: 0.30    Years: 15.00    Total pack years: 4.50    Types: Cigarettes    Quit date: 04/08/2011    Years since quitting: 10.7   Smokeless tobacco: Never   Tobacco comments:    Smokes depends on who is around him.    Substance and Sexual Activity   Alcohol use: No    Alcohol/week: 0.0 standard drinks of alcohol    Comment: Rarely    Drug use: No   Sexual activity: Not Currently  Other Topics Concern   Not on file  Social History Narrative   Caffienated drinks-no   Seat belt use often-no   Regular Exercise-   Smoke alarm in the home-yes   Firearms/guns in the home-no   History of physical abuse-no               Social Determinants of Health   Financial Resource Strain: Not on file  Food Insecurity: No Food Insecurity (06/24/2021)   Hunger Vital Sign    Worried About Running Out of Food in the Last Year: Never true    Ran Out of Food in the Last Year: Never true  Transportation Needs: No Transportation Needs (06/24/2021)   PRAPARE - Hydrologist (Medical): No    Lack of Transportation (Non-Medical): No  Physical Activity: Not on file  Stress: Not  on file  Social Connections: Not on file  Intimate Partner Violence: Not on file    Physical Exam      Future Appointments  Date Time Provider [place]  03/10/2022  1:30 PM Baldwin [place], PA-C [facility]  04/21/2022  3:20 PM Dorna Mai, MD [facility] None     ACTION: Home visit completed

## 2022-01-14 NOTE — Addendum Note (Signed)
Encounter addended by: Jorge Ny, LCSW on: 01/14/2022 4:29 PM  Actions taken: Flowsheet accepted

## 2022-01-14 NOTE — Patient Instructions (Addendum)
   Medication Changes:  Stop digoxin.  Lab Work:  Our office will give you a call with any abnormal labs.      Follow-Up in: 3-4 months with [name]  At the [facility], you and your health needs are our priority. We have a designated team specialized in the treatment of Heart Failure. This Care Team includes your primary Heart Failure Specialized Cardiologist (physician), Advanced Practice Providers (APPs- Physician Assistants and Nurse Practitioners), and Pharmacist who all work together to provide you with the care you need, when you need it.   You may see any of the following providers on your designated Care Team at your next follow up:  Dr. Glori Bickers Dr. [name] Dr. Roxana Hires, NP Lyda Jester, [place] [facility] [place], [place] Forestine Na, NP Audry Riles, PharmD   Please be sure to bring in all your medications bottles to every appointment.   Need to Contact us:  If you have any questions or concerns before your next appointment please send [place] a message through [place] or call our office at [phone].    TO LEAVE A MESSAGE FOR THE NURSE SELECT OPTION 2, PLEASE LEAVE A MESSAGE INCLUDING: YOUR NAME DATE OF BIRTH CALL BACK NUMBER REASON FOR CALL**this is important as we prioritize the call backs  YOU WILL RECEIVE A CALL BACK THE SAME DAY AS LONG AS YOU CALL BEFORE 4:00 PM

## 2022-01-16 ENCOUNTER — Telehealth (HOSPITAL_COMMUNITY): Payer: Self-pay | Admitting: Surgery

## 2022-01-16 NOTE — Telephone Encounter (Signed)
I called patient to review results and recommendations per provider.  He is taking Potassium 40 meq daily and he is aware to take take extra 40 meq today.  I reviewed Potassium rich foods to incorporate into his diet.

## 2022-01-16 NOTE — Telephone Encounter (Signed)
-----  Message from Rafael Bihari, [place] sent at 01/15/2022  8:24 AM EDT ----- K is borderline low. Please make sure he is taking 40 KCL daily. If so, take extra 40 x 1 and increase K in diet.

## 2022-01-28 ENCOUNTER — Telehealth: Payer: Self-pay | Admitting: Family Medicine

## 2022-01-28 NOTE — Telephone Encounter (Signed)
Copied from [place] [phone]. Topic: General - Other >> Jan 28, 2022 10:55 AM Everette C wrote: Reason for CRM: The patient would like to be contacted by a member of clinical staff when possible to review their most recent labs including their A1C  Please contact further when possible

## 2022-01-29 NOTE — Telephone Encounter (Signed)
Patient was called and all his lab questions were answer

## 2022-03-04 ENCOUNTER — Other Ambulatory Visit (HOSPITAL_COMMUNITY): Payer: Self-pay

## 2022-03-04 ENCOUNTER — Other Ambulatory Visit (HOSPITAL_COMMUNITY): Payer: Self-pay | Admitting: Cardiology

## 2022-03-04 DIAGNOSIS — E785 Hyperlipidemia, unspecified: Secondary | ICD-10-CM

## 2022-03-04 DIAGNOSIS — E118 Type 2 diabetes mellitus with unspecified complications: Secondary | ICD-10-CM

## 2022-03-04 MED ORDER — SIMVASTATIN 20 MG PO TABS
20.0000 mg | ORAL_TABLET | Freq: Every day | ORAL | 3 refills | Status: DC
  Filled 2022-03-04: qty 90, 90d supply, fill #0
  Filled 2022-06-16: qty 90, 90d supply, fill #1
  Filled 2022-09-18 (×3): qty 90, 90d supply, fill #2
  Filled 2022-12-22: qty 90, 90d supply, fill #3

## 2022-03-09 NOTE — Progress Notes (Signed)
Cardiology Office Note Date:  03/10/2022  Patient ID:  Kevin Cardenas, Kevin Cardenas [birth year], MRN [id number] PCP:  Dorna Mai, MD  Cardiologist:  [name] Electrophysiologist: Dr. Caryl Comes >> Dr. Quentin Ore   Chief Complaint:  6 mo  History of Present Illness: Kevin Cardenas is a 46 y.o. male with history of VSD repair and removal of a pulmonary artery band at age 86, DM, HTN, chronic CHF (systolic), NICM, OSA (not on CPAP), ICD.  Admitted 09/01/21 with fever 103.1, hypotension, MSSA bacteremia. Blood culture drawn before antibiotics were administered was positive for MSSA. HF team was on board for volume/meds. Source of infection suspect perhaps secondary to wound, poor dentition. There was a vegetation on the ICD lead by TEE ICD system was extracted 09/05/21 Discharged with Life vest 09/10/21  He saw Dr. Quentin Ore 10/10/21, Abx to complete soon, planned for c.MRI,  If the LV function is less than 35%, favor proceeding with subcutaneous ICD implant. If ejection fraction is greater than 35%, we will continue medical therapy and discontinue LifeVest   C.MRI LVEF 48%  He has followed with HF team since then. Most recently 01/14/22 with J. Sherri Sear 01/14/22, doing well, no changes were made.  Today he reports doing very well. Denies CP, palpitations, SOB. Tries to eat low salt diet, was able to maintain that for thanksgiving holiday. Takes torsemide 54m daily without issue. Weighs every morning on home scale. Home weight has been staying right around 230lb.   He denies syncope or presyncope. He has mailed in his lifevest.    Device information: [employer] Fortify Assura ICD implanted 01/19/2015 Single lead, is a BEngineer, [employer]iance SG implanted same day  EXTRACTED May 2023 2/2 MSSA bacteremia  Past Medical History:  Diagnosis Date   AICD (automatic cardioverter/defibrillator) present    Chronic systolic CHF (congestive heart failure) ([facility]    EF 25% by echo 06/2018   Diabetes  mellitus    Hypertension    Non-ischemic cardiomyopathy ([facility]    Obstructive sleep apnea    VSD (ventricular septal defect) 1978   repaired at age 53    Past Surgical History:  Procedure Laterality Date   EP IMPLANTABLE DEVICE N/A 01/19/2015   Procedure: ICD Implant;  Surgeon: SDeboraha Sprang MD;  [place] [facility];  Service: Cardiovascular;  Laterality: N/A;   ICD LEAD REMOVAL  09/05/2021   Procedure: ICD LASER LEAD REMOVAL;  Surgeon: LVickie Epley MD;  [place] [facility] [place]  Service: Cardiovascular;;   IMPLANTABLE CARDIOVERTER DEFIBRILLATOR (ICD) GENERATOR CHANGE N/A 09/05/2021   Procedure: DEFIBRILLATOR GENERATOR REMOVAL;  Surgeon: LVickie Epley MD;  [place]  Service: Cardiovascular;  Laterality: N/A;   RIGHT HEART CATHETERIZATION N/A 08/03/2014   Procedure: RIGHT HEART CATH;  Surgeon: DLarey Dresser MD;  [facility];  Service: Cardiovascular;  Laterality: N/A;   RIGHT/LEFT HEART CATH AND CORONARY ANGIOGRAPHY N/A 06/24/2021   Procedure: RIGHT/LEFT HEART CATH AND CORONARY ANGIOGRAPHY;  Surgeon: MLarey Dresser MD;  [place];  Service: Cardiovascular;  Laterality: N/A;   TEE WITHOUT CARDIOVERSION N/A 09/04/2021   Procedure: TRANSESOPHAGEAL ECHOCARDIOGRAM (TEE);  Surgeon: MLarey Dresser MD;  [facility];  Service: Cardiovascular;  Laterality: N/A;   TEE WITHOUT CARDIOVERSION N/A 09/05/2021   Procedure: TRANSESOPHAGEAL ECHOCARDIOGRAM (TEE);  Surgeon: LVickie Epley MD;  [facility];  Service: Cardiovascular;  Laterality: N/A;   VSD REPAIR      Current Outpatient Medications  Medication Sig  Dispense Refill   ascorbic acid (VITAMIN C) 500 MG tablet Take 500 mg by mouth daily.     carvedilol (COREG) 6.25 MG tablet Take 1 tablet (6.25 mg total) by mouth 2 (two) times daily with a meal. 180 tablet 3   dapagliflozin propanediol (FARXIGA) 10 MG TABS tablet Take 1 tablet (10 mg total) by mouth daily. 90 tablet 3    fluticasone furoate-vilanterol (BREO ELLIPTA) 100-25 MCG/ACT AEPB Inhale 1 puff into the lungs daily. 60 each 11   potassium chloride SA (KLOR-CON M) 20 MEQ tablet Take 2 tablets (40 mEq total) by mouth daily. 180 tablet 3   sacubitril-valsartan (ENTRESTO) 49-51 MG Take 1 tablet by mouth 2 (two) times daily. 180 tablet 3   simvastatin (ZOCOR) 20 MG tablet Take 1 tablet (20 mg total) by mouth at bedtime. 90 tablet 3   spironolactone (ALDACTONE) 25 MG tablet Take 1 tablet (25 mg total) by mouth daily. 90 tablet 3   torsemide (DEMADEX) 20 MG tablet Take 2 tablets (40 mg total) by mouth daily. 60 tablet 3   No current facility-administered medications for this visit.    Allergies:   Metolazone   Social History:  The patient  reports that he quit smoking about 10 years ago. His smoking use included cigarettes. He has a 4.50 pack-year smoking history. He has never used smokeless tobacco. He reports that he does not drink alcohol and does not use drugs.   Family History:  The patient's family history includes Diabetes in his maternal grandfather and paternal grandmother; Lung cancer in his father; Stroke in his father and mother.  ROS:  Please see the history of present illness.  All other systems are reviewed and otherwise negative.   PHYSICAL EXAM:  VS:  BP 100/68   Pulse 90   Ht _0  (1.676 m)   SpO2 95%   BMI 38.16 kg/m  BMI: Body mass index is 38.16 kg/m. Well nourished, well developed, in no acute distress  HEENT: normocephalic, atraumatic  Neck: no JVD when sitting upright, carotid bruits or masses Cardiac:  RRR; no significant murmurs, no rubs, or gallops Lungs:  CTA b/l, no wheezing, rhonchi or rales  Abd: soft, nontender MS: no deformity or atrophy Ext: 1+ BLL edema  Skin: warm and dry, no rash Neuro:  No gross deficits appreciated Psych: euthymic mood, full affect   EKG:  Not done today   12/30/21: c.MRI IMPRESSION: 1. Normal LV size with mild diffuse hypokinesis +  septal-lateral dyssynchrony, EF 48%.  2.  Normal RV size with EF 40%.  3.  Mild pulmonic insufficiency.  4. There was nonspecific inferior RV insertion site LGE, this is suggestive of pressure/volume overload.  5. Elevated extracellular volume percentage, not in the range suggestive of cardiac amyloidosis.   09/04/21: TEE 1. No residual VSD noted. Left ventricular ejection fraction, by  estimation, is 35 to 40%. The left ventricle has moderately decreased  function. The left ventricle demonstrates global hypokinesis. There is  mild left ventricular hypertrophy.   2. There is a 0.5 x 0.9 cm vegetation attached to the ICD lead as it  passes through the right atrium. Right ventricular systolic function is  moderately reduced. The right ventricular size is mildly enlarged.   3. Left atrial size was mildly dilated. No left atrial/left atrial  appendage thrombus was detected.   4. Right atrial size was mildly dilated.   5. No MV vegetation. The mitral valve is normal in structure. No evidence  of  mitral valve regurgitation. No evidence of mitral stenosis.   6. Peak RV-RA gradient 17 mmHg. No TV vegetation.   7. No aortic valve vegetation. The aortic valve is tricuspid. Aortic  valve regurgitation is not visualized. No aortic stenosis is present.   8. Peak pulmonic valve gradient 13 mmHg. No PV vegetation. Mild pulmonic  stenosis.   9. PFO by color doppler.    03/03/16: TTE Study Conclusions - Left ventricle: The cavity size was mildly dilated. There was   mild concentric hypertrophy. Systolic function was moderately to   severely reduced. The estimated ejection fraction was in the   range of 30% to 35%. Diffuse hypokinesis. Although no diagnostic   regional wall motion abnormality was identified, this possibility   cannot be completely excluded on the basis of this study.   Features are consistent with a pseudonormal left ventricular   filling pattern, with concomitant abnormal  relaxation and   increased filling pressure (grade 2 diastolic dysfunction). - Left atrium: The atrium was mildly dilated. - Right ventricle: The cavity size was mildly dilated. Systolic   function was reduced. - Pulmonary arteries: Systolic pressure was mildly increased. PA   peak pressure: 41 mm Hg (S).   Echo 9/11: LVEF 20-25%  Echo November 1st, 2012: LVEF 30-35%, diffuse hypokinesis and mild LVH. RV systolic mildly reduced. LA mildly dilated. Pulmonic valve with mild stenosis.  Echo 05/12/2014: EF 15-20% Grade IDD, LV moderately dilated.  RV normal. Mild pulmonc stenosis mean peak gradient 14.  Echo 7/16: EF 25-30%, mild LV dilation, mild LVH, trivial pulmonic stenosis with peak gradient 11 mmHg, mildly decreased RV function with mild RV dilation.   Cardiac cath in April 2009 showed His LVEF was 35-40%. Coronary arteries were normal  CPX (3/16): RER 1.03, peak VO2 19.9, VE/VCO2 slope 18, mild to moderately impaired functional capacity.  There was moderate to severe mixed restrictive/obstructive pattern on spirometry => exertional hypoxemia, oxygen saturation decreased to 84%.   Recent Labs: 06/19/2021: TSH 0.984 09/09/2021: Magnesium 2.4 09/17/2021: B Natriuretic Peptide 8.6 10/09/2021: ALT 6; Hemoglobin 12.4; Platelets 158 01/14/2022: BUN 15; Creatinine, Ser 1.17; Potassium 3.4; Sodium 138  04/17/2021: Chol/HDL Ratio 3.0; Cholesterol, Total 128; HDL 43; LDL Chol Calc (NIH) 74; Triglycerides 45   CrCl cannot be calculated (Patient's most recent lab result is older than the maximum 21 days allowed.).   Wt Readings from Last 3 Encounters:  01/14/22 236 lb 6.4 oz (107.2 kg)  01/01/22 233 lb (105.7 kg)  12/05/21 240 lb (108.9 kg)     Other studies reviewed: Additional studies/records reviewed today include: summarized above  ASSESSMENT AND PLAN:  1. NICM  HFmrEF EF improved to 48% on cMRI S/p ICD extraction 08/2021 Given improvement in EF, does not meed criteria for ICD  re-implantation at this time Appears euvolemic on exam today GDMT: spiro 25 daily, entresto 49-51 BID, coreg 6.25 BID Diuretic: 49m torsemide daily Has appt recall in place to see HF group in 04/2022    2. HTN Well controlled on current meds   Disposition: PRN with EP  Current medicines are reviewed at length with the patient today.  The patient did not have any concerns regarding medicines.  SJanyth Contes NP 03/10/2022 2:07 PM     [place] [phone](office)  [phone](fax)

## 2022-03-10 ENCOUNTER — Ambulatory Visit: Payer: Medicare Other | Attending: Physician Assistant | Admitting: Cardiology

## 2022-03-10 ENCOUNTER — Encounter: Payer: Self-pay | Admitting: Physician Assistant

## 2022-03-10 VITALS — BP 100/68 | HR 90 | Ht 66.0 in | Wt 233.2 lb

## 2022-03-10 DIAGNOSIS — I10 Essential (primary) hypertension: Secondary | ICD-10-CM | POA: Diagnosis not present

## 2022-03-10 DIAGNOSIS — I428 Other cardiomyopathies: Secondary | ICD-10-CM | POA: Diagnosis not present

## 2022-03-10 DIAGNOSIS — I5022 Chronic systolic (congestive) heart failure: Secondary | ICD-10-CM

## 2022-03-10 NOTE — Patient Instructions (Signed)
Medication Instructions:   Your physician recommends that you continue on your current medications as directed. Please refer to the Current Medication list given to you today.  *If you need a refill on your cardiac medications before your next appointment, please call your pharmacy*   Lab Work: [place]   If you have labs (blood work) drawn today and your tests are completely normal, you will receive your results only by: [place] (if you have MyChart) OR A paper copy in the mail If you have any lab test that is abnormal or we need to change your treatment, we will call you to review the results.   Testing/Procedures: NONE ORDERED  TODAY     Follow-Up: At [facility], you and your health needs are our priority.  As part of our continuing mission to provide you with exceptional heart care, we have created designated Provider Care Teams.  These Care Teams include your primary Cardiologist (physician) and Advanced Practice Providers (APPs -  Physician Assistants and Nurse Practitioners) who all work together to provide you with the care you need, when you need it.  We recommend signing up for the patient portal called "MyChart".  Sign up information is provided on this After Visit Summary.  MyChart is used to connect with patients for Virtual Visits (Telemedicine).  Patients are able to view lab/test results, encounter notes, upcoming appointments, etc.  Non-urgent messages can be sent to your provider as well.   To learn more about what you can do with MyChart, go to [email or web address].    Your next appointment:   CONTACT [facility] [phone] AS NEEDED FOR  ANY CARDIAC RELATED SYMPTOMS  The format for your next appointment:   In Person  Provider:   You may see Vickie Epley, MD or one of the following Advanced Practice Providers on your designated Care Team:   Tommye Standard, [place] Legrand Como "Jonni Sanger" Chalmers Cater, [place]  Other  Instructions   Important Information About Sugar

## 2022-04-02 IMAGING — DX DG CHEST 1V PORT
1 series · 1 of 1 positions shown · non-contrast
Comparison: 06/19/2021

CLINICAL DATA: Short of breath, respiratory failure

EXAM:
PORTABLE CHEST 1 VIEW

[chest]
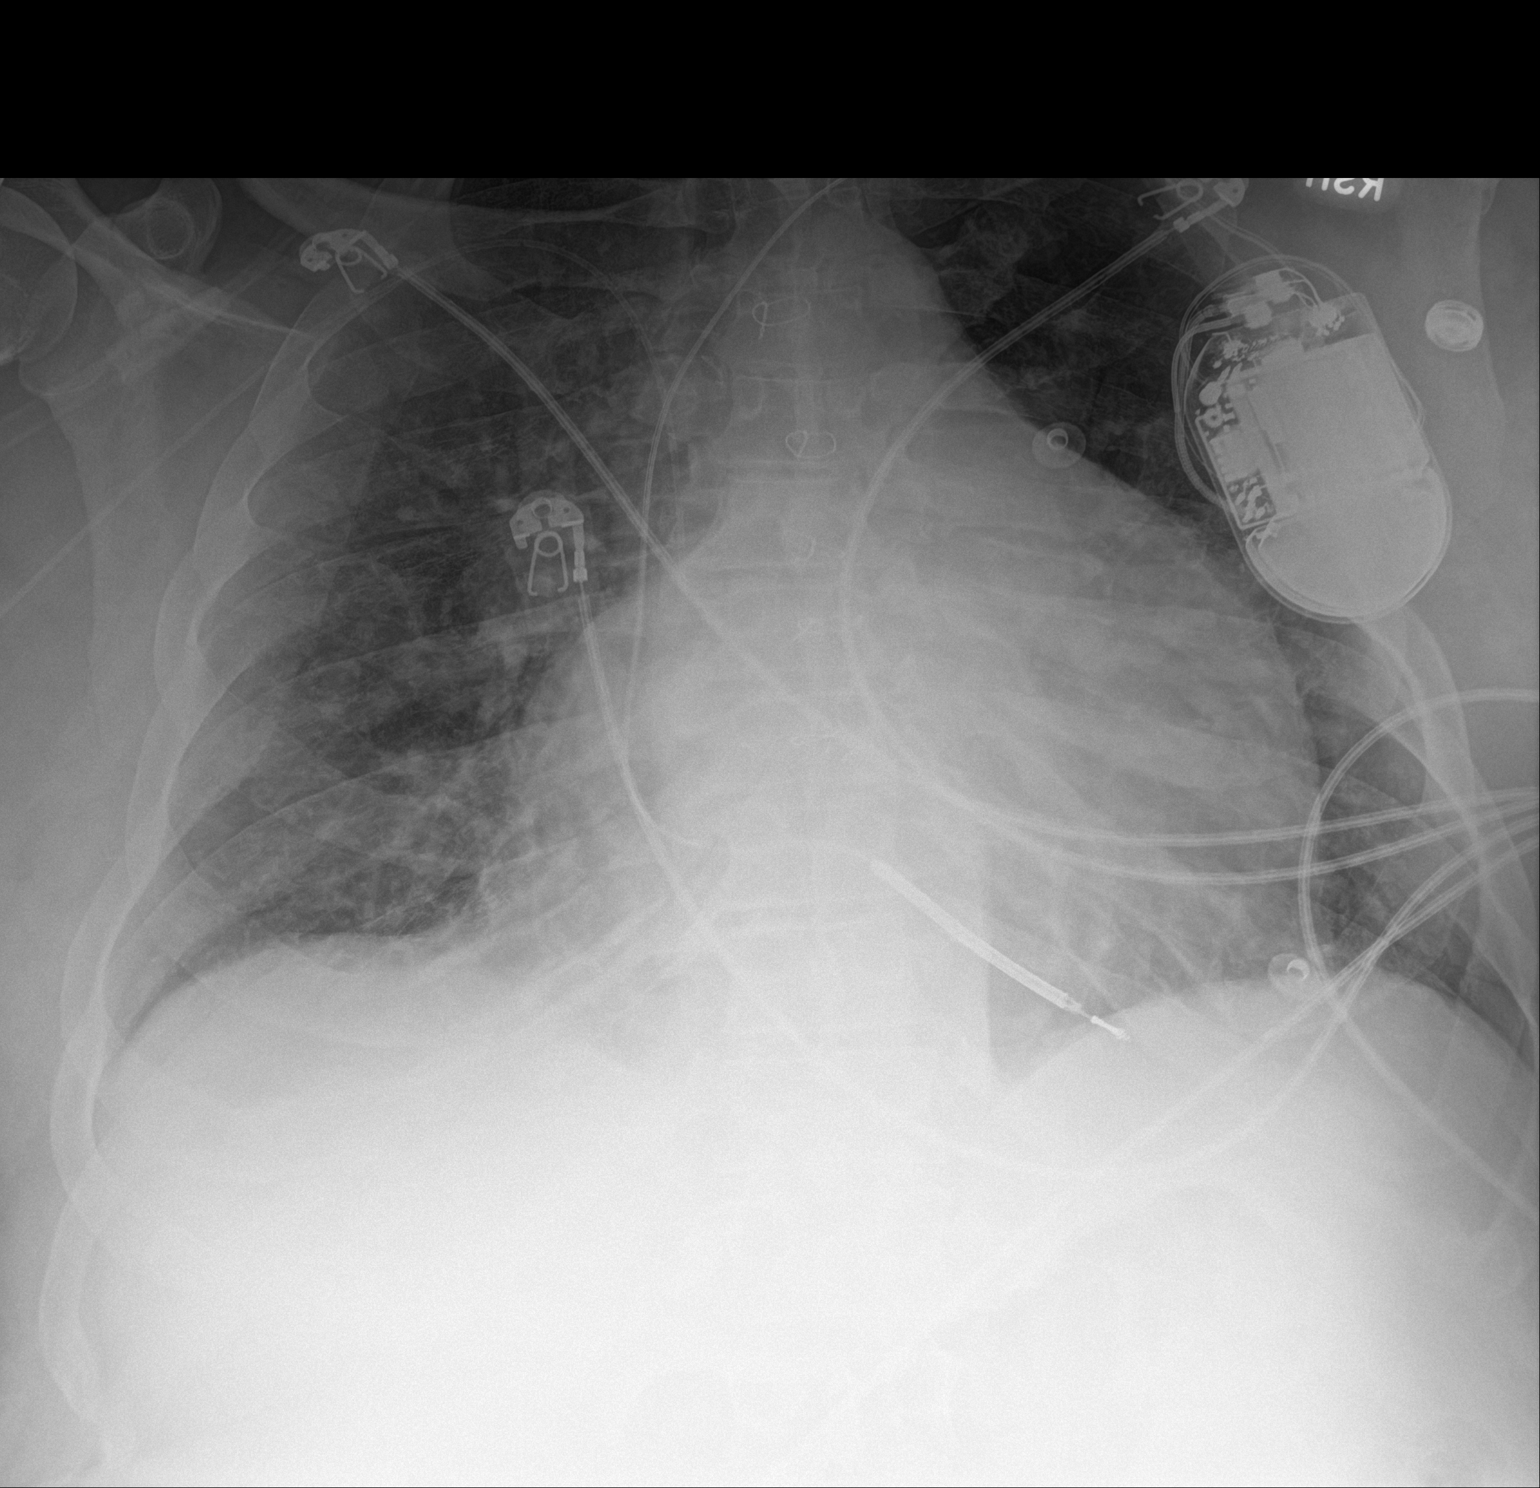

[1 of 1 positions shown; findings below may reference images not displayed]

FINDINGS: Cardiac enlargement.  AICD unchanged in position.

Vascular congestion and mild edema has improved in the interval. No
effusion.

Right arm PICC tip in the SVC
IMPRESSION: Right arm PICC tip mid SVC

Improvement in congestive heart failure and mild edema.

## 2022-04-11 ENCOUNTER — Other Ambulatory Visit (HOSPITAL_COMMUNITY): Payer: Self-pay

## 2022-04-21 ENCOUNTER — Ambulatory Visit (INDEPENDENT_AMBULATORY_CARE_PROVIDER_SITE_OTHER): Payer: Medicare Other | Admitting: Family Medicine

## 2022-04-21 ENCOUNTER — Encounter: Payer: Self-pay | Admitting: Family Medicine

## 2022-04-21 VITALS — BP 102/70 | HR 100 | Temp 98.1°F | Resp 16 | Wt 240.8 lb

## 2022-04-21 DIAGNOSIS — J4520 Mild intermittent asthma, uncomplicated: Secondary | ICD-10-CM | POA: Diagnosis not present

## 2022-04-21 DIAGNOSIS — I5020 Unspecified systolic (congestive) heart failure: Secondary | ICD-10-CM | POA: Diagnosis not present

## 2022-04-21 DIAGNOSIS — K089 Disorder of teeth and supporting structures, unspecified: Secondary | ICD-10-CM

## 2022-04-21 DIAGNOSIS — Z23 Encounter for immunization: Secondary | ICD-10-CM

## 2022-04-21 DIAGNOSIS — E66812 Obesity, class 2: Secondary | ICD-10-CM

## 2022-04-21 DIAGNOSIS — Z6838 Body mass index (BMI) 38.0-38.9, adult: Secondary | ICD-10-CM

## 2022-04-21 NOTE — Progress Notes (Unsigned)
Patient is here for their 6 month follow-up Patient has no concerns today Care gaps have been discussed with patient

## 2022-04-22 ENCOUNTER — Encounter: Payer: Self-pay | Admitting: Family Medicine

## 2022-04-22 NOTE — Progress Notes (Signed)
Established Patient Office Visit  Subjective    Patient ID: Kevin Cardenas, male    DOB: 1975-04-15  Age: 47 y.o. MRN: [id number]  CC:  Chief Complaint  Patient presents with   Follow-up    6 month    HPI HARVEL MESKILL presents for routine follow up of chronic med issues. Patient denies acute complaints or concerns.    Outpatient Encounter Medications as of 04/21/2022  Medication Sig   ascorbic acid (VITAMIN C) 500 MG tablet Take 500 mg by mouth daily.   carvedilol (COREG) 6.25 MG tablet Take 1 tablet (6.25 mg total) by mouth 2 (two) times daily with a meal.   dapagliflozin propanediol (FARXIGA) 10 MG TABS tablet Take 1 tablet (10 mg total) by mouth daily.   fluticasone furoate-vilanterol (BREO ELLIPTA) 100-25 MCG/ACT AEPB Inhale 1 puff into the lungs daily.   potassium chloride SA (KLOR-CON M) 20 MEQ tablet Take 2 tablets (40 mEq total) by mouth daily.   sacubitril-valsartan (ENTRESTO) 49-51 MG Take 1 tablet by mouth 2 (two) times daily.   simvastatin (ZOCOR) 20 MG tablet Take 1 tablet (20 mg total) by mouth at bedtime.   spironolactone (ALDACTONE) 25 MG tablet Take 1 tablet (25 mg total) by mouth daily.   torsemide (DEMADEX) 20 MG tablet Take 2 tablets (40 mg total) by mouth daily.   No facility-administered encounter medications on file as of 04/21/2022.    Past Medical History:  Diagnosis Date   AICD (automatic cardioverter/defibrillator) present    Chronic systolic CHF (congestive heart failure) (H[facility])    EF 25% by echo 06/2018   Diabetes mellitus    Hypertension    Non-ischemic cardiomyopathy ([place])    Obstructive sleep apnea    VSD (ventricular septal defect) 1978   repaired at age 62    Past Surgical History:  Procedure Laterality Date   EP IMPLANTABLE DEVICE N/A 01/19/2015   Procedure: ICD Implant;  Surgeon: Deboraha Sprang, MD;  Location: [place] [facility];  Service: Cardiovascular;  Laterality: N/A;   ICD LEAD REMOVAL  09/05/2021   Procedure: ICD LASER LEAD  REMOVAL;  Surgeon: Vickie Epley, MD;  Location: [place];  Service: Cardiovascular;;   IMPLANTABLE CARDIOVERTER DEFIBRILLATOR (ICD) GENERATOR CHANGE N/A 09/05/2021   Procedure: DEFIBRILLATOR GENERATOR REMOVAL;  Surgeon: Vickie Epley, MD;  Location: [place];  Service: Cardiovascular;  Laterality: N/A;   RIGHT HEART CATHETERIZATION N/A 08/03/2014   Procedure: RIGHT HEART CATH;  Surgeon: Larey Dresser, MD;  [facility];  Service: Cardiovascular;  Laterality: N/A;   RIGHT/LEFT HEART CATH AND CORONARY ANGIOGRAPHY N/A 06/24/2021   Procedure: RIGHT/LEFT HEART CATH AND CORONARY ANGIOGRAPHY;  Surgeon: Larey Dresser, MD;  Location: [place] [facility];  Service: Cardiovascular;  Laterality: N/A;   TEE WITHOUT CARDIOVERSION N/A 09/04/2021   Procedure: TRANSESOPHAGEAL ECHOCARDIOGRAM (TEE);  Surgeon: Larey Dresser, MD;  [facility]: [facility];  Service: Cardiovascular;  Laterality: N/A;   TEE WITHOUT CARDIOVERSION N/A 09/05/2021   Procedure: TRANSESOPHAGEAL ECHOCARDIOGRAM (TEE);  Surgeon: Vickie Epley, MD;  [facility];  Service: Cardiovascular;  Laterality: N/A;   VSD REPAIR      Family History  Problem Relation Age of Onset   Lung cancer Father    Stroke Father    Stroke Mother    Diabetes Maternal Grandfather    Diabetes Paternal Grandmother    Cancer Neg Hx    Alcohol abuse Neg Hx    Drug abuse Neg Hx  Early death Neg Hx    Heart disease Neg Hx    Hyperlipidemia Neg Hx    Hypertension Neg Hx    Kidney disease Neg Hx     Social History   Socioeconomic History   Marital status: Single    Spouse name: Recently Married    Number of children: 0   Years of education: Not on file   Highest education level: Not on file  Occupational History   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   Tobacco Use   Smoking status: Former    Packs/day: 0.30    Years: 15.00    Total pack years: 4.50    Types: Cigarettes     Quit date: 04/08/2011    Years since quitting: 11.0   Smokeless tobacco: Never   Tobacco comments:    Smokes depends on who is around him.    Substance and Sexual Activity   Alcohol use: No    Alcohol/week: 0.0 standard drinks of alcohol    Comment: Rarely    Drug use: No   Sexual activity: Not Currently  Other Topics Concern   Not on file  Social History Narrative   Caffienated drinks-no   Seat belt use often-no   Regular Exercise-   Smoke alarm in the home-yes   Firearms/guns in the home-no   History of physical abuse-no               Social Determinants of Health   Financial Resource Strain: Not on file  Food Insecurity: No Food Insecurity (06/24/2021)   Hunger Vital Sign    Worried About Running Out of Food in the Last Year: Never true    Ran Out of Food in the Last Year: Never true  Transportation Needs: No Transportation Needs (06/24/2021)   PRAPARE - Hydrologist (Medical): No    Lack of Transportation (Non-Medical): No  Physical Activity: Not on file  Stress: Not on file  Social Connections: Not on file  Intimate Partner Violence: Not on file    ROS      Objective    BP 102/70   Pulse 100   Temp 98.1 F (36.7 C) (Oral)   Resp 16   Wt 240 lb 12.8 oz (109.2 kg)   SpO2 94%   BMI 38.87 kg/m   Physical Exam      Assessment & Plan:   1. HFrEF (heart failure with reduced ejection fraction) ([place]) Management as per consultant.  2. Asthma, stable, mild intermittent Appears stable  3. Poor dentition Encouraged visit with dental professional asap  4. Class 2 severe obesity due to excess calories with serious comorbidity and body mass index (BMI) of 38.0 to 38.9 in adult ([place])   5. Need for immunization against influenza  - Flu Vaccine QUAD 60moIM (Fluarix, Fluzone & Alfiuria Quad PF)    Return in about 6 months (around 10/20/2022) for follow up, chronic med issues.   WBecky Sax MD

## 2022-05-16 ENCOUNTER — Other Ambulatory Visit (HOSPITAL_COMMUNITY): Payer: Self-pay

## 2022-06-16 ENCOUNTER — Other Ambulatory Visit (HOSPITAL_COMMUNITY): Payer: Self-pay

## 2022-06-16 ENCOUNTER — Other Ambulatory Visit (HOSPITAL_COMMUNITY): Payer: Self-pay | Admitting: Adult Health

## 2022-06-16 ENCOUNTER — Other Ambulatory Visit: Payer: Self-pay

## 2022-06-16 MED ORDER — TORSEMIDE 20 MG PO TABS
40.0000 mg | ORAL_TABLET | Freq: Every day | ORAL | 3 refills | Status: DC
  Filled 2022-06-16: qty 60, 30d supply, fill #0
  Filled 2022-07-18: qty 60, 30d supply, fill #1
  Filled 2022-08-18: qty 60, 30d supply, fill #2
  Filled 2022-09-18 (×3): qty 60, 30d supply, fill #3

## 2022-06-17 ENCOUNTER — Other Ambulatory Visit (HOSPITAL_COMMUNITY): Payer: Self-pay

## 2022-07-04 ENCOUNTER — Other Ambulatory Visit (HOSPITAL_COMMUNITY): Payer: Self-pay

## 2022-07-04 DIAGNOSIS — J45909 Unspecified asthma, uncomplicated: Secondary | ICD-10-CM | POA: Diagnosis not present

## 2022-07-04 DIAGNOSIS — Z79899 Other long term (current) drug therapy: Secondary | ICD-10-CM | POA: Diagnosis not present

## 2022-07-04 DIAGNOSIS — Q210 Ventricular septal defect: Secondary | ICD-10-CM | POA: Diagnosis not present

## 2022-07-04 DIAGNOSIS — Z0000 Encounter for general adult medical examination without abnormal findings: Secondary | ICD-10-CM | POA: Diagnosis not present

## 2022-07-04 DIAGNOSIS — E785 Hyperlipidemia, unspecified: Secondary | ICD-10-CM | POA: Diagnosis not present

## 2022-07-04 DIAGNOSIS — I509 Heart failure, unspecified: Secondary | ICD-10-CM | POA: Diagnosis not present

## 2022-07-04 DIAGNOSIS — J449 Chronic obstructive pulmonary disease, unspecified: Secondary | ICD-10-CM | POA: Diagnosis not present

## 2022-07-04 DIAGNOSIS — E119 Type 2 diabetes mellitus without complications: Secondary | ICD-10-CM | POA: Diagnosis not present

## 2022-07-04 DIAGNOSIS — I10 Essential (primary) hypertension: Secondary | ICD-10-CM | POA: Diagnosis not present

## 2022-07-04 MED ORDER — ALBUTEROL SULFATE HFA 108 (90 BASE) MCG/ACT IN AERS
INHALATION_SPRAY | RESPIRATORY_TRACT | 3 refills | Status: DC
Start: 2022-07-04 — End: 2023-01-26
  Filled 2022-07-04: qty 6.7, 25d supply, fill #0
  Filled 2022-08-18: qty 6.7, 25d supply, fill #1
  Filled 2022-10-15: qty 6.7, 25d supply, fill #2
  Filled 2022-12-22: qty 6.7, 25d supply, fill #3

## 2022-07-07 ENCOUNTER — Other Ambulatory Visit (HOSPITAL_COMMUNITY): Payer: Self-pay

## 2022-07-18 ENCOUNTER — Other Ambulatory Visit (HOSPITAL_COMMUNITY): Payer: Self-pay

## 2022-08-12 ENCOUNTER — Telehealth: Payer: Self-pay | Admitting: Family Medicine

## 2022-08-12 NOTE — Telephone Encounter (Signed)
Called patient to schedule Medicare Annual Wellness Visit (AWV). Left message for patient to call back and schedule Medicare Annual Wellness Visit (AWV).  Last date of AWV: 04/17/21   If any questions, please contact me at [phone].  Thank you ,  Rudell Cobb AWV direct phone # [phone]

## 2022-08-15 DIAGNOSIS — R7303 Prediabetes: Secondary | ICD-10-CM | POA: Diagnosis not present

## 2022-08-15 DIAGNOSIS — Z79899 Other long term (current) drug therapy: Secondary | ICD-10-CM | POA: Diagnosis not present

## 2022-08-15 DIAGNOSIS — I10 Essential (primary) hypertension: Secondary | ICD-10-CM | POA: Diagnosis not present

## 2022-08-15 DIAGNOSIS — I110 Hypertensive heart disease with heart failure: Secondary | ICD-10-CM | POA: Diagnosis not present

## 2022-08-15 DIAGNOSIS — I5042 Chronic combined systolic (congestive) and diastolic (congestive) heart failure: Secondary | ICD-10-CM | POA: Diagnosis not present

## 2022-08-15 DIAGNOSIS — I509 Heart failure, unspecified: Secondary | ICD-10-CM | POA: Diagnosis not present

## 2022-08-15 DIAGNOSIS — G4733 Obstructive sleep apnea (adult) (pediatric): Secondary | ICD-10-CM | POA: Diagnosis not present

## 2022-08-15 DIAGNOSIS — Z0000 Encounter for general adult medical examination without abnormal findings: Secondary | ICD-10-CM | POA: Diagnosis not present

## 2022-08-15 DIAGNOSIS — E559 Vitamin D deficiency, unspecified: Secondary | ICD-10-CM | POA: Diagnosis not present

## 2022-08-15 DIAGNOSIS — J45909 Unspecified asthma, uncomplicated: Secondary | ICD-10-CM | POA: Diagnosis not present

## 2022-08-15 DIAGNOSIS — E785 Hyperlipidemia, unspecified: Secondary | ICD-10-CM | POA: Diagnosis not present

## 2022-08-17 ENCOUNTER — Other Ambulatory Visit (HOSPITAL_COMMUNITY): Payer: Self-pay

## 2022-08-18 ENCOUNTER — Other Ambulatory Visit (HOSPITAL_COMMUNITY): Payer: Self-pay

## 2022-08-18 MED ORDER — VITAMIN D3 25 MCG (1000 UNIT) PO TABS: 25.0000 ug | ORAL_TABLET | Freq: Every day | ORAL | 3 refills | Status: AC

## 2022-08-19 ENCOUNTER — Other Ambulatory Visit (HOSPITAL_COMMUNITY): Payer: Self-pay

## 2022-08-28 ENCOUNTER — Telehealth: Payer: Self-pay | Admitting: Family Medicine

## 2022-08-28 NOTE — Telephone Encounter (Signed)
Called patient to schedule Medicare Annual Wellness Visit (AWV). Left message for patient to call back and schedule Medicare Annual Wellness Visit (AWV).  Last date of AWV: 04/17/21  Please transfer to Lennix Rotundo @ [phone]  Thank you ,  Rudell Cobb AWV direct phone # [phone]

## 2022-09-18 ENCOUNTER — Other Ambulatory Visit (HOSPITAL_COMMUNITY): Payer: Self-pay

## 2022-09-18 ENCOUNTER — Other Ambulatory Visit (HOSPITAL_BASED_OUTPATIENT_CLINIC_OR_DEPARTMENT_OTHER): Payer: Self-pay

## 2022-10-14 ENCOUNTER — Other Ambulatory Visit (HOSPITAL_COMMUNITY): Payer: Self-pay

## 2022-10-14 MED ORDER — METRONIDAZOLE 500 MG PO TABS
500.0000 mg | ORAL_TABLET | Freq: Three times a day (TID) | ORAL | 0 refills | Status: DC
  Filled 2022-10-14: qty 21, 7d supply, fill #0

## 2022-10-15 ENCOUNTER — Other Ambulatory Visit (HOSPITAL_COMMUNITY): Payer: Self-pay | Admitting: Internal Medicine

## 2022-10-15 ENCOUNTER — Other Ambulatory Visit (HOSPITAL_COMMUNITY): Payer: Self-pay

## 2022-10-15 MED ORDER — TORSEMIDE 20 MG PO TABS
40.0000 mg | ORAL_TABLET | Freq: Every day | ORAL | 0 refills | Status: DC
  Filled 2022-10-15: qty 60, 30d supply, fill #0

## 2022-10-30 NOTE — Research (Signed)
Per review of chart - Shanda Bumps noted yes to new ae's per chart review no new ae's were found.  Error on documentation in chart.   Mercer Pod :) RN BSN  Clinical Research Nurse  Be strong and take heart, all you who hope in the [place]. ~ Psalm 31:24

## 2022-11-03 ENCOUNTER — Ambulatory Visit: Payer: 59 | Admitting: Family Medicine

## 2022-11-06 ENCOUNTER — Other Ambulatory Visit (HOSPITAL_COMMUNITY): Payer: Self-pay

## 2022-11-07 ENCOUNTER — Other Ambulatory Visit (HOSPITAL_COMMUNITY): Payer: Self-pay

## 2022-11-07 MED ORDER — DAPAGLIFLOZIN PROPANEDIOL 10 MG PO TABS
10.0000 mg | ORAL_TABLET | Freq: Every morning | ORAL | 3 refills | Status: DC
  Filled 2022-11-07: qty 90, 90d supply, fill #0

## 2022-11-10 ENCOUNTER — Other Ambulatory Visit (HOSPITAL_COMMUNITY): Payer: Self-pay | Admitting: Internal Medicine

## 2022-11-10 ENCOUNTER — Other Ambulatory Visit (HOSPITAL_COMMUNITY): Payer: Self-pay

## 2022-11-10 ENCOUNTER — Other Ambulatory Visit (HOSPITAL_COMMUNITY): Payer: Self-pay | Admitting: Cardiology

## 2022-11-10 MED ORDER — SIMVASTATIN 20 MG PO TABS
20.0000 mg | ORAL_TABLET | Freq: Every evening | ORAL | 3 refills | Status: DC
  Filled 2022-11-10: qty 90, 90d supply, fill #0

## 2022-11-10 MED ORDER — TORSEMIDE 20 MG PO TABS
40.0000 mg | ORAL_TABLET | Freq: Every day | ORAL | 0 refills | Status: DC
  Filled 2022-11-10: qty 60, 30d supply, fill #0

## 2022-11-10 MED ORDER — POTASSIUM CHLORIDE CRYS ER 20 MEQ PO TBCR
40.0000 meq | EXTENDED_RELEASE_TABLET | Freq: Every day | ORAL | 0 refills | Status: DC
  Filled 2022-11-10: qty 60, 30d supply, fill #0

## 2022-11-18 ENCOUNTER — Other Ambulatory Visit (HOSPITAL_BASED_OUTPATIENT_CLINIC_OR_DEPARTMENT_OTHER): Payer: Self-pay

## 2022-12-04 ENCOUNTER — Ambulatory Visit: Payer: 59

## 2022-12-04 DIAGNOSIS — Z0000 Encounter for general adult medical examination without abnormal findings: Secondary | ICD-10-CM | POA: Diagnosis not present

## 2022-12-04 DIAGNOSIS — Z1211 Encounter for screening for malignant neoplasm of colon: Secondary | ICD-10-CM

## 2022-12-04 NOTE — Addendum Note (Signed)
Addended by: Barb Merino on: 12/04/2022 04:43 PM   Modules accepted: Orders

## 2022-12-04 NOTE — Patient Instructions (Signed)
Mr. Bookwalter , Thank you for taking time to come for your Medicare Wellness Visit. I appreciate your ongoing commitment to your health goals. Please review the following plan we discussed and let me know if I can assist you in the future.   Referrals/Orders/Follow-Ups/Clinician Recommendations: colonoscopy  This is a list of the screening recommended for you and due dates:  Health Maintenance  Topic Date Due   Yearly kidney health urinalysis for diabetes  01/14/2017   Complete foot exam   01/14/2017   Colon Cancer Screening  Never done   Eye exam for diabetics  12/08/2020   Hemoglobin A1C  12/21/2021   Flu Shot  11/13/2022   Yearly kidney function blood test for diabetes  01/15/2023   Medicare Annual Wellness Visit  12/04/2023   Hepatitis C Screening  Completed   HIV Screening  Completed   HPV Vaccine  Aged Out   DTaP/Tdap/Td vaccine  Discontinued   COVID-19 Vaccine  Discontinued    Advanced directives: (ACP Link)Information on Advanced Care Planning can be found at [facility] Advance Health Care Directives Advance Health Care Directives ([email or web address]   Next Medicare Annual Wellness Visit scheduled for next year: No, schedule is not open for next year Insert preventive care Attachment reference

## 2022-12-04 NOTE — Progress Notes (Signed)
Subjective:   Kevin Cardenas is a 47 y.o. male who presents for Medicare Annual/Subsequent preventive examination.  Visit Complete: Virtual  I connected with  Peter Garter on 12/04/22 by a audio enabled telemedicine application and verified that I am speaking with the correct person using two identifiers.  Patient Location: Home  Provider Location: Office/Clinic  I discussed the limitations of evaluation and management by telemedicine. The patient expressed understanding and agreed to proceed.  Vital Signs: Unable to obtain new vitals due to this being a telehealth visit.  Review of Systems     Cardiac Risk Factors include: male gender;dyslipidemia;hypertension     Objective:    Today's Vitals   12/04/22 1620  PainSc: 5    There is no height or weight on file to calculate BMI.     12/04/2022    4:31 PM 10/09/2021    9:56 AM 09/30/2021    3:30 PM 09/05/2021    2:22 PM 09/04/2021    7:03 AM 09/01/2021    1:14 PM 06/19/2021    9:48 AM  Advanced Directives  Does Patient Have a Medical Advance Directive? No No No No No No No  Would patient like information on creating a medical advance directive?  No - Patient declined No - Patient declined No - Patient declined No - Patient declined No - Patient declined No - Patient declined    Current Medications (verified) Outpatient Encounter Medications as of 12/04/2022  Medication Sig   albuterol (VENTOLIN HFA) 108 (90 Base) MCG/ACT inhaler Inhale 1-2 puffs into the lungs every 1 hour for up to 3 doses, THEN 1-2 puffs every 2-4 hours as needed for shortness of breath. Contact PCP if shortness of breath persists  beyond 6 puffs.   ascorbic acid (VITAMIN C) 500 MG tablet Take 500 mg by mouth daily.   carvedilol (COREG) 6.25 MG tablet Take 1 tablet (6.25 mg total) by mouth 2 (two) times daily with a meal.   cholecalciferol (VITAMIN D3) 25 MCG (1000 UNIT) tablet Take 1 tablet (25 mcg total) by mouth daily for Vitamin D deficiency    dapagliflozin propanediol (FARXIGA) 10 MG TABS tablet Take 1 tablet (10 mg total) by mouth in the morning.   fluticasone furoate-vilanterol (BREO ELLIPTA) 100-25 MCG/ACT AEPB Inhale 1 puff into the lungs daily.   potassium chloride SA (KLOR-CON M) 20 MEQ tablet Take 2 tablets (40 mEq total) by mouth daily.   sacubitril-valsartan (ENTRESTO) 49-51 MG Take 1 tablet by mouth 2 (two) times daily.   simvastatin (ZOCOR) 20 MG tablet Take 1 tablet (20 mg total) by mouth every evening for hyperlipidemia   spironolactone (ALDACTONE) 25 MG tablet Take 1 tablet (25 mg total) by mouth daily.   torsemide (DEMADEX) 20 MG tablet Take 2 tablets (40 mg total) by mouth daily. NEEDS FOLLOW UP APPOINTMENT FOR MORE REFILLS   dapagliflozin propanediol (FARXIGA) 10 MG TABS tablet Take 1 tablet (10 mg total) by mouth daily.   metroNIDAZOLE (FLAGYL) 500 MG tablet Take 1 tablet (500 mg total) by mouth every 8 (eight) hours for dental infection for 7 days (Patient not taking: Reported on 12/04/2022)   simvastatin (ZOCOR) 20 MG tablet Take 1 tablet (20 mg total) by mouth at bedtime.   No facility-administered encounter medications on file as of 12/04/2022.    Allergies (verified) Metolazone   History: Past Medical History:  Diagnosis Date   AICD (automatic cardioverter/defibrillator) present    Chronic systolic CHF (congestive heart failure) (HCC)  EF 25% by echo 06/2018   Diabetes mellitus    Hypertension    Non-ischemic cardiomyopathy (HCC)    Obstructive sleep apnea    VSD (ventricular septal defect) 1978   repaired at age 55   Past Surgical History:  Procedure Laterality Date   EP IMPLANTABLE DEVICE N/A 01/19/2015   Procedure: ICD Implant;  Surgeon: Duke Salvia, MD;  Location: [facility] INVASIVE CV LAB;  Service: Cardiovascular;  Laterality: N/A;   ICD LEAD REMOVAL  09/05/2021   Procedure: ICD LASER LEAD REMOVAL;  Surgeon: Lanier Prude, MD;  Location: [facility] OR;  Service: Cardiovascular;;   IMPLANTABLE  CARDIOVERTER DEFIBRILLATOR (ICD) GENERATOR CHANGE N/A 09/05/2021   Procedure: DEFIBRILLATOR GENERATOR REMOVAL;  Surgeon: Lanier Prude, MD;  Location: [facility] OR;  Service: Cardiovascular;  Laterality: N/A;   RIGHT HEART CATHETERIZATION N/A 08/03/2014   Procedure: RIGHT HEART CATH;  Surgeon: Laurey Morale, MD;  Location: [facility] CATH LAB;  Service: Cardiovascular;  Laterality: N/A;   RIGHT/LEFT HEART CATH AND CORONARY ANGIOGRAPHY N/A 06/24/2021   Procedure: RIGHT/LEFT HEART CATH AND CORONARY ANGIOGRAPHY;  Surgeon: Laurey Morale, MD;  Location: [facility] INVASIVE CV LAB;  Service: Cardiovascular;  Laterality: N/A;   TEE WITHOUT CARDIOVERSION N/A 09/04/2021   Procedure: TRANSESOPHAGEAL ECHOCARDIOGRAM (TEE);  Surgeon: Laurey Morale, MD;  Location: [facility] ENDOSCOPY;  Service: Cardiovascular;  Laterality: N/A;   TEE WITHOUT CARDIOVERSION N/A 09/05/2021   Procedure: TRANSESOPHAGEAL ECHOCARDIOGRAM (TEE);  Surgeon: Lanier Prude, MD;  Location: [facility] OR;  Service: Cardiovascular;  Laterality: N/A;   VSD REPAIR     Family History  Problem Relation Age of Onset   Lung cancer Father    Stroke Father    Stroke Mother    Diabetes Maternal Grandfather    Diabetes Paternal Grandmother    Cancer Neg Hx    Alcohol abuse Neg Hx    Drug abuse Neg Hx    Early death Neg Hx    Heart disease Neg Hx    Hyperlipidemia Neg Hx    Hypertension Neg Hx    Kidney disease Neg Hx    Social History   Socioeconomic History   Marital status: Single    Spouse name: Recently Married    Number of children: 0   Years of education: Not on file   Highest education level: Not on file  Occupational History   Occupation: Warden/ranger     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   Tobacco Use   Smoking status: Former    Current packs/day: 0.00    Average packs/day: 0.3 packs/day for 15.0 years (4.5 ttl pk-yrs)    Types: Cigarettes    Start date: 04/07/1996    Quit date: 04/08/2011    Years since quitting:  11.6   Smokeless tobacco: Never   Tobacco comments:    Smokes depends on who is around him.    Vaping Use   Vaping status: Never Used  Substance and Sexual Activity   Alcohol use: No    Alcohol/week: 0.0 standard drinks of alcohol    Comment: Rarely    Drug use: No   Sexual activity: Not Currently  Other Topics Concern   Not on file  Social History Narrative   Caffienated drinks-no   Seat belt use often-no   Regular Exercise-   Smoke alarm in the home-yes   Firearms/guns in the home-no   History of physical abuse-no  Social Determinants of Health   Financial Resource Strain: Low Risk  (12/04/2022)   Overall Financial Resource Strain (CARDIA)    Difficulty of Paying Living Expenses: Not hard at all  Food Insecurity: No Food Insecurity (12/04/2022)   Hunger Vital Sign    Worried About Running Out of Food in the Last Year: Never true    Ran Out of Food in the Last Year: Never true  Transportation Needs: No Transportation Needs (06/24/2021)   PRAPARE - Administrator, Civil Service (Medical): No    Lack of Transportation (Non-Medical): No  Physical Activity: Sufficiently Active (12/04/2022)   Exercise Vital Sign    Days of Exercise per Week: 4 days    Minutes of Exercise per Session: 60 min  Stress: No Stress Concern Present (12/04/2022)   [employer] of Occupational Health - Occupational Stress Questionnaire    Feeling of Stress : Not at all  Social Connections: Moderately Integrated (12/04/2022)   Social Connection and Isolation Panel [NHANES]    Frequency of Communication with Friends and Family: Three times a week    Frequency of Social Gatherings with Friends and Family: Three times a week    Attends Religious Services: More than 4 times per year    Active Member of Clubs or Organizations: No    Attends Banker Meetings: Never    Marital Status: Married    Tobacco Counseling Counseling given: Not Answered Tobacco  comments: Smokes depends on who is around him.     Clinical Intake:  Pre-visit preparation completed: Yes  Pain : 0-10 Pain Score: 5  Pain Type: Chronic pain Pain Location: Back Pain Orientation: Lower, Right Pain Descriptors / Indicators: Aching Pain Onset: More than a month ago Pain Frequency: Intermittent     Nutritional Risks: None Diabetes: No  How often do you need to have someone help you when you read instructions, pamphlets, or other written materials from your doctor or pharmacy?: 1 - Never  Interpreter Needed?: No  Information entered by :: NAllen LPN   Activities of Daily Living    12/04/2022    4:22 PM  In your present state of health, do you have any difficulty performing the following activities:  Hearing? 0  Vision? 0  Difficulty concentrating or making decisions? 0  Walking or climbing stairs? 1  Comment due to achy legs  Dressing or bathing? 0  Doing errands, shopping? 0  Preparing Food and eating ? N  Using the Toilet? N  In the past six months, have you accidently leaked urine? N  Do you have problems with loss of bowel control? N  Managing your Medications? N  Managing your Finances? N  Housekeeping or managing your Housekeeping? N    Patient Care Team: Georganna Skeans, MD as PCP - General (Family Medicine) Laurey Morale, MD as PCP - Advanced Heart Failure (Cardiology) Laurey Morale, MD as PCP - Cardiology (Cardiology) Lanier Prude, MD as PCP - Electrophysiology (Cardiology)  Indicate any recent Medical Services you may have received from other than [facility] providers in the past year (date may be approximate).     Assessment:   This is a routine wellness examination for Kevin Cardenas.  Hearing/Vision screen Hearing Screening - Comments:: Denies hearing issues Vision Screening - Comments:: No regular eye exams  Dietary issues and exercise activities discussed:     Goals Addressed             This Visit's Progress  Patient Stated       12/04/2022, wants to lose weight       Depression Screen    12/04/2022    4:32 PM 10/22/2021    9:57 AM 09/20/2021    9:08 AM 04/17/2021   10:09 AM  PHQ 2/9 Scores  PHQ - 2 Score 0 0 0 0  PHQ- 9 Score 0 0  0    Fall Risk    12/04/2022    4:32 PM 09/20/2021    9:08 AM  Fall Risk   Falls in the past year? 0 0  Number falls in past yr: 0 0  Injury with Fall? 0 0  Risk for fall due to : Medication side effect   Follow up Falls prevention discussed;Falls evaluation completed     MEDICARE RISK AT HOME: Medicare Risk at Home Any stairs in or around the home?: No If so, are there any without handrails?: No Home free of loose throw rugs in walkways, pet beds, electrical cords, etc?: Yes Adequate lighting in your home to reduce risk of falls?: Yes Life alert?: No Use of a cane, walker or w/c?: No Grab bars in the bathroom?: No Shower chair or bench in shower?: No Elevated toilet seat or a handicapped toilet?: No  TIMED UP AND GO:  Was the test performed?  No    Cognitive Function:        12/04/2022    4:33 PM  6CIT Screen  What Year? 0 points  What month? 0 points  What time? 0 points  Count back from 20 0 points  Months in reverse 0 points  Repeat phrase 0 points  Total Score 0 points    Immunizations Immunization History  Administered Date(s) Administered   Influenza Split 01/30/2011, 04/01/2012   Influenza Whole 01/28/2011   Influenza,inj,Quad PF,6+ Mos 04/17/2014, 01/15/2016, 04/17/2021, 04/21/2022   PFIZER(Purple Top)SARS-COV-2 Vaccination 06/13/2019, 07/06/2019   Pneumococcal Polysaccharide-23 04/01/2012   Tdap 01/30/2011    TDAP status: Up to date  Flu Vaccine status: Due, Education has been provided regarding the importance of this vaccine. Advised may receive this vaccine at local pharmacy or Health Dept. Aware to provide a copy of the vaccination record if obtained from local pharmacy or Health Dept. Verbalized acceptance and  understanding.  Pneumococcal vaccine status: Up to date  Covid-19 vaccine status: Information provided on how to obtain vaccines.   Qualifies for Shingles Vaccine? No   Zostavax completed  n/a   Shingrix Completed?: n/a  Screening Tests Health Maintenance  Topic Date Due   Diabetic kidney evaluation - Urine ACR  01/14/2017   FOOT EXAM  01/14/2017   Colonoscopy  Never done   OPHTHALMOLOGY EXAM  12/08/2020   HEMOGLOBIN A1C  12/21/2021   INFLUENZA VACCINE  11/13/2022   Diabetic kidney evaluation - eGFR measurement  01/15/2023   Medicare Annual Wellness (AWV)  12/04/2023   Hepatitis C Screening  Completed   HIV Screening  Completed   HPV VACCINES  Aged Out   DTaP/Tdap/Td  Discontinued   COVID-19 Vaccine  Discontinued    Health Maintenance  Health Maintenance Due  Topic Date Due   Diabetic kidney evaluation - Urine ACR  01/14/2017   FOOT EXAM  01/14/2017   Colonoscopy  Never done   OPHTHALMOLOGY EXAM  12/08/2020   HEMOGLOBIN A1C  12/21/2021   INFLUENZA VACCINE  11/13/2022    Colorectal cancer screening: Referral to GI placed today. Pt aware the office will call re: appt.  Lung Cancer  Screening: (Low Dose CT Chest recommended if Age 36-80 years, 20 pack-year currently smoking OR have quit w/in 15years.) does not qualify.   Lung Cancer Screening Referral: no  Additional Screening:  Hepatitis C Screening: does qualify; Completed 01/15/2016  Vision Screening: Recommended annual ophthalmology exams for early detection of glaucoma and other disorders of the eye. Is the patient up to date with their annual eye exam?  No  Who is the provider or what is the name of the office in which the patient attends annual eye exams? none If pt is not established with a provider, would they like to be referred to a provider to establish care? No .   Dental Screening: Recommended annual dental exams for proper oral hygiene  Diabetic Foot Exam: n/a  Community Resource Referral / Chronic  Care Management: CRR required this visit?  No   CCM required this visit?  No     Plan:     I have personally reviewed and noted the following in the patient's chart:   Medical and social history Use of alcohol, tobacco or illicit drugs  Current medications and supplements including opioid prescriptions. Patient is not currently taking opioid prescriptions. Functional ability and status Nutritional status Physical activity Advanced directives List of other physicians Hospitalizations, surgeries, and ER visits in previous 12 months Vitals Screenings to include cognitive, depression, and falls Referrals and appointments  In addition, I have reviewed and discussed with patient certain preventive protocols, quality metrics, and best practice recommendations. A written personalized care plan for preventive services as well as general preventive health recommendations were provided to patient.     Barb Merino, LPN   [id number]   After Visit Summary: (MyChart) Due to this being a telephonic visit, the after visit summary with patients personalized plan was offered to patient via MyChart   Nurse Notes: none

## 2022-12-08 ENCOUNTER — Other Ambulatory Visit (HOSPITAL_COMMUNITY): Payer: Self-pay

## 2022-12-08 MED ORDER — DAPAGLIFLOZIN PROPANEDIOL 10 MG PO TABS
10.0000 mg | ORAL_TABLET | Freq: Every morning | ORAL | 3 refills | Status: DC
  Filled 2022-12-08 – 2023-02-10 (×2): qty 90, 90d supply, fill #0

## 2022-12-22 ENCOUNTER — Other Ambulatory Visit (HOSPITAL_COMMUNITY): Payer: Self-pay | Admitting: Cardiology

## 2022-12-22 ENCOUNTER — Other Ambulatory Visit: Payer: Self-pay

## 2022-12-22 ENCOUNTER — Other Ambulatory Visit (HOSPITAL_COMMUNITY): Payer: Self-pay

## 2022-12-22 ENCOUNTER — Other Ambulatory Visit (HOSPITAL_COMMUNITY): Payer: Self-pay | Admitting: Internal Medicine

## 2022-12-22 DIAGNOSIS — I5022 Chronic systolic (congestive) heart failure: Secondary | ICD-10-CM

## 2022-12-23 ENCOUNTER — Other Ambulatory Visit (HOSPITAL_COMMUNITY): Payer: Self-pay

## 2022-12-23 ENCOUNTER — Other Ambulatory Visit: Payer: Self-pay

## 2022-12-23 MED ORDER — TORSEMIDE 20 MG PO TABS
40.0000 mg | ORAL_TABLET | Freq: Every day | ORAL | 0 refills | Status: DC
  Filled 2022-12-23: qty 60, 30d supply, fill #0

## 2022-12-23 MED ORDER — CARVEDILOL 6.25 MG PO TABS
6.2500 mg | ORAL_TABLET | Freq: Two times a day (BID) | ORAL | 1 refills | Status: DC
  Filled 2022-12-23: qty 60, 30d supply, fill #0
  Filled 2023-01-26: qty 60, 30d supply, fill #1

## 2022-12-23 MED ORDER — POTASSIUM CHLORIDE CRYS ER 20 MEQ PO TBCR
40.0000 meq | EXTENDED_RELEASE_TABLET | Freq: Every day | ORAL | 1 refills | Status: DC
  Filled 2022-12-23: qty 60, 30d supply, fill #0
  Filled 2023-01-26: qty 60, 30d supply, fill #1

## 2022-12-29 ENCOUNTER — Other Ambulatory Visit (HOSPITAL_COMMUNITY): Payer: Self-pay

## 2023-01-26 ENCOUNTER — Other Ambulatory Visit (HOSPITAL_COMMUNITY): Payer: Self-pay

## 2023-01-26 ENCOUNTER — Encounter (HOSPITAL_COMMUNITY): Payer: Self-pay

## 2023-01-26 ENCOUNTER — Other Ambulatory Visit: Payer: Self-pay

## 2023-01-26 ENCOUNTER — Other Ambulatory Visit (HOSPITAL_COMMUNITY): Payer: Self-pay | Admitting: Internal Medicine

## 2023-01-26 ENCOUNTER — Other Ambulatory Visit: Payer: Self-pay | Admitting: Family Medicine

## 2023-01-26 MED ORDER — TORSEMIDE 20 MG PO TABS
40.0000 mg | ORAL_TABLET | Freq: Every day | ORAL | 0 refills | Status: DC
  Filled 2023-01-26: qty 60, 30d supply, fill #0

## 2023-01-26 MED ORDER — ALBUTEROL SULFATE HFA 108 (90 BASE) MCG/ACT IN AERS
1.0000 | INHALATION_SPRAY | RESPIRATORY_TRACT | 3 refills | Status: DC
  Filled 2023-01-26: qty 6.7, 25d supply, fill #0
  Filled 2023-04-06: qty 6.7, 25d supply, fill #1
  Filled 2023-06-15: qty 6.7, 25d supply, fill #2
  Filled 2023-08-18: qty 6.7, 25d supply, fill #3

## 2023-01-27 ENCOUNTER — Other Ambulatory Visit (HOSPITAL_COMMUNITY): Payer: Self-pay

## 2023-02-02 ENCOUNTER — Encounter: Payer: 59 | Admitting: Internal Medicine

## 2023-02-10 ENCOUNTER — Other Ambulatory Visit (HOSPITAL_COMMUNITY): Payer: Self-pay

## 2023-02-20 ENCOUNTER — Other Ambulatory Visit (HOSPITAL_COMMUNITY): Payer: Self-pay

## 2023-03-02 ENCOUNTER — Other Ambulatory Visit (HOSPITAL_COMMUNITY): Payer: Self-pay | Admitting: Cardiology

## 2023-03-02 ENCOUNTER — Other Ambulatory Visit (HOSPITAL_COMMUNITY): Payer: Self-pay | Admitting: Internal Medicine

## 2023-03-02 ENCOUNTER — Other Ambulatory Visit (HOSPITAL_COMMUNITY): Payer: Self-pay

## 2023-03-02 DIAGNOSIS — I5022 Chronic systolic (congestive) heart failure: Secondary | ICD-10-CM

## 2023-03-03 ENCOUNTER — Other Ambulatory Visit: Payer: Self-pay

## 2023-03-03 ENCOUNTER — Other Ambulatory Visit (HOSPITAL_COMMUNITY): Payer: Self-pay

## 2023-03-03 MED ORDER — TORSEMIDE 20 MG PO TABS
40.0000 mg | ORAL_TABLET | Freq: Every day | ORAL | 0 refills | Status: DC
  Filled 2023-03-03: qty 60, 30d supply, fill #0

## 2023-03-03 MED ORDER — CARVEDILOL 6.25 MG PO TABS
6.2500 mg | ORAL_TABLET | Freq: Two times a day (BID) | ORAL | 0 refills | Status: DC
  Filled 2023-03-03: qty 180, 90d supply, fill #0

## 2023-03-03 MED ORDER — POTASSIUM CHLORIDE CRYS ER 20 MEQ PO TBCR
40.0000 meq | EXTENDED_RELEASE_TABLET | Freq: Every day | ORAL | 1 refills | Status: DC
  Filled 2023-03-03: qty 60, 30d supply, fill #0
  Filled 2023-04-06: qty 60, 30d supply, fill #1

## 2023-03-30 ENCOUNTER — Other Ambulatory Visit (HOSPITAL_COMMUNITY): Payer: Self-pay

## 2023-03-30 MED ORDER — SIMVASTATIN 20 MG PO TABS
20.0000 mg | ORAL_TABLET | Freq: Every evening | ORAL | 3 refills | Status: DC
  Filled 2023-03-30: qty 90, 90d supply, fill #0

## 2023-04-06 ENCOUNTER — Other Ambulatory Visit (HOSPITAL_COMMUNITY): Payer: Self-pay | Admitting: Internal Medicine

## 2023-04-06 ENCOUNTER — Other Ambulatory Visit (HOSPITAL_COMMUNITY): Payer: Self-pay

## 2023-04-07 ENCOUNTER — Other Ambulatory Visit (HOSPITAL_COMMUNITY): Payer: Self-pay

## 2023-04-07 MED ORDER — TORSEMIDE 20 MG PO TABS
40.0000 mg | ORAL_TABLET | Freq: Every day | ORAL | 0 refills | Status: DC
  Filled 2023-04-07: qty 60, 30d supply, fill #0

## 2023-04-19 ENCOUNTER — Other Ambulatory Visit: Payer: Self-pay

## 2023-04-19 ENCOUNTER — Inpatient Hospital Stay (HOSPITAL_COMMUNITY)
Admission: EM | Admit: 2023-04-19 | Discharge: 2023-04-23 | DRG: 291 | Disposition: A | Discharge status: Home / Self Care | Payer: 59 | Attending: Internal Medicine | Admitting: Internal Medicine

## 2023-04-19 ENCOUNTER — Encounter (HOSPITAL_COMMUNITY): Payer: Self-pay | Admitting: Internal Medicine

## 2023-04-19 ENCOUNTER — Other Ambulatory Visit (HOSPITAL_COMMUNITY): Payer: 59

## 2023-04-19 ENCOUNTER — Emergency Department (HOSPITAL_COMMUNITY): Payer: 59

## 2023-04-19 DIAGNOSIS — Z95810 Presence of automatic (implantable) cardiac defibrillator: Secondary | ICD-10-CM

## 2023-04-19 DIAGNOSIS — E669 Obesity, unspecified: Secondary | ICD-10-CM

## 2023-04-19 DIAGNOSIS — I10 Essential (primary) hypertension: Secondary | ICD-10-CM

## 2023-04-19 DIAGNOSIS — J189 Pneumonia, unspecified organism: Principal | ICD-10-CM

## 2023-04-19 DIAGNOSIS — I5023 Acute on chronic systolic (congestive) heart failure: Secondary | ICD-10-CM | POA: Diagnosis not present

## 2023-04-19 DIAGNOSIS — E119 Type 2 diabetes mellitus without complications: Secondary | ICD-10-CM

## 2023-04-19 DIAGNOSIS — E1165 Type 2 diabetes mellitus with hyperglycemia: Secondary | ICD-10-CM | POA: Diagnosis present

## 2023-04-19 DIAGNOSIS — Z7951 Long term (current) use of inhaled steroids: Secondary | ICD-10-CM

## 2023-04-19 DIAGNOSIS — E785 Hyperlipidemia, unspecified: Secondary | ICD-10-CM

## 2023-04-19 DIAGNOSIS — Z1152 Encounter for screening for COVID-19: Secondary | ICD-10-CM

## 2023-04-19 DIAGNOSIS — Z6839 Body mass index (BMI) 39.0-39.9, adult: Secondary | ICD-10-CM

## 2023-04-19 DIAGNOSIS — I110 Hypertensive heart disease with heart failure: Principal | ICD-10-CM | POA: Diagnosis present

## 2023-04-19 DIAGNOSIS — Z87891 Personal history of nicotine dependence: Secondary | ICD-10-CM

## 2023-04-19 DIAGNOSIS — I428 Other cardiomyopathies: Secondary | ICD-10-CM | POA: Diagnosis present

## 2023-04-19 DIAGNOSIS — I509 Heart failure, unspecified: Secondary | ICD-10-CM

## 2023-04-19 DIAGNOSIS — Z888 Allergy status to other drugs, medicaments and biological substances status: Secondary | ICD-10-CM

## 2023-04-19 DIAGNOSIS — Y636 Underdosing and nonadministration of necessary drug, medicament or biological substance: Secondary | ICD-10-CM | POA: Diagnosis present

## 2023-04-19 DIAGNOSIS — Z8619 Personal history of other infectious and parasitic diseases: Secondary | ICD-10-CM

## 2023-04-19 DIAGNOSIS — J9601 Acute respiratory failure with hypoxia: Secondary | ICD-10-CM | POA: Diagnosis present

## 2023-04-19 DIAGNOSIS — D696 Thrombocytopenia, unspecified: Secondary | ICD-10-CM | POA: Diagnosis present

## 2023-04-19 DIAGNOSIS — I5022 Chronic systolic (congestive) heart failure: Secondary | ICD-10-CM | POA: Diagnosis present

## 2023-04-19 DIAGNOSIS — G4733 Obstructive sleep apnea (adult) (pediatric): Secondary | ICD-10-CM

## 2023-04-19 DIAGNOSIS — Z713 Dietary counseling and surveillance: Secondary | ICD-10-CM

## 2023-04-19 DIAGNOSIS — Z833 Family history of diabetes mellitus: Secondary | ICD-10-CM

## 2023-04-19 DIAGNOSIS — Z79899 Other long term (current) drug therapy: Secondary | ICD-10-CM

## 2023-04-19 DIAGNOSIS — D7589 Other specified diseases of blood and blood-forming organs: Secondary | ICD-10-CM | POA: Diagnosis present

## 2023-04-19 LAB — RESPIRATORY PANEL BY PCR

## 2023-04-19 LAB — CBC
HCT: 43.2 % (ref 39.0–52.0)
Hemoglobin: 14 g/dL (ref 13.0–17.0)
MCH: 32.5 pg (ref 26.0–34.0)
MCHC: 32.4 g/dL (ref 30.0–36.0)
MCV: 100.2 fL — ABNORMAL HIGH (ref 80.0–100.0)
Platelets: 142 10*3/uL — ABNORMAL LOW (ref 150–400)
RBC: 4.31 MIL/uL (ref 4.22–5.81)
RDW: 14.1 % (ref 11.5–15.5)
WBC: 7.3 10*3/uL (ref 4.0–10.5)
nRBC: 0.3 % — ABNORMAL HIGH (ref 0.0–0.2)

## 2023-04-19 LAB — I-STAT VENOUS BLOOD GAS, ED
Acid-Base Excess: 8 mmol/L — ABNORMAL HIGH (ref 0.0–2.0)
Bicarbonate: 36.2 mmol/L — ABNORMAL HIGH (ref 20.0–28.0)
Calcium, Ion: 1.02 mmol/L — ABNORMAL LOW (ref 1.15–1.40)
HCT: 43 % (ref 39.0–52.0)
Hemoglobin: 14.6 g/dL (ref 13.0–17.0)
O2 Saturation: 99 %
Potassium: 4 mmol/L (ref 3.5–5.1)
Sodium: 138 mmol/L (ref 135–145)
TCO2: 38 mmol/L — ABNORMAL HIGH (ref 22–32)
pCO2, Ven: 65.2 mm[Hg] — ABNORMAL HIGH (ref 44–60)
pH, Ven: 7.353 (ref 7.25–7.43)
pO2, Ven: 136 mm[Hg] — ABNORMAL HIGH (ref 32–45)

## 2023-04-19 LAB — RESP PANEL BY RT-PCR (RSV, FLU A&B, COVID)  RVPGX2
Influenza A by PCR: NEGATIVE
Influenza B by PCR: NEGATIVE
Resp Syncytial Virus by PCR: NEGATIVE
SARS Coronavirus 2 by RT PCR: NEGATIVE

## 2023-04-19 LAB — PROCALCITONIN: Procalcitonin: 0.24 ng/mL

## 2023-04-19 LAB — BASIC METABOLIC PANEL
Anion gap: 13 (ref 5–15)
BUN: 22 mg/dL — ABNORMAL HIGH (ref 6–20)
CO2: 28 mmol/L (ref 22–32)
Calcium: 8 mg/dL — ABNORMAL LOW (ref 8.9–10.3)
Chloride: 97 mmol/L — ABNORMAL LOW (ref 98–111)
Creatinine, Ser: 1.23 mg/dL (ref 0.61–1.24)
GFR, Estimated: >60 mL/min (ref >60)
Glucose, Bld: 154 mg/dL — ABNORMAL HIGH (ref 70–99)
Potassium: 3.9 mmol/L (ref 3.5–5.1)
Sodium: 138 mmol/L (ref 135–145)

## 2023-04-19 LAB — BRAIN NATRIURETIC PEPTIDE: B Natriuretic Peptide: 320.4 pg/mL — ABNORMAL HIGH (ref 0.0–100.0)

## 2023-04-19 LAB — HIV ANTIBODY (ROUTINE TESTING W REFLEX): HIV Screen 4th Generation wRfx: NONREACTIVE

## 2023-04-19 LAB — TROPONIN I (HIGH SENSITIVITY)
Troponin I (High Sensitivity): 34 ng/L — ABNORMAL HIGH (ref <18)
Troponin I (High Sensitivity): 32 ng/L — ABNORMAL HIGH (ref <18)

## 2023-04-19 LAB — MAGNESIUM: Magnesium: 3.3 mg/dL — ABNORMAL HIGH (ref 1.7–2.4)

## 2023-04-19 MED ORDER — FLUTICASONE FUROATE-VILANTEROL 100-25 MCG/ACT IN AEPB
1.0000 | INHALATION_SPRAY | Freq: Every day | RESPIRATORY_TRACT | Status: DC
  Administered 2023-04-20 – 2023-04-22 (×3): 1 via RESPIRATORY_TRACT
  Filled 2023-04-19: qty 28

## 2023-04-19 MED ORDER — SODIUM CHLORIDE 0.9 % IV SOLN
1.0000 g | Freq: Once | INTRAVENOUS | Status: AC
  Administered 2023-04-19: 1 g via INTRAVENOUS
  Filled 2023-04-19: qty 10

## 2023-04-19 MED ORDER — ENOXAPARIN SODIUM 60 MG/0.6ML IJ SOSY
50.0000 mg | PREFILLED_SYRINGE | INTRAMUSCULAR | Status: DC
  Administered 2023-04-19 – 2023-04-22 (×4): 50 mg via SUBCUTANEOUS
  Filled 2023-04-19 (×4): qty 0.6

## 2023-04-19 MED ORDER — SODIUM CHLORIDE 0.9% FLUSH
3.0000 mL | Freq: Two times a day (BID) | INTRAVENOUS | Status: DC
  Administered 2023-04-19 – 2023-04-22 (×7): 3 mL via INTRAVENOUS

## 2023-04-19 MED ORDER — POLYETHYLENE GLYCOL 3350 17 G PO PACK: 17.0000 g | PACK | Freq: Every day | ORAL | Status: DC | PRN

## 2023-04-19 MED ORDER — MAGNESIUM SULFATE 2 GM/50ML IV SOLN
2.0000 g | Freq: Once | INTRAVENOUS | Status: AC
  Administered 2023-04-19: 2 g via INTRAVENOUS
  Filled 2023-04-19: qty 50

## 2023-04-19 MED ORDER — IPRATROPIUM-ALBUTEROL 0.5-2.5 (3) MG/3ML IN SOLN
3.0000 mL | Freq: Once | RESPIRATORY_TRACT | Status: AC
  Administered 2023-04-19: 3 mL via RESPIRATORY_TRACT
  Filled 2023-04-19: qty 3

## 2023-04-19 MED ORDER — ALBUTEROL SULFATE (2.5 MG/3ML) 0.083% IN NEBU
3.0000 mL | INHALATION_SOLUTION | RESPIRATORY_TRACT | Status: DC | PRN
  Administered 2023-04-19 (×2): 3 mL via RESPIRATORY_TRACT
  Filled 2023-04-19 (×2): qty 3

## 2023-04-19 MED ORDER — ACETAMINOPHEN 650 MG RE SUPP: 650.0000 mg | Freq: Four times a day (QID) | RECTAL | Status: DC | PRN

## 2023-04-19 MED ORDER — FUROSEMIDE 10 MG/ML IJ SOLN
40.0000 mg | Freq: Once | INTRAMUSCULAR | Status: AC
  Administered 2023-04-19: 40 mg via INTRAVENOUS
  Filled 2023-04-19: qty 4

## 2023-04-19 MED ORDER — SODIUM CHLORIDE 0.9 % IV SOLN
500.0000 mg | Freq: Once | INTRAVENOUS | Status: AC
  Administered 2023-04-19: 500 mg via INTRAVENOUS
  Filled 2023-04-19: qty 5

## 2023-04-19 MED ORDER — ACETAMINOPHEN 325 MG PO TABS
650.0000 mg | ORAL_TABLET | Freq: Four times a day (QID) | ORAL | Status: DC | PRN
  Administered 2023-04-19 – 2023-04-22 (×3): 650 mg via ORAL
  Filled 2023-04-19 (×3): qty 2

## 2023-04-19 MED ORDER — GUAIFENESIN-DM 100-10 MG/5ML PO SYRP
5.0000 mL | ORAL_SOLUTION | ORAL | Status: DC | PRN
  Administered 2023-04-19: 5 mL via ORAL
  Filled 2023-04-19: qty 5

## 2023-04-19 MED ORDER — SPIRONOLACTONE 12.5 MG HALF TABLET: 25.0000 mg | ORAL_TABLET | Freq: Every day | ORAL | Status: DC

## 2023-04-19 MED ORDER — CARVEDILOL 6.25 MG PO TABS
6.2500 mg | ORAL_TABLET | Freq: Two times a day (BID) | ORAL | Status: DC
  Administered 2023-04-19 – 2023-04-23 (×6): 6.25 mg via ORAL
  Filled 2023-04-19 (×2): qty 1
  Filled 2023-04-19: qty 2
  Filled 2023-04-19 (×5): qty 1

## 2023-04-19 MED ORDER — FUROSEMIDE 10 MG/ML IJ SOLN
80.0000 mg | Freq: Two times a day (BID) | INTRAMUSCULAR | Status: DC
  Administered 2023-04-20 – 2023-04-23 (×6): 80 mg via INTRAVENOUS
  Filled 2023-04-19 (×7): qty 8

## 2023-04-19 MED ORDER — METHYLPREDNISOLONE SODIUM SUCC 125 MG IJ SOLR
125.0000 mg | Freq: Once | INTRAMUSCULAR | Status: AC
  Administered 2023-04-19: 125 mg via INTRAVENOUS
  Filled 2023-04-19: qty 2

## 2023-04-19 MED ORDER — SIMVASTATIN 20 MG PO TABS
20.0000 mg | ORAL_TABLET | Freq: Every evening | ORAL | Status: DC
  Administered 2023-04-19 – 2023-04-22 (×4): 20 mg via ORAL
  Filled 2023-04-19 (×4): qty 1

## 2023-04-19 MED ORDER — DAPAGLIFLOZIN PROPANEDIOL 10 MG PO TABS
10.0000 mg | ORAL_TABLET | Freq: Every day | ORAL | Status: DC
  Administered 2023-04-20 – 2023-04-23 (×4): 10 mg via ORAL
  Filled 2023-04-19 (×4): qty 1

## 2023-04-19 NOTE — ED Notes (Signed)
 Pt to radiology.

## 2023-04-19 NOTE — ED Triage Notes (Signed)
 Pt improved to 93% on 4 lpm O2 via [place]

## 2023-04-19 NOTE — ED Triage Notes (Signed)
 Pt c/o midsternal CP and SOB worsening since Friday. During triage pt noted to be 71-73% on RA. Pt denies recent fevers.

## 2023-04-19 NOTE — H&P (Signed)
 History and Physical   Kevin Cardenas FMW:[id number] DOB: December 20, 1975 DOA: 04/19/2023  PCP: Kevin Bleacher, MD   Patient coming from: Home  Chief Complaint: Shortness of breath  HPI: Kevin Cardenas is a 48 y.o. male with medical history significant of hypertension, hyperlipidemia, chronic's systolic CHF, asthma, OSA, obesity presenting with worsening shortness of breath.  Patient reports worsening shortness of breath for the past 2 days.  Significant LEE worse with exertion.  Has tried his home inhalers without improvement.  Also reports some mildly increasing edema.  Has been taking his home diuretics and other medications as prescribed.  Does report some associated chest tightness in the middle of his chest.  Denies fevers, chills, abdominal pain, constipation, diarrhea, nausea, vomiting.  ED Course: Vital signs in the ED notable for blood pressure in the 100s to 120s systolic, heart rate in the 90s to 110s, requiring 4 to 6 L to maintain saturations in the ED.  Lab workup included BMP with chloride 97, BUN 22, glucose 154, calcium  8.0.  CBC with platelets mildly decreased at 142.  Troponin mildly elevated at 34 with repeat pending.  BNP elevated at 320.  Discharge panel for flu COVID and RSV negative.  Chest x-ray showed evidence of right upper lobe opacity most consistent with pneumonia, cardiomegaly also noted.  Patient received ceftriaxone , azithromycin  in ED.  Also received a dose of Lasix  as well as Solu-Medrol , magnesium , DuoNeb.  Review of Systems: As per HPI otherwise all other systems reviewed and are negative.  Past Medical History:  Diagnosis Date   Acute on chronic systolic and diastolic heart failure, NYHA class 3 (HCC) 06/19/2021   AICD (automatic cardioverter/defibrillator) present    Chronic systolic CHF (congestive heart failure) (HCC)    EF 25% by echo 06/2018   Diabetes mellitus    Hypertension    Medication monitoring encounter 09/19/2021   Non-ischemic  cardiomyopathy (HCC)    Obstructive sleep apnea    Orthostatic hypotension 11/04/2019   PICC (peripherally inserted central catheter) in place 09/20/2021   VSD (ventricular septal defect) 1978   repaired at age 21    Past Surgical History:  Procedure Laterality Date   EP IMPLANTABLE DEVICE N/A 01/19/2015   Procedure: ICD Implant;  Surgeon: Elspeth JAYSON Sage, MD;  Location: [facility] INVASIVE CV LAB;  Service: Cardiovascular;  Laterality: N/A;   ICD LEAD REMOVAL  09/05/2021   Procedure: ICD LASER LEAD REMOVAL;  Surgeon: Cindie Ole DASEN, MD;  Location: [facility] OR;  Service: Cardiovascular;;   IMPLANTABLE CARDIOVERTER DEFIBRILLATOR (ICD) GENERATOR CHANGE N/A 09/05/2021   Procedure: DEFIBRILLATOR GENERATOR REMOVAL;  Surgeon: Cindie Ole DASEN, MD;  Location: [facility];  Service: Cardiovascular;  Laterality: N/A;   RIGHT HEART CATHETERIZATION N/A 08/03/2014   Procedure: RIGHT HEART CATH;  Surgeon: Ezra GORMAN Shuck, MD;  Location: [facility] CATH LAB;  Service: Cardiovascular;  Laterality: N/A;   RIGHT/LEFT HEART CATH AND CORONARY ANGIOGRAPHY N/A 06/24/2021   Procedure: RIGHT/LEFT HEART CATH AND CORONARY ANGIOGRAPHY;  Surgeon: Shuck Ezra GORMAN, MD;  Location: [facility] INVASIVE CV LAB;  Service: Cardiovascular;  Laterality: N/A;   TEE WITHOUT CARDIOVERSION N/A 09/04/2021   Procedure: TRANSESOPHAGEAL ECHOCARDIOGRAM (TEE);  Surgeon: Shuck Ezra GORMAN, MD;  Location: [facility] ENDOSCOPY;  Service: Cardiovascular;  Laterality: N/A;   TEE WITHOUT CARDIOVERSION N/A 09/05/2021   Procedure: TRANSESOPHAGEAL ECHOCARDIOGRAM (TEE);  Surgeon: Cindie Ole DASEN, MD;  Location: [facility] OR;  Service: Cardiovascular;  Laterality: N/A;   VSD REPAIR      Social History  reports that  he quit smoking about 12 years ago. His smoking use included cigarettes. He started smoking about 27 years ago. He has a 4.5 pack-year smoking history. He has never used smokeless tobacco. He reports that he does not drink alcohol and does not use drugs.  Allergies  Allergen  Reactions   Metolazone  Other (See Comments)    Dried up my kidneys- took only one tablet    Family History  Problem Relation Age of Onset   Lung cancer Father    Stroke Father    Stroke Mother    Diabetes Maternal Grandfather    Diabetes Paternal Grandmother    Cancer Neg Hx    Alcohol abuse Neg Hx    Drug abuse Neg Hx    Early death Neg Hx    Heart disease Neg Hx    Hyperlipidemia Neg Hx    Hypertension Neg Hx    Kidney disease Neg Hx   Reviewed on admission  Prior to Admission medications   Medication Sig Start Date End Date Taking? Authorizing Provider  albuterol  (VENTOLIN  HFA) 108 (90 Base) MCG/ACT inhaler Inhale 1-2 puffs into the lungs every 1 - 2 hours for up to 3 doses, then every 2 to 4 hours, as needed for shortness of breath. Contact PCP if shortness of breath persists after 6 puffs. 01/26/23     ascorbic acid (VITAMIN C) 500 MG tablet Take 500 mg by mouth daily.    [provider]  carvedilol  (COREG ) 6.25 MG tablet Take 1 tablet (6.25 mg total) by mouth 2 (two) times daily with a meal. NEEDS FOLLOW UP APPOINTMENT FOR MORE REFILLS 03/03/23   Kevin Ezra RAMAN, MD  cholecalciferol (VITAMIN D3) 25 MCG (1000 UNIT) tablet Take 1 tablet (25 mcg total) by mouth daily for Vitamin D deficiency 08/18/22     dapagliflozin  propanediol (FARXIGA ) 10 MG TABS tablet Take 1 tablet (10 mg total) by mouth daily. 09/06/21   Cardenas, [name], MD  dapagliflozin  propanediol (FARXIGA ) 10 MG TABS tablet Take 1 tablet (10 mg total) by mouth in the morning. 11/07/22     dapagliflozin  propanediol (FARXIGA ) 10 MG TABS tablet Take 1 tablet (10 mg total) by mouth in the morning for diabetes and heart failure 12/08/22     fluticasone  furoate-vilanterol (BREO ELLIPTA ) 100-25 MCG/ACT AEPB Inhale 1 puff into the lungs daily. 07/17/21   Kevin Bleacher, MD  metroNIDAZOLE  (FLAGYL ) 500 MG tablet Take 1 tablet (500 mg total) by mouth every 8 (eight) hours for dental infection for 7 days Patient not  taking: Reported on 12/04/2022 10/14/22     potassium chloride  SA (KLOR-CON  M) 20 MEQ tablet Take 2 tablets (40 mEq total) by mouth daily. 03/03/23   Kevin Ezra RAMAN, MD  sacubitril -valsartan  (ENTRESTO ) 49-51 MG Take 1 tablet by mouth 2 (two) times daily. 07/03/21   Kevin Ezra RAMAN, MD  simvastatin  (ZOCOR ) 20 MG tablet Take 1 tablet (20 mg total) by mouth at bedtime. 03/04/22   Kevin Ezra RAMAN, MD  simvastatin  (ZOCOR ) 20 MG tablet Take 1 tablet (20 mg total) by mouth every evening for hyperlipidemia 11/10/22     simvastatin  (ZOCOR ) 20 MG tablet Take 1 tablet (20 mg total) by mouth every evening for hyperlipidemia. 03/30/23     spironolactone  (ALDACTONE ) 25 MG tablet Take 1 tablet (25 mg total) by mouth daily. 01/01/22   Kevin Ezra RAMAN, MD  torsemide  (DEMADEX ) 20 MG tablet Take 2 tablets (40 mg total) by mouth daily. NEEDS FOLLOW UP APPOINTMENT FOR MORE REFILLS 04/07/23  Kevin Ezra RAMAN, MD    Physical Exam: Vitals:   04/19/23 1150 04/19/23 1205 04/19/23 1245 04/19/23 1246  BP: (!) 121/96 127/67 (!) 120/92   Pulse: 92 88 84   Resp: 17 16 16    Temp:    98.8 F (37.1 C)  TempSrc:    Oral  SpO2: 93% 93% 95%   Weight:      Height:        Physical Exam Constitutional:      General: He is not in acute distress.    Appearance: Normal appearance. He is obese.  HENT:     Head: Normocephalic and atraumatic.     Mouth/Throat:     Mouth: Mucous membranes are moist.     Pharynx: Oropharynx is clear.  Eyes:     Extraocular Movements: Extraocular movements intact.     Pupils: Pupils are equal, round, and reactive to light.  Cardiovascular:     Rate and Rhythm: Normal rate and regular rhythm.     Pulses: Normal pulses.     Heart sounds: Normal heart sounds.  Pulmonary:     Effort: Pulmonary effort is normal. No respiratory distress.     Breath sounds: Examination of the right-upper field reveals wheezing. Decreased breath sounds and wheezing present.  Abdominal:     General: Bowel sounds  are normal. There is no distension.     Palpations: Abdomen is soft.     Tenderness: There is no abdominal tenderness.  Musculoskeletal:        General: No swelling or deformity.     Right lower leg: Edema present.     Left lower leg: Edema present.  Skin:    General: Skin is warm and dry.  Neurological:     General: No focal deficit present.     Mental Status: Mental status is at baseline.    Labs on Admission: I have personally reviewed following labs and imaging studies  CBC: Recent Labs  Lab 04/19/23 0902 04/19/23 1019  WBC 7.3  --   HGB 14.0 14.6  HCT 43.2 43.0  MCV 100.2*  --   PLT 142*  --     Basic Metabolic Panel: Recent Labs  Lab 04/19/23 0902 04/19/23 1019  NA 138 138  K 3.9 4.0  CL 97*  --   CO2 28  --   GLUCOSE 154*  --   BUN 22*  --   CREATININE 1.23  --   CALCIUM  8.0*  --     GFR: Estimated Creatinine Clearance: 83.1 mL/min (by C-G formula based on SCr of 1.23 mg/dL).  Liver Function Tests: No results for input(s): AST, ALT, ALKPHOS, BILITOT, PROT, ALBUMIN in the last 168 hours.  Urine analysis:    Component Value Date/Time   COLORURINE AMBER (A) 09/01/2021 1324   APPEARANCEUR HAZY (A) 09/01/2021 1324   LABSPEC 1.018 09/01/2021 1324   PHURINE 5.0 09/01/2021 1324   GLUCOSEU NEGATIVE 09/01/2021 1324   GLUCOSEU NEGATIVE 01/15/2016 1600   HGBUR NEGATIVE 09/01/2021 1324   BILIRUBINUR NEGATIVE 09/01/2021 1324   KETONESUR 5 (A) 09/01/2021 1324   PROTEINUR 30 (A) 09/01/2021 1324   UROBILINOGEN 0.2 01/15/2016 1600   NITRITE NEGATIVE 09/01/2021 1324   LEUKOCYTESUR NEGATIVE 09/01/2021 1324    Radiological Exams on Admission: DG Chest 2 View Result Date: 04/19/2023 CLINICAL DATA:  Shortness of breath and chest pain EXAM: CHEST - 2 VIEW COMPARISON:  09/02/2021 FINDINGS: Airspace disease in the right upper lobe best seen on the frontal view.  Chronic cardiomegaly with single chamber pacer on the left no longer seen. No visible effusion  or cavitation. No pneumothorax. IMPRESSION: 1. Right upper lobe opacity, usually pneumonia. Recommend follow-up to clearing. 2. Cardiomegaly. Electronically Signed   By: Dorn Roulette M.D.   On: 04/19/2023 11:08    EKG: Independently reviewed.  Sinus tachycardia at 114 bpm.  Right bundle branch block with QRS 160.  Prolonged QTc at 518.  Evidence of LVH with repolarization abnormality.  Baseline wander.  Right bundle blanch block appears to be new from previous EKG.  Other morphology similar however repolarization abnormalities significantly more pronounced than previous.  Assessment/Plan Principal Problem:   Acute respiratory failure with hypoxia (HCC) Active Problems:   Systolic CHF, chronic (HCC)   HTN (hypertension)   Hyperlipidemia with target LDL less than 100   OSA on CPAP   Obese   PNA (pneumonia)   Acute on chronic systolic CHF (congestive heart failure) (HCC)   Acute respiratory failure with hypoxia Pneumonia, viral versus bacterial CHF exacerbation > Patient presenting with 2 days of worsening shortness of breath. > Has had some worsening edema despite taking his diuretics at home > Imaging in the ED significant for right upper lobe opacity most consistent with pneumonia.  Persistent cardiomegaly. > On exam LE edema, RUL wheeze. Distant Breath sounds. > Lab workup shows elevated BNP to 320.  No leukocytosis.  Respiratory panel for flu COVID RSV negative.  Has remained afebrile. > Certainly appears to have a component of CHF exacerbation given his edema and elevated BNP.  This could be secondary to increased pulmonary hypertension with opacity/pneumonia worsening right heart failure in the setting of previously normal RV function but severely dilated right ventricle. > Last echo was in 2023 with EF 35-40%, indeterminate diastolic function, normal RV function with severely dilated right ventricle.  Is due for cardiology follow-up.  History of AICD, but this was removed after  endocarditis/lead infection. > Will proceed with treatment of CHF exacerbation and evaluate for viral versus bacterial pneumonia.  Hold off on further antibiotics as he will have coverage for the next 24 hours.  Received ceftriaxone , azithromycin , Solu-Medrol , magnesium , DuoNeb, Lasix  in the ED. - Monitor in progressive unit overnight - Continue supplemental oxygen, wean as tolerated For CHF - Continue with Lasix  80 mg twice daily - Echocardiogram - Check magnesium  - Strict I's and O's, daily weights - Trend renal function and electrolytes - Continue home Coreg , Farxiga  - Entresto  and Spironolactone  (once confirmed by pharmacy) For pneumonia - Check procalcitonin to further delineate between viral and bacterial etiology - Hold off on further antibiotics at this time - Check full respiratory viral panel  Hypertension - Lasix  as above - Spironolactone , Coreg , Entresto   Hyperlipidemia - Continue on simvastatin   Asthma - Continue home Breo and as needed albuterol   OSA - Not on CPAP  Obesity - Noted   DVT prophylaxis: Lovenox  Code Status:   Full Family Communication:  Updated at bedside  Disposition Plan:   Patient is from:  Home  Anticipated DC to:  Home  Anticipated DC date:  1 to 5 days  Anticipated DC barriers: None  Consults called:  None Admission status:  Observation, progressive  Severity of Illness: The appropriate patient status for this patient is OBSERVATION. Observation status is judged to be reasonable and necessary in order to provide the required intensity of service to ensure the patient's safety. The patient's presenting symptoms, physical exam findings, and initial radiographic and laboratory data in the context  of their medical condition is felt to place them at decreased risk for further clinical deterioration. Furthermore, it is anticipated that the patient will be medically stable for discharge from the hospital within 2 midnights of admission.     Marsa KATHEE Scurry MD [facility]  How to contact the [facility] Attending or Consulting provider 7A - 7P or covering provider during after hours 7P -7A, for this patient?   Check the care team in [facility] and look for a) attending/consulting [facility] provider listed and b) the [facility] team listed Log into [email or web address] and use [facility] universal password to access. If you do not have the password, please contact the hospital operator. Locate the [facility] provider you are looking for under [facility] and page to a number that you can be directly reached. If you still have difficulty reaching the provider, please page the [facility] (Director on Call) for the Hospitalists listed on amion for assistance.  04/19/2023, 1:20 PM

## 2023-04-19 NOTE — ED Notes (Signed)
 ED TO INPATIENT HANDOFF REPORT  ED Nurse Name and Phone #: Topher [phone]  S Name/Age/Gender Kevin Cardenas Ill 48 y.o. male Room/Bed: 039C/039C  Code Status   Code Status: Full Code  Home/SNF/Other Home Patient oriented to: self, place, time, and situation Is this baseline? Yes   Triage Complete: Triage complete  Chief Complaint Acute respiratory failure with hypoxia (HCC) [J96.01]  Triage Note Pt c/o midsternal CP and SOB worsening since Friday. During triage pt noted to be 71-73% on RA. Pt denies recent fevers.   Pt improved to 93% on 4 lpm O2 via [place]   Allergies Allergies  Allergen Reactions   Metolazone  Other (See Comments)    Dried up my kidneys- took only one tablet    Level of Care/Admitting Diagnosis ED Disposition     ED Disposition  Admit   Condition  --   Comment  Hospital Area: [facility] [[id number]]  Level of Care: Progressive [102]  Admit to Progressive based on following criteria: CARDIOVASCULAR & THORACIC of moderate stability with acute coronary syndrome symptoms/low risk myocardial infarction/hypertensive urgency/arrhythmias/heart failure potentially compromising stability and stable post cardiovascular intervention patients.  Admit to Progressive based on following criteria: RESPIRATORY PROBLEMS hypoxemic/hypercapnic respiratory failure that is responsive to NIPPV (BiPAP) or High Flow Nasal Cannula (6-80 lpm). Frequent assessment/intervention, no > Q2 hrs < Q4 hrs, to maintain oxygenation and pulmonary hygiene.  May place patient in observation at [facility] or [facility] if equivalent level of care is available:: No  Covid Evaluation: Asymptomatic - no recent exposure (last 10 days) testing not required  Diagnosis: Acute respiratory failure with hypoxia [facility]) [[id number]]  Admitting Physician: MELVIN, ALEXANDER B [[id number]]  Attending Physician: MELVIN, ALEXANDER B [[id number]]          B Medical/Surgery History Past Medical History:   Diagnosis Date   Acute on chronic systolic and diastolic heart failure, NYHA class 3 (HCC) 06/19/2021   AICD (automatic cardioverter/defibrillator) present    Chronic systolic CHF (congestive heart failure) (HCC)    EF 25% by echo 06/2018   Diabetes mellitus    Hypertension    Medication monitoring encounter 09/19/2021   Non-ischemic cardiomyopathy (HCC)    Obstructive sleep apnea    Orthostatic hypotension 11/04/2019   PICC (peripherally inserted central catheter) in place 09/20/2021   VSD (ventricular septal defect) 1978   repaired at age 20   Past Surgical History:  Procedure Laterality Date   EP IMPLANTABLE DEVICE N/A 01/19/2015   Procedure: ICD Implant;  Surgeon: Elspeth JAYSON Sage, MD;  Location: [facility] INVASIVE CV LAB;  Service: Cardiovascular;  Laterality: N/A;   ICD LEAD REMOVAL  09/05/2021   Procedure: ICD LASER LEAD REMOVAL;  Surgeon: Cindie Ole DASEN, MD;  Location: [facility] OR;  Service: Cardiovascular;;   IMPLANTABLE CARDIOVERTER DEFIBRILLATOR (ICD) GENERATOR CHANGE N/A 09/05/2021   Procedure: DEFIBRILLATOR GENERATOR REMOVAL;  Surgeon: Cindie Ole DASEN, MD;  Location: [facility] OR;  Service: Cardiovascular;  Laterality: N/A;   RIGHT HEART CATHETERIZATION N/A 08/03/2014   Procedure: RIGHT HEART CATH;  Surgeon: Ezra GORMAN Shuck, MD;  Location: [facility] CATH LAB;  Service: Cardiovascular;  Laterality: N/A;   RIGHT/LEFT HEART CATH AND CORONARY ANGIOGRAPHY N/A 06/24/2021   Procedure: RIGHT/LEFT HEART CATH AND CORONARY ANGIOGRAPHY;  Surgeon: Shuck Ezra GORMAN, MD;  Location: [facility] INVASIVE CV LAB;  Service: Cardiovascular;  Laterality: N/A;   TEE WITHOUT CARDIOVERSION N/A 09/04/2021   Procedure: TRANSESOPHAGEAL ECHOCARDIOGRAM (TEE);  Surgeon: Shuck Ezra GORMAN, MD;  Location: [facility] ENDOSCOPY;  Service: Cardiovascular;  Laterality: N/A;   TEE WITHOUT CARDIOVERSION N/A 09/05/2021   Procedure: TRANSESOPHAGEAL ECHOCARDIOGRAM (TEE);  Surgeon: Cindie Ole DASEN, MD;  Location: [facility] OR;  Service: Cardiovascular;   Laterality: N/A;   VSD REPAIR       A IV Location/Drains/Wounds Patient Lines/Drains/Airways Status     Active Line/Drains/Airways     Name Placement date Placement time Site Days   Peripheral IV 04/19/23 20 G Right Antecubital 04/19/23  1001  Antecubital  less than 1   Midline Single Lumen 10/09/21 Left Brachial 8 cm 0 cm 10/09/21  1114  Brachial  557   Incision (Closed) 09/05/21 Chest Left 09/05/21  1417  -- 591   Incision (Closed) 09/05/21 Groin Right 09/05/21  1709  -- 591   Wound / Incision (Open or Dehisced) 09/01/21 Non-pressure wound Leg Right;Lower 09/01/21  1700  Leg  595            Intake/Output Last 24 hours  Intake/Output Summary (Last 24 hours) at 04/19/2023 1627 Last data filed at 04/19/2023 1317 Gross per 24 hour  Intake --  Output 700 ml  Net -700 ml    Labs/Imaging Results for orders placed or performed during the hospital encounter of 04/19/23 (from the past 48 hours)  Basic metabolic panel     Status: Abnormal   Collection Time: 04/19/23  9:02 AM  Result Value Ref Range   Sodium 138 135 - 145 mmol/L   Potassium 3.9 3.5 - 5.1 mmol/L   Chloride 97 (L) 98 - 111 mmol/L   CO2 28 22 - 32 mmol/L   Glucose, Bld 154 (H) 70 - 99 mg/dL    Comment: Glucose reference range applies only to samples taken after fasting for at least 8 hours.   BUN 22 (H) 6 - 20 mg/dL   Creatinine, Ser 8.76 0.61 - 1.24 mg/dL   Calcium  8.0 (L) 8.9 - 10.3 mg/dL   GFR, Estimated >39 >39 mL/min    Comment: (NOTE) Calculated using the CKD-EPI Creatinine Equation (2021)    Anion gap 13 5 - 15    Comment: Performed at [facility], [address], [place], [place] [address]  CBC     Status: Abnormal   Collection Time: 04/19/23  9:02 AM  Result Value Ref Range   WBC 7.3 4.0 - 10.5 K/uL   RBC 4.31 4.22 - 5.81 MIL/uL   Hemoglobin 14.0 13.0 - 17.0 g/dL   HCT 56.7 60.9 - 47.9 %   MCV 100.2 (H) 80.0 - 100.0 fL   MCH 32.5 26.0 - 34.0 pg   MCHC 32.4 30.0 - 36.0 g/dL   RDW 85.8 88.4  - 84.4 %   Platelets 142 (L) 150 - 400 K/uL   nRBC 0.3 (H) 0.0 - 0.2 %    Comment: Performed at [facility], [address] [place], [place] [address]  Troponin I (High Sensitivity)     Status: Abnormal   Collection Time: 04/19/23  9:02 AM  Result Value Ref Range   Troponin I (High Sensitivity) 34 (H) <18 ng/L    Comment: (NOTE) Elevated high sensitivity troponin I (hsTnI) values and significant  changes across serial measurements may suggest ACS but many other  chronic and acute conditions are known to elevate hsTnI results.  Refer to the Links section for chest pain algorithms and additional  guidance. Performed at [facility], [address], [place], [place] [address]   Brain natriuretic peptide     Status:  Abnormal   Collection Time: 04/19/23  9:02 AM  Result Value Ref Range   B Natriuretic Peptide 320.4 (H) 0.0 - 100.0 pg/mL    Comment: Performed at [facility], [address], [place], [place] [address]  Resp panel by RT-PCR (RSV, Flu A&B, Covid) Anterior Nasal Swab     Status: None   Collection Time: 04/19/23  9:41 AM   Specimen: Anterior Nasal Swab  Result Value Ref Range   SARS Coronavirus 2 by RT PCR NEGATIVE NEGATIVE   Influenza A by PCR NEGATIVE NEGATIVE   Influenza B by PCR NEGATIVE NEGATIVE    Comment: (NOTE) The Xpert Xpress SARS-CoV-2/FLU/RSV plus assay is intended as an aid in the diagnosis of influenza from Nasopharyngeal swab specimens and should not be used as a sole basis for treatment. Nasal washings and aspirates are unacceptable for Xpert Xpress SARS-CoV-2/FLU/RSV testing.  Fact Sheet for Patients: bloggercourse.com  Fact Sheet for Healthcare Providers: seriousbroker.it  This test is not yet approved or cleared by the [place]  FDA and has been authorized for detection and/or diagnosis of SARS-CoV-2 by FDA under an Emergency Use Authorization (EUA). This EUA will  remain in effect (meaning this test can be used) for the duration of the COVID-19 declaration under Section 564(b)(1) of the Act, 21 U.S.C. section 360bbb-3(b)(1), unless the authorization is terminated or revoked.     Resp Syncytial Virus by PCR NEGATIVE NEGATIVE    Comment: (NOTE) Fact Sheet for Patients: bloggercourse.com  Fact Sheet for Healthcare Providers: seriousbroker.it  This test is not yet approved or cleared by the [place]  FDA and has been authorized for detection and/or diagnosis of SARS-CoV-2 by FDA under an Emergency Use Authorization (EUA). This EUA will remain in effect (meaning this test can be used) for the duration of the COVID-19 declaration under Section 564(b)(1) of the Act, 21 U.S.C. section 360bbb-3(b)(1), unless the authorization is terminated or revoked.  Performed at [facility], [address], [place], [place] [address]   I-Stat venous blood gas, [facility] ED, [facility], [facility])     Status: Abnormal   Collection Time: 04/19/23 10:19 AM  Result Value Ref Range   pH, Ven 7.353 7.25 - 7.43   pCO2, Ven 65.2 (H) 44 - 60 mmHg   pO2, Ven 136 (H) 32 - 45 mmHg   Bicarbonate 36.2 (H) 20.0 - 28.0 mmol/L   TCO2 38 (H) 22 - 32 mmol/L   O2 Saturation 99 %   Acid-Base Excess 8.0 (H) 0.0 - 2.0 mmol/L   Sodium 138 135 - 145 mmol/L   Potassium 4.0 3.5 - 5.1 mmol/L   Calcium , Ion 1.02 (L) 1.15 - 1.40 mmol/L   HCT 43.0 39.0 - 52.0 %   Hemoglobin 14.6 13.0 - 17.0 g/dL   Sample type VENOUS   Troponin I (High Sensitivity)     Status: Abnormal   Collection Time: 04/19/23 11:43 AM  Result Value Ref Range   Troponin I (High Sensitivity) 32 (H) <18 ng/L    Comment: (NOTE) Elevated high sensitivity troponin I (hsTnI) values and significant  changes across serial measurements may suggest ACS but many other  chronic and acute conditions are known to elevate hsTnI results.  Refer to the Links section for chest pain  algorithms and additional  guidance. Performed at [facility], [address], [place], [place] [address]   Magnesium      Status: Abnormal   Collection Time: 04/19/23 11:43 AM  Result Value Ref Range  Magnesium  3.3 (H) 1.7 - 2.4 mg/dL    Comment: Performed at [facility], [address], [place], [place] [address]  Procalcitonin     Status: None   Collection Time: 04/19/23 11:43 AM  Result Value Ref Range   Procalcitonin 0.24 ng/mL    Comment:        Interpretation: PCT (Procalcitonin) <= 0.5 ng/mL: Systemic infection (sepsis) is not likely. Local bacterial infection is possible. (NOTE)       Sepsis PCT Algorithm           Lower Respiratory Tract                                      Infection PCT Algorithm    ----------------------------     ----------------------------         PCT < 0.25 ng/mL                PCT < 0.10 ng/mL          Strongly encourage             Strongly discourage   discontinuation of antibiotics    initiation of antibiotics    ----------------------------     -----------------------------       PCT 0.25 - 0.50 ng/mL            PCT 0.10 - 0.25 ng/mL               OR       >80% decrease in PCT            Discourage initiation of                                            antibiotics      Encourage discontinuation           of antibiotics    ----------------------------     -----------------------------         PCT >= 0.50 ng/mL              PCT 0.26 - 0.50 ng/mL               AND        <80% decrease in PCT             Encourage initiation of                                             antibiotics       Encourage continuation           of antibiotics    ----------------------------     -----------------------------        PCT >= 0.50 ng/mL                  PCT > 0.50 ng/mL               AND         increase in PCT                  Strongly encourage  initiation of antibiotics    Strongly encourage  escalation           of antibiotics                                     -----------------------------                                           PCT <= 0.25 ng/mL                                                 OR                                        > 80% decrease in PCT                                      Discontinue / Do not initiate                                             antibiotics  Performed at [facility], [address], [place], [place] [address]    DG Chest 2 View Result Date: 04/19/2023 CLINICAL DATA:  Shortness of breath and chest pain EXAM: CHEST - 2 VIEW COMPARISON:  09/02/2021 FINDINGS: Airspace disease in the right upper lobe best seen on the frontal view. Chronic cardiomegaly with single chamber pacer on the left no longer seen. No visible effusion or cavitation. No pneumothorax. IMPRESSION: 1. Right upper lobe opacity, usually pneumonia. Recommend follow-up to clearing. 2. Cardiomegaly. Electronically Signed   By: Dorn Roulette M.D.   On: 04/19/2023 11:08    Pending Labs Unresulted Labs (From admission, onward)     Start     Ordered   04/26/23 0500  Creatinine, serum  (enoxaparin  (LOVENOX )    CrCl >/= 30 ml/min)  Weekly,   R     Comments: while on enoxaparin  therapy    04/19/23 1230   04/20/23 0500  Comprehensive metabolic panel  Tomorrow morning,   R        04/19/23 1230   04/20/23 0500  CBC  Tomorrow morning,   R        04/19/23 1230   04/19/23 1420  HIV Antibody (routine testing w rflx)  Once,   R        04/19/23 1420   04/19/23 1238  Respiratory (~20 pathogens) panel by PCR  (Respiratory panel by PCR (~20 pathogens, ~24 hr TAT)  w precautions)  Add-on,   AD        04/19/23 1237   04/19/23 1231  Procalcitonin  Daily,   R     References:    Procalcitonin Lower Respiratory Tract Infection AND Sepsis Procalcitonin Algorithm   04/19/23 1230            Vitals/Pain Today's Vitals   04/19/23 1246  04/19/23 1500 04/19/23 1602 04/19/23 1626   BP:  (!) 117/92 (!) 119/94 109/72  Pulse:  87 80 81  Resp:  16 17 (!) 22  Temp: 98.8 F (37.1 C)   97.7 F (36.5 C)  TempSrc: Oral   Oral  SpO2:  95% 99% 96%  Weight:      Height:      PainSc: 0-No pain  6  4     Isolation Precautions Droplet precaution  Medications Medications  simvastatin  (ZOCOR ) tablet 20 mg (has no administration in time range)  carvedilol  (COREG ) tablet 6.25 mg (6.25 mg Oral Given 04/19/23 1609)  dapagliflozin  propanediol (FARXIGA ) tablet 10 mg (has no administration in time range)  fluticasone  furoate-vilanterol (BREO ELLIPTA ) 100-25 MCG/ACT 1 puff (has no administration in time range)  albuterol  (PROVENTIL ) (2.5 MG/3ML) 0.083% nebulizer solution 3 mL (3 mLs Inhalation Given 04/19/23 1609)  enoxaparin  (LOVENOX ) injection 50 mg (has no administration in time range)  furosemide  (LASIX ) injection 80 mg (has no administration in time range)  sodium chloride  flush (NS) 0.9 % injection 3 mL (3 mLs Intravenous Not Given 04/19/23 1242)  acetaminophen  (TYLENOL ) tablet 650 mg (650 mg Oral Given 04/19/23 1404)    Or  acetaminophen  (TYLENOL ) suppository 650 mg ( Rectal See Alternative 04/19/23 1404)  polyethylene glycol (MIRALAX  / GLYCOLAX ) packet 17 g (has no administration in time range)  ipratropium-albuterol  (DUONEB) 0.5-2.5 (3) MG/3ML nebulizer solution 3 mL (3 mLs Nebulization Given 04/19/23 0956)  methylPREDNISolone  sodium succinate (SOLU-MEDROL ) 125 mg/2 mL injection 125 mg (125 mg Intravenous Given 04/19/23 1002)  magnesium  sulfate IVPB 2 g 50 mL (0 g Intravenous Stopped 04/19/23 1148)  furosemide  (LASIX ) injection 40 mg (40 mg Intravenous Given 04/19/23 1150)  cefTRIAXone  (ROCEPHIN ) 1 g in sodium chloride  0.9 % 100 mL IVPB (0 g Intravenous Stopped 04/19/23 1242)  azithromycin  (ZITHROMAX ) 500 mg in sodium chloride  0.9 % 250 mL IVPB (0 mg Intravenous Stopped 04/19/23 1404)    Mobility non-ambulatory     Focused Assessments Cardiac Assessment Handoff:  Cardiac Rhythm:  Normal sinus rhythm Lab Results  Component Value Date   CKTOTAL 275 (H) 01/01/2010   CKMB 2.9 01/01/2010   TROPONINI 0.03        NO INDICATION OF MYOCARDIAL INJURY. 01/01/2010   Lab Results  Component Value Date   DDIMER 2.52 (H) 09/01/2021   Does the Patient currently have chest pain? Yes    R Recommendations: See Admitting Provider Note  Report given to:   Additional Notes:

## 2023-04-19 NOTE — ED Provider Notes (Signed)
 [facility] Provider Note   CSN: [id number] Arrival date & time: 04/19/23  9160     History  Chief Complaint  Patient presents with   Shortness of Breath    ALLIN FRIX is a 48 y.o. male with past medical history significant for heart failure, hypertension, asthma, obesity, without tobacco abuse who presents with concern for shortness of breath, midsternal chest pain since Friday.  He reports worse with exertion, not significantly improved with his home inhalers.  He reports some increased fluid on his legs, he has been taking his torsemide  as prescribed.   Shortness of Breath      Home Medications Prior to Admission medications   Medication Sig Start Date End Date Taking? Authorizing Provider  albuterol  (VENTOLIN  HFA) 108 (90 Base) MCG/ACT inhaler Inhale 1-2 puffs into the lungs every 1 - 2 hours for up to 3 doses, then every 2 to 4 hours, as needed for shortness of breath. Contact PCP if shortness of breath persists after 6 puffs. 01/26/23  Yes   ascorbic acid (VITAMIN C) 500 MG tablet Take 500 mg by mouth daily.   Yes [provider]  carvedilol  (COREG ) 6.25 MG tablet Take 1 tablet (6.25 mg total) by mouth 2 (two) times daily with a meal. NEEDS FOLLOW UP APPOINTMENT FOR MORE REFILLS 03/03/23  Yes Rolan Ezra RAMAN, MD  cholecalciferol (VITAMIN D3) 25 MCG (1000 UNIT) tablet Take 1 tablet (25 mcg total) by mouth daily for Vitamin D deficiency 08/18/22  Yes   dapagliflozin  propanediol (FARXIGA ) 10 MG TABS tablet Take 1 tablet (10 mg total) by mouth in the morning for diabetes and heart failure 12/08/22  Yes   fluticasone  furoate-vilanterol (BREO ELLIPTA ) 100-25 MCG/ACT AEPB Inhale 1 puff into the lungs daily. 07/17/21  Yes [name], MD  potassium chloride  SA (KLOR-CON  M) 20 MEQ tablet Take 2 tablets (40 mEq total) by mouth daily. 03/03/23  Yes Rolan Ezra RAMAN, MD  sacubitril -valsartan  (ENTRESTO ) 49-51 MG Take 1 tablet by mouth  2 (two) times daily. 07/03/21  Yes Rolan Ezra RAMAN, MD  simvastatin  (ZOCOR ) 20 MG tablet Take 1 tablet (20 mg total) by mouth every evening for hyperlipidemia. 03/30/23  Yes   spironolactone  (ALDACTONE ) 25 MG tablet Take 1 tablet (25 mg total) by mouth daily. 01/01/22  Yes Rolan Ezra RAMAN, MD  torsemide  (DEMADEX ) 20 MG tablet Take 2 tablets (40 mg total) by mouth daily. NEEDS FOLLOW UP APPOINTMENT FOR MORE REFILLS 04/07/23  Yes Rolan Ezra RAMAN, MD  simvastatin  (ZOCOR ) 20 MG tablet Take 1 tablet (20 mg total) by mouth every evening for hyperlipidemia Patient not taking: Reported on 04/19/2023 11/10/22         Allergies    Metolazone     Review of Systems   Review of Systems  Respiratory:  Positive for shortness of breath.   All other systems reviewed and are negative.   Physical Exam Updated Vital Signs BP 112/63 (BP Location: Left Arm)   Pulse 73   Temp 98.3 F (36.8 C) (Oral)   Resp 18   Ht 5' 6 (1.676 m)   Wt 111.1 kg   SpO2 90%   BMI 39.54 kg/m  Physical Exam Vitals and nursing note reviewed.  Constitutional:      General: He is not in acute distress.    Appearance: Normal appearance.  HENT:     Head: Normocephalic and atraumatic.  Eyes:     General:  Right eye: No discharge.        Left eye: No discharge.  Cardiovascular:     Rate and Rhythm: Regular rhythm. Tachycardia present.     Heart sounds: No murmur heard.    No friction rub. No gallop.  Pulmonary:     Effort: Pulmonary effort is normal. Tachypnea present.     Breath sounds: Normal breath sounds.     Comments: Increased work of breathing, with somewhat diminished breath sounds, especially diminished in the left lung fields, with biphasic wheezing on the right.  He has a wet sounding cough. Abdominal:     General: Bowel sounds are normal.     Palpations: Abdomen is soft.  Skin:    General: Skin is warm and dry.     Capillary Refill: Capillary refill takes less than 2 seconds.  Neurological:      Mental Status: He is alert and oriented to person, place, and time.  Psychiatric:        Mood and Affect: Mood normal.        Behavior: Behavior normal.     ED Results / Procedures / Treatments   Labs (all labs ordered are listed, but only abnormal results are displayed) Labs Reviewed  BASIC METABOLIC PANEL - Abnormal; Notable for the following components:      Result Value   Chloride 97 (*)    Glucose, Bld 154 (*)    BUN 22 (*)    Calcium  8.0 (*)    All other components within normal limits  CBC - Abnormal; Notable for the following components:   MCV 100.2 (*)    Platelets 142 (*)    nRBC 0.3 (*)    All other components within normal limits  BRAIN NATRIURETIC PEPTIDE - Abnormal; Notable for the following components:   B Natriuretic Peptide 320.4 (*)    All other components within normal limits  MAGNESIUM  - Abnormal; Notable for the following components:   Magnesium  3.3 (*)    All other components within normal limits  COMPREHENSIVE METABOLIC PANEL - Abnormal; Notable for the following components:   Chloride 94 (*)    Glucose, Bld 256 (*)    BUN 23 (*)    Calcium  8.1 (*)    Albumin 3.4 (*)    All other components within normal limits  CBC - Abnormal; Notable for the following components:   MCV 102.7 (*)    Platelets 140 (*)    nRBC 0.3 (*)    All other components within normal limits  HEMOGLOBIN A1C - Abnormal; Notable for the following components:   Hgb A1c MFr Bld 7.2 (*)    All other components within normal limits  IRON AND TIBC - Abnormal; Notable for the following components:   Saturation Ratios 17 (*)    All other components within normal limits  RETICULOCYTES - Abnormal; Notable for the following components:   Immature Retic Fract 25.4 (*)    All other components within normal limits  GLUCOSE, CAPILLARY - Abnormal; Notable for the following components:   Glucose-Capillary 148 (*)    All other components within normal limits  GLUCOSE, CAPILLARY - Abnormal;  Notable for the following components:   Glucose-Capillary 143 (*)    All other components within normal limits  COMPREHENSIVE METABOLIC PANEL - Abnormal; Notable for the following components:   Chloride 93 (*)    CO2 36 (*)    Glucose, Bld 116 (*)    BUN 25 (*)    Calcium  8.3 (*)  Total Protein 6.3 (*)    Albumin 3.1 (*)    All other components within normal limits  GLUCOSE, CAPILLARY - Abnormal; Notable for the following components:   Glucose-Capillary 151 (*)    All other components within normal limits  GLUCOSE, CAPILLARY - Abnormal; Notable for the following components:   Glucose-Capillary 174 (*)    All other components within normal limits  GLUCOSE, CAPILLARY - Abnormal; Notable for the following components:   Glucose-Capillary 135 (*)    All other components within normal limits  GLUCOSE, CAPILLARY - Abnormal; Notable for the following components:   Glucose-Capillary 137 (*)    All other components within normal limits  BASIC METABOLIC PANEL - Abnormal; Notable for the following components:   Chloride 95 (*)    CO2 36 (*)    Glucose, Bld 116 (*)    BUN 22 (*)    Calcium  8.7 (*)    All other components within normal limits  GLUCOSE, CAPILLARY - Abnormal; Notable for the following components:   Glucose-Capillary 117 (*)    All other components within normal limits  GLUCOSE, CAPILLARY - Abnormal; Notable for the following components:   Glucose-Capillary 135 (*)    All other components within normal limits  GLUCOSE, CAPILLARY - Abnormal; Notable for the following components:   Glucose-Capillary 154 (*)    All other components within normal limits  I-STAT VENOUS BLOOD GAS, ED - Abnormal; Notable for the following components:   pCO2, Ven 65.2 (*)    pO2, Ven 136 (*)    Bicarbonate 36.2 (*)    TCO2 38 (*)    Acid-Base Excess 8.0 (*)    Calcium , Ion 1.02 (*)    All other components within normal limits  TROPONIN I (HIGH SENSITIVITY) - Abnormal; Notable for the  following components:   Troponin I (High Sensitivity) 34 (*)    All other components within normal limits  TROPONIN I (HIGH SENSITIVITY) - Abnormal; Notable for the following components:   Troponin I (High Sensitivity) 32 (*)    All other components within normal limits  RESP PANEL BY RT-PCR (RSV, FLU A&B, COVID)  RVPGX2  RESPIRATORY PANEL BY PCR  MRSA NEXT GEN BY PCR, NASAL  PROCALCITONIN  HIV ANTIBODY (ROUTINE TESTING W REFLEX)  PROCALCITONIN  VITAMIN B12  FOLATE  FERRITIN    EKG EKG Interpretation Date/Time:  Sunday April 19 2023 08:59:49 EST Ventricular Rate:  114 PR Interval:  146 QRS Duration:  160 QT Interval:  376 QTC Calculation: 518 R Axis:   -73  Text Interpretation: Sinus tachycardia Possible Left atrial enlargement Right bundle branch block Left anterior fascicular block Bifascicular block Left ventricular hypertrophy with repolarization abnormality ( R in aVL , Romhilt-Estes ) Possible Lateral infarct , age undetermined Abnormal ECG When compared with ECG of 04-Nov-2021 14:39, PREVIOUS ECG IS PRESENT No significant change since last tracing Confirmed by Haviland, Julie ([id number]) on 04/19/2023 9:35:42 AM  Radiology ECHOCARDIOGRAM COMPLETE Result Date: 04/20/2023    ECHOCARDIOGRAM REPORT   Patient Name:   Yakir N Jean Date of Exam: 04/20/2023 Medical Rec #:  [id number]       Height:       66.0 in Accession #:    [id number]      Weight:       24 8.1 lb Date of Birth:  1975-08-23       BSA:          2.192 m Patient Age:    47 years  BP:           116/87 mmHg Patient Gender: M               HR:           80 bpm. Exam Location:  Inpatient Procedure: 2D Echo, Cardiac Doppler, Color Doppler and Intracardiac            Opacification Agent Indications:    CHF  History:        Patient has prior history of Echocardiogram examinations, most                 recent 09/02/2021. CHF; Risk Factors:Sleep Apnea, Dyslipidemia                 and Hypertension.  Sonographer:    Carmelita Hartshorn  RDCS, FE, PE Referring Phys: [id number] MARSA NOVAK MELVIN IMPRESSIONS  1. No apical thrombus. Left ventricular ejection fraction, by estimation, is 35 to 40%. Left ventricular ejection fraction by 2D MOD biplane is 37.2 %. The left ventricle has moderately decreased function. The left ventricle demonstrates global hypokinesis. There is mild left ventricular hypertrophy. Left ventricular diastolic parameters are indeterminate.  2. Right ventricular systolic function is moderately reduced. The right ventricular size is mildly enlarged. There is moderately elevated pulmonary artery systolic pressure. The estimated right ventricular systolic pressure is 57.0 mmHg.  3. The mitral valve is grossly normal. No evidence of mitral valve regurgitation.  4. The aortic valve is tricuspid. Aortic valve regurgitation is not visualized.  5. The inferior vena cava is dilated in size with <50% respiratory variability, suggesting right atrial pressure of 15 mmHg. Comparison(s): Changes from prior study are noted. 09/02/2021: LVEF 35%, moderate RVE, normal RV function. FINDINGS  Left Ventricle: No apical thrombus. Left ventricular ejection fraction, by estimation, is 35 to 40%. Left ventricular ejection fraction by 2D MOD biplane is 37.2 %. The left ventricle has moderately decreased function. The left ventricle demonstrates global hypokinesis. The left ventricular internal cavity size was normal in size. There is mild left ventricular hypertrophy. Abnormal (paradoxical) septal motion, consistent with RV pacemaker. Left ventricular diastolic function could not be evaluated due to indeterminate diastolic function. Left ventricular diastolic parameters are indeterminate. Right Ventricle: The right ventricular size is mildly enlarged. No increase in right ventricular wall thickness. Right ventricular systolic function is moderately reduced. There is moderately elevated pulmonary artery systolic pressure. The tricuspid regurgitant velocity is  3.24 m/s, and with an assumed right atrial pressure of 15 mmHg, the estimated right ventricular systolic pressure is 57.0 mmHg. Left Atrium: Left atrial size was normal in size. Right Atrium: Right atrial size was normal in size. Pericardium: Trivial pericardial effusion is present. The pericardial effusion is circumferential. Mitral Valve: The mitral valve is grossly normal. No evidence of mitral valve regurgitation. Tricuspid Valve: The tricuspid valve is grossly normal. Tricuspid valve regurgitation is trivial. Aortic Valve: The aortic valve is tricuspid. Aortic valve regurgitation is not visualized. Aortic valve mean gradient measures 2.0 mmHg. Aortic valve peak gradient measures 3.5 mmHg. Aortic valve area, by VTI measures 3.70 cm. Pulmonic Valve: The pulmonic valve was grossly normal. Pulmonic valve regurgitation is trivial. Aorta: The aortic root and ascending aorta are structurally normal, with no evidence of dilitation. Venous: The inferior vena cava is dilated in size with less than 50% respiratory variability, suggesting right atrial pressure of 15 mmHg. IAS/Shunts: No atrial level shunt detected by color flow Doppler. Additional Comments: A device lead is visualized.  LEFT VENTRICLE  PLAX 2D                        Biplane EF (MOD) LVIDd:         5.40 cm         LV Biplane EF:   Left LVIDs:         4.90 cm                          ventricular LV PW:         1.10 cm                          ejection LV IVS:        1.10 cm                          fraction by LVOT diam:     2.30 cm                          2D MOD LV SV:         64                               biplane is LV SV Index:   29                               37.2 %. LVOT Area:     4.15 cm                                Diastology                                LV e' medial:    6.09 cm/s LV Volumes (MOD)               LV E/e' medial:  12.2 LV vol d, MOD    67.3 ml       LV e' lateral:   7.29 cm/s A2C:                           LV E/e' lateral:  10.2 LV vol d, MOD    121.0 ml A4C: LV vol s, MOD    41.0 ml A2C: LV vol s, MOD    76.4 ml A4C: LV SV MOD A2C:   26.3 ml LV SV MOD A4C:   121.0 ml LV SV MOD BP:    35.0 ml RIGHT VENTRICLE RV S prime:     5.98 cm/s TAPSE (M-mode): 1.5 cm LEFT ATRIUM           Index        RIGHT ATRIUM           Index LA Vol (A2C): 30.5 ml 13.92 ml/m  RA Area:     16.00 cm LA Vol (A4C): 42.3 ml 19.30 ml/m  RA Volume:   38.40 ml  17.52 ml/m  AORTIC VALVE AV Area (Vmax):    3.92 cm AV Area (Vmean):   3.78 cm AV Area (VTI):     3.70 cm  AV Vmax:           93.00 cm/s AV Vmean:          62.900 cm/s AV VTI:            0.174 m AV Peak Grad:      3.5 mmHg AV Mean Grad:      2.0 mmHg LVOT Vmax:         87.80 cm/s LVOT Vmean:        57.200 cm/s LVOT VTI:          0.155 m LVOT/AV VTI ratio: 0.89  AORTA Ao Root diam: 3.20 cm MITRAL VALVE               TRICUSPID VALVE MV Area (PHT): 5.75 cm    TR Peak grad:   42.0 mmHg MV Decel Time: 132 msec    TR Vmax:        324.00 cm/s MV E velocity: 74.40 cm/s MV A velocity: 70.60 cm/s  SHUNTS MV E/A ratio:  1.05        Systemic VTI:  0.16 m                            Systemic Diam: 2.30 cm Vinie Maxcy MD Electronically signed by Vinie Maxcy MD Signature Date/Time: 04/20/2023/4:57:10 PM    Final     Procedures .Critical Care  Performed by: Rosan Sherlean DEL, PA-C Authorized by: Rosan Sherlean DEL, PA-C   Critical care provider statement:    Critical care time (minutes):  35   Critical care was necessary to treat or prevent imminent or life-threatening deterioration of the following conditions:  Respiratory failure   Critical care was time spent personally by me on the following activities:  Development of treatment plan with patient or surrogate, discussions with consultants, evaluation of patient's response to treatment, examination of patient, ordering and review of laboratory studies, ordering and review of radiographic studies, ordering and performing treatments and  interventions, pulse oximetry, re-evaluation of patient's condition and review of old charts   Care discussed with: admitting provider       Medications Ordered in ED Medications  simvastatin  (ZOCOR ) tablet 20 mg (20 mg Oral Given 04/21/23 1731)  carvedilol  (COREG ) tablet 6.25 mg (6.25 mg Oral Given 04/22/23 0945)  dapagliflozin  propanediol (FARXIGA ) tablet 10 mg (10 mg Oral Given 04/22/23 0824)  fluticasone  furoate-vilanterol (BREO ELLIPTA ) 100-25 MCG/ACT 1 puff (1 puff Inhalation Given 04/22/23 1126)  albuterol  (PROVENTIL ) (2.5 MG/3ML) 0.083% nebulizer solution 3 mL (3 mLs Inhalation Given 04/19/23 2331)  enoxaparin  (LOVENOX ) injection 50 mg (50 mg Subcutaneous Given 04/21/23 2055)  furosemide  (LASIX ) injection 80 mg (80 mg Intravenous Given 04/22/23 0945)  sodium chloride  flush (NS) 0.9 % injection 3 mL (3 mLs Intravenous Given 04/22/23 0826)  acetaminophen  (TYLENOL ) tablet 650 mg (650 mg Oral Given 04/22/23 0824)    Or  acetaminophen  (TYLENOL ) suppository 650 mg ( Rectal See Alternative 04/22/23 0824)  polyethylene glycol (MIRALAX  / GLYCOLAX ) packet 17 g (has no administration in time range)  guaiFENesin -dextromethorphan  (ROBITUSSIN DM) 100-10 MG/5ML syrup 5 mL (5 mLs Oral Given 04/19/23 2345)  insulin  aspart (novoLOG ) injection 0-15 Units (3 Units Subcutaneous Given 04/22/23 1252)  insulin  glargine-yfgn (SEMGLEE ) injection 10 Units (10 Units Subcutaneous Given 04/22/23 0825)  spironolactone  (ALDACTONE ) tablet 25 mg (0 mg Oral Hold 04/22/23 0945)  perflutren  lipid microspheres (DEFINITY ) IV suspension (4 mLs Intravenous Given 04/20/23 1507)  ipratropium-albuterol  (DUONEB) 0.5-2.5 (3) MG/3ML  nebulizer solution 3 mL (3 mLs Nebulization Given 04/19/23 0956)  methylPREDNISolone  sodium succinate (SOLU-MEDROL ) 125 mg/2 mL injection 125 mg (125 mg Intravenous Given 04/19/23 1002)  magnesium  sulfate IVPB 2 g 50 mL (0 g Intravenous Stopped 04/19/23 1148)  furosemide  (LASIX ) injection 40 mg (40 mg Intravenous Given 04/19/23 1150)   cefTRIAXone  (ROCEPHIN ) 1 g in sodium chloride  0.9 % 100 mL IVPB (0 g Intravenous Stopped 04/19/23 1242)  azithromycin  (ZITHROMAX ) 500 mg in sodium chloride  0.9 % 250 mL IVPB (0 mg Intravenous Stopped 04/19/23 1404)    ED Course/ Medical Decision Making/ A&P                                 Medical Decision Making Amount and/or Complexity of Data Reviewed Labs: ordered. Radiology: ordered.  Risk Prescription drug management. Decision regarding hospitalization.   This patient is a 48 y.o. male who presents to the ED for concern of shortness of breath, this involves an extensive number of treatment options, and is a complaint that carries with it a high risk of complications and morbidity. The emergent differential diagnosis prior to evaluation includes, but is not limited to,  asthma exacerbation, COPD exacerbation, acute upper respiratory infection, acute bronchitis, chronic bronchitis, interstitial lung disease, ARDS, PE, pneumonia, atypical ACS, carbon monoxide poisoning, spontaneous pneumothorax, new CHF vs CHF exacerbation, versus other . This is not an exhaustive differential.   Past Medical History / Co-morbidities / Social History: heart failure, hypertension, asthma, obesity, without tobacco abuse  Additional history: Chart reviewed. Pertinent results include: Reviewed lab work, imaging from previous emergency department visits, left ventricular ejection fraction at echo from 2023: 35 to 40% with left ventricle hypokinesis  Physical Exam: Physical exam performed. The pertinent findings include: Patient with signs of fluid overload, 1-2+ pitting edema bilateral lower extremities.  He has diminished breath sounds throughout, left worse than right, with questionable wheezing, focal consolidation on the right.  He was quite hypoxic in triage, arrives in an extremis, SpO2 73%, respiratory rate 26, and tachycardic to 116.  After breathing treatment, oxygen therapy he has improvement of  tachycardia, respiratory rate, and oxygen saturation.  Mild diastolic hypertension now noted, BP 121/96.  Lab Tests: I ordered, and personally interpreted labs.  The pertinent results include: VBG without acidosis, some excess bicarb noted.  Initial troponin elevated at 34, suspect likely secondary to demand ischemia, tachycardia.  His BNP is elevated at 320.4.  CBC without leukocytosis, RVP is negative for COVID, flu, RSV.   Imaging Studies: I ordered imaging studies including plain chest x-ray. I independently visualized and interpreted imaging which showed cardiomegaly, diffuse congestion, but opacity noted in right upper lobe suspicious for pneumonia. I agree with the radiologist interpretation.   Cardiac Monitoring:  The patient was maintained on a cardiac monitor.  My attending physician Dr. Dean viewed and interpreted the cardiac monitored which showed an underlying rhythm of: Sinus tachycardia, no acute ST-T changes. I agree with this interpretation.   Medications: I ordered medication including Lasix  for heart failure exacerbation, he was initially stabilized with DuoNeb, Solu-Medrol , magnesium  which helped his work of breathing, wheezing on reassessment, administered Rocephin , azithromycin  for community-acquired pneumonia  Consultations Obtained: I requested consultation with the hospitalist, spoke with Dr. Seena , and after discussion of his clinical exam, lab work, and imaging findings they agreed to admission for acute hypoxic respiratory failure secondary to pneumonia with probable underlying CHF exacerbation as well.,  and discussed lab and imaging findings as well as pertinent plan - they recommend: Admission for respiratory failure secondary to CHF exacerbation, pneumonia as discussed above   Disposition: After consideration of the diagnostic results and the patients response to treatment, I feel that patient would benefit from admission  I discussed this case with my  attending physician Dr. Dean who cosigned this note including patient's presenting symptoms, physical exam, and planned diagnostics and interventions. Attending physician stated agreement with plan or made changes to plan which were implemented.    Final Clinical Impression(s) / ED Diagnoses Final diagnoses:  Community acquired pneumonia of right upper lobe of lung  Acute on chronic congestive heart failure, unspecified heart failure type [facility])    Rx / DC Orders ED Discharge Orders     None         Rosan Sherlean DEL, PA-C 04/22/23 1402    Dean Clarity, MD 04/22/23 1413

## 2023-04-20 ENCOUNTER — Observation Stay (HOSPITAL_COMMUNITY): Payer: 59

## 2023-04-20 ENCOUNTER — Encounter (HOSPITAL_COMMUNITY): Payer: Self-pay | Admitting: Internal Medicine

## 2023-04-20 DIAGNOSIS — J9601 Acute respiratory failure with hypoxia: Secondary | ICD-10-CM | POA: Diagnosis present

## 2023-04-20 DIAGNOSIS — Z8619 Personal history of other infectious and parasitic diseases: Secondary | ICD-10-CM | POA: Diagnosis not present

## 2023-04-20 DIAGNOSIS — Z87891 Personal history of nicotine dependence: Secondary | ICD-10-CM | POA: Diagnosis not present

## 2023-04-20 DIAGNOSIS — E669 Obesity, unspecified: Secondary | ICD-10-CM | POA: Diagnosis present

## 2023-04-20 DIAGNOSIS — Z888 Allergy status to other drugs, medicaments and biological substances status: Secondary | ICD-10-CM | POA: Diagnosis not present

## 2023-04-20 DIAGNOSIS — Z006 Encounter for examination for normal comparison and control in clinical research program: Secondary | ICD-10-CM

## 2023-04-20 DIAGNOSIS — D7589 Other specified diseases of blood and blood-forming organs: Secondary | ICD-10-CM | POA: Diagnosis present

## 2023-04-20 DIAGNOSIS — Z6839 Body mass index (BMI) 39.0-39.9, adult: Secondary | ICD-10-CM | POA: Diagnosis not present

## 2023-04-20 DIAGNOSIS — Z7951 Long term (current) use of inhaled steroids: Secondary | ICD-10-CM | POA: Diagnosis not present

## 2023-04-20 DIAGNOSIS — E785 Hyperlipidemia, unspecified: Secondary | ICD-10-CM | POA: Diagnosis present

## 2023-04-20 DIAGNOSIS — Z79899 Other long term (current) drug therapy: Secondary | ICD-10-CM | POA: Diagnosis not present

## 2023-04-20 DIAGNOSIS — E1165 Type 2 diabetes mellitus with hyperglycemia: Secondary | ICD-10-CM | POA: Diagnosis present

## 2023-04-20 DIAGNOSIS — Z95810 Presence of automatic (implantable) cardiac defibrillator: Secondary | ICD-10-CM | POA: Diagnosis not present

## 2023-04-20 DIAGNOSIS — I5023 Acute on chronic systolic (congestive) heart failure: Secondary | ICD-10-CM

## 2023-04-20 DIAGNOSIS — I110 Hypertensive heart disease with heart failure: Secondary | ICD-10-CM | POA: Diagnosis present

## 2023-04-20 DIAGNOSIS — G4733 Obstructive sleep apnea (adult) (pediatric): Secondary | ICD-10-CM | POA: Diagnosis present

## 2023-04-20 DIAGNOSIS — Z713 Dietary counseling and surveillance: Secondary | ICD-10-CM | POA: Diagnosis not present

## 2023-04-20 DIAGNOSIS — Z833 Family history of diabetes mellitus: Secondary | ICD-10-CM | POA: Diagnosis not present

## 2023-04-20 DIAGNOSIS — Y636 Underdosing and nonadministration of necessary drug, medicament or biological substance: Secondary | ICD-10-CM | POA: Diagnosis present

## 2023-04-20 DIAGNOSIS — I428 Other cardiomyopathies: Secondary | ICD-10-CM | POA: Diagnosis present

## 2023-04-20 DIAGNOSIS — D696 Thrombocytopenia, unspecified: Secondary | ICD-10-CM | POA: Diagnosis present

## 2023-04-20 DIAGNOSIS — Z1152 Encounter for screening for COVID-19: Secondary | ICD-10-CM | POA: Diagnosis not present

## 2023-04-20 LAB — COMPREHENSIVE METABOLIC PANEL
ALT: 34 U/L (ref 0–44)
AST: 23 U/L (ref 15–41)
Albumin: 3.4 g/dL — ABNORMAL LOW (ref 3.5–5.0)
Alkaline Phosphatase: 56 U/L (ref 38–126)
Anion gap: 13 (ref 5–15)
BUN: 23 mg/dL — ABNORMAL HIGH (ref 6–20)
CO2: 28 mmol/L (ref 22–32)
Calcium: 8.1 mg/dL — ABNORMAL LOW (ref 8.9–10.3)
Chloride: 94 mmol/L — ABNORMAL LOW (ref 98–111)
Creatinine, Ser: 1.1 mg/dL (ref 0.61–1.24)
GFR, Estimated: >60 mL/min (ref >60)
Glucose, Bld: 256 mg/dL — ABNORMAL HIGH (ref 70–99)
Potassium: 4.5 mmol/L (ref 3.5–5.1)
Sodium: 135 mmol/L (ref 135–145)
Total Bilirubin: 0.8 mg/dL (ref 0.0–1.2)
Total Protein: 6.8 g/dL (ref 6.5–8.1)

## 2023-04-20 LAB — ECHOCARDIOGRAM COMPLETE
AR max vel: 3.92 cm2
AV Area VTI: 3.7 cm2
AV Area mean vel: 3.78 cm2
AV Mean grad: 2 mm[Hg]
AV Peak grad: 3.5 mm[Hg]
Ao pk vel: 0.93 m/s
Area-P 1/2: 5.75 cm2
Calc EF: 37.2 %
Height: 66 in
S' Lateral: 4.9 cm
Single Plane A2C EF: 39.1 %
Single Plane A4C EF: 36.9 %
Weight: 3969.6 [oz_av]

## 2023-04-20 LAB — CBC
HCT: 45.8 % (ref 39.0–52.0)
Hemoglobin: 14.6 g/dL (ref 13.0–17.0)
MCH: 32.7 pg (ref 26.0–34.0)
MCHC: 31.9 g/dL (ref 30.0–36.0)
MCV: 102.7 fL — ABNORMAL HIGH (ref 80.0–100.0)
Platelets: 140 10*3/uL — ABNORMAL LOW (ref 150–400)
RBC: 4.46 MIL/uL (ref 4.22–5.81)
RDW: 14 % (ref 11.5–15.5)
WBC: 7.4 10*3/uL (ref 4.0–10.5)
nRBC: 0.3 % — ABNORMAL HIGH (ref 0.0–0.2)

## 2023-04-20 LAB — MRSA NEXT GEN BY PCR, NASAL: MRSA by PCR Next Gen: NOT DETECTED

## 2023-04-20 LAB — FERRITIN: Ferritin: 251 ng/mL (ref 24–336)

## 2023-04-20 LAB — PROCALCITONIN: Procalcitonin: 0.21 ng/mL

## 2023-04-20 LAB — IRON AND TIBC
Iron: 60 ug/dL (ref 45–182)
Saturation Ratios: 17 % — ABNORMAL LOW (ref 17.9–39.5)
TIBC: 364 ug/dL (ref 250–450)
UIBC: 304 ug/dL

## 2023-04-20 LAB — RETICULOCYTES
Immature Retic Fract: 25.4 % — ABNORMAL HIGH (ref 2.3–15.9)
RBC.: 4.37 MIL/uL (ref 4.22–5.81)
Retic Count, Absolute: 94.4 10*3/uL (ref 19.0–186.0)
Retic Ct Pct: 2.2 % (ref 0.4–3.1)

## 2023-04-20 LAB — VITAMIN B12: Vitamin B-12: 429 pg/mL (ref 180–914)

## 2023-04-20 LAB — GLUCOSE, CAPILLARY
Glucose-Capillary: 148 mg/dL — ABNORMAL HIGH (ref 70–99)
Glucose-Capillary: 143 mg/dL — ABNORMAL HIGH (ref 70–99)
Glucose-Capillary: 151 mg/dL — ABNORMAL HIGH (ref 70–99)

## 2023-04-20 LAB — FOLATE: Folate: 14.7 ng/mL (ref >5.9)

## 2023-04-20 MED ORDER — INSULIN ASPART 100 UNIT/ML IJ SOLN
0.0000 [IU] | Freq: Three times a day (TID) | INTRAMUSCULAR | Status: DC
  Administered 2023-04-20 – 2023-04-21 (×4): 2 [IU] via SUBCUTANEOUS
  Administered 2023-04-21: 3 [IU] via SUBCUTANEOUS
  Administered 2023-04-22: 2 [IU] via SUBCUTANEOUS
  Administered 2023-04-22 – 2023-04-23 (×2): 3 [IU] via SUBCUTANEOUS

## 2023-04-20 MED ORDER — INSULIN GLARGINE-YFGN 100 UNIT/ML ~~LOC~~ SOLN
10.0000 [IU] | Freq: Every day | SUBCUTANEOUS | Status: DC
  Administered 2023-04-20 – 2023-04-23 (×4): 10 [IU] via SUBCUTANEOUS
  Filled 2023-04-20 (×4): qty 0.1

## 2023-04-20 MED ORDER — SACUBITRIL-VALSARTAN 24-26 MG PO TABS
1.0000 | ORAL_TABLET | Freq: Two times a day (BID) | ORAL | Status: DC
Start: 2023-04-20 — End: 2023-04-22
  Administered 2023-04-20 – 2023-04-21 (×4): 1 via ORAL
  Filled 2023-04-20 (×5): qty 1

## 2023-04-20 MED ORDER — SPIRONOLACTONE 25 MG PO TABS
25.0000 mg | ORAL_TABLET | Freq: Every day | ORAL | Status: DC
Start: 2023-04-20 — End: 2023-04-23
  Administered 2023-04-20 – 2023-04-23 (×3): 25 mg via ORAL
  Filled 2023-04-20 (×4): qty 1

## 2023-04-20 MED ORDER — PERFLUTREN LIPID MICROSPHERE
1.0000 mL | INTRAVENOUS | Status: AC | PRN
  Administered 2023-04-20: 4 mL via INTRAVENOUS

## 2023-04-20 NOTE — Research (Signed)
 SITE: 050     Subject # [id number]    Subprotocol: A  Inclusion Criteria  Patients who meet all of the following criteria are eligible for enrollment as study participants:  Yes No  Age > 49 years old X   Eligible to wear Holter Study X    Exclusion Criteria  Patients who meet any of these criteria are not eligible for enrollment as study participants: Yes No  1. Receiving any mechanical (respiratory or circulatory) or renal support therapy at Screening or during Visit #1.  X  2.  Any other conditions that in the opinion of the investigators are likely to prevent compliance with the study protocol or pose a safety concern if the subject participates in the study.  X  3. Poor tolerance, namely susceptible to severe skin allergies from ECG adhesive patch application.  X   Protocol: REV H                                     Residential Zip code [address] (First 3 digits ONLY)                                             [facility] Informed Consent   Subject Name: Kevin Cardenas  Subject met inclusion and exclusion criteria.  The informed consent form, study requirements and expectations were reviewed with the subject. Subject had opportunity to read consent and questions and concerns were addressed prior to the signing of the consent form.  The subject verbalized understanding of the trial requirements.  The subject agreed to participate in the [facility] EF ACT trial and signed the informed consent at 15::08 on 20-Apr-2023.  The informed consent was obtained prior to performance of any protocol-specific procedures for the subject.  A copy of the signed informed consent was given to the subject and a copy was placed in the subject's medical record.   Jana Swartzlander L Casson Catena         No current facility-administered medications for this visit. No current outpatient medications on file.  Facility-Administered Medications Ordered in Other Visits:    acetaminophen  (TYLENOL ) tablet 650 mg, 650 mg, Oral, Q6H  PRN, 650 mg at 04/19/23 1404 **OR** acetaminophen  (TYLENOL ) suppository 650 mg, 650 mg, Rectal, Q6H PRN, Melvin, Alexander B, MD   albuterol  (PROVENTIL ) (2.5 MG/3ML) 0.083% nebulizer solution 3 mL, 3 mL, Inhalation, Q4H PRN, Melvin, Alexander B, MD, 3 mL at 04/19/23 2331   carvedilol  (COREG ) tablet 6.25 mg, 6.25 mg, Oral, BID WC, Melvin, Alexander B, MD, 6.25 mg at 04/20/23 9089   dapagliflozin  propanediol (FARXIGA ) tablet 10 mg, 10 mg, Oral, Daily, Melvin, Alexander B, MD, 10 mg at 04/20/23 0910   enoxaparin  (LOVENOX ) injection 50 mg, 50 mg, Subcutaneous, Q24H, Melvin, Alexander B, MD, 50 mg at 04/19/23 2147   fluticasone  furoate-vilanterol (BREO ELLIPTA ) 100-25 MCG/ACT 1 puff, 1 puff, Inhalation, Daily, Melvin, Alexander B, MD, 1 puff at 04/20/23 1222   furosemide  (LASIX ) injection 80 mg, 80 mg, Intravenous, BID, Melvin, Alexander B, MD, 80 mg at 04/20/23 0909   guaiFENesin -dextromethorphan  (ROBITUSSIN DM) 100-10 MG/5ML syrup 5 mL, 5 mL, Oral, Q4H PRN, Franky Redia LOISE, MD, 5 mL at 04/19/23 2345   insulin  aspart (novoLOG ) injection 0-15 Units, 0-15 Units, Subcutaneous, TID WC, Fairy Frames, MD, 2 Units at  04/20/23 1220   insulin  glargine-yfgn (SEMGLEE ) injection 10 Units, 10 Units, Subcutaneous, Daily, Fairy Frames, MD, 10 Units at 04/20/23 1220   perflutren  lipid microspheres (DEFINITY ) IV suspension, 1-10 mL, Intravenous, PRN, Melvin, Alexander B, MD, 4 mL at 04/20/23 1507   polyethylene glycol (MIRALAX  / GLYCOLAX ) packet 17 g, 17 g, Oral, Daily PRN, Melvin, Alexander B, MD   sacubitril -valsartan  (ENTRESTO ) 24-26 mg per tablet, 1 tablet, Oral, BID, Fairy Frames, MD, 1 tablet at 04/20/23 9080   simvastatin  (ZOCOR ) tablet 20 mg, 20 mg, Oral, QPM, Melvin, Alexander B, MD, 20 mg at 04/19/23 1744   sodium chloride  flush (NS) 0.9 % injection 3 mL, 3 mL, Intravenous, Q12H, Melvin, Alexander B, MD, 3 mL at 04/20/23 0910   spironolactone  (ALDACTONE ) tablet 25 mg, 25 mg, Oral, Daily, Joseph,  Preetha, MD, 25 mg at 04/20/23 1222

## 2023-04-20 NOTE — Plan of Care (Signed)
 Problem: Education: Goal: Ability to demonstrate management of disease process will improve 04/20/2023 0826 by Ponciano Wells DEL, RN Outcome: Progressing 04/20/2023 0824 by Ponciano Wells DEL, RN Outcome: Progressing Goal: Ability to verbalize understanding of medication therapies will improve 04/20/2023 0826 by Ponciano Wells DEL, RN Outcome: Progressing 04/20/2023 0824 by Ponciano Wells DEL, RN Outcome: Progressing Goal: Individualized Educational Video(s) 04/20/2023 0826 by Ponciano Wells DEL, RN Outcome: Progressing 04/20/2023 0824 by Ponciano Wells DEL, RN Outcome: Progressing   Problem: Activity: Goal: Capacity to carry out activities will improve 04/20/2023 0826 by Ponciano Wells DEL, RN Outcome: Progressing 04/20/2023 0824 by Ponciano Wells DEL, RN Outcome: Progressing   Problem: Cardiac: Goal: Ability to achieve and maintain adequate cardiopulmonary perfusion will improve 04/20/2023 0826 by Ponciano Wells DEL, RN Outcome: Progressing 04/20/2023 0824 by Ponciano Wells DEL, RN Outcome: Progressing   Problem: Education: Goal: Knowledge of General Education information will improve Description: Including pain rating scale, medication(s)/side effects and non-pharmacologic comfort measures 04/20/2023 0826 by Ponciano Wells DEL, RN Outcome: Progressing 04/20/2023 0824 by Ponciano Wells DEL, RN Outcome: Progressing   Problem: Health Behavior/Discharge Planning: Goal: Ability to manage health-related needs will improve 04/20/2023 0826 by Ponciano Wells DEL, RN Outcome: Progressing 04/20/2023 0824 by Ponciano Wells DEL, RN Outcome: Progressing   Problem: Clinical Measurements: Goal: Ability to maintain clinical measurements within normal limits will improve 04/20/2023 0826 by Ponciano Wells DEL, RN Outcome: Progressing 04/20/2023 0824 by Ponciano Wells DEL, RN Outcome: Progressing Goal: Will remain free from infection 04/20/2023 0826 by Ponciano Wells DEL, RN Outcome: Progressing 04/20/2023 0824 by Ponciano Wells DEL, RN Outcome: Progressing Goal: Diagnostic test results will improve 04/20/2023 0826 by Ponciano Wells DEL, RN Outcome: Progressing 04/20/2023 0824 by Ponciano Wells DEL, RN Outcome: Progressing Goal: Respiratory complications will improve 04/20/2023 0826 by Ponciano Wells DEL, RN Outcome: Progressing 04/20/2023 0824 by Ponciano Wells DEL, RN Outcome: Progressing Goal: Cardiovascular complication will be avoided 04/20/2023 0826 by Ponciano Wells DEL, RN Outcome: Progressing 04/20/2023 0824 by Ponciano Wells DEL, RN Outcome: Progressing   Problem: Activity: Goal: Risk for activity intolerance will decrease 04/20/2023 0826 by Ponciano Wells DEL, RN Outcome: Progressing 04/20/2023 0824 by Ponciano Wells DEL, RN Outcome: Progressing   Problem: Nutrition: Goal: Adequate nutrition will be maintained 04/20/2023 0826 by Ponciano Wells DEL, RN Outcome: Progressing 04/20/2023 0824 by Ponciano Wells DEL, RN Outcome: Progressing   Problem: Coping: Goal: Level of anxiety will decrease 04/20/2023 0826 by Ponciano Wells DEL, RN Outcome: Progressing 04/20/2023 0824 by Ponciano Wells DEL, RN Outcome: Progressing   Problem: Elimination: Goal: Will not experience complications related to bowel motility 04/20/2023 0826 by Ponciano Wells DEL, RN Outcome: Progressing 04/20/2023 0824 by Ponciano Wells DEL, RN Outcome: Progressing Goal: Will not experience complications related to urinary retention 04/20/2023 0826 by Ponciano Wells DEL, RN Outcome: Progressing 04/20/2023 0824 by Ponciano Wells DEL, RN Outcome: Progressing   Problem: Pain Management: Goal: General experience of comfort will improve 04/20/2023 0826 by Ponciano Wells DEL, RN Outcome: Progressing 04/20/2023 0824 by Ponciano Wells DEL, RN Outcome: Progressing   Problem: Safety: Goal: Ability to remain free from injury will improve 04/20/2023 0826 by Ponciano Wells DEL, RN Outcome: Progressing 04/20/2023 0824 by Ponciano Wells DEL, RN Outcome: Progressing   Problem:  Skin Integrity: Goal: Risk for impaired skin integrity will decrease 04/20/2023 0826 by Ponciano Wells DEL, RN Outcome: Progressing 04/20/2023 0824 by Ponciano Wells DEL, RN Outcome: Progressing   Problem: Activity: Goal: Ability to tolerate increased activity will improve 04/20/2023 0826 by Ponciano Wells DEL,  RN Outcome: Progressing 04/20/2023 0824 by Ponciano Wells DEL, RN Outcome: Progressing   Problem: Clinical Measurements: Goal: Ability to maintain a body temperature in the normal range will improve 04/20/2023 0826 by Ponciano Wells DEL, RN Outcome: Progressing 04/20/2023 0824 by Ponciano Wells DEL, RN Outcome: Progressing   Problem: Respiratory: Goal: Ability to maintain adequate ventilation will improve 04/20/2023 0826 by Ponciano Wells DEL, RN Outcome: Progressing 04/20/2023 0824 by Ponciano Wells DEL, RN Outcome: Progressing Goal: Ability to maintain a clear airway will improve 04/20/2023 0826 by Ponciano Wells DEL, RN Outcome: Progressing 04/20/2023 0824 by Ponciano Wells DEL, RN Outcome: Progressing

## 2023-04-20 NOTE — Progress Notes (Signed)
 PROGRESS NOTE    Kevin Cardenas  FMW:[id number] DOB: 11/19/1975 DOA: 04/19/2023 PCP: [name], MD  47/M with chronic systolic CHF, NICM, obesity, OSA, hypertension, dyslipidemia, asthma presented to the ED with worsening shortness of breath and edema X few days associated with chest tightness. -Has not seen cardiology in over a year, taking some of his heart failure medicines. -In the ED he was hypoxic, troponin 34, creatinine 1.2 BNP 320, chest x-ray with?  Right upper lobe opacity, cardiomegaly -Admitted, started on diuretics and broad-spectrum antibiotics  Subjective: -Feels a little better, breathing is improving  Assessment and Plan:  Acute on chronic systolic CHF -Last echo 2023 with EF 35-40%, indeterminate diastolic function, normal RV, severely dilated right ventricle -Appears volume overloaded, has been out of some heart failure meds -Continue IV Lasix  80 Mg twice daily, -Continue Coreg , Farxiga  -Restart Aldactone  and Entresto  -Dietitian consult -Will send med[facility], at home health RN at discharge, has follow-up with Dr. Rolan this month  History of MSSA bacteremia with endocarditis -In 08/2021, ICD extracted then, completed course of cefazolin   Acute respiratory failure with hypoxia -As above, wean O2  Morbid obesity OSA -Resume CPAP -Needs diet and lifestyle modification  Hyperglycemia -Denies history of diabetes, check HbA1c  Macrocytosis with mild thrombocytopenia -Check anemia panel, B12/folate  DVT prophylaxis: Lovenox  Code Status: Full code Family Communication: None present Disposition Plan:   Consultants:    Procedures:   Antimicrobials:    Objective: Vitals:   04/19/23 1827 04/19/23 2041 04/20/23 0429 04/20/23 0905  BP: 112/82 111/75 111/84 116/87  Pulse: 78 85 92 89  Resp: 18 19 (!) 23 20  Temp: 98.3 F (36.8 C) 98.3 F (36.8 C) 98.7 F (37.1 C) 98.4 F (36.9 C)  TempSrc: Oral Oral Oral Oral  SpO2: 96% (!) 89% 91% 96%   Weight: 109.3 kg  112.5 kg   Height: 5' 6 (1.676 m)       Intake/Output Summary (Last 24 hours) at 04/20/2023 1024 Last data filed at 04/19/2023 2030 Gross per 24 hour  Intake --  Output 1100 ml  Net -1100 ml   Filed Weights   04/19/23 0858 04/19/23 1827 04/20/23 0429  Weight: 104.3 kg 109.3 kg 112.5 kg    Examination:  General exam: Obese male sitting up in bed, AAOx3 HEENT: Positive JVD CVS: S1-S2, regular rhythm Lungs: Poor air movement bilaterally, decreased breath sounds to bases Abdomen: Soft, nontender, bowel sounds present Extremities: 1+ edema Skin: No rashes Psychiatry: Poor insight    Data Reviewed:   CBC: Recent Labs  Lab 04/19/23 0902 04/19/23 1019 04/20/23 0308  WBC 7.3  --  7.4  HGB 14.0 14.6 14.6  HCT 43.2 43.0 45.8  MCV 100.2*  --  102.7*  PLT 142*  --  140*   Basic Metabolic Panel: Recent Labs  Lab 04/19/23 0902 04/19/23 1019 04/19/23 1143 04/20/23 0308  NA 138 138  --  135  K 3.9 4.0  --  4.5  CL 97*  --   --  94*  CO2 28  --   --  28  GLUCOSE 154*  --   --  256*  BUN 22*  --   --  23*  CREATININE 1.23  --   --  1.10  CALCIUM  8.0*  --   --  8.1*  MG  --   --  3.3*  --    GFR: Estimated Creatinine Clearance: 96.8 mL/min (by C-G formula based on SCr of 1.1 mg/dL).  Liver Function Tests: Recent Labs  Lab 04/20/23 0308  AST 23  ALT 34  ALKPHOS 56  BILITOT 0.8  PROT 6.8  ALBUMIN 3.4*   No results for input(s): LIPASE, AMYLASE in the last 168 hours. No results for input(s): AMMONIA in the last 168 hours. Coagulation Profile: No results for input(s): INR, PROTIME in the last 168 hours. Cardiac Enzymes: No results for input(s): CKTOTAL, CKMB, CKMBINDEX, TROPONINI in the last 168 hours. BNP (last 3 results) No results for input(s): PROBNP in the last 8760 hours. HbA1C: No results for input(s): HGBA1C in the last 72 hours. CBG: No results for input(s): GLUCAP in the last 168 hours. Lipid Profile: No  results for input(s): CHOL, HDL, LDLCALC, TRIG, CHOLHDL, LDLDIRECT in the last 72 hours. Thyroid  Function Tests: No results for input(s): TSH, T4TOTAL, FREET4, T3FREE, THYROIDAB in the last 72 hours. Anemia Panel: No results for input(s): VITAMINB12, FOLATE, FERRITIN, TIBC, IRON, RETICCTPCT in the last 72 hours. Urine analysis:    Component Value Date/Time   COLORURINE AMBER (A) 09/01/2021 1324   APPEARANCEUR HAZY (A) 09/01/2021 1324   LABSPEC 1.018 09/01/2021 1324   PHURINE 5.0 09/01/2021 1324   GLUCOSEU NEGATIVE 09/01/2021 1324   GLUCOSEU NEGATIVE 01/15/2016 1600   HGBUR NEGATIVE 09/01/2021 1324   BILIRUBINUR NEGATIVE 09/01/2021 1324   KETONESUR 5 (A) 09/01/2021 1324   PROTEINUR 30 (A) 09/01/2021 1324   UROBILINOGEN 0.2 01/15/2016 1600   NITRITE NEGATIVE 09/01/2021 1324   LEUKOCYTESUR NEGATIVE 09/01/2021 1324   Sepsis Labs: @LABRCNTIP (procalcitonin:4,lacticidven:4)  ) Recent Results (from the past 240 hours)  Resp panel by RT-PCR (RSV, Flu A&B, Covid) Anterior Nasal Swab     Status: None   Collection Time: 04/19/23  9:41 AM   Specimen: Anterior Nasal Swab  Result Value Ref Range Status   SARS Coronavirus 2 by RT PCR NEGATIVE NEGATIVE Final   Influenza A by PCR NEGATIVE NEGATIVE Final   Influenza B by PCR NEGATIVE NEGATIVE Final    Comment: (NOTE) The Xpert Xpress SARS-CoV-2/FLU/RSV plus assay is intended as an aid in the diagnosis of influenza from Nasopharyngeal swab specimens and should not be used as a sole basis for treatment. Nasal washings and aspirates are unacceptable for Xpert Xpress SARS-CoV-2/FLU/RSV testing.  Fact Sheet for Patients: bloggercourse.com  Fact Sheet for Healthcare Providers: seriousbroker.it  This test is not yet approved or cleared by the [place]  FDA and has been authorized for detection and/or diagnosis of SARS-CoV-2 by FDA under an Emergency Use  Authorization (EUA). This EUA will remain in effect (meaning this test can be used) for the duration of the COVID-19 declaration under Section 564(b)(1) of the Act, 21 U.S.C. section 360bbb-3(b)(1), unless the authorization is terminated or revoked.     Resp Syncytial Virus by PCR NEGATIVE NEGATIVE Final    Comment: (NOTE) Fact Sheet for Patients: bloggercourse.com  Fact Sheet for Healthcare Providers: seriousbroker.it  This test is not yet approved or cleared by the [place]  FDA and has been authorized for detection and/or diagnosis of SARS-CoV-2 by FDA under an Emergency Use Authorization (EUA). This EUA will remain in effect (meaning this test can be used) for the duration of the COVID-19 declaration under Section 564(b)(1) of the Act, 21 U.S.C. section 360bbb-3(b)(1), unless the authorization is terminated or revoked.  Performed at [facility], [address] [place], [place] [address]   Respiratory (~20 pathogens) panel by PCR     Status: None   Collection Time: 04/19/23 12:38 PM  Specimen: Nasopharyngeal Swab; Respiratory  Result Value Ref Range Status   Adenovirus NOT DETECTED NOT DETECTED Final   Coronavirus 229E NOT DETECTED NOT DETECTED Final    Comment: (NOTE) The Coronavirus on the Respiratory Panel, DOES NOT test for the novel  Coronavirus (2019 nCoV)    Coronavirus HKU1 NOT DETECTED NOT DETECTED Final   Coronavirus NL63 NOT DETECTED NOT DETECTED Final   Coronavirus OC43 NOT DETECTED NOT DETECTED Final   Metapneumovirus NOT DETECTED NOT DETECTED Final   Rhinovirus / Enterovirus NOT DETECTED NOT DETECTED Final   Influenza A NOT DETECTED NOT DETECTED Final   Influenza B NOT DETECTED NOT DETECTED Final   Parainfluenza Virus 1 NOT DETECTED NOT DETECTED Final   Parainfluenza Virus 2 NOT DETECTED NOT DETECTED Final   Parainfluenza Virus 3 NOT DETECTED NOT DETECTED Final   Parainfluenza Virus 4 NOT  DETECTED NOT DETECTED Final   Respiratory Syncytial Virus NOT DETECTED NOT DETECTED Final   Bordetella pertussis NOT DETECTED NOT DETECTED Final   Bordetella Parapertussis NOT DETECTED NOT DETECTED Final   Chlamydophila pneumoniae NOT DETECTED NOT DETECTED Final   Mycoplasma pneumoniae NOT DETECTED NOT DETECTED Final    Comment: Performed at [facility], [address], [place], [place] [address]     Radiology Studies: DG Chest 2 View Result Date: 04/19/2023 CLINICAL DATA:  Shortness of breath and chest pain EXAM: CHEST - 2 VIEW COMPARISON:  09/02/2021 FINDINGS: Airspace disease in the right upper lobe best seen on the frontal view. Chronic cardiomegaly with single chamber pacer on the left no longer seen. No visible effusion or cavitation. No pneumothorax. IMPRESSION: 1. Right upper lobe opacity, usually pneumonia. Recommend follow-up to clearing. 2. Cardiomegaly. Electronically Signed   By: Dorn Roulette M.D.   On: 04/19/2023 11:08     Scheduled Meds:  carvedilol   6.25 mg Oral BID WC   dapagliflozin  propanediol  10 mg Oral Daily   enoxaparin  (LOVENOX ) injection  50 mg Subcutaneous Q24H   fluticasone  furoate-vilanterol  1 puff Inhalation Daily   furosemide   80 mg Intravenous BID   insulin  aspart  0-15 Units Subcutaneous TID WC   insulin  glargine-yfgn  10 Units Subcutaneous Daily   sacubitril -valsartan   1 tablet Oral BID   simvastatin   20 mg Oral QPM   sodium chloride  flush  3 mL Intravenous Q12H   Continuous Infusions:   LOS: 0 days    Time spent:    Sigurd Pac, MD [facility]   04/20/2023, 10:24 AM

## 2023-04-20 NOTE — Progress Notes (Signed)
 Pt says he wears CPAP at home but he does not want to wear one while he is here. Pt knows to notify RN to call RT if he changes his mind.    04/20/23 2010  BiPAP/CPAP/SIPAP  Reason BIPAP/CPAP not in use Non-compliant  BiPAP/CPAP /SiPAP Vitals  Temp 98.5 F (36.9 C)  Pulse Rate 91  Resp 18  BP 105/75  SpO2 90 %  MEWS Score/Color  MEWS Score 0  MEWS Score Color Kevin Cardenas

## 2023-04-20 NOTE — TOC Initial Note (Signed)
 Transition of Care [facility]) - Initial/Assessment Note    Patient Details  Name: Kevin Cardenas MRN: [id number] Date of Birth: June 29, 1975  [facility] CM/SW Contact:    Sudie Erminio Deems, RN Phone Number: 04/20/2023, 12:55 PM  Clinical Narrative: Patient presented for shortness of breath. PTA patient states he was from home with spouse and plans to return home. Patient reports that he has DME CPAP and nebulizer in the home. Patient has PCP and gets to appointments without any issues. Case Manager will continue to follow for transition of care needs as the patient progresses.                  Expected Discharge Plan: Home/Self Care Barriers to Discharge: Continued Medical Work up   Patient Goals and CMS Choice Patient states their goals for this hospitalization and ongoing recovery are:: to retun home with spouse   Expected Discharge Plan and Services     Post Acute Care Choice: NA Living arrangements for the past 2 months: Single Family Home                   DME Agency: NA  Prior Living Arrangements/Services Living arrangements for the past 2 months: Single Family Home Lives with:: Spouse Patient language and need for interpreter reviewed:: Yes Do you feel safe going back to the place where you live?: Yes      Need for Family Participation in Patient Care: Yes (Comment) Care giver support system in place?: Yes (comment)   Criminal Activity/Legal Involvement Pertinent to Current Situation/Hospitalization: No - Comment as needed  Activities of Daily Living   ADL Screening (condition at time of admission) Independently performs ADLs?: Yes (appropriate for developmental age) Is the patient deaf or have difficulty hearing?: No Does the patient have difficulty seeing, even when wearing glasses/contacts?: No Does the patient have difficulty concentrating, remembering, or making decisions?: No  Permission Sought/Granted Permission sought to share information  with : Family Supports, Case Manager Permission granted to share information with : Yes, Verbal Permission Granted   Emotional Assessment Appearance:: Appears stated age Attitude/Demeanor/Rapport: Engaged Affect (typically observed): Appropriate Orientation: : Oriented to Self, Oriented to Place, Oriented to  Time, Oriented to Situation Alcohol / Substance Use: Not Applicable Psych Involvement: No (comment)  Admission diagnosis:  Acute respiratory failure with hypoxia (HCC) [J96.01] Community acquired pneumonia of right upper lobe of lung [J18.9] Acute on chronic congestive heart failure, unspecified heart failure type (HCC) [I50.9] Patient Active Problem List   Diagnosis Date Noted   Acute respiratory failure with hypoxia (HCC) 04/19/2023   PNA (pneumonia) 04/19/2023   Acute on chronic systolic CHF (congestive heart failure) (HCC) 04/19/2023   Hypotension 09/20/2021   Aicd lead infection (HCC) 09/19/2021   HFrEF (heart failure with reduced ejection fraction) (HCC) 11/06/2019   AKI (acute kidney injury) (HCC) 11/05/2019   Obese 01/16/2016   Asthma, stable, mild intermittent 01/15/2016   OSA on CPAP 06/21/2014   Routine general medical examination at a health care facility 04/18/2014   Hyperlipidemia with target LDL less than 100 04/17/2014   Non-ischemic cardiomyopathy (HCC) 02/06/2011   Systolic CHF, chronic (HCC) 02/06/2011   VSD (ventricular septal defect) 02/06/2011   HTN (hypertension) 02/06/2011   Prediabetes 01/30/2011   PCP:  [name], MD Pharmacy:   [facility] [address] [place] [address] Phone: [phone] Fax: [phone]  [facility] [address] [place]  [address] Phone: [phone] Fax: [phone]  Social Drivers of Health (SDOH) Social History: SDOH Screenings   Food Insecurity: No Food Insecurity (04/19/2023)  Housing: Low Risk  (04/19/2023)  Transportation  Needs: No Transportation Needs (04/19/2023)  Utilities: Not At Risk (04/19/2023)  Alcohol Screen: Low Risk  (12/04/2022)  Depression (PHQ2-9): Low Risk  (12/04/2022)  Financial Resource Strain: Low Risk  (12/04/2022)  Physical Activity: Sufficiently Active (12/04/2022)  Social Connections: Moderately Integrated (12/04/2022)  Stress: No Stress Concern Present (12/04/2022)  Tobacco Use: Medium Risk (04/20/2023)  Health Literacy: Adequate Health Literacy (12/04/2022)   Readmission Risk Interventions     No data to display

## 2023-04-20 NOTE — Progress Notes (Signed)
 Heart Failure Navigator Progress Note  Assessed for Heart & Vascular TOC clinic readiness.  Patient does not meet criteria due to Advanced Heart Failure team patient of Dr. Shirlee Latch..   Navigator will sign off at this time.    Rhae Hammock, BSN, Scientist, clinical (histocompatibility and immunogenetics) Only

## 2023-04-21 DIAGNOSIS — J9601 Acute respiratory failure with hypoxia: Secondary | ICD-10-CM | POA: Diagnosis not present

## 2023-04-21 LAB — COMPREHENSIVE METABOLIC PANEL
ALT: 33 U/L (ref 0–44)
AST: 24 U/L (ref 15–41)
Albumin: 3.1 g/dL — ABNORMAL LOW (ref 3.5–5.0)
Alkaline Phosphatase: 52 U/L (ref 38–126)
Anion gap: 11 (ref 5–15)
BUN: 25 mg/dL — ABNORMAL HIGH (ref 6–20)
CO2: 36 mmol/L — ABNORMAL HIGH (ref 22–32)
Calcium: 8.3 mg/dL — ABNORMAL LOW (ref 8.9–10.3)
Chloride: 93 mmol/L — ABNORMAL LOW (ref 98–111)
Creatinine, Ser: 1.11 mg/dL (ref 0.61–1.24)
GFR, Estimated: >60 mL/min (ref >60)
Glucose, Bld: 116 mg/dL — ABNORMAL HIGH (ref 70–99)
Potassium: 3.7 mmol/L (ref 3.5–5.1)
Sodium: 140 mmol/L (ref 135–145)
Total Bilirubin: 0.7 mg/dL (ref 0.0–1.2)
Total Protein: 6.3 g/dL — ABNORMAL LOW (ref 6.5–8.1)

## 2023-04-21 LAB — HEMOGLOBIN A1C
Hgb A1c MFr Bld: 7.2 % — ABNORMAL HIGH (ref 4.8–5.6)
Mean Plasma Glucose: 160 mg/dL

## 2023-04-21 LAB — GLUCOSE, CAPILLARY
Glucose-Capillary: 174 mg/dL — ABNORMAL HIGH (ref 70–99)
Glucose-Capillary: 135 mg/dL — ABNORMAL HIGH (ref 70–99)
Glucose-Capillary: 137 mg/dL — ABNORMAL HIGH (ref 70–99)
Glucose-Capillary: 117 mg/dL — ABNORMAL HIGH (ref 70–99)

## 2023-04-21 NOTE — Plan of Care (Signed)
  Problem: Cardiac: Goal: Ability to achieve and maintain adequate cardiopulmonary perfusion will improve Outcome: Progressing   Problem: Clinical Measurements: Goal: Respiratory complications will improve Outcome: Progressing Goal: Cardiovascular complication will be avoided Outcome: Progressing   Problem: Safety: Goal: Ability to remain free from injury will improve Outcome: Progressing   Problem: Respiratory: Goal: Ability to maintain adequate ventilation will improve Outcome: Progressing Goal: Ability to maintain a clear airway will improve Outcome: Progressing   Problem: Metabolic: Goal: Ability to maintain appropriate glucose levels will improve Outcome: Progressing

## 2023-04-21 NOTE — Progress Notes (Signed)
 Nutrition Education Note  RD consulted for nutrition education regarding a Heart Healthy diet.   Lipid Panel     Component Value Date/Time   CHOL 128 04/17/2021 1047   TRIG 45 04/17/2021 1047   HDL 43 04/17/2021 1047   CHOLHDL 3.0 04/17/2021 1047   CHOLHDL 3.5 06/15/2018 1116   VLDL 19 06/15/2018 1116   LDLCALC 74 04/17/2021 1047    RD provided Heart Healthy/Consistent Carbohydrate Nutrition Therapy handout from the Academy of [employer]. Reviewed patient's dietary recall. Provided examples on ways to decrease sodium and fat intake in diet. Discouraged intake of processed foods and use of salt shaker. Encouraged fresh fruits and vegetables as well as whole grain sources of carbohydrates to maximize fiber intake.  Also discussed fluid restricted and current order of 1500 mL, what to count towards fluid allowance. Written education form [employer] given for reference.  Expect  compliance. Patient with baseline knowledge on most of the education. He reads labels and works on his diet at home.  Body mass index is 39.25 kg/m. Pt meets criteria for obesity based on current BMI.  Current diet order is heart healthy, patient is consuming approximately 100% x 1 meal recorded at this time. Labs and medications reviewed. No further nutrition interventions warranted at this time. RD contact information provided. If additional nutrition issues arise, please re-consult RD.  Kevin Cardenas, RDLD Clinical Dietitian If unable to reach, please contact RD Inpatient secure chat group between 8 am-4 pm daily

## 2023-04-21 NOTE — Progress Notes (Signed)
 PROGRESS NOTE    Kevin Cardenas  FMW:[id number] DOB: 1975-08-14 DOA: 04/19/2023 PCP: [name], MD  48/M with chronic systolic CHF, NICM, obesity, OSA, hypertension, dyslipidemia, asthma presented to the ED with worsening shortness of breath and edema X few days associated with chest tightness. -Has not seen cardiology in over a year, taking some of his heart failure medicines. -In the ED he was hypoxic, troponin 34, creatinine 1.2 BNP 320, chest x-ray with?  Right upper lobe opacity, cardiomegaly -Admitted, started on diuretics and broad-spectrum antibiotics  Subjective: -Feels better overall, breathing improving, still on oxygen, wants to go home  Assessment and Plan:  Acute on chronic systolic CHF -Last echo 2023 with EF 35-40%, indeterminate diastolic function, normal RV, severely dilated right ventricle -Appears volume overloaded, has been out of some heart failure meds -Improving, urine output inaccurate, only 2 L negative documented, continue Lasix  80 Mg twice daily, Coreg , Farxiga , Entresto  and Aldactone  -Repeat echo with EF 35-40%, moderately reduced RV and moderately elevated PASP -Dietitian consulted -Will send meds to [facility], at home health RN at discharge, has follow-up with Dr. Rolan this month -Wean O2  History of MSSA bacteremia with endocarditis -In 08/2021, ICD extracted then, completed course of cefazolin   Acute respiratory failure with hypoxia -As above, wean O2  Morbid obesity OSA -Resume CPAP -Needs diet and lifestyle modification  Hyperglycemia, diabetes mellitus -A1c is 7.2 -Continue Farxiga   Macrocytosis with mild thrombocytopenia -Check anemia panel, B12/folate  DVT prophylaxis: Lovenox  Code Status: Full code Family Communication: None present Disposition Plan:   Consultants:    Procedures:   Antimicrobials:    Objective: Vitals:   04/20/23 2014 04/21/23 0042 04/21/23 0500 04/21/23 0918  BP: 105/75 103/77    Pulse: 95 87  97   Resp:  18  17  Temp:   98 F (36.7 C)   TempSrc:   Oral   SpO2: 92%     Weight:   110.3 kg   Height:        Intake/Output Summary (Last 24 hours) at 04/21/2023 1224 Last data filed at 04/21/2023 1025 Gross per 24 hour  Intake 480 ml  Output 1645 ml  Net -1165 ml   Filed Weights   04/19/23 1827 04/20/23 0429 04/21/23 0500  Weight: 109.3 kg 112.5 kg 110.3 kg    Examination:  Gen: Awake, Alert, Oriented X 3, obese chronically ill HEENT: + JVD Lungs: Good air movement bilaterally, CTAB CVS: S1S2/RRR Abd: soft, Non tender, non distended, BS present Extremities: 1+ edema  skin: no new rashes on exposed skin  Psychiatry: Poor insight    Data Reviewed:   CBC: Recent Labs  Lab 04/19/23 0902 04/19/23 1019 04/20/23 0308  WBC 7.3  --  7.4  HGB 14.0 14.6 14.6  HCT 43.2 43.0 45.8  MCV 100.2*  --  102.7*  PLT 142*  --  140*   Basic Metabolic Panel: Recent Labs  Lab 04/19/23 0902 04/19/23 1019 04/19/23 1143 04/20/23 0308 04/21/23 0437  NA 138 138  --  135 140  K 3.9 4.0  --  4.5 3.7  CL 97*  --   --  94* 93*  CO2 28  --   --  28 36*  GLUCOSE 154*  --   --  256* 116*  BUN 22*  --   --  23* 25*  CREATININE 1.23  --   --  1.10 1.11  CALCIUM  8.0*  --   --  8.1* 8.3*  MG  --   --  3.3*  --   --    GFR: Estimated Creatinine Clearance: 94.9 mL/min (by C-G formula based on SCr of 1.11 mg/dL). Liver Function Tests: Recent Labs  Lab 04/20/23 0308 04/21/23 0437  AST 23 24  ALT 34 33  ALKPHOS 56 52  BILITOT 0.8 0.7  PROT 6.8 6.3*  ALBUMIN 3.4* 3.1*   No results for input(s): LIPASE, AMYLASE in the last 168 hours. No results for input(s): AMMONIA in the last 168 hours. Coagulation Profile: No results for input(s): INR, PROTIME in the last 168 hours. Cardiac Enzymes: No results for input(s): CKTOTAL, CKMB, CKMBINDEX, TROPONINI in the last 168 hours. BNP (last 3 results) No results for input(s): PROBNP in the last 8760 hours. HbA1C: Recent  Labs    04/20/23 0757  HGBA1C 7.2*   CBG: Recent Labs  Lab 04/20/23 1148 04/20/23 1643 04/20/23 2131 04/21/23 0756 04/21/23 1158  GLUCAP 148* 143* 151* 174* 135*   Lipid Profile: No results for input(s): CHOL, HDL, LDLCALC, TRIG, CHOLHDL, LDLDIRECT in the last 72 hours. Thyroid  Function Tests: No results for input(s): TSH, T4TOTAL, FREET4, T3FREE, THYROIDAB in the last 72 hours. Anemia Panel: Recent Labs    04/20/23 1207  VITAMINB12 429  FOLATE 14.7  FERRITIN 251  TIBC 364  IRON 60  RETICCTPCT 2.2   Urine analysis:    Component Value Date/Time   COLORURINE AMBER (A) 09/01/2021 1324   APPEARANCEUR HAZY (A) 09/01/2021 1324   LABSPEC 1.018 09/01/2021 1324   PHURINE 5.0 09/01/2021 1324   GLUCOSEU NEGATIVE 09/01/2021 1324   GLUCOSEU NEGATIVE 01/15/2016 1600   HGBUR NEGATIVE 09/01/2021 1324   BILIRUBINUR NEGATIVE 09/01/2021 1324   KETONESUR 5 (A) 09/01/2021 1324   PROTEINUR 30 (A) 09/01/2021 1324   UROBILINOGEN 0.2 01/15/2016 1600   NITRITE NEGATIVE 09/01/2021 1324   LEUKOCYTESUR NEGATIVE 09/01/2021 1324   Sepsis Labs: @LABRCNTIP (procalcitonin:4,lacticidven:4)  ) Recent Results (from the past 240 hours)  Resp panel by RT-PCR (RSV, Flu A&B, Covid) Anterior Nasal Swab     Status: None   Collection Time: 04/19/23  9:41 AM   Specimen: Anterior Nasal Swab  Result Value Ref Range Status   SARS Coronavirus 2 by RT PCR NEGATIVE NEGATIVE Final   Influenza A by PCR NEGATIVE NEGATIVE Final   Influenza B by PCR NEGATIVE NEGATIVE Final    Comment: (NOTE) The Xpert Xpress SARS-CoV-2/FLU/RSV plus assay is intended as an aid in the diagnosis of influenza from Nasopharyngeal swab specimens and should not be used as a sole basis for treatment. Nasal washings and aspirates are unacceptable for Xpert Xpress SARS-CoV-2/FLU/RSV testing.  Fact Sheet for Patients: bloggercourse.com  Fact Sheet for Healthcare  Providers: seriousbroker.it  This test is not yet approved or cleared by the [place]  FDA and has been authorized for detection and/or diagnosis of SARS-CoV-2 by FDA under an Emergency Use Authorization (EUA). This EUA will remain in effect (meaning this test can be used) for the duration of the COVID-19 declaration under Section 564(b)(1) of the Act, 21 U.S.C. section 360bbb-3(b)(1), unless the authorization is terminated or revoked.     Resp Syncytial Virus by PCR NEGATIVE NEGATIVE Final    Comment: (NOTE) Fact Sheet for Patients: bloggercourse.com  Fact Sheet for Healthcare Providers: seriousbroker.it  This test is not yet approved or cleared by the [place]  FDA and has been authorized for detection and/or diagnosis of SARS-CoV-2 by FDA under an Emergency Use Authorization (EUA). This EUA will remain in effect (meaning this test can be used) for the  duration of the COVID-19 declaration under Section 564(b)(1) of the Act, 21 U.S.C. section 360bbb-3(b)(1), unless the authorization is terminated or revoked.  Performed at [name], [address] [place], [place] [address]   Respiratory (~20 pathogens) panel by PCR     Status: None   Collection Time: 04/19/23 12:38 PM   Specimen: Nasopharyngeal Swab; Respiratory  Result Value Ref Range Status   Adenovirus NOT DETECTED NOT DETECTED Final   Coronavirus 229E NOT DETECTED NOT DETECTED Final    Comment: (NOTE) The Coronavirus on the Respiratory Panel, DOES NOT test for the novel  Coronavirus (2019 nCoV)    Coronavirus HKU1 NOT DETECTED NOT DETECTED Final   Coronavirus NL63 NOT DETECTED NOT DETECTED Final   Coronavirus OC43 NOT DETECTED NOT DETECTED Final   Metapneumovirus NOT DETECTED NOT DETECTED Final   Rhinovirus / Enterovirus NOT DETECTED NOT DETECTED Final   Influenza A NOT DETECTED NOT DETECTED Final   Influenza B NOT DETECTED  NOT DETECTED Final   Parainfluenza Virus 1 NOT DETECTED NOT DETECTED Final   Parainfluenza Virus 2 NOT DETECTED NOT DETECTED Final   Parainfluenza Virus 3 NOT DETECTED NOT DETECTED Final   Parainfluenza Virus 4 NOT DETECTED NOT DETECTED Final   Respiratory Syncytial Virus NOT DETECTED NOT DETECTED Final   Bordetella pertussis NOT DETECTED NOT DETECTED Final   Bordetella Parapertussis NOT DETECTED NOT DETECTED Final   Chlamydophila pneumoniae NOT DETECTED NOT DETECTED Final   Mycoplasma pneumoniae NOT DETECTED NOT DETECTED Final    Comment: Performed at [facility], [address] [place], [place] [address]  MRSA Next Gen by PCR, Nasal     Status: None   Collection Time: 04/20/23  2:14 PM   Specimen: Nasal Mucosa; Nasal Swab  Result Value Ref Range Status   MRSA by PCR Next Gen NOT DETECTED NOT DETECTED Final    Comment: (NOTE) The GeneXpert MRSA Assay (FDA approved for NASAL specimens only), is one component of a comprehensive MRSA colonization surveillance program. It is not intended to diagnose MRSA infection nor to guide or monitor treatment for MRSA infections. Test performance is not FDA approved in patients less than 52 years old. Performed at [facility], [address] [place], [place] [address]      Radiology Studies: ECHOCARDIOGRAM COMPLETE Result Date: 04/20/2023    ECHOCARDIOGRAM REPORT   Patient Name:   Kevin Cardenas Date of Exam: 04/20/2023 Medical Rec #:  [id number]       Height:       66.0 in Accession #:    [id number]      Weight:       248.1 lb Date of Birth:  07-04-1975       BSA:          2.192 m Patient Age:    47 years        BP:           116/87 mmHg Patient Gender: M               HR:           80 bpm. Exam Location:  Inpatient Procedure: 2D Echo, Cardiac Doppler, Color Doppler and Intracardiac            Opacification Agent Indications:    CHF  History:        Patient has prior history of Echocardiogram examinations, most  recent  09/02/2021. CHF; Risk Factors:Sleep Apnea, Dyslipidemia                 and Hypertension.  Sonographer:    Carmelita Hartshorn RDCS, FE, PE Referring Phys: [id number] MARSA NOVAK MELVIN IMPRESSIONS  1. No apical thrombus. Left ventricular ejection fraction, by estimation, is 35 to 40%. Left ventricular ejection fraction by 2D MOD biplane is 37.2 %. The left ventricle has moderately decreased function. The left ventricle demonstrates global hypokinesis. There is mild left ventricular hypertrophy. Left ventricular diastolic parameters are indeterminate.  2. Right ventricular systolic function is moderately reduced. The right ventricular size is mildly enlarged. There is moderately elevated pulmonary artery systolic pressure. The estimated right ventricular systolic pressure is 57.0 mmHg.  3. The mitral valve is grossly normal. No evidence of mitral valve regurgitation.  4. The aortic valve is tricuspid. Aortic valve regurgitation is not visualized.  5. The inferior vena cava is dilated in size with <50% respiratory variability, suggesting right atrial pressure of 15 mmHg. Comparison(s): Changes from prior study are noted. 09/02/2021: LVEF 35%, moderate RVE, normal RV function. FINDINGS  Left Ventricle: No apical thrombus. Left ventricular ejection fraction, by estimation, is 35 to 40%. Left ventricular ejection fraction by 2D MOD biplane is 37.2 %. The left ventricle has moderately decreased function. The left ventricle demonstrates global hypokinesis. The left ventricular internal cavity size was normal in size. There is mild left ventricular hypertrophy. Abnormal (paradoxical) septal motion, consistent with RV pacemaker. Left ventricular diastolic function could not be evaluated due to indeterminate diastolic function. Left ventricular diastolic parameters are indeterminate. Right Ventricle: The right ventricular size is mildly enlarged. No increase in right ventricular wall thickness. Right ventricular systolic function is  moderately reduced. There is moderately elevated pulmonary artery systolic pressure. The tricuspid regurgitant velocity is 3.24 m/s, and with an assumed right atrial pressure of 15 mmHg, the estimated right ventricular systolic pressure is 57.0 mmHg. Left Atrium: Left atrial size was normal in size. Right Atrium: Right atrial size was normal in size. Pericardium: Trivial pericardial effusion is present. The pericardial effusion is circumferential. Mitral Valve: The mitral valve is grossly normal. No evidence of mitral valve regurgitation. Tricuspid Valve: The tricuspid valve is grossly normal. Tricuspid valve regurgitation is trivial. Aortic Valve: The aortic valve is tricuspid. Aortic valve regurgitation is not visualized. Aortic valve mean gradient measures 2.0 mmHg. Aortic valve peak gradient measures 3.5 mmHg. Aortic valve area, by VTI measures 3.70 cm. Pulmonic Valve: The pulmonic valve was grossly normal. Pulmonic valve regurgitation is trivial. Aorta: The aortic root and ascending aorta are structurally normal, with no evidence of dilitation. Venous: The inferior vena cava is dilated in size with less than 50% respiratory variability, suggesting right atrial pressure of 15 mmHg. IAS/Shunts: No atrial level shunt detected by color flow Doppler. Additional Comments: A device lead is visualized.  LEFT VENTRICLE PLAX 2D                        Biplane EF (MOD) LVIDd:         5.40 cm         LV Biplane EF:   Left LVIDs:         4.90 cm                          ventricular LV PW:         1.10 cm  ejection LV IVS:        1.10 cm                          fraction by LVOT diam:     2.30 cm                          2D MOD LV SV:         64                               biplane is LV SV Index:   29                               37.2 %. LVOT Area:     4.15 cm                                Diastology                                LV e' medial:    6.09 cm/s LV Volumes (MOD)               LV E/e'  medial:  12.2 LV vol d, MOD    67.3 ml       LV e' lateral:   7.29 cm/s A2C:                           LV E/e' lateral: 10.2 LV vol d, MOD    121.0 ml A4C: LV vol s, MOD    41.0 ml A2C: LV vol s, MOD    76.4 ml A4C: LV SV MOD A2C:   26.3 ml LV SV MOD A4C:   121.0 ml LV SV MOD BP:    35.0 ml RIGHT VENTRICLE RV S prime:     5.98 cm/s TAPSE (M-mode): 1.5 cm LEFT ATRIUM           Index        RIGHT ATRIUM           Index LA Vol (A2C): 30.5 ml 13.92 ml/m  RA Area:     16.00 cm LA Vol (A4C): 42.3 ml 19.30 ml/m  RA Volume:   38.40 ml  17.52 ml/m  AORTIC VALVE AV Area (Vmax):    3.92 cm AV Area (Vmean):   3.78 cm AV Area (VTI):     3.70 cm AV Vmax:           93.00 cm/s AV Vmean:          62.900 cm/s AV VTI:            0.174 m AV Peak Grad:      3.5 mmHg AV Mean Grad:      2.0 mmHg LVOT Vmax:         87.80 cm/s LVOT Vmean:        57.200 cm/s LVOT VTI:          0.155 m LVOT/AV VTI ratio: 0.89  AORTA Ao Root diam: 3.20 cm MITRAL VALVE               TRICUSPID VALVE  MV Area (PHT): 5.75 cm    TR Peak grad:   42.0 mmHg MV Decel Time: 132 msec    TR Vmax:        324.00 cm/s MV E velocity: 74.40 cm/s MV A velocity: 70.60 cm/s  SHUNTS MV E/A ratio:  1.05        Systemic VTI:  0.16 m                            Systemic Diam: 2.30 cm Vinie Maxcy MD Electronically signed by Vinie Maxcy MD Signature Date/Time: 04/20/2023/4:57:10 PM    Final      Scheduled Meds:  carvedilol   6.25 mg Oral BID WC   dapagliflozin  propanediol  10 mg Oral Daily   enoxaparin  (LOVENOX ) injection  50 mg Subcutaneous Q24H   fluticasone  furoate-vilanterol  1 puff Inhalation Daily   furosemide   80 mg Intravenous BID   insulin  aspart  0-15 Units Subcutaneous TID WC   insulin  glargine-yfgn  10 Units Subcutaneous Daily   sacubitril -valsartan   1 tablet Oral BID   simvastatin   20 mg Oral QPM   sodium chloride  flush  3 mL Intravenous Q12H   spironolactone   25 mg Oral Daily   Continuous Infusions:   LOS: 1 day    Time spent:     Sigurd Pac, MD [facility]   04/21/2023, 12:24 PM

## 2023-04-22 DIAGNOSIS — J9601 Acute respiratory failure with hypoxia: Secondary | ICD-10-CM | POA: Diagnosis not present

## 2023-04-22 LAB — GLUCOSE, CAPILLARY
Glucose-Capillary: 135 mg/dL — ABNORMAL HIGH (ref 70–99)
Glucose-Capillary: 154 mg/dL — ABNORMAL HIGH (ref 70–99)
Glucose-Capillary: 105 mg/dL — ABNORMAL HIGH (ref 70–99)
Glucose-Capillary: 123 mg/dL — ABNORMAL HIGH (ref 70–99)

## 2023-04-22 LAB — BASIC METABOLIC PANEL
Anion gap: 8 (ref 5–15)
BUN: 22 mg/dL — ABNORMAL HIGH (ref 6–20)
CO2: 36 mmol/L — ABNORMAL HIGH (ref 22–32)
Calcium: 8.7 mg/dL — ABNORMAL LOW (ref 8.9–10.3)
Chloride: 95 mmol/L — ABNORMAL LOW (ref 98–111)
Creatinine, Ser: 0.88 mg/dL (ref 0.61–1.24)
GFR, Estimated: >60 mL/min (ref >60)
Glucose, Bld: 116 mg/dL — ABNORMAL HIGH (ref 70–99)
Potassium: 3.9 mmol/L (ref 3.5–5.1)
Sodium: 139 mmol/L (ref 135–145)

## 2023-04-22 NOTE — Progress Notes (Signed)
 PROGRESS NOTE    Kevin Cardenas  FMW:[id number] DOB: [birth year] DOA: 04/19/2023 PCP: [name], MD  47/M with chronic systolic CHF, NICM, obesity, OSA, hypertension, dyslipidemia, asthma presented to the ED with worsening shortness of breath and edema X few days associated with chest tightness. -Has not seen cardiology in over a year, taking some of his heart failure medicines. -In the ED he was hypoxic, troponin 34, creatinine 1.2 BNP 320, chest x-ray with?  Right upper lobe opacity, cardiomegaly -Admitted, started on diuretics and broad-spectrum antibiotics  Subjective: -Continues to feel better overall, breathing improving, still requiring oxygen  Assessment and Plan:  Acute on chronic systolic CHF -Last echo 2023 with EF 35-40%, indeterminate diastolic function, normal RV, severely dilated right ventricle -Repeat echo now with a EF 35-40%, moderately reduced RV and moderately elevated PASP -Volume overloaded on admission, has been out of some heart failure meds -Improving slowly, 3 L negative, weight likely inaccurate -Continue IV Lasix  80 Mg twice daily, Coreg , Farxiga , Aldactone  -Blood pressures in the 90s will hold Entresto  -Dietitian consulted -Will send meds to [facility], at home health RN at discharge, has follow-up with Dr. Rolan this month -Wean O2, increase activity  History of MSSA bacteremia with endocarditis -In 08/2021, ICD extracted then, completed course of cefazolin   Acute respiratory failure with hypoxia -As above, wean O2  Morbid obesity OSA -Continue CPAP -Needs diet and lifestyle modification  Hyperglycemia, diabetes mellitus -A1c is 7.2 -Continue Farxiga   Macrocytosis with mild thrombocytopenia -B12 and folate are normal, would benefit from hematology follow-up  DVT prophylaxis: Lovenox  Code Status: Full code Family Communication: None present Disposition Plan: Home likely tomorrow  Consultants:    Procedures:   Antimicrobials:     Objective: Vitals:   04/21/23 1732 04/21/23 2003 04/21/23 2056 04/22/23 0409  BP: (!) 86/65 104/80 100/68 100/77  Pulse:  87 87 82  Resp:  16 18 16   Temp:  97.9 F (36.6 C)  98.2 F (36.8 C)  TempSrc:  Oral  Oral  SpO2:  95% 94% 95%  Weight:    111.1 kg  Height:        Intake/Output Summary (Last 24 hours) at 04/22/2023 1102 Last data filed at 04/22/2023 0927 Gross per 24 hour  Intake 360 ml  Output --  Net 360 ml   Filed Weights   04/20/23 0429 04/21/23 0500 04/22/23 0409  Weight: 112.5 kg 110.3 kg 111.1 kg    Examination:  Gen: Awake, Alert, Oriented X 3, obese chronically ill HEENT: + JVD Lungs: Good air movement bilaterally, CTAB CVS: S1S2/RRR Abd: soft, Non tender, non distended, BS present Extremities: 1+ edema  skin: no new rashes on exposed skin  Psychiatry: Poor insight    Data Reviewed:   CBC: Recent Labs  Lab 04/19/23 0902 04/19/23 1019 04/20/23 0308  WBC 7.3  --  7.4  HGB 14.0 14.6 14.6  HCT 43.2 43.0 45.8  MCV 100.2*  --  102.7*  PLT 142*  --  140*   Basic Metabolic Panel: Recent Labs  Lab 04/19/23 0902 04/19/23 1019 04/19/23 1143 04/20/23 0308 04/21/23 0437 04/22/23 0437  NA 138 138  --  135 140 139  K 3.9 4.0  --  4.5 3.7 3.9  CL 97*  --   --  94* 93* 95*  CO2 28  --   --  28 36* 36*  GLUCOSE 154*  --   --  256* 116* 116*  BUN 22*  --   --  23* 25*  22*  CREATININE 1.23  --   --  1.10 1.11 0.88  CALCIUM  8.0*  --   --  8.1* 8.3* 8.7*  MG  --   --  3.3*  --   --   --    GFR: Estimated Creatinine Clearance: 120.1 mL/min (by C-G formula based on SCr of 0.88 mg/dL). Liver Function Tests: Recent Labs  Lab 04/20/23 0308 04/21/23 0437  AST 23 24  ALT 34 33  ALKPHOS 56 52  BILITOT 0.8 0.7  PROT 6.8 6.3*  ALBUMIN 3.4* 3.1*   No results for input(s): LIPASE, AMYLASE in the last 168 hours. No results for input(s): AMMONIA in the last 168 hours. Coagulation Profile: No results for input(s): INR, PROTIME in the last  168 hours. Cardiac Enzymes: No results for input(s): CKTOTAL, CKMB, CKMBINDEX, TROPONINI in the last 168 hours. BNP (last 3 results) No results for input(s): PROBNP in the last 8760 hours. HbA1C: Recent Labs    04/20/23 0757  HGBA1C 7.2*   CBG: Recent Labs  Lab 04/21/23 0756 04/21/23 1158 04/21/23 1637 04/21/23 2117 04/22/23 0744  GLUCAP 174* 135* 137* 117* 135*   Lipid Profile: No results for input(s): CHOL, HDL, LDLCALC, TRIG, CHOLHDL, LDLDIRECT in the last 72 hours. Thyroid  Function Tests: No results for input(s): TSH, T4TOTAL, FREET4, T3FREE, THYROIDAB in the last 72 hours. Anemia Panel: Recent Labs    04/20/23 1207  VITAMINB12 429  FOLATE 14.7  FERRITIN 251  TIBC 364  IRON 60  RETICCTPCT 2.2   Urine analysis:    Component Value Date/Time   COLORURINE AMBER (A) 09/01/2021 1324   APPEARANCEUR HAZY (A) 09/01/2021 1324   LABSPEC 1.018 09/01/2021 1324   PHURINE 5.0 09/01/2021 1324   GLUCOSEU NEGATIVE 09/01/2021 1324   GLUCOSEU NEGATIVE 01/15/2016 1600   HGBUR NEGATIVE 09/01/2021 1324   BILIRUBINUR NEGATIVE 09/01/2021 1324   KETONESUR 5 (A) 09/01/2021 1324   PROTEINUR 30 (A) 09/01/2021 1324   UROBILINOGEN 0.2 01/15/2016 1600   NITRITE NEGATIVE 09/01/2021 1324   LEUKOCYTESUR NEGATIVE 09/01/2021 1324   Sepsis Labs: @LABRCNTIP (procalcitonin:4,lacticidven:4)  ) Recent Results (from the past 240 hours)  Resp panel by RT-PCR (RSV, Flu A&B, Covid) Anterior Nasal Swab     Status: None   Collection Time: 04/19/23  9:41 AM   Specimen: Anterior Nasal Swab  Result Value Ref Range Status   SARS Coronavirus 2 by RT PCR NEGATIVE NEGATIVE Final   Influenza A by PCR NEGATIVE NEGATIVE Final   Influenza B by PCR NEGATIVE NEGATIVE Final    Comment: (NOTE) The Xpert Xpress SARS-CoV-2/FLU/RSV plus assay is intended as an aid in the diagnosis of influenza from Nasopharyngeal swab specimens and should not be used as a sole basis for  treatment. Nasal washings and aspirates are unacceptable for Xpert Xpress SARS-CoV-2/FLU/RSV testing.  Fact Sheet for Patients: bloggercourse.com  Fact Sheet for Healthcare Providers: seriousbroker.it  This test is not yet approved or cleared by the [place]  FDA and has been authorized for detection and/or diagnosis of SARS-CoV-2 by FDA under an Emergency Use Authorization (EUA). This EUA will remain in effect (meaning this test can be used) for the duration of the COVID-19 declaration under Section 564(b)(1) of the Act, 21 U.S.C. section 360bbb-3(b)(1), unless the authorization is terminated or revoked.     Resp Syncytial Virus by PCR NEGATIVE NEGATIVE Final    Comment: (NOTE) Fact Sheet for Patients: bloggercourse.com  Fact Sheet for Healthcare Providers: seriousbroker.it  This test is not yet approved or cleared by  the [place]  FDA and has been authorized for detection and/or diagnosis of SARS-CoV-2 by FDA under an Emergency Use Authorization (EUA). This EUA will remain in effect (meaning this test can be used) for the duration of the COVID-19 declaration under Section 564(b)(1) of the Act, 21 U.S.C. section 360bbb-3(b)(1), unless the authorization is terminated or revoked.  Performed at [facility], [address], [place], [place] [address]   Respiratory (~20 pathogens) panel by PCR     Status: None   Collection Time: 04/19/23 12:38 PM   Specimen: Nasopharyngeal Swab; Respiratory  Result Value Ref Range Status   Adenovirus NOT DETECTED NOT DETECTED Final   Coronavirus 229E NOT DETECTED NOT DETECTED Final    Comment: (NOTE) The Coronavirus on the Respiratory Panel, DOES NOT test for the novel  Coronavirus (2019 nCoV)    Coronavirus HKU1 NOT DETECTED NOT DETECTED Final   Coronavirus NL63 NOT DETECTED NOT DETECTED Final   Coronavirus OC43 NOT DETECTED  NOT DETECTED Final   Metapneumovirus NOT DETECTED NOT DETECTED Final   Rhinovirus / Enterovirus NOT DETECTED NOT DETECTED Final   Influenza A NOT DETECTED NOT DETECTED Final   Influenza B NOT DETECTED NOT DETECTED Final   Parainfluenza Virus 1 NOT DETECTED NOT DETECTED Final   Parainfluenza Virus 2 NOT DETECTED NOT DETECTED Final   Parainfluenza Virus 3 NOT DETECTED NOT DETECTED Final   Parainfluenza Virus 4 NOT DETECTED NOT DETECTED Final   Respiratory Syncytial Virus NOT DETECTED NOT DETECTED Final   Bordetella pertussis NOT DETECTED NOT DETECTED Final   Bordetella Parapertussis NOT DETECTED NOT DETECTED Final   Chlamydophila pneumoniae NOT DETECTED NOT DETECTED Final   Mycoplasma pneumoniae NOT DETECTED NOT DETECTED Final    Comment: Performed at [facility], [address] [place], [place] [address]  MRSA Next Gen by PCR, Nasal     Status: None   Collection Time: 04/20/23  2:14 PM   Specimen: Nasal Mucosa; Nasal Swab  Result Value Ref Range Status   MRSA by PCR Next Gen NOT DETECTED NOT DETECTED Final    Comment: (NOTE) The GeneXpert MRSA Assay (FDA approved for NASAL specimens only), is one component of a comprehensive MRSA colonization surveillance program. It is not intended to diagnose MRSA infection nor to guide or monitor treatment for MRSA infections. Test performance is not FDA approved in patients less than 32 years old. Performed at [facility], [address], [place], [place] [address]      Radiology Studies: ECHOCARDIOGRAM COMPLETE Result Date: 04/20/2023    ECHOCARDIOGRAM REPORT   Patient Name:   Kevin Cardenas Date of Exam: 04/20/2023 Medical Rec #:  [id number]       Height:       66.0 in Accession #:    [id number]      Weight:       248.1 lb Date of Birth:  [birth year]       BSA:          2.192 m Patient Age:    47 years        BP:           116/87 mmHg Patient Gender: M               HR:           80 bpm. Exam Location:  Inpatient Procedure: 2D Echo,  Cardiac Doppler, Color Doppler and Intracardiac  Opacification Agent Indications:    CHF  History:        Patient has prior history of Echocardiogram examinations, most                 recent 09/02/2021. CHF; Risk Factors:Sleep Apnea, Dyslipidemia                 and Hypertension.  Sonographer:    Carmelita Hartshorn RDCS, FE, PE Referring Phys: [id number] MARSA NOVAK MELVIN IMPRESSIONS  1. No apical thrombus. Left ventricular ejection fraction, by estimation, is 35 to 40%. Left ventricular ejection fraction by 2D MOD biplane is 37.2 %. The left ventricle has moderately decreased function. The left ventricle demonstrates global hypokinesis. There is mild left ventricular hypertrophy. Left ventricular diastolic parameters are indeterminate.  2. Right ventricular systolic function is moderately reduced. The right ventricular size is mildly enlarged. There is moderately elevated pulmonary artery systolic pressure. The estimated right ventricular systolic pressure is 57.0 mmHg.  3. The mitral valve is grossly normal. No evidence of mitral valve regurgitation.  4. The aortic valve is tricuspid. Aortic valve regurgitation is not visualized.  5. The inferior vena cava is dilated in size with <50% respiratory variability, suggesting right atrial pressure of 15 mmHg. Comparison(s): Changes from prior study are noted. 09/02/2021: LVEF 35%, moderate RVE, normal RV function. FINDINGS  Left Ventricle: No apical thrombus. Left ventricular ejection fraction, by estimation, is 35 to 40%. Left ventricular ejection fraction by 2D MOD biplane is 37.2 %. The left ventricle has moderately decreased function. The left ventricle demonstrates global hypokinesis. The left ventricular internal cavity size was normal in size. There is mild left ventricular hypertrophy. Abnormal (paradoxical) septal motion, consistent with RV pacemaker. Left ventricular diastolic function could not be evaluated due to indeterminate diastolic function. Left  ventricular diastolic parameters are indeterminate. Right Ventricle: The right ventricular size is mildly enlarged. No increase in right ventricular wall thickness. Right ventricular systolic function is moderately reduced. There is moderately elevated pulmonary artery systolic pressure. The tricuspid regurgitant velocity is 3.24 m/s, and with an assumed right atrial pressure of 15 mmHg, the estimated right ventricular systolic pressure is 57.0 mmHg. Left Atrium: Left atrial size was normal in size. Right Atrium: Right atrial size was normal in size. Pericardium: Trivial pericardial effusion is present. The pericardial effusion is circumferential. Mitral Valve: The mitral valve is grossly normal. No evidence of mitral valve regurgitation. Tricuspid Valve: The tricuspid valve is grossly normal. Tricuspid valve regurgitation is trivial. Aortic Valve: The aortic valve is tricuspid. Aortic valve regurgitation is not visualized. Aortic valve mean gradient measures 2.0 mmHg. Aortic valve peak gradient measures 3.5 mmHg. Aortic valve area, by VTI measures 3.70 cm. Pulmonic Valve: The pulmonic valve was grossly normal. Pulmonic valve regurgitation is trivial. Aorta: The aortic root and ascending aorta are structurally normal, with no evidence of dilitation. Venous: The inferior vena cava is dilated in size with less than 50% respiratory variability, suggesting right atrial pressure of 15 mmHg. IAS/Shunts: No atrial level shunt detected by color flow Doppler. Additional Comments: A device lead is visualized.  LEFT VENTRICLE PLAX 2D                        Biplane EF (MOD) LVIDd:         5.40 cm         LV Biplane EF:   Left LVIDs:         4.90 cm  ventricular LV PW:         1.10 cm                          ejection LV IVS:        1.10 cm                          fraction by LVOT diam:     2.30 cm                          2D MOD LV SV:         64                               biplane is LV SV Index:    29                               37.2 %. LVOT Area:     4.15 cm                                Diastology                                LV e' medial:    6.09 cm/s LV Volumes (MOD)               LV E/e' medial:  12.2 LV vol d, MOD    67.3 ml       LV e' lateral:   7.29 cm/s A2C:                           LV E/e' lateral: 10.2 LV vol d, MOD    121.0 ml A4C: LV vol s, MOD    41.0 ml A2C: LV vol s, MOD    76.4 ml A4C: LV SV MOD A2C:   26.3 ml LV SV MOD A4C:   121.0 ml LV SV MOD BP:    35.0 ml RIGHT VENTRICLE RV S prime:     5.98 cm/s TAPSE (M-mode): 1.5 cm LEFT ATRIUM           Index        RIGHT ATRIUM           Index LA Vol (A2C): 30.5 ml 13.92 ml/m  RA Area:     16.00 cm LA Vol (A4C): 42.3 ml 19.30 ml/m  RA Volume:   38.40 ml  17.52 ml/m  AORTIC VALVE AV Area (Vmax):    3.92 cm AV Area (Vmean):   3.78 cm AV Area (VTI):     3.70 cm AV Vmax:           93.00 cm/s AV Vmean:          62.900 cm/s AV VTI:            0.174 m AV Peak Grad:      3.5 mmHg AV Mean Grad:      2.0 mmHg LVOT Vmax:         87.80 cm/s LVOT Vmean:        57.200 cm/s LVOT VTI:  0.155 m LVOT/AV VTI ratio: 0.89  AORTA Ao Root diam: 3.20 cm MITRAL VALVE               TRICUSPID VALVE MV Area (PHT): 5.75 cm    TR Peak grad:   42.0 mmHg MV Decel Time: 132 msec    TR Vmax:        324.00 cm/s MV E velocity: 74.40 cm/s MV A velocity: 70.60 cm/s  SHUNTS MV E/A ratio:  1.05        Systemic VTI:  0.16 m                            Systemic Diam: 2.30 cm Vinie Maxcy MD Electronically signed by Vinie Maxcy MD Signature Date/Time: 04/20/2023/4:57:10 PM    Final      Scheduled Meds:  carvedilol   6.25 mg Oral BID WC   dapagliflozin  propanediol  10 mg Oral Daily   enoxaparin  (LOVENOX ) injection  50 mg Subcutaneous Q24H   fluticasone  furoate-vilanterol  1 puff Inhalation Daily   furosemide   80 mg Intravenous BID   insulin  aspart  0-15 Units Subcutaneous TID WC   insulin  glargine-yfgn  10 Units Subcutaneous Daily   sacubitril -valsartan   1  tablet Oral BID   simvastatin   20 mg Oral QPM   sodium chloride  flush  3 mL Intravenous Q12H   spironolactone   25 mg Oral Daily   Continuous Infusions:   LOS: 2 days    Time spent:    Sigurd Pac, MD [facility]   04/22/2023, 11:02 AM

## 2023-04-22 NOTE — Plan of Care (Signed)
  Problem: Activity: Goal: Capacity to carry out activities will improve Outcome: Progressing   Problem: Cardiac: Goal: Ability to achieve and maintain adequate cardiopulmonary perfusion will improve Outcome: Progressing   Problem: Clinical Measurements: Goal: Respiratory complications will improve Outcome: Progressing Goal: Cardiovascular complication will be avoided Outcome: Progressing   Problem: Safety: Goal: Ability to remain free from injury will improve Outcome: Progressing

## 2023-04-22 NOTE — Progress Notes (Signed)
   04/22/23 2244  BiPAP/CPAP/SIPAP  Reason BIPAP/CPAP not in use Non-compliant (pt refused)

## 2023-04-23 ENCOUNTER — Other Ambulatory Visit (HOSPITAL_COMMUNITY): Payer: Self-pay

## 2023-04-23 DIAGNOSIS — J9601 Acute respiratory failure with hypoxia: Secondary | ICD-10-CM | POA: Diagnosis not present

## 2023-04-23 LAB — GLUCOSE, CAPILLARY: Glucose-Capillary: 163 mg/dL — ABNORMAL HIGH (ref 70–99)

## 2023-04-23 LAB — BASIC METABOLIC PANEL
Anion gap: 12 (ref 5–15)
BUN: 23 mg/dL — ABNORMAL HIGH (ref 6–20)
CO2: 36 mmol/L — ABNORMAL HIGH (ref 22–32)
Calcium: 9 mg/dL (ref 8.9–10.3)
Chloride: 90 mmol/L — ABNORMAL LOW (ref 98–111)
Creatinine, Ser: 0.98 mg/dL (ref 0.61–1.24)
GFR, Estimated: >60 mL/min (ref >60)
Glucose, Bld: 152 mg/dL — ABNORMAL HIGH (ref 70–99)
Potassium: 3.5 mmol/L (ref 3.5–5.1)
Sodium: 138 mmol/L (ref 135–145)

## 2023-04-23 MED ORDER — TORSEMIDE 20 MG PO TABS: 40.0000 mg | ORAL_TABLET | Freq: Every day | ORAL | Status: DC

## 2023-04-23 MED ORDER — FLUTICASONE FUROATE-VILANTEROL 100-25 MCG/ACT IN AEPB
1.0000 | INHALATION_SPRAY | Freq: Every day | RESPIRATORY_TRACT | Status: DC
  Administered 2023-04-23: 1 via RESPIRATORY_TRACT
  Filled 2023-04-23: qty 28

## 2023-04-23 MED ORDER — TORSEMIDE 20 MG PO TABS
40.0000 mg | ORAL_TABLET | Freq: Every day | ORAL | 1 refills | Status: DC
  Filled 2023-04-23: qty 60, 30d supply, fill #0

## 2023-04-23 MED ORDER — SIMVASTATIN 20 MG PO TABS
20.0000 mg | ORAL_TABLET | Freq: Every evening | ORAL | 1 refills | Status: AC
  Filled 2023-04-23 – 2023-08-18 (×2): qty 60, 60d supply, fill #0
  Filled 2023-10-26: qty 60, 60d supply, fill #1

## 2023-04-23 MED ORDER — SPIRONOLACTONE 25 MG PO TABS
25.0000 mg | ORAL_TABLET | Freq: Every day | ORAL | 1 refills | Status: DC
  Filled 2023-04-23: qty 60, 60d supply, fill #0

## 2023-04-23 MED ORDER — EMPAGLIFLOZIN 10 MG PO TABS
10.0000 mg | ORAL_TABLET | Freq: Every day | ORAL | 1 refills | Status: DC
  Filled 2023-04-23: qty 30, 30d supply, fill #0

## 2023-04-23 MED ORDER — CARVEDILOL 6.25 MG PO TABS
6.2500 mg | ORAL_TABLET | Freq: Two times a day (BID) | ORAL | 1 refills | Status: DC
  Filled 2023-04-23: qty 60, 30d supply, fill #0

## 2023-04-23 MED ORDER — POTASSIUM CHLORIDE CRYS ER 20 MEQ PO TBCR
40.0000 meq | EXTENDED_RELEASE_TABLET | Freq: Every day | ORAL | 1 refills | Status: DC
  Filled 2023-04-23: qty 60, 30d supply, fill #0

## 2023-04-23 NOTE — TOC Transition Note (Signed)
 Transition of Care [facility]) - Discharge Note   Patient Details  Name: Kevin Cardenas MRN: [id number] Date of Birth: 03-11-1976  [facility] CM/SW Contact:  Sudie Erminio Deems, RN Phone Number: 04/23/2023, 10:36 AM   Clinical Narrative: Plan for transition home. Staff RN ambulated the patient and he does not qualify for home oxygen. No further needs identified at this time.     Final next level of care: Home/Self Care Barriers to Discharge: No Barriers Identified  Patient Goals and CMS Choice Patient states their goals for this hospitalization and ongoing recovery are:: to retun home with spouse   Discharge Plan and Services Additional resources added to the After Visit Summary for       Post Acute Care Choice: NA            DME Agency: NA  Social Drivers of Health (SDOH) Interventions SDOH Screenings   Food Insecurity: No Food Insecurity (04/19/2023)  Housing: Low Risk  (04/19/2023)  Transportation Needs: No Transportation Needs (04/19/2023)  Utilities: Not At Risk (04/19/2023)  Alcohol Screen: Low Risk  (12/04/2022)  Depression (PHQ2-9): Low Risk  (12/04/2022)  Financial Resource Strain: Low Risk  (12/04/2022)  Physical Activity: Sufficiently Active (12/04/2022)  Social Connections: Moderately Integrated (12/04/2022)  Stress: No Stress Concern Present (12/04/2022)  Tobacco Use: Medium Risk (04/20/2023)  Health Literacy: Adequate Health Literacy (12/04/2022)   Readmission Risk Interventions     No data to display

## 2023-04-23 NOTE — Discharge Summary (Addendum)
 Physician Discharge Summary  KIETH HARTIS FMW:[id number] DOB: 07/26/1975 DOA: 04/19/2023  PCP: [name], MD  Admit date: 04/19/2023 Discharge date: 04/23/2023  Time spent: 45 minutes  Recommendations for Outpatient Follow-up:  Advanced heart failure clinic Dr. Rolan in 2 weeks Needs nonurgent hematology follow-up recommended if he has persistent macrocytosis and thrombocytopenia   Discharge Diagnoses:  Principal Problem:   Acute respiratory failure with hypoxia (HCC) Acute on chronic systolic CHF   HTN (hypertension)   Hyperlipidemia with target LDL less than 100   OSA on CPAP   Obese   PNA (pneumonia)   Acute on chronic systolic CHF (congestive heart failure) (HCC)   Discharge Condition: Improved  Diet recommendation: Low-sodium, heart healthy, diabetic  Filed Weights   04/21/23 0500 04/22/23 0409 04/23/23 0635  Weight: 110.3 kg 111.1 kg 109 kg    History of present illness:  47/M with chronic systolic CHF, NICM, obesity, OSA, hypertension, dyslipidemia, asthma presented to the ED with worsening shortness of breath and edema X few days associated with chest tightness. -Has not seen cardiology in over a year, taking some of his heart failure medicines. -In the ED he was hypoxic, troponin 34, creatinine 1.2 BNP 320, chest x-ray with?  Right upper lobe opacity, cardiomegaly -Admitted, started on diuretics and broad-spectrum antibiotics  Hospital Course:   Acute on chronic systolic CHF -Last echo 2023 with EF 35-40%, indeterminate diastolic function, normal RV, severely dilated right ventricle -Repeat echo now with a EF 35-40%, moderately reduced RV and moderately elevated PASP -Volume overloaded on admission, has been out of some heart failure meds -Improved with diuresis, volume status improving, weight down 8 LB, appears close to euvolemic, transition to oral Lasix , Coreg , Farxiga  and Aldactone  -Blood pressure pressures remain soft SBP 88-95 throughout this  admission, Entresto  discontinued -Dietitian consulted -Will send meds to [facility] [place], at home health RN at discharge, has follow-up with Dr. Rolan this month   History of MSSA bacteremia with endocarditis -In 08/2021, ICD extracted then, completed course of cefazolin    Acute respiratory failure with hypoxia -As above, wean O2   Morbid obesity OSA -Continue CPAP -Needs diet and lifestyle modification   Hyperglycemia, diabetes mellitus -A1c is 7.2 -Continue Farxiga    Macrocytosis with mild thrombocytopenia -B12 and folate are normal, would benefit from hematology follow-up    Discharge Exam: Vitals:   04/23/23 0736 04/23/23 0822  BP:  94/66  Pulse:    Resp:  16  Temp:  97.9 F (36.6 C)  SpO2: 92%      Discharge Instructions   Discharge Instructions     Diet - low sodium heart healthy   Complete by: As directed    Diet Carb Modified   Complete by: As directed    Increase activity slowly   Complete by: As directed       Allergies as of 04/23/2023       Reactions   Metolazone  Other (See Comments)   Dried up my kidneys- took only one tablet        Medication List     STOP taking these medications    dapagliflozin  propanediol 10 MG Tabs tablet Commonly known as: Farxiga  Replaced by: empagliflozin  10 MG Tabs tablet   Entresto  49-51 MG Generic drug: sacubitril -valsartan        TAKE these medications    albuterol  108 (90 Base) MCG/ACT inhaler Commonly known as: VENTOLIN  HFA Inhale 1-2 puffs into the lungs every 1 - 2 hours for up to 3 doses, then every 2  to 4 hours, as needed for shortness of breath. Contact PCP if shortness of breath persists after 6 puffs.   ascorbic acid 500 MG tablet Commonly known as: VITAMIN C Take 500 mg by mouth daily.   Breo Ellipta  100-25 MCG/ACT Aepb Generic drug: fluticasone  furoate-vilanterol Inhale 1 puff into the lungs daily.   carvedilol  6.25 MG tablet Commonly known as: COREG  Take 1 tablet (6.25 mg total) by mouth  2 (two) times daily with a meal. NEEDS FOLLOW UP APPOINTMENT FOR MORE REFILLS   cholecalciferol 25 MCG (1000 UNIT) tablet Commonly known as: VITAMIN D3 Take 1 tablet (25 mcg total) by mouth daily for Vitamin D deficiency   empagliflozin  10 MG Tabs tablet Commonly known as: Jardiance  Take 1 tablet (10 mg total) by mouth daily. Replaces: dapagliflozin  propanediol 10 MG Tabs tablet   potassium chloride  SA 20 MEQ tablet Commonly known as: KLOR-CON  M Take 2 tablets (40 mEq total) by mouth daily.   simvastatin  20 MG tablet Commonly known as: ZOCOR  Take 1 tablet (20 mg total) by mouth every evening for hyperlipidemia. What changed: Another medication with the same name was removed. Continue taking this medication, and follow the directions you see here.   spironolactone  25 MG tablet Commonly known as: ALDACTONE  Take 1 tablet (25 mg total) by mouth daily.   torsemide  20 MG tablet Commonly known as: DEMADEX  Take 2 tablets (40 mg total) by mouth daily. NEEDS FOLLOW UP APPOINTMENT FOR MORE REFILLS       Allergies  Allergen Reactions   Metolazone  Other (See Comments)    Dried up my kidneys- took only one tablet      The results of significant diagnostics from this hospitalization (including imaging, microbiology, ancillary and laboratory) are listed below for reference.    Significant Diagnostic Studies: ECHOCARDIOGRAM COMPLETE Result Date: 04/20/2023    ECHOCARDIOGRAM REPORT   Patient Name:   MAAN ZARCONE Date of Exam: 04/20/2023 Medical Rec #:  [id number]       Height:       66.0 in Accession #:    [id number]      Weight:       248.1 lb Date of Birth:  [birth year]       BSA:          2.192 m Patient Age:    47 years        BP:           116/87 mmHg Patient Gender: M               HR:           80 bpm. Exam Location:  Inpatient Procedure: 2D Echo, Cardiac Doppler, Color Doppler and Intracardiac            Opacification Agent Indications:    CHF  History:        Patient has prior  history of Echocardiogram examinations, most                 recent 09/02/2021. CHF; Risk Factors:Sleep Apnea, Dyslipidemia                 and Hypertension.  Sonographer:    Carmelita Hartshorn RDCS, FE, PE Referring Phys: [id number] MARSA NOVAK MELVIN IMPRESSIONS  1. No apical thrombus. Left ventricular ejection fraction, by estimation, is 35 to 40%. Left ventricular ejection fraction by 2D MOD biplane is 37.2 %. The left ventricle has moderately decreased function. The left ventricle demonstrates global hypokinesis. There is mild  left ventricular hypertrophy. Left ventricular diastolic parameters are indeterminate.  2. Right ventricular systolic function is moderately reduced. The right ventricular size is mildly enlarged. There is moderately elevated pulmonary artery systolic pressure. The estimated right ventricular systolic pressure is 57.0 mmHg.  3. The mitral valve is grossly normal. No evidence of mitral valve regurgitation.  4. The aortic valve is tricuspid. Aortic valve regurgitation is not visualized.  5. The inferior vena cava is dilated in size with <50% respiratory variability, suggesting right atrial pressure of 15 mmHg. Comparison(s): Changes from prior study are noted. 09/02/2021: LVEF 35%, moderate RVE, normal RV function. FINDINGS  Left Ventricle: No apical thrombus. Left ventricular ejection fraction, by estimation, is 35 to 40%. Left ventricular ejection fraction by 2D MOD biplane is 37.2 %. The left ventricle has moderately decreased function. The left ventricle demonstrates global hypokinesis. The left ventricular internal cavity size was normal in size. There is mild left ventricular hypertrophy. Abnormal (paradoxical) septal motion, consistent with RV pacemaker. Left ventricular diastolic function could not be evaluated due to indeterminate diastolic function. Left ventricular diastolic parameters are indeterminate. Right Ventricle: The right ventricular size is mildly enlarged. No increase in right  ventricular wall thickness. Right ventricular systolic function is moderately reduced. There is moderately elevated pulmonary artery systolic pressure. The tricuspid regurgitant velocity is 3.24 m/s, and with an assumed right atrial pressure of 15 mmHg, the estimated right ventricular systolic pressure is 57.0 mmHg. Left Atrium: Left atrial size was normal in size. Right Atrium: Right atrial size was normal in size. Pericardium: Trivial pericardial effusion is present. The pericardial effusion is circumferential. Mitral Valve: The mitral valve is grossly normal. No evidence of mitral valve regurgitation. Tricuspid Valve: The tricuspid valve is grossly normal. Tricuspid valve regurgitation is trivial. Aortic Valve: The aortic valve is tricuspid. Aortic valve regurgitation is not visualized. Aortic valve mean gradient measures 2.0 mmHg. Aortic valve peak gradient measures 3.5 mmHg. Aortic valve area, by VTI measures 3.70 cm. Pulmonic Valve: The pulmonic valve was grossly normal. Pulmonic valve regurgitation is trivial. Aorta: The aortic root and ascending aorta are structurally normal, with no evidence of dilitation. Venous: The inferior vena cava is dilated in size with less than 50% respiratory variability, suggesting right atrial pressure of 15 mmHg. IAS/Shunts: No atrial level shunt detected by color flow Doppler. Additional Comments: A device lead is visualized.  LEFT VENTRICLE PLAX 2D                        Biplane EF (MOD) LVIDd:         5.40 cm         LV Biplane EF:   Left LVIDs:         4.90 cm                          ventricular LV PW:         1.10 cm                          ejection LV IVS:        1.10 cm                          fraction by LVOT diam:     2.30 cm  2D MOD LV SV:         64                               biplane is LV SV Index:   29                               37.2 %. LVOT Area:     4.15 cm                                Diastology                                 LV e' medial:    6.09 cm/s LV Volumes (MOD)               LV E/e' medial:  12.2 LV vol d, MOD    67.3 ml       LV e' lateral:   7.29 cm/s A2C:                           LV E/e' lateral: 10.2 LV vol d, MOD    121.0 ml A4C: LV vol s, MOD    41.0 ml A2C: LV vol s, MOD    76.4 ml A4C: LV SV MOD A2C:   26.3 ml LV SV MOD A4C:   121.0 ml LV SV MOD BP:    35.0 ml RIGHT VENTRICLE RV S prime:     5.98 cm/s TAPSE (M-mode): 1.5 cm LEFT ATRIUM           Index        RIGHT ATRIUM           Index LA Vol (A2C): 30.5 ml 13.92 ml/m  RA Area:     16.00 cm LA Vol (A4C): 42.3 ml 19.30 ml/m  RA Volume:   38.40 ml  17.52 ml/m  AORTIC VALVE AV Area (Vmax):    3.92 cm AV Area (Vmean):   3.78 cm AV Area (VTI):     3.70 cm AV Vmax:           93.00 cm/s AV Vmean:          62.900 cm/s AV VTI:            0.174 m AV Peak Grad:      3.5 mmHg AV Mean Grad:      2.0 mmHg LVOT Vmax:         87.80 cm/s LVOT Vmean:        57.200 cm/s LVOT VTI:          0.155 m LVOT/AV VTI ratio: 0.89  AORTA Ao Root diam: 3.20 cm MITRAL VALVE               TRICUSPID VALVE MV Area (PHT): 5.75 cm    TR Peak grad:   42.0 mmHg MV Decel Time: 132 msec    TR Vmax:        324.00 cm/s MV E velocity: 74.40 cm/s MV A velocity: 70.60 cm/s  SHUNTS MV E/A ratio:  1.05        Systemic VTI:  0.16 m  Systemic Diam: 2.30 cm Vinie Maxcy MD Electronically signed by Vinie Maxcy MD Signature Date/Time: 04/20/2023/4:57:10 PM    Final    DG Chest 2 View Result Date: 04/19/2023 CLINICAL DATA:  Shortness of breath and chest pain EXAM: CHEST - 2 VIEW COMPARISON:  09/02/2021 FINDINGS: Airspace disease in the right upper lobe best seen on the frontal view. Chronic cardiomegaly with single chamber pacer on the left no longer seen. No visible effusion or cavitation. No pneumothorax. IMPRESSION: 1. Right upper lobe opacity, usually pneumonia. Recommend follow-up to clearing. 2. Cardiomegaly. Electronically Signed   By: Dorn Roulette M.D.   On: 04/19/2023 11:08     Microbiology: Recent Results (from the past 240 hours)  Resp panel by RT-PCR (RSV, Flu A&B, Covid) Anterior Nasal Swab     Status: None   Collection Time: 04/19/23  9:41 AM   Specimen: Anterior Nasal Swab  Result Value Ref Range Status   SARS Coronavirus 2 by RT PCR NEGATIVE NEGATIVE Final   Influenza A by PCR NEGATIVE NEGATIVE Final   Influenza B by PCR NEGATIVE NEGATIVE Final    Comment: (NOTE) The Xpert Xpress SARS-CoV-2/FLU/RSV plus assay is intended as an aid in the diagnosis of influenza from Nasopharyngeal swab specimens and should not be used as a sole basis for treatment. Nasal washings and aspirates are unacceptable for Xpert Xpress SARS-CoV-2/FLU/RSV testing.  Fact Sheet for Patients: bloggercourse.com  Fact Sheet for Healthcare Providers: seriousbroker.it  This test is not yet approved or cleared by the [place]  FDA and has been authorized for detection and/or diagnosis of SARS-CoV-2 by FDA under an Emergency Use Authorization (EUA). This EUA will remain in effect (meaning this test can be used) for the duration of the COVID-19 declaration under Section 564(b)(1) of the Act, 21 U.S.C. section 360bbb-3(b)(1), unless the authorization is terminated or revoked.     Resp Syncytial Virus by PCR NEGATIVE NEGATIVE Final    Comment: (NOTE) Fact Sheet for Patients: bloggercourse.com  Fact Sheet for Healthcare Providers: seriousbroker.it  This test is not yet approved or cleared by the [place]  FDA and has been authorized for detection and/or diagnosis of SARS-CoV-2 by FDA under an Emergency Use Authorization (EUA). This EUA will remain in effect (meaning this test can be used) for the duration of the COVID-19 declaration under Section 564(b)(1) of the Act, 21 U.S.C. section 360bbb-3(b)(1), unless the authorization is terminated or revoked.  Performed at  [facility], [address] [place], [place] [address]   Respiratory (~20 pathogens) panel by PCR     Status: None   Collection Time: 04/19/23 12:38 PM   Specimen: Nasopharyngeal Swab; Respiratory  Result Value Ref Range Status   Adenovirus NOT DETECTED NOT DETECTED Final   Coronavirus 229E NOT DETECTED NOT DETECTED Final    Comment: (NOTE) The Coronavirus on the Respiratory Panel, DOES NOT test for the novel  Coronavirus (2019 nCoV)    Coronavirus HKU1 NOT DETECTED NOT DETECTED Final   Coronavirus NL63 NOT DETECTED NOT DETECTED Final   Coronavirus OC43 NOT DETECTED NOT DETECTED Final   Metapneumovirus NOT DETECTED NOT DETECTED Final   Rhinovirus / Enterovirus NOT DETECTED NOT DETECTED Final   Influenza A NOT DETECTED NOT DETECTED Final   Influenza B NOT DETECTED NOT DETECTED Final   Parainfluenza Virus 1 NOT DETECTED NOT DETECTED Final   Parainfluenza Virus 2 NOT DETECTED NOT DETECTED Final   Parainfluenza Virus 3 NOT DETECTED NOT DETECTED Final   Parainfluenza Virus 4 NOT DETECTED  NOT DETECTED Final   Respiratory Syncytial Virus NOT DETECTED NOT DETECTED Final   Bordetella pertussis NOT DETECTED NOT DETECTED Final   Bordetella Parapertussis NOT DETECTED NOT DETECTED Final   Chlamydophila pneumoniae NOT DETECTED NOT DETECTED Final   Mycoplasma pneumoniae NOT DETECTED NOT DETECTED Final    Comment: Performed at [facility], [address] [place], [place] [address]  MRSA Next Gen by PCR, Nasal     Status: None   Collection Time: 04/20/23  2:14 PM   Specimen: Nasal Mucosa; Nasal Swab  Result Value Ref Range Status   MRSA by PCR Next Gen NOT DETECTED NOT DETECTED Final    Comment: (NOTE) The GeneXpert MRSA Assay (FDA approved for NASAL specimens only), is one component of a comprehensive MRSA colonization surveillance program. It is not intended to diagnose MRSA infection nor to guide or monitor treatment for MRSA infections. Test performance is not FDA approved  in patients less than 57 years old. Performed at [facility], [address] [place], [place] [address]      Labs: Basic Metabolic Panel: Recent Labs  Lab 04/19/23 0902 04/19/23 1019 04/19/23 1143 04/20/23 0308 04/21/23 0437 04/22/23 0437 04/23/23 0813  NA 138 138  --  135 140 139 138  K 3.9 4.0  --  4.5 3.7 3.9 3.5  CL 97*  --   --  94* 93* 95* 90*  CO2 28  --   --  28 36* 36* 36*  GLUCOSE 154*  --   --  256* 116* 116* 152*  BUN 22*  --   --  23* 25* 22* 23*  CREATININE 1.23  --   --  1.10 1.11 0.88 0.98  CALCIUM  8.0*  --   --  8.1* 8.3* 8.7* 9.0  MG  --   --  3.3*  --   --   --   --    Liver Function Tests: Recent Labs  Lab 04/20/23 0308 04/21/23 0437  AST 23 24  ALT 34 33  ALKPHOS 56 52  BILITOT 0.8 0.7  PROT 6.8 6.3*  ALBUMIN 3.4* 3.1*   No results for input(s): LIPASE, AMYLASE in the last 168 hours. No results for input(s): AMMONIA in the last 168 hours. CBC: Recent Labs  Lab 04/19/23 0902 04/19/23 1019 04/20/23 0308  WBC 7.3  --  7.4  HGB 14.0 14.6 14.6  HCT 43.2 43.0 45.8  MCV 100.2*  --  102.7*  PLT 142*  --  140*   Cardiac Enzymes: No results for input(s): CKTOTAL, CKMB, CKMBINDEX, TROPONINI in the last 168 hours. BNP: BNP (last 3 results) Recent Labs    04/19/23 0902  BNP 320.4*    ProBNP (last 3 results) No results for input(s): PROBNP in the last 8760 hours.  CBG: Recent Labs  Lab 04/22/23 0744 04/22/23 1228 04/22/23 1559 04/22/23 2053 04/23/23 0819  GLUCAP 135* 154* 105* 123* 163*       Signed:  Sigurd Pac MD.  [facility] 04/23/2023, 10:22 AM

## 2023-04-23 NOTE — Progress Notes (Signed)
 Pt ambulated on RA on hallway. Denied SOB. Maintained O2 sat 90-95%.

## 2023-04-23 NOTE — Plan of Care (Signed)
  Problem: Activity: Goal: Capacity to carry out activities will improve Outcome: Progressing   Problem: Cardiac: Goal: Ability to achieve and maintain adequate cardiopulmonary perfusion will improve Outcome: Progressing   Problem: Clinical Measurements: Goal: Respiratory complications will improve Outcome: Progressing Goal: Cardiovascular complication will be avoided Outcome: Progressing   Problem: Safety: Goal: Ability to remain free from injury will improve Outcome: Progressing   Problem: Respiratory: Goal: Ability to maintain adequate ventilation will improve Outcome: Progressing   Problem: Metabolic: Goal: Ability to maintain appropriate glucose levels will improve Outcome: Progressing

## 2023-04-24 ENCOUNTER — Telehealth: Payer: Self-pay

## 2023-04-24 NOTE — Transitions of Care (Post Inpatient/ED Visit) (Signed)
   04/24/2023  Name: IVERY NANNEY MRN: [id number] DOB: 20-Dec-1975  Today's TOC FU Call Status: Today's TOC FU Call Status:: Unsuccessful Call (1st Attempt) Unsuccessful Call (1st Attempt) Date: 04/24/23  Attempted to reach the patient regarding the most recent Inpatient/ED visit.  Follow Up Plan: Additional outreach attempts will be made to reach the patient to complete the Transitions of Care (Post Inpatient/ED visit) call.   Channing Larry, RN, BA, [facility], CRRN [facility] Coordinator, Transition of Care Ph # [phone]

## 2023-04-27 ENCOUNTER — Telehealth: Payer: Self-pay

## 2023-04-27 NOTE — Transitions of Care (Post Inpatient/ED Visit) (Signed)
   04/27/2023  Name: Kevin Cardenas MRN: [id number] DOB: 09-Aug-1975  Today's TOC FU Call Status: Today's TOC FU Call Status:: Unsuccessful Call (2nd Attempt) Unsuccessful Call (2nd Attempt) Date: 04/27/23  Attempted to reach the patient regarding the most recent Inpatient/ED visit.  Follow Up Plan: Additional outreach attempts will be made to reach the patient to complete the Transitions of Care (Post Inpatient/ED visit) call.   Channing Larry, RN, BA, [facility], CRRN [facility] Coordinator, Transition of Care Ph # [phone]

## 2023-04-28 ENCOUNTER — Telehealth: Payer: Self-pay

## 2023-04-28 NOTE — Transitions of Care (Post Inpatient/ED Visit) (Signed)
   04/28/2023  Name: [name] MRN: [id number] DOB: 02/25/1976  Today's TOC FU Call Status: Today's TOC FU Call Status:: Unsuccessful Call (3rd Attempt) Unsuccessful Call (3rd Attempt) Date: 04/28/23  Attempted to reach the patient regarding the most recent Inpatient/ED visit.  Follow Up Plan: No further outreach attempts will be made at this time. We have been unable to contact the patient.  Channing Larry, RN, BA, [facility], CRRN [facility] Coordinator, Transition of Care Ph # [phone]

## 2023-05-15 ENCOUNTER — Other Ambulatory Visit (HOSPITAL_COMMUNITY): Payer: Self-pay

## 2023-05-15 ENCOUNTER — Ambulatory Visit (HOSPITAL_COMMUNITY)
Admission: RE | Admit: 2023-05-15 | Discharge: 2023-05-15 | Disposition: A | Discharge status: Home / Self Care | Payer: 59 | Source: Ambulatory Visit | Attending: Cardiology | Admitting: Cardiology

## 2023-05-15 ENCOUNTER — Encounter (HOSPITAL_COMMUNITY): Payer: Self-pay | Admitting: Cardiology

## 2023-05-15 VITALS — BP 112/80 | HR 99 | Wt 243.0 lb

## 2023-05-15 DIAGNOSIS — Z8774 Personal history of (corrected) congenital malformations of heart and circulatory system: Secondary | ICD-10-CM | POA: Insufficient documentation

## 2023-05-15 DIAGNOSIS — E785 Hyperlipidemia, unspecified: Secondary | ICD-10-CM | POA: Diagnosis not present

## 2023-05-15 DIAGNOSIS — Z8619 Personal history of other infectious and parasitic diseases: Secondary | ICD-10-CM | POA: Insufficient documentation

## 2023-05-15 DIAGNOSIS — I452 Bifascicular block: Secondary | ICD-10-CM | POA: Insufficient documentation

## 2023-05-15 DIAGNOSIS — I110 Hypertensive heart disease with heart failure: Secondary | ICD-10-CM | POA: Diagnosis not present

## 2023-05-15 DIAGNOSIS — Z7984 Long term (current) use of oral hypoglycemic drugs: Secondary | ICD-10-CM | POA: Insufficient documentation

## 2023-05-15 DIAGNOSIS — E119 Type 2 diabetes mellitus without complications: Secondary | ICD-10-CM | POA: Diagnosis not present

## 2023-05-15 DIAGNOSIS — Z79899 Other long term (current) drug therapy: Secondary | ICD-10-CM | POA: Insufficient documentation

## 2023-05-15 DIAGNOSIS — I5022 Chronic systolic (congestive) heart failure: Secondary | ICD-10-CM | POA: Diagnosis present

## 2023-05-15 DIAGNOSIS — I484 Atypical atrial flutter: Secondary | ICD-10-CM | POA: Diagnosis not present

## 2023-05-15 DIAGNOSIS — G4733 Obstructive sleep apnea (adult) (pediatric): Secondary | ICD-10-CM | POA: Insufficient documentation

## 2023-05-15 DIAGNOSIS — I4892 Unspecified atrial flutter: Secondary | ICD-10-CM

## 2023-05-15 DIAGNOSIS — Z91148 Patient's other noncompliance with medication regimen for other reason: Secondary | ICD-10-CM | POA: Insufficient documentation

## 2023-05-15 DIAGNOSIS — I428 Other cardiomyopathies: Secondary | ICD-10-CM | POA: Diagnosis not present

## 2023-05-15 DIAGNOSIS — Z87891 Personal history of nicotine dependence: Secondary | ICD-10-CM | POA: Insufficient documentation

## 2023-05-15 DIAGNOSIS — Z91198 Patient's noncompliance with other medical treatment and regimen for other reason: Secondary | ICD-10-CM | POA: Diagnosis not present

## 2023-05-15 LAB — BRAIN NATRIURETIC PEPTIDE: B Natriuretic Peptide: 112.2 pg/mL — ABNORMAL HIGH (ref 0.0–100.0)

## 2023-05-15 LAB — BASIC METABOLIC PANEL
Anion gap: 10 (ref 5–15)
BUN: 22 mg/dL — ABNORMAL HIGH (ref 6–20)
CO2: 30 mmol/L (ref 22–32)
Calcium: 9 mg/dL (ref 8.9–10.3)
Chloride: 100 mmol/L (ref 98–111)
Creatinine, Ser: 1.26 mg/dL — ABNORMAL HIGH (ref 0.61–1.24)
GFR, Estimated: >60 mL/min (ref >60)
Glucose, Bld: 94 mg/dL (ref 70–99)
Potassium: 4.5 mmol/L (ref 3.5–5.1)
Sodium: 140 mmol/L (ref 135–145)

## 2023-05-15 LAB — LIPID PANEL
Cholesterol: 100 mg/dL (ref 0–200)
HDL: 38 mg/dL — ABNORMAL LOW (ref >40)
LDL Cholesterol: 50 mg/dL (ref 0–99)
Total CHOL/HDL Ratio: 2.6 {ratio}
Triglycerides: 60 mg/dL (ref <150)
VLDL: 12 mg/dL (ref 0–40)

## 2023-05-15 MED ORDER — EMPAGLIFLOZIN 10 MG PO TABS
10.0000 mg | ORAL_TABLET | Freq: Every day | ORAL | 11 refills | Status: AC
  Filled 2023-05-15 – 2023-05-18 (×2): qty 30, 30d supply, fill #0
  Filled 2023-06-15: qty 30, 30d supply, fill #1
  Filled 2023-06-19 – 2023-07-20 (×2): qty 30, 30d supply, fill #2
  Filled 2023-08-18: qty 30, 30d supply, fill #3
  Filled 2023-09-21: qty 30, 30d supply, fill #4
  Filled 2023-12-21: qty 30, 30d supply, fill #5
  Filled 2024-01-19: qty 30, 30d supply, fill #6
  Filled 2024-02-22: qty 30, 30d supply, fill #7
  Filled 2024-03-21: qty 30, 30d supply, fill #8
  Filled 2024-04-21: qty 30, 30d supply, fill #9

## 2023-05-15 MED ORDER — POTASSIUM CHLORIDE CRYS ER 20 MEQ PO TBCR
40.0000 meq | EXTENDED_RELEASE_TABLET | Freq: Every day | ORAL | 3 refills | Status: AC
  Filled 2023-05-15: qty 180, 90d supply, fill #0
  Filled 2023-08-18: qty 180, 90d supply, fill #1
  Filled 2023-11-23: qty 180, 90d supply, fill #2
  Filled 2024-02-22: qty 180, 90d supply, fill #3

## 2023-05-15 MED ORDER — ENTRESTO 24-26 MG PO TABS
1.0000 | ORAL_TABLET | Freq: Two times a day (BID) | ORAL | 11 refills | Status: DC
  Filled 2023-05-15: qty 60, 30d supply, fill #0

## 2023-05-15 MED ORDER — SPIRONOLACTONE 25 MG PO TABS
25.0000 mg | ORAL_TABLET | Freq: Every day | ORAL | 3 refills | Status: AC
  Filled 2023-05-15 – 2023-06-15 (×2): qty 90, 90d supply, fill #0
  Filled 2023-09-21: qty 90, 90d supply, fill #1
  Filled 2023-12-21: qty 90, 90d supply, fill #2
  Filled 2024-03-21: qty 90, 90d supply, fill #3

## 2023-05-15 MED ORDER — CARVEDILOL 6.25 MG PO TABS
6.2500 mg | ORAL_TABLET | Freq: Two times a day (BID) | ORAL | 3 refills | Status: AC
  Filled 2023-05-15: qty 180, 90d supply, fill #0
  Filled 2023-08-18: qty 180, 90d supply, fill #1
  Filled 2023-11-23: qty 180, 90d supply, fill #2
  Filled 2024-03-21: qty 180, 90d supply, fill #3

## 2023-05-15 MED ORDER — TORSEMIDE 20 MG PO TABS
50.0000 mg | ORAL_TABLET | Freq: Every day | ORAL | 1 refills | Status: DC
  Filled 2023-05-15: qty 60, 24d supply, fill #0

## 2023-05-15 MED ORDER — APIXABAN 5 MG PO TABS
5.0000 mg | ORAL_TABLET | Freq: Two times a day (BID) | ORAL | 3 refills | Status: AC
Start: 2023-05-15 — End: ?
  Filled 2023-05-15: qty 180, 90d supply, fill #0
  Filled 2023-08-18: qty 180, 90d supply, fill #1
  Filled 2023-11-23: qty 180, 90d supply, fill #2
  Filled 2024-02-22: qty 180, 90d supply, fill #3

## 2023-05-15 NOTE — Patient Instructions (Addendum)
START Eliquis 5 mg Twice daily  START Entresto 24/26 mg Twice daily  CHANGE Torsemide to 60 mg daily, alternating with 40 mg daily.  You are scheduled for a TEE (Transesophageal Echocardiogram) Guided Cardioversion on Tuesday, February 18 with Dr. Shirlee Latch.  Please arrive at the [facility] (Main Entrance A) at [facility]: [address] [place], [place] [address] at 7:00 AM (This time is 1.5 hour(s) before your procedure to ensure your preparation).   Free valet parking service is available. You will check in at ADMITTING.   *Please Note: You will receive a call the day before your procedure to confirm the appointment time. That time may have changed from the original time based on the schedule for that day.*    DIET:  Nothing to eat or drink after midnight except a sip of water with medications (see medication instructions below)  MEDICATION INSTRUCTIONS:  HOLD YOUR TORSEMIDE AND SPIRONOLACTONE THE MORNING TO THE PROCEDURE         :HOLD: Empagliflozin (Jardiance) for 3 days prior to the procedure. Last dose on Friday, February 14.   Continue taking your anticoagulant (blood thinner): Apixaban (Eliquis).  You will need to continue this after your procedure until you are told by your provider that it is safe to stop.    FYI:  For your safety, and to allow [place] to monitor your vital signs accurately during the surgery/procedure we request: If you have artificial nails, gel coating, SNS etc, please have those removed prior to your surgery/procedure. Not having the nail coverings /polish removed may result in cancellation or delay of your surgery/procedure.  Your support person will be asked to wait in the waiting room during your procedure.  It is OK to have someone drop you off and come back when you are ready to be discharged.  You cannot drive after the procedure and will need someone to drive you home.  Bring your insurance cards.  *Special Note: Every effort is made to have  your procedure done on time. Occasionally there are emergencies that occur at the hospital that may cause delays. Please be patient if a delay does occur.   You have been referred to the Electrophysiologist. They will call you to arrange you appointment.   If you have any questions or concerns before your next appointment please send [place] a message through Carlos or call our office at [phone].    TO LEAVE A MESSAGE FOR THE NURSE SELECT OPTION 2, PLEASE LEAVE A MESSAGE INCLUDING: YOUR NAME DATE OF BIRTH CALL BACK NUMBER REASON FOR CALL**this is important as we prioritize the call backs  YOU WILL RECEIVE A CALL BACK THE SAME DAY AS LONG AS YOU CALL BEFORE 4:00 PM  At the [facility], you and your health needs are our priority. As part of our continuing mission to provide you with exceptional heart care, we have created designated Provider Care Teams. These Care Teams include your primary Cardiologist (physician) and Advanced Practice Providers (APPs- Physician Assistants and Nurse Practitioners) who all work together to provide you with the care you need, when you need it.   You may see any of the following providers on your designated Care Team at your next follow up: Dr Arvilla Meres Dr Marca Ancona Dr. Dorthula Nettles Dr. Clearnce Hasten Amy Filbert Schilder, NP Robbie Lis, [place] [facility] [place], [place] Brynda Peon, NP [place] Lee, NP Karle Plumber, PharmD   Please be sure to bring in all your medications  bottles to every appointment.    Thank you for choosing [facility]

## 2023-05-17 NOTE — Progress Notes (Signed)
ID:  Kevin Cardenas, DOB 06-30-1975, MRN [id number]   Provider location: [facility] Type of Visit: Established patient  PCP:  Etta Grandchild, MD  HF Cardiologist:  Dr. Shirlee Latch EP: Dr Elberta Fortis.   Chief complaint: CHF   HPI: Kevin Cardenas is a 48 y.o. male who has a history of VSD repair and removal of a pulmonary artery band at age 42, DM, HTN, systolic heart failure, NICM and OSA diagnosed in past but not on CPAP. Had [employer] Jude ICD (placed 01/2015). Removed ICD due to endocarditis.   Echo in 3/20 showed EF 25%, mild LV dilation, mildly decreased RV systolic function.   CPX in 10/20 showed moderate functional limitation primarily due to restrictive lung disease.  I had planned to get PFTs and a high resolution CT chest to followup on lung restriction but was never scheduled.   At last appointment, he had been off his meds for several months but supposedly had restarted them.  Weight was up 20 lbs and he was volume overloaded.  I stopped Lasix and started torsemide, kept other meds as ordered.  He came to the ER later in 7/21 with a bleeding varicose vein but was noted to be hypotensive with AKI.  He was thought to be over-diuresed.  Torsemide was cut back and Entresto was decreased.   Echo in 8/21 showed EF 30-35%, no significant residual VSD, normal RV.    Admitted 3/23 with a/c respiratory failure with hypoxia secondary to a/c CHF and noncompliance with CPAP for OSA/OHS. PICC line placed. Co-ox okay. He was enrolled in FASTR trial for diuresis. Diuresed 24 lb. FASTR trial discontinued on 03/11. Transitioned to torsemide 20 mg bid (prior home dose). GDMT titrated.  R/LHC on 03/13 with normal coronaries, normal filling pressures and normal CO. Mild pulmonary HTN. Referred to paramedicine. HH PT/OT recommended. Patient declined Home Health.  Re-admitted 5/23 with sepsis 2/2 right lower extremity cellulitis. Started on abx and IVF. Required NE and transferred  to ICU. Echo this admit showed EF 35% w/ global HK, no residual VSD, RV normal w/ mildly elevated RVSP, 36 mmHG, trivial MR/TR, AoV not well visualized, no vegetations noted. AHF consulted, underwent TEE showing 0.5 x 0.9 cm vegetation on the ICD lead, no valvular vegetation noted. Subsequently had ICD extraction, with plans for IV cefazolin via PICC x 6 weeks. LifeVest placed and discharged home, weight 231 lbs.  Cardiac MRI 9/23 showed improved LVEF 48%, RVEF 40%, nonspecific inferior RV insertion site LGE (suggestive of volume overload), no finding suggestive of cardiac amyloidosis.  Patient was again lost to followup in CHF clinic.  He was hospitalized in 1/25 with CHF exacerbation. He had been out of several medications (he is not sure which ones).  Review of ECGs in the hospital showed that he was in atrial flutter.  This is a new finding and likely triggered his HF exacerbation.  Echo in 1/25 showed EF 35-40%, mild LVH, moderate RV dysfunction, mild RV enlargement, PASP 57 mmHg, IVC dilated.   Today he returns for HF follow up.  He is taking all his medications.  He remains in atrial flutter today though he does not feel palpitations.  He is short of breath with longer walks and walking up stairs.  No lightheadedness.  No chest pain.  No orthopnea/PND.   ECG (personally reviewed): Atypical atrial flutter with rate 104, RBBB, LAFB   Labs (1/16): K 4.1, creatinine  0.9, TSH normal, LDL 89, HDL 46 Labs (2/16): K 4.1 Creatinine 0.97  Labs (3/16): BNP 54, K 4.4, creatinine 1.0 Labs 5/16): K 4.0 Creatinine 0.93  Labs (6/16): digoxin < 0.2 Labs (9/16): K 3.7, creatinine 0.93, digoxin < 0.5, HCT 40.4 Labs (10/17): dig 0.5 K 4.1 Creatinine 0.94  Labs (6/19): K 4.3 Creatinine 0.95 Labs (3/20): K 4.3, creatinine 1.02, digoxin < 0.2, LDL 79 Labs (9/20): K 3.8, creatinine 0.82, digoxin < 0.2 Labs (7/21): K 3.8, creatinine 1.0 => 2.34 => 1.08, BNP 81, TSH normal Labs (9/21): digoxin 0.5, K 4.3,  creatinine 1.12 Labs (5/22): digoxin 0.2, K 4, creatinine 1.06 Labs (4/23): K 3.8, creatinine 1.10 Labs (5/23): K 3.9, creatinine 1.12 Labs (7/23): K 4.2, creatinine 1.55, hgb 12 Labs (8/23): K 4.2, creatinine 1.16 Labs (1/25): K 3.5, creatinine 0.98    PMH: 1. VSD repair at 1.  2. Type 2 diabetes. 3. HTN 4. OSA: Not using CPAP.  5. Chronic systolic CHF: Nonischemic cardiomyopathy.  St Jude ICD.  - Echo 2009: EF 35-40% - LHC 2009: No angiographic coronary disease.  - Echo 9/11: LVEF 20-25%  - Echo 11/12: LVEF 30-35%, diffuse hypokinesis and mild LVH. RV systolic mildly reduced. LA mildly dilated. Pulmonic valve with mild stenosis.  - Echo 05/12/2014: EF 15-20% Grade IDD, LV moderately dilated.  RV normal. Mild pulmonc stenosis mean peak gradient 14.  - Echo 7/16: EF 25-30%, mild LV dilation, mild LVH, trivial pulmonic stenosis with peak gradient 11 mmHg, mildly decreased RV function with mild RV dilation.   - CPX (3/16): RER 1.03, peak VO2 19.9, VE/VCO2 slope 18, mild to moderately impaired functional capacity.  There was moderate to severe mixed restrictive/obstructive pattern on spirometry => exertional hypoxemia, oxygen saturation decreased to 84%.  - RHC (4/16): mean RA 9, PA 35/12, mean PCWP 8, CI 1.8 Fick, CI 2.4 thermo.  - Echo 02/2106: EF 30-35% Grade IIDD  - Echo 3/20: EF 25%, mild LV dilation, diffuse hypokinesis, mildly decreased RV systolic function with mild RV dilation, no definite PS noted.  - CPX (10/20): VE/VCO2 16, peak VO2 15.8 (57% predicted), RER 1.08.  Moderate functional limitation primarily due to restrictive lung disease.  Severe restriction on PFTs.  - Echo (8/21): EF 30-35%, no significant residual VSD, normal RV.  - LHC/RHC (3/23): normal coronaries; mean RA 6, PA 47/27, mean PCWP 14, CI 3.28 - Echo (5/23): EF 35% w/ global HK, no residual VSD, RV normal w/ mildly elevated RVSP, 36 mmHG. Trival MR/TR. AoV not well visualized. No vegetations noted. - TEE (5/23):  EF 35-40%, global hypokinesis, ICD vegetation, mild PS peak gradient 13 mmHg.  - Cardiac MRI (9/23):  LVEF 48%, RVEF 40%, nonspecific inferior RV insertion site LGE (suggestive of volume overload), no finding suggestive of cardia amyloidosis. - Echo (1/25): EF 35-40%, mild LVH, moderate RV dysfunction, mild RV enlargement, PASP 57 mmHg, IVC dilated.  6. Pulmonic stenosis: Mild, peak gradient 13 mmHg on TEE in 5/23.  7. High resolution CT chest in 8/21: No evidence for ILD.  8. MSSA  bacteremia: TEE 09/04/21 showed 0.5 x 0.9 cm vegetation on the ICD lead as it traverses the RA. No valvular vegetation noted. - s/p ICD extraction 5/23. 9. Atrial flutter: Diagnosed in 1/25.   Current Outpatient Medications  Medication Sig Dispense Refill   albuterol (VENTOLIN HFA) 108 (90 Base) MCG/ACT inhaler Inhale 1-2 puffs into the lungs every 1 - 2 hours for up to 3 doses, then every 2 to 4  hours, as needed for shortness of breath. Contact PCP if shortness of breath persists after 6 puffs. 6.7 g 3   apixaban (ELIQUIS) 5 MG TABS tablet Take 1 tablet (5 mg total) by mouth 2 (two) times daily. 180 tablet 3   ascorbic acid (VITAMIN C) 500 MG tablet Take 500 mg by mouth daily.     cholecalciferol (VITAMIN D3) 25 MCG (1000 UNIT) tablet Take 1 tablet (25 mcg total) by mouth daily for Vitamin D deficiency 90 tablet 3   fluticasone furoate-vilanterol (BREO ELLIPTA) 100-25 MCG/ACT AEPB Inhale 1 puff into the lungs daily. 60 each 11   sacubitril-valsartan (ENTRESTO) 24-26 MG Take 1 tablet by mouth 2 (two) times daily. 60 tablet 11   simvastatin (ZOCOR) 20 MG tablet Take 1 tablet (20 mg total) by mouth every evening for hyperlipidemia. 60 tablet 1   carvedilol (COREG) 6.25 MG tablet Take 1 tablet (6.25 mg total) by mouth 2 (two) times daily with a meal. 180 tablet 3   empagliflozin (JARDIANCE) 10 MG TABS tablet Take 1 tablet (10 mg total) by mouth daily. 30 tablet 11   potassium chloride SA (KLOR-CON M) 20 MEQ tablet Take 2  tablets (40 mEq total) by mouth daily. 180 tablet 3   spironolactone (ALDACTONE) 25 MG tablet Take 1 tablet (25 mg total) by mouth daily. 90 tablet 3   torsemide (DEMADEX) 20 MG tablet Take 3 tablets (60 mg) alternating with 2 tablets (40 mg) by mouth every other day. 60 tablet 1   No current facility-administered medications for this encounter.    Allergies:   Metolazone   Social History:  The patient  reports that he quit smoking about 12 years ago. His smoking use included cigarettes. He started smoking about 27 years ago. He has a 4.5 pack-year smoking history. He has never used smokeless tobacco. He reports that he does not drink alcohol and does not use drugs.   Family History:  The patient's family history includes Diabetes in his maternal grandfather and paternal grandmother; Lung cancer in his father; Stroke in his father and mother.   ROS:  Please see the history of present illness.   All other systems are personally reviewed and negative.  Recent Labs: 04/19/2023: Magnesium 3.3 04/20/2023: Hemoglobin 14.6; Platelets 140 04/21/2023: ALT 33 05/15/2023: B Natriuretic Peptide 112.2; BUN 22; Creatinine, Ser 1.26; Potassium 4.5; Sodium 140  Personally reviewed   Wt Readings from Last 3 Encounters:  05/15/23 110.2 kg (243 lb)  04/23/23 109 kg (240 lb 4.8 oz)  04/21/22 109.2 kg (240 lb 12.8 oz)   BP 112/80   Pulse 99   Wt 110.2 kg (243 lb)   SpO2 94%   BMI 39.22 kg/m   Physical Exam General: NAD Neck: JVP 8-9 cm with HJR, no thyromegaly or thyroid nodule.  Lungs: Clear to auscultation bilaterally with normal respiratory effort. CV: Nondisplaced PMI.  Heart mildly tachy, regular S1/S2, no S3/S4, 2/6 SEM RUSB.  Trace ankle edema.  No carotid bruit.  Normal pedal pulses.  Abdomen: Soft, nontender, no hepatosplenomegaly, no distention.  Skin: Intact without lesions or rashes.  Neurologic: Alert and oriented x 3.  Psych: Normal affect. Extremities: No clubbing or cyanosis.  HEENT:  Normal.   Assessment/Plan 1. Chronic systolic CHF: Nonischemic cardiomyopathy.  RHC in 4/16 showed preserved cardiac index by thermodilution but low by Fick.   Echo in 8/21 showed EF 30-35%, normal RV, no residual VSD, no definite pulmonic stenosis.  Echo in 3/23 showed EF 30-35%, mild  LVH, no VSD, no significant pulmonary stenosis, moderate RVE with normal RV systolic function. LHC/RHC in 3/23 with no coronary disease, CI 3.28.  He had a [employer] ICD. Echo during recent admission (5/23), looked similar to 3/23.  TEE (5/23) showed LV EF 35-40%, mild RV dilation with moderate RV dysfunction, mild PS (mean gradient 13 mmHg), no VSD, there is a small 0.5 x 0.9 cm vegetation on the ICD lead as it traverses the RA, no valvular vegetation noted.  Now, s/p ICD extraction. Cardiac MRI (9/23) showed improved LVEF 48%, RVEF 40%. Echo in 1/25 showed EF 35-40%, mild LVH, moderate RV dysfunction, mild RV enlargement, PASP 57 mmHg, IVC dilated.  Admission in 1/25 likely triggered by both medication noncompliance and new atrial flutter.  NYHA II-III, mild volume overload on exam.  - Increase torsemide to 60 daily alternating with 40 daily. BMET/BNP today, BMET in 10 days.  - Continue Coreg 6.25 mg bid.  - Start Entresto 24/26 bid.  - Continue spironolactone 25 mg daily.  - Continue Jardiance 10 mg daily.  - Needs DCCV to NSR, see below.  2. H/o MSSA bacteremia: ? Source from RLE cellulitis. Had St Jude ICD => TEE 09/04/21 showed 0.5 x 0.9 cm vegetation on the ICD lead as it traverses the RA, no valvular vegetation noted. ICD extracted 09/05/21.   3. H/o VSD: No residual shunt noted 3/23 echo.  Last RHC also did not suggest any significant left to right shunting. TEE 5/23 showed no definite VSD.  4. Pulmonary stenosis: Mild on TEE in 5/23, peak gradient 13 mmHg.  5. OSA: Wears CPAP. 6. Atrial flutter: Atypical flutter today, also was present during admission earlier in 1/25.  Suspect this has triggered CHF exacerbation.   Needs to get back into NSR, HR elevated today.  - I will start apixaban 5 mg bid.  - I will arrange for TEE-guided DCCV next week. We discussed risks/benefits and he agrees to procedure.  7. Hyperlipidemia: Check lipids today.  8. Noncompliance: Discussed importance of taking meds and keeping followup.   Followup with APP after DCCV.   I spent 42 minutes reviewing records, interviewing/examining patient, and managing orders.    Marca Ancona  05/17/2023

## 2023-05-18 ENCOUNTER — Other Ambulatory Visit (HOSPITAL_COMMUNITY): Payer: Self-pay

## 2023-05-18 ENCOUNTER — Other Ambulatory Visit: Payer: Self-pay

## 2023-05-20 ENCOUNTER — Other Ambulatory Visit (HOSPITAL_COMMUNITY): Payer: Self-pay

## 2023-06-01 ENCOUNTER — Telehealth (HOSPITAL_COMMUNITY): Payer: Self-pay

## 2023-06-01 NOTE — Telephone Encounter (Signed)
Spoke to patient aware of time and place. Nothing to eat or drink after midnight. Holding Newborn as instructed. Tomorrow holding spironolactone and torsemide. Has transportation to and from procedure.

## 2023-06-01 NOTE — OR Nursing (Signed)
 Called patient with pre-procedure instructions for tomorrow.   Patient informed of:   Time to arrive for procedure. 0700 Remain NPO past midnight.  Must have a ride home and a responsible adult to remain with them for 24 hours post procedure.  Confirmed blood thinner. Confirmed no breaks in taking blood thinner for 3+ weeks prior to procedure. Confirmed patient stopped all GLP-1s and GLP-2s for at least one week before procedure.

## 2023-06-02 ENCOUNTER — Ambulatory Visit (HOSPITAL_COMMUNITY)
Admission: RE | Admit: 2023-06-02 | Discharge: 2023-06-02 | Disposition: A | Discharge status: Home / Self Care | Payer: 59 | Attending: Cardiology | Admitting: Cardiology

## 2023-06-02 ENCOUNTER — Encounter (HOSPITAL_COMMUNITY): Payer: Self-pay | Admitting: Anesthesiology

## 2023-06-02 ENCOUNTER — Encounter (HOSPITAL_COMMUNITY)
Admission: RE | Disposition: A | Discharge status: Home / Self Care | Payer: Self-pay | Source: Home / Self Care | Attending: Cardiology

## 2023-06-02 ENCOUNTER — Encounter (HOSPITAL_COMMUNITY): Payer: Self-pay | Admitting: Cardiology

## 2023-06-02 ENCOUNTER — Other Ambulatory Visit (HOSPITAL_COMMUNITY): Payer: 59

## 2023-06-02 ENCOUNTER — Other Ambulatory Visit: Payer: Self-pay

## 2023-06-02 DIAGNOSIS — I4892 Unspecified atrial flutter: Secondary | ICD-10-CM

## 2023-06-02 DIAGNOSIS — Z539 Procedure and treatment not carried out, unspecified reason: Secondary | ICD-10-CM | POA: Diagnosis not present

## 2023-06-02 DIAGNOSIS — I5023 Acute on chronic systolic (congestive) heart failure: Secondary | ICD-10-CM

## 2023-06-02 DIAGNOSIS — I5022 Chronic systolic (congestive) heart failure: Secondary | ICD-10-CM | POA: Diagnosis not present

## 2023-06-02 SURGERY — TRANSESOPHAGEAL ECHOCARDIOGRAM (TEE) (CATHLAB)
Anesthesia: Monitor Anesthesia Care

## 2023-06-02 NOTE — Progress Notes (Signed)
Spoke with Dr. Shirlee Latch and Dr Sampson Goon, both saw pt at bedside, will reschedule pt, pt with chest congestion, coughing, and wheezing, o2 sat at 89-90% on RA, pt encouraged to get checked out at urgent care/ER and take demadex when he gets home, Dr. Shirlee Latch will have office reach out to pt to reschedule procedure, pt made aware, verbal acknowledgement noted

## 2023-06-11 ENCOUNTER — Telehealth (HOSPITAL_COMMUNITY): Payer: Self-pay

## 2023-06-11 NOTE — Telephone Encounter (Signed)
 Pt returned call  Aware of appt details

## 2023-06-11 NOTE — Telephone Encounter (Signed)
 Called and left patient a voice message to confirm/remind patient of their appointment [facility] on [facility].   And to bring in all medications and/or complete list.

## 2023-06-11 NOTE — Progress Notes (Incomplete)
 ID:  Kevin Cardenas, DOB 1976-04-09, MRN [id number]   Provider location: [facility] Type of Visit: Established patient  PCP:  Georganna Skeans, MD EP: Dr Elberta Fortis.  HF Cardiologist:  Dr. Shirlee Latch   HPI: Kevin Cardenas is a 48 y.o. male who has a history of VSD repair and removal of a pulmonary artery band at age 20, DM, HTN, systolic heart failure, NICM and OSA diagnosed in past but not on CPAP. Had St Jude ICD (placed 01/2015). Removed ICD due to endocarditis.   Echo in 3/20 showed EF 25%, mild LV dilation, mildly decreased RV systolic function.   CPX in 10/20 showed moderate functional limitation primarily due to restrictive lung disease.  I had planned to get PFTs and a high resolution CT chest to followup on lung restriction but was never scheduled.   He came to the ER later in 7/21 with a bleeding varicose vein but was noted to be hypotensive with AKI.  He was thought to be over-diuresed.  Torsemide was cut back and Entresto was decreased.   Echo in 8/21 showed EF 30-35%, no significant residual VSD, normal RV.    Admitted 3/23 with a/c respiratory failure with hypoxia secondary to a/c CHF and noncompliance with CPAP for OSA/OHS. PICC line placed. Co-ox okay. He was enrolled in FASTR trial for diuresis. Diuresed 24 lb. FASTR trial discontinued on 03/11. Transitioned to torsemide 20 mg bid (prior home dose). GDMT titrated.  R/LHC on 03/13 with normal coronaries, normal filling pressures and normal CO. Mild pulmonary HTN. Referred to paramedicine. HH PT/OT recommended. Patient declined Home Health.  Re-admitted 5/23 with sepsis 2/2 right lower extremity cellulitis. Started on abx and IVF. Required NE and transferred to ICU. Echo this admit showed EF 35% w/ global HK, no residual VSD, RV normal w/ mildly elevated RVSP, 36 mmHG, trivial MR/TR, AoV not well visualized, no vegetations noted. AHF consulted, underwent TEE showing 0.5 x 0.9 cm vegetation on the ICD  lead, no valvular vegetation noted. Subsequently had ICD extraction, with plans for IV cefazolin via PICC x 6 weeks. LifeVest placed and discharged home, weight 231 lbs.  Cardiac MRI 9/23 showed improved LVEF 48%, RVEF 40%, nonspecific inferior RV insertion site LGE (suggestive of volume overload), no finding suggestive of cardiac amyloidosis.  Patient was again lost to followup in CHF clinic.    He was hospitalized in 1/25 with CHF exacerbation. He had been out of several medications (he is not sure which ones).  Review of ECGs in the hospital showed that he was in atrial flutter.  This is a new finding and likely triggered his HF exacerbation.  Echo in 1/25 showed EF 35-40%, mild LVH, moderate RV dysfunction, mild RV enlargement, PASP 57 mmHg, IVC dilated.   Found to be in AFL at follow up 1/25. Started on Eliquis, arranged for TEE-guided DCCV, however this was cancelled as he had URI symptoms and appeared to be back in NSR.  Today he returns for HF follow up. Overall feeling fine. He is not SOB walking up steps. Denies palpitations, abnormal bleeding, CP, dizziness, edema, or PND/Orthopnea. Appetite ok. No fever or chills. Weight at home 240 pounds. Taking all medications. No tobacco/ETOH or drug use. He wears CPAP at night.  ECG (personally reviewed): NSR 86 bpm  ReDs reading: 35%, normal  Labs (4/23): K 3.8, creatinine 1.10 Labs (5/23): K 3.9, creatinine 1.12 Labs (7/23): K 4.2, creatinine 1.55, hgb  12 Labs (8/23): K 4.2, creatinine 1.16 Labs (1/25): K 3.5, creatinine 0.98    PMH: 1. VSD repair at 1.  2. Type 2 diabetes. 3. HTN 4. OSA: Not using CPAP.  5. Chronic systolic CHF: Nonischemic cardiomyopathy.  St Jude ICD.  - Echo 2009: EF 35-40% - LHC 2009: No angiographic coronary disease.  - Echo 9/11: LVEF 20-25%  - Echo 11/12: LVEF 30-35%, diffuse hypokinesis and mild LVH. RV systolic mildly reduced. LA mildly dilated. Pulmonic valve with mild stenosis.  - Echo 05/12/2014: EF  15-20% Grade IDD, LV moderately dilated.  RV normal. Mild pulmonc stenosis mean peak gradient 14.  - Echo 7/16: EF 25-30%, mild LV dilation, mild LVH, trivial pulmonic stenosis with peak gradient 11 mmHg, mildly decreased RV function with mild RV dilation.   - CPX (3/16): RER 1.03, peak VO2 19.9, VE/VCO2 slope 18, mild to moderately impaired functional capacity.  There was moderate to severe mixed restrictive/obstructive pattern on spirometry => exertional hypoxemia, oxygen saturation decreased to 84%.  - RHC (4/16): mean RA 9, PA 35/12, mean PCWP 8, CI 1.8 Fick, CI 2.4 thermo.  - Echo 02/2106: EF 30-35% Grade IIDD  - Echo 3/20: EF 25%, mild LV dilation, diffuse hypokinesis, mildly decreased RV systolic function with mild RV dilation, no definite PS noted.  - CPX (10/20): VE/VCO2 16, peak VO2 15.8 (57% predicted), RER 1.08.  Moderate functional limitation primarily due to restrictive lung disease.  Severe restriction on PFTs.  - Echo (8/21): EF 30-35%, no significant residual VSD, normal RV.  - LHC/RHC (3/23): normal coronaries; mean RA 6, PA 47/27, mean PCWP 14, CI 3.28 - Echo (5/23): EF 35% w/ global HK, no residual VSD, RV normal w/ mildly elevated RVSP, 36 mmHG. Trival MR/TR. AoV not well visualized. No vegetations noted. - TEE (5/23): EF 35-40%, global hypokinesis, ICD vegetation, mild PS peak gradient 13 mmHg.  - Cardiac MRI (9/23):  LVEF 48%, RVEF 40%, nonspecific inferior RV insertion site LGE (suggestive of volume overload), no finding suggestive of cardia amyloidosis. - Echo (1/25): EF 35-40%, mild LVH, moderate RV dysfunction, mild RV enlargement, PASP 57 mmHg, IVC dilated.  6. Pulmonic stenosis: Mild, peak gradient 13 mmHg on TEE in 5/23.  7. High resolution CT chest in 8/21: No evidence for ILD.  8. MSSA  bacteremia: TEE 09/04/21 showed 0.5 x 0.9 cm vegetation on the ICD lead as it traverses the RA. No valvular vegetation noted. - s/p ICD extraction 5/23. 9. Atrial flutter: Diagnosed in  1/25.   Current Outpatient Medications  Medication Sig Dispense Refill   albuterol (VENTOLIN HFA) 108 (90 Base) MCG/ACT inhaler Inhale 1-2 puffs into the lungs every 1 - 2 hours for up to 3 doses, then every 2 to 4 hours, as needed for shortness of breath. Contact PCP if shortness of breath persists after 6 puffs. 6.7 g 3   apixaban (ELIQUIS) 5 MG TABS tablet Take 1 tablet (5 mg total) by mouth 2 (two) times daily. 180 tablet 3   ascorbic acid (VITAMIN C) 500 MG tablet Take 500 mg by mouth daily.     carvedilol (COREG) 6.25 MG tablet Take 1 tablet (6.25 mg total) by mouth 2 (two) times daily with a meal. 180 tablet 3   cholecalciferol (VITAMIN D3) 25 MCG (1000 UNIT) tablet Take 1 tablet (25 mcg total) by mouth daily for Vitamin D deficiency 90 tablet 3   empagliflozin (JARDIANCE) 10 MG TABS tablet Take 1 tablet (10 mg total) by mouth daily. 30  tablet 11   fluticasone furoate-vilanterol (BREO ELLIPTA) 100-25 MCG/ACT AEPB Inhale 1 puff into the lungs daily. 60 each 11   potassium chloride SA (KLOR-CON M) 20 MEQ tablet Take 2 tablets (40 mEq total) by mouth daily. 180 tablet 3   sacubitril-valsartan (ENTRESTO) 24-26 MG Take 1 tablet by mouth 2 (two) times daily. 60 tablet 11   simvastatin (ZOCOR) 20 MG tablet Take 1 tablet (20 mg total) by mouth every evening for hyperlipidemia. 60 tablet 1   spironolactone (ALDACTONE) 25 MG tablet Take 1 tablet (25 mg total) by mouth daily. 90 tablet 3   torsemide (DEMADEX) 20 MG tablet Take 3 tablets (60 mg) alternating with 2 tablets (40 mg) by mouth every other day. 60 tablet 1   No current facility-administered medications for this encounter.    Allergies:   Metolazone   Social History:  The patient  reports that he quit smoking about 12 years ago. His smoking use included cigarettes. He started smoking about 27 years ago. He has a 4.5 pack-year smoking history. He has never used smokeless tobacco. He reports that he does not drink alcohol and does not use  drugs.   Family History:  The patient's family history includes Diabetes in his maternal grandfather and paternal grandmother; Lung cancer in his father; Stroke in his father and mother.   ROS:  Please see the history of present illness.   All other systems are personally reviewed and negative.  Recent Labs: 04/19/2023: Magnesium 3.3 04/20/2023: Hemoglobin 14.6; Platelets 140 04/21/2023: ALT 33 05/15/2023: B Natriuretic Peptide 112.2; BUN 22; Creatinine, Ser 1.26; Potassium 4.5; Sodium 140  Personally reviewed   Wt Readings from Last 3 Encounters:  06/12/23 110.9 kg (244 lb 9.6 oz)  06/02/23 110 kg (242 lb 8.1 oz)  05/15/23 110.2 kg (243 lb)   BP 90/66   Pulse 88   Wt 110.9 kg (244 lb 9.6 oz)   SpO2 95%   BMI 39.48 kg/m   Physical Exam General:  NAD. No resp difficulty, walked into clinic HEENT: Normal Neck: Supple. No JVD. Cor: Regular rate & rhythm. No rubs, gallops, 2/6 SEM RUSB Lungs: Clear Abdomen: Soft, obese, nontender, nondistended.  Extremities: No cyanosis, clubbing, rash, edema Neuro: Alert & oriented x 3, moves all 4 extremities w/o difficulty. Affect pleasant.  Assessment/Plan 1. Chronic systolic CHF: Nonischemic cardiomyopathy.  RHC in 4/16 showed preserved cardiac index by thermodilution but low by Fick.   Echo in 8/21 showed EF 30-35%, normal RV, no residual VSD, no definite pulmonic stenosis.  Echo in 3/23 showed EF 30-35%, mild LVH, no VSD, no significant pulmonary stenosis, moderate RVE with normal RV systolic function. LHC/RHC in 3/23 with no coronary disease, CI 3.28.  He had a [employer] ICD. Echo during recent admission (5/23), looked similar to 3/23.  TEE (5/23) showed LV EF 35-40%, mild RV dilation with moderate RV dysfunction, mild PS (mean gradient 13 mmHg), no VSD, there is a small 0.5 x 0.9 cm vegetation on the ICD lead as it traverses the RA, no valvular vegetation noted.  Now, s/p ICD extraction. Cardiac MRI (9/23) showed improved LVEF 48%, RVEF 40%. Echo in  1/25 showed EF 35-40%, mild LVH, moderate RV dysfunction, mild RV enlargement, PASP 57 mmHg, IVC dilated.  Admission in 1/25 likely triggered by both medication noncompliance and new atrial flutter.  NYHA II-early III, mild volume overload on exam.  - BP low, stop Entresto and restart losartan 12.5 mg daily. - Increase torsemide to 60 daily.  BMET and BNP today; repeat BMET in 10-14 days - Continue Coreg 6.25 mg bid.  - Continue spironolactone 25 mg daily.  - Continue Jardiance 10 mg daily.  2. H/o MSSA bacteremia: ? Source from RLE cellulitis. Had St Jude ICD => TEE 09/04/21 showed 0.5 x 0.9 cm vegetation on the ICD lead as it traverses the RA, no valvular vegetation noted. ICD extracted 09/05/21.   3. H/o VSD: No residual shunt noted 3/23 echo.  Last RHC also did not suggest any significant left to right shunting. TEE 5/23 showed no definite VSD.  4. Pulmonary stenosis: Mild on TEE in 5/23, peak gradient 13 mmHg.  5. OSA: Wears CPAP. 6. Atrial flutter: Atypical flutter today, also was present during admission earlier in 1/25.  Suspect this has triggered CHF exacerbation.  He spontaneously converted to NSR, remains in SR today on ECG. - Continue apixaban 5 mg bid. CBC today 7. Hyperlipidemia: LDL 50 1/25 8. Noncompliance: Discussed importance of taking meds and keeping followup.   Follow up in 4-6 weeks with APP for BP and fluid check  [facility] FNP-BC 06/12/2023

## 2023-06-12 ENCOUNTER — Ambulatory Visit (HOSPITAL_COMMUNITY)
Admission: RE | Admit: 2023-06-12 | Discharge: 2023-06-12 | Disposition: A | Discharge status: Home / Self Care | Payer: 59 | Source: Ambulatory Visit | Attending: Family Medicine | Admitting: Family Medicine

## 2023-06-12 ENCOUNTER — Other Ambulatory Visit (HOSPITAL_COMMUNITY): Payer: Self-pay

## 2023-06-12 ENCOUNTER — Encounter (HOSPITAL_COMMUNITY): Payer: Self-pay

## 2023-06-12 VITALS — BP 90/66 | HR 88 | Wt 244.6 lb

## 2023-06-12 DIAGNOSIS — I484 Atypical atrial flutter: Secondary | ICD-10-CM | POA: Insufficient documentation

## 2023-06-12 DIAGNOSIS — I4892 Unspecified atrial flutter: Secondary | ICD-10-CM | POA: Diagnosis not present

## 2023-06-12 DIAGNOSIS — Z7901 Long term (current) use of anticoagulants: Secondary | ICD-10-CM | POA: Insufficient documentation

## 2023-06-12 DIAGNOSIS — E119 Type 2 diabetes mellitus without complications: Secondary | ICD-10-CM | POA: Diagnosis not present

## 2023-06-12 DIAGNOSIS — R7881 Bacteremia: Secondary | ICD-10-CM

## 2023-06-12 DIAGNOSIS — Z7984 Long term (current) use of oral hypoglycemic drugs: Secondary | ICD-10-CM | POA: Diagnosis not present

## 2023-06-12 DIAGNOSIS — E785 Hyperlipidemia, unspecified: Secondary | ICD-10-CM | POA: Insufficient documentation

## 2023-06-12 DIAGNOSIS — Z8774 Personal history of (corrected) congenital malformations of heart and circulatory system: Secondary | ICD-10-CM | POA: Diagnosis not present

## 2023-06-12 DIAGNOSIS — I5022 Chronic systolic (congestive) heart failure: Secondary | ICD-10-CM | POA: Insufficient documentation

## 2023-06-12 DIAGNOSIS — Q210 Ventricular septal defect: Secondary | ICD-10-CM | POA: Diagnosis not present

## 2023-06-12 DIAGNOSIS — I5020 Unspecified systolic (congestive) heart failure: Secondary | ICD-10-CM

## 2023-06-12 DIAGNOSIS — I110 Hypertensive heart disease with heart failure: Secondary | ICD-10-CM | POA: Diagnosis not present

## 2023-06-12 DIAGNOSIS — Z91199 Patient's noncompliance with other medical treatment and regimen due to unspecified reason: Secondary | ICD-10-CM

## 2023-06-12 DIAGNOSIS — Z91148 Patient's other noncompliance with medication regimen for other reason: Secondary | ICD-10-CM | POA: Diagnosis not present

## 2023-06-12 DIAGNOSIS — Z87891 Personal history of nicotine dependence: Secondary | ICD-10-CM | POA: Insufficient documentation

## 2023-06-12 DIAGNOSIS — I428 Other cardiomyopathies: Secondary | ICD-10-CM | POA: Insufficient documentation

## 2023-06-12 DIAGNOSIS — I452 Bifascicular block: Secondary | ICD-10-CM | POA: Diagnosis not present

## 2023-06-12 DIAGNOSIS — I370 Nonrheumatic pulmonary valve stenosis: Secondary | ICD-10-CM

## 2023-06-12 DIAGNOSIS — G4733 Obstructive sleep apnea (adult) (pediatric): Secondary | ICD-10-CM | POA: Insufficient documentation

## 2023-06-12 DIAGNOSIS — Z79899 Other long term (current) drug therapy: Secondary | ICD-10-CM | POA: Insufficient documentation

## 2023-06-12 DIAGNOSIS — Z8619 Personal history of other infectious and parasitic diseases: Secondary | ICD-10-CM | POA: Diagnosis not present

## 2023-06-12 DIAGNOSIS — B9561 Methicillin susceptible Staphylococcus aureus infection as the cause of diseases classified elsewhere: Secondary | ICD-10-CM

## 2023-06-12 LAB — BASIC METABOLIC PANEL
Anion gap: 10 (ref 5–15)
BUN: 15 mg/dL (ref 6–20)
CO2: 28 mmol/L (ref 22–32)
Calcium: 8.7 mg/dL — ABNORMAL LOW (ref 8.9–10.3)
Chloride: 99 mmol/L (ref 98–111)
Creatinine, Ser: 0.96 mg/dL (ref 0.61–1.24)
GFR, Estimated: >60 mL/min (ref >60)
Glucose, Bld: 107 mg/dL — ABNORMAL HIGH (ref 70–99)
Potassium: 4.2 mmol/L (ref 3.5–5.1)
Sodium: 137 mmol/L (ref 135–145)

## 2023-06-12 LAB — BRAIN NATRIURETIC PEPTIDE: B Natriuretic Peptide: 63 pg/mL (ref 0.0–100.0)

## 2023-06-12 MED ORDER — LOSARTAN POTASSIUM 25 MG PO TABS
12.5000 mg | ORAL_TABLET | Freq: Every day | ORAL | 3 refills | Status: AC
  Filled 2023-06-12: qty 45, 90d supply, fill #0
  Filled 2023-09-21: qty 45, 90d supply, fill #1
  Filled 2023-12-21: qty 45, 90d supply, fill #2
  Filled 2024-03-21: qty 45, 90d supply, fill #3

## 2023-06-12 MED ORDER — TORSEMIDE 20 MG PO TABS
60.0000 mg | ORAL_TABLET | Freq: Every day | ORAL | 3 refills | Status: DC
  Filled 2023-06-12: qty 90, 30d supply, fill #0
  Filled 2023-07-20: qty 90, 30d supply, fill #1
  Filled 2023-08-18: qty 90, 30d supply, fill #2
  Filled 2023-09-21: qty 90, 30d supply, fill #3

## 2023-06-12 NOTE — Patient Instructions (Signed)
 Medication Changes:  STOP TAKING ENTRESTO   START: LOSARTAN 12.5MG  ONCE DAILY   INCREASE TORSEMIDE TO 60MG  ONCE DAILY   Lab Work:  Labs done today, your results will be available in MyChart, we will contact you for abnormal readings.  THEN AGAIN IN 2 WEEKS AS SCHEDULED   Follow-Up in: 4-6 WEEKS AS SCHEDULED   At the [facility], you and your health needs are our priority. We have a designated team specialized in the treatment of Heart Failure. This Care Team includes your primary Heart Failure Specialized Cardiologist (physician), Advanced Practice Providers (APPs- Physician Assistants and Nurse Practitioners), and Pharmacist who all work together to provide you with the care you need, when you need it.   You may see any of the following providers on your designated Care Team at your next follow up:  Dr. Arvilla Meres Dr. Marca Ancona Dr. Dorthula Nettles Dr. Theresia Bough Tonye Becket, NP Robbie Lis, [place] [facility] [place], [place] Brynda Peon, NP [place] Lee, NP Karle Plumber, PharmD   Please be sure to bring in all your medications bottles to every appointment.   Need to Contact us:  If you have any questions or concerns before your next appointment please send [place] a message through [place] or call our office at [phone].    TO LEAVE A MESSAGE FOR THE NURSE SELECT OPTION 2, PLEASE LEAVE A MESSAGE INCLUDING: YOUR NAME DATE OF BIRTH CALL BACK NUMBER REASON FOR CALL**this is important as we prioritize the call backs  YOU WILL RECEIVE A CALL BACK THE SAME DAY AS LONG AS YOU CALL BEFORE 4:00 PM

## 2023-06-12 NOTE — Progress Notes (Signed)
 ReDS Vest / Clip - 06/12/23 1423       ReDS Vest / Clip   Station Marker B    Ruler Value 35    ReDS Value Range Low volume    ReDS Actual Value 35

## 2023-06-15 ENCOUNTER — Other Ambulatory Visit (HOSPITAL_COMMUNITY): Payer: Self-pay

## 2023-06-19 ENCOUNTER — Other Ambulatory Visit (HOSPITAL_COMMUNITY): Payer: Self-pay

## 2023-06-26 ENCOUNTER — Other Ambulatory Visit (HOSPITAL_COMMUNITY): Payer: 59

## 2023-07-13 ENCOUNTER — Other Ambulatory Visit (HOSPITAL_COMMUNITY): Payer: Self-pay

## 2023-07-13 ENCOUNTER — Other Ambulatory Visit: Payer: Self-pay

## 2023-07-13 MED ORDER — ANORO ELLIPTA 62.5-25 MCG/ACT IN AEPB
1.0000 | INHALATION_SPRAY | Freq: Every day | RESPIRATORY_TRACT | 3 refills | Status: AC
  Filled 2023-07-13: qty 180, 90d supply, fill #0
  Filled 2023-10-26: qty 180, 90d supply, fill #1
  Filled 2024-03-21: qty 180, 90d supply, fill #2

## 2023-07-20 ENCOUNTER — Other Ambulatory Visit (HOSPITAL_COMMUNITY): Payer: Self-pay

## 2023-07-24 ENCOUNTER — Inpatient Hospital Stay (HOSPITAL_COMMUNITY)
Admission: RE | Admit: 2023-07-24 | Discharge: 2023-07-24 | Disposition: A | Discharge status: Home / Self Care | Payer: 59 | Source: Ambulatory Visit

## 2023-08-09 NOTE — Progress Notes (Deleted)
  Electrophysiology Office Follow up Visit Note:    Date:  08/09/2023   ID:  DAMASO MENZE, DOB 21-Jun-1975, MRN [id number]  PCP:  Abraham Abo, MD  [facility] Cardiologist:  Peder Bourdon, MD  [facility] Electrophysiologist:  Boyce Byes, MD    Interval History:     NARVEL KATA is a 48 y.o. male who presents for a follow up visit.   I last saw the patient October 10, 2021 after his ICD was extracted Sep 06, 2021.  His device was removed secondary to infection.  We are originally planning for reimplant with a subcutaneous device if his EF remains less than 35%.  He follows with Dr. Mitzie Anda in the heart failure clinic and last saw him  February 28th 2025.  He was hospitalized in January of this year with a heart failure exacerbation in the setting of atrial flutter.  He was started on Eliquis .  At the appointment with Dr. McLain he was still in atrial flutter.       Past medical, surgical, social and family history were reviewed.  ROS:   Please see the history of present illness.    All other systems reviewed and are negative.  EKGs/Labs/Other Studies Reviewed:    The following studies were reviewed today: . April 20, 2023 echo EF 35-40 Global hypokinesis RV moderately reduced  June 12, 2023 EKG shows sinus rhythm, right bundle branch block, left anterior fascicular block  June 02, 2023 EKG shows sinus rhythm, right bundle branch block, left anterior fascicular block      Physical Exam:    VS:  There were no vitals taken for this visit.    Wt Readings from Last 3 Encounters:  06/12/23 244 lb 9.6 oz (110.9 kg)  06/02/23 242 lb 8.1 oz (110 kg)  05/15/23 243 lb (110.2 kg)     GEN: no distress CARD: RRR, No MRG RESP: No IWOB. CTAB.      ASSESSMENT:    No diagnosis found. PLAN:    In order of problems listed above:  #Abnormal EKGs The patient has prior EKG showing tachycardia that his in labeled as atrial flutter.  The EKG  diagnosis is not definitive.  I have recommended a 2-week ZIO monitor to further assess the burden of arrhythmia and to help guide future therapies. Continue Eliquis  for stroke prophylaxis  If the ZIO monitor is negative, I have recommended a loop recorder for further rhythm surveillance.  #Chronic systolic heart failure NYHA class II.  EF 35-40 most recent echo. Follows with Mitzie Anda in the heart failure clinic Continue torsemide , losartan , Jardiance , Coreg   Follow-up in 6 to 8 weeks with me.  Pre-CERT for loop recorder if ZIO monitor is unrevealing   Signed, Harvie Liner, MD, [facility], [facility] 08/09/2023 9:16 PM    Electrophysiology [facility]

## 2023-08-10 ENCOUNTER — Ambulatory Visit: Attending: Cardiology | Admitting: Cardiology

## 2023-08-18 ENCOUNTER — Other Ambulatory Visit: Payer: Self-pay

## 2023-08-18 ENCOUNTER — Other Ambulatory Visit (HOSPITAL_COMMUNITY): Payer: Self-pay

## 2023-08-28 ENCOUNTER — Ambulatory Visit: Attending: Cardiology | Admitting: Cardiology

## 2023-08-28 NOTE — Progress Notes (Deleted)
  Electrophysiology Office Follow up Visit Note:    Date:  08/28/2023   ID:  Kevin Cardenas, DOB 06/13/1975, MRN [id number]  PCP:  Kevin Abo, MD  [facility] Cardiologist:  Kevin Bourdon, MD  [facility] Electrophysiologist:  Kevin Byes, MD    Interval History:     Kevin Cardenas is a 48 y.o. male who presents for a follow up visit.   He was last seen by Kevin Cardenas March 10, 2022.  I last saw him in 2023.  He has a history of VSD repair and removal of a pulmonary artery band at age 11, diabetes, hypertension, chronic systolic heart failure, sleep apnea and an ICD.  His ICD was extracted in May 2023.  By cardiac MRI his EF had improved and LifeVest was discontinued.  He last saw the heart failure clinic June 12, 2023.  This was after a hospitalization in January for heart failure exacerbation.  EF during the hospitalization was 35 to 40%.  He was in atrial flutter.  He was started on Eliquis .  At the appointment with the heart failure clinic in February he was feeling well.  At the appointment he was in atypical atrial flutter      Past medical, surgical, social and family history were reviewed.  ROS:   Please see the history of present illness.    All other systems reviewed and are negative.  EKGs/Labs/Other Studies Reviewed:    The following studies were reviewed today:  June 12, 2023 EKG shows sinus rhythm, right bundle branch block, left anterior fascicular block        Physical Exam:    VS:  There were no vitals taken for this visit.    Wt Readings from Last 3 Encounters:  06/12/23 244 lb 9.6 oz (110.9 kg)  06/02/23 242 lb 8.1 oz (110 kg)  05/15/23 243 lb (110.2 kg)     GEN: no distress CARD: RRR, No MRG RESP: No IWOB. CTAB.      ASSESSMENT:    No diagnosis found. PLAN:    In order of problems listed above:  #Atrial flutter First observed during hospitalization for acutely decompensated heart failure.  On Eliquis  for  stroke prophylaxis. Continue Coreg  for now.    #Chronic systolic heart failure Follows with heart failure clinic.  NYHA class II-III.  Continue torsemide , spironolactone , losartan , Jardiance , Coreg   Follow-up with EP on an as-needed basis   Signed, Kevin Liner, MD, [facility] 08/28/2023 8:16 AM    Electrophysiology [facility]

## 2023-08-31 ENCOUNTER — Encounter: Payer: Self-pay | Admitting: Cardiology

## 2023-09-21 ENCOUNTER — Other Ambulatory Visit (HOSPITAL_COMMUNITY): Payer: Self-pay

## 2023-09-28 ENCOUNTER — Other Ambulatory Visit (HOSPITAL_COMMUNITY): Payer: Self-pay

## 2023-10-26 ENCOUNTER — Other Ambulatory Visit (HOSPITAL_COMMUNITY): Payer: Self-pay | Admitting: Family Medicine

## 2023-10-26 ENCOUNTER — Other Ambulatory Visit (HOSPITAL_COMMUNITY): Payer: Self-pay

## 2023-10-26 MED ORDER — TORSEMIDE 20 MG PO TABS
60.0000 mg | ORAL_TABLET | Freq: Every day | ORAL | 3 refills | Status: DC
  Filled 2023-10-26: qty 90, 30d supply, fill #0
  Filled 2023-11-23: qty 90, 30d supply, fill #1
  Filled 2023-12-21: qty 90, 30d supply, fill #2
  Filled 2024-01-19: qty 90, 30d supply, fill #3

## 2023-11-09 ENCOUNTER — Other Ambulatory Visit (HOSPITAL_COMMUNITY): Payer: Self-pay

## 2023-11-23 ENCOUNTER — Other Ambulatory Visit (HOSPITAL_COMMUNITY): Payer: Self-pay

## 2023-11-30 ENCOUNTER — Other Ambulatory Visit (HOSPITAL_COMMUNITY): Payer: Self-pay

## 2023-12-02 ENCOUNTER — Other Ambulatory Visit (HOSPITAL_COMMUNITY): Payer: Self-pay

## 2023-12-21 ENCOUNTER — Other Ambulatory Visit (HOSPITAL_COMMUNITY): Payer: Self-pay

## 2023-12-25 ENCOUNTER — Other Ambulatory Visit (HOSPITAL_COMMUNITY): Payer: Self-pay

## 2023-12-28 ENCOUNTER — Other Ambulatory Visit (HOSPITAL_COMMUNITY): Payer: Self-pay

## 2024-01-11 ENCOUNTER — Other Ambulatory Visit (HOSPITAL_COMMUNITY): Payer: Self-pay

## 2024-01-19 ENCOUNTER — Encounter (HOSPITAL_COMMUNITY): Payer: Self-pay

## 2024-01-19 ENCOUNTER — Other Ambulatory Visit: Payer: Self-pay | Admitting: Family Medicine

## 2024-01-19 ENCOUNTER — Other Ambulatory Visit (HOSPITAL_COMMUNITY): Payer: Self-pay

## 2024-01-26 ENCOUNTER — Other Ambulatory Visit (HOSPITAL_COMMUNITY): Payer: Self-pay

## 2024-01-26 MED ORDER — SIMVASTATIN 20 MG PO TABS
20.0000 mg | ORAL_TABLET | Freq: Every evening | ORAL | 3 refills | Status: DC
  Filled 2024-01-26 – 2024-02-22 (×2): qty 90, 90d supply, fill #0

## 2024-02-05 ENCOUNTER — Other Ambulatory Visit (HOSPITAL_COMMUNITY): Payer: Self-pay

## 2024-02-22 ENCOUNTER — Other Ambulatory Visit (HOSPITAL_COMMUNITY): Payer: Self-pay | Admitting: Family Medicine

## 2024-02-22 ENCOUNTER — Other Ambulatory Visit (HOSPITAL_COMMUNITY): Payer: Self-pay

## 2024-02-22 ENCOUNTER — Other Ambulatory Visit: Payer: Self-pay

## 2024-02-22 MED ORDER — TORSEMIDE 20 MG PO TABS
60.0000 mg | ORAL_TABLET | Freq: Every day | ORAL | 0 refills | Status: DC
  Filled 2024-02-22: qty 90, 30d supply, fill #0

## 2024-03-01 ENCOUNTER — Other Ambulatory Visit (HOSPITAL_COMMUNITY): Payer: Self-pay

## 2024-03-01 MED ORDER — ALBUTEROL SULFATE HFA 108 (90 BASE) MCG/ACT IN AERS
2.0000 | INHALATION_SPRAY | RESPIRATORY_TRACT | 3 refills | Status: AC | PRN
  Filled 2024-03-01: qty 20.1, 75d supply, fill #0

## 2024-03-11 ENCOUNTER — Other Ambulatory Visit (HOSPITAL_COMMUNITY): Payer: Self-pay

## 2024-03-21 ENCOUNTER — Other Ambulatory Visit (HOSPITAL_COMMUNITY): Payer: Self-pay | Admitting: Family Medicine

## 2024-03-21 ENCOUNTER — Other Ambulatory Visit (HOSPITAL_COMMUNITY): Payer: Self-pay

## 2024-03-21 ENCOUNTER — Other Ambulatory Visit: Payer: Self-pay

## 2024-03-21 MED ORDER — TORSEMIDE 20 MG PO TABS
60.0000 mg | ORAL_TABLET | Freq: Every day | ORAL | 0 refills | Status: DC
  Filled 2024-03-21: qty 90, 30d supply, fill #0

## 2024-03-22 ENCOUNTER — Other Ambulatory Visit (HOSPITAL_COMMUNITY): Payer: Self-pay

## 2024-03-22 MED ORDER — HYDROCODONE-ACETAMINOPHEN 5-325 MG PO TABS
1.0000 | ORAL_TABLET | Freq: Four times a day (QID) | ORAL | 0 refills | Status: AC
  Filled 2024-03-22: qty 20, 5d supply, fill #0

## 2024-03-29 ENCOUNTER — Other Ambulatory Visit (HOSPITAL_COMMUNITY): Payer: Self-pay

## 2024-04-21 ENCOUNTER — Other Ambulatory Visit (HOSPITAL_COMMUNITY): Payer: Self-pay

## 2024-04-21 ENCOUNTER — Other Ambulatory Visit (HOSPITAL_COMMUNITY): Payer: Self-pay | Admitting: Family Medicine

## 2024-04-21 ENCOUNTER — Other Ambulatory Visit: Payer: Self-pay

## 2024-04-21 MED ORDER — TORSEMIDE 20 MG PO TABS
60.0000 mg | ORAL_TABLET | Freq: Every day | ORAL | 0 refills | Status: AC
  Filled 2024-04-21: qty 90, 30d supply, fill #0

## 2024-04-29 ENCOUNTER — Telehealth (HOSPITAL_COMMUNITY): Payer: Self-pay

## 2024-04-29 NOTE — Telephone Encounter (Signed)
 Called to confirm/remind patient of their appointment at the [facility] on 05/02/24.   Appointment:   [] Confirmed  [x] Left mess   [] No answer/No voice mail  [] VM Full/unable to leave message  [] Phone not in service  And to bring in all medications and/or complete list.

## 2024-05-01 NOTE — Progress Notes (Signed)
 "          ID:  Kevin Cardenas, DOB Oct 21, 1975, MRN [id number]   Provider location: [facility] Type of Visit: Established patient  PCP:  [name], MD EP: Dr Inocencio.  HF Cardiologist:  Dr. Rolan   HPI: Kevin Cardenas is a 49 y.o. male who has a history of VSD repair and removal of a pulmonary artery band at age 52, DM, HTN, systolic heart failure, NICM and OSA diagnosed in past but not on CPAP. Had [employer] Jude ICD (placed 01/2015). Removed ICD due to endocarditis.   Echo in 3/20 showed EF 25%, mild LV dilation, mildly decreased RV systolic function.   CPX in 10/20 showed moderate functional limitation primarily due to restrictive lung disease.  I had planned to get PFTs and a high resolution CT chest to followup on lung restriction but was never scheduled.   He came to the ER later in 7/21 with a bleeding varicose vein but was noted to be hypotensive with AKI.  He was thought to be over-diuresed.  Torsemide  was cut back and Entresto  was decreased.   Echo in 8/21 showed EF 30-35%, no significant residual VSD, normal RV.    Admitted 3/23 with a/c respiratory failure with hypoxia secondary to a/c CHF and noncompliance with CPAP for OSA/OHS. PICC line placed. Co-ox okay. He was enrolled in FASTR trial for diuresis. Diuresed 24 lb. FASTR trial discontinued on 03/11. Transitioned to torsemide  20 mg bid (prior home dose). GDMT titrated.  R/LHC on 03/13 with normal coronaries, normal filling pressures and normal CO. Mild pulmonary HTN. Referred to paramedicine. HH PT/OT recommended. Patient declined Home Health.  Re-admitted 5/23 with sepsis 2/2 right lower extremity cellulitis. Started on abx and IVF. Required NE and transferred to ICU. Echo this admit showed EF 35% w/ global HK, no residual VSD, RV normal w/ mildly elevated RVSP, 36 mmHG, trivial MR/TR, AoV not well visualized, no vegetations noted. AHF consulted, underwent TEE showing 0.5 x 0.9 cm vegetation on the ICD  lead, no valvular vegetation noted. Subsequently had ICD extraction, with plans for IV cefazolin  via PICC x 6 weeks. LifeVest placed and discharged home, weight 231 lbs.  Cardiac MRI 9/23 showed improved LVEF 48%, RVEF 40%, nonspecific inferior RV insertion site LGE (suggestive of volume overload), no finding suggestive of cardiac amyloidosis.  Patient was again lost to followup in CHF clinic.    He was hospitalized in 1/25 with CHF exacerbation. He had been out of several medications (he is not sure which ones).  Review of ECGs in the hospital showed that he was in atrial flutter.  This is a new finding and likely triggered his HF exacerbation.  Echo in 1/25 showed EF 35-40%, mild LVH, moderate RV dysfunction, mild RV enlargement, PASP 57 mmHg, IVC dilated.   Found to be in AFL at follow up 1/25. Started on Eliquis , arranged for TEE-guided DCCV, however this was cancelled as he had URI symptoms and appeared to be back in NSR.  Today he returns for HF follow up. No recent hospitalizations. Overall feeling fine. Gets a little short of breath with exertion but not to bad. Denies PND/Orthopnea. Using CPAP. Appetite ok. No fever or chills. Exercising a few days a week with an online program through his PCP.  Taking all medications.   ReDs reading: 32%  Labs (4/23): K 3.8, creatinine 1.10 Labs (5/23): K 3.9, creatinine 1.12 Labs (7/23): K 4.2, creatinine 1.55, hgb 12 Labs (8/23): K  4.2, creatinine 1.16 Labs (1/25): K 3.5, creatinine 0.98    PMH: 1. VSD repair at 1.  2. Type 2 diabetes. 3. HTN 4. OSA: Not using CPAP.  5. Chronic systolic CHF: Nonischemic cardiomyopathy.  St Jude ICD.  - Echo 2009: EF 35-40% - LHC 2009: No angiographic coronary disease.  - Echo 9/11: LVEF 20-25%  - Echo 11/12: LVEF 30-35%, diffuse hypokinesis and mild LVH. RV systolic mildly reduced. LA mildly dilated. Pulmonic valve with mild stenosis.  - Echo 05/12/2014: EF 15-20% Grade IDD, LV moderately dilated.  RV normal.  Mild pulmonc stenosis mean peak gradient 14.  - Echo 7/16: EF 25-30%, mild LV dilation, mild LVH, trivial pulmonic stenosis with peak gradient 11 mmHg, mildly decreased RV function with mild RV dilation.   - CPX (3/16): RER 1.03, peak VO2 19.9, VE/VCO2 slope 18, mild to moderately impaired functional capacity.  There was moderate to severe mixed restrictive/obstructive pattern on spirometry => exertional hypoxemia, oxygen saturation decreased to 84%.  - RHC (4/16): mean RA 9, PA 35/12, mean PCWP 8, CI 1.8 Fick, CI 2.4 thermo.  - Echo 02/2106: EF 30-35% Grade IIDD  - Echo 3/20: EF 25%, mild LV dilation, diffuse hypokinesis, mildly decreased RV systolic function with mild RV dilation, no definite PS noted.  - CPX (10/20): VE/VCO2 16, peak VO2 15.8 (57% predicted), RER 1.08.  Moderate functional limitation primarily due to restrictive lung disease.  Severe restriction on PFTs.  - Echo (8/21): EF 30-35%, no significant residual VSD, normal RV.  - LHC/RHC (3/23): normal coronaries; mean RA 6, PA 47/27, mean PCWP 14, CI 3.28 - Echo (5/23): EF 35% w/ global HK, no residual VSD, RV normal w/ mildly elevated RVSP, 36 mmHG. Trival MR/TR. AoV not well visualized. No vegetations noted. - TEE (5/23): EF 35-40%, global hypokinesis, ICD vegetation, mild PS peak gradient 13 mmHg.  - Cardiac MRI (9/23):  LVEF 48%, RVEF 40%, nonspecific inferior RV insertion site LGE (suggestive of volume overload), no finding suggestive of cardia amyloidosis. - Echo (1/25): EF 35-40%, mild LVH, moderate RV dysfunction, mild RV enlargement, PASP 57 mmHg, IVC dilated.  6. Pulmonic stenosis: Mild, peak gradient 13 mmHg on TEE in 5/23.  7. High resolution CT chest in 8/21: No evidence for ILD.  8. MSSA  bacteremia: TEE 09/04/21 showed 0.5 x 0.9 cm vegetation on the ICD lead as it traverses the RA. No valvular vegetation noted. - s/p ICD extraction 5/23. 9. Atrial flutter: Diagnosed in 1/25.   Current Outpatient Medications  Medication  Sig Dispense Refill   albuterol  (VENTOLIN  HFA) 108 (90 Base) MCG/ACT inhaler Inhale 2 puffs into the lungs every 4 (four) hours as needed for shortness of breath or wheezing. 20.1 g 3   apixaban  (ELIQUIS ) 5 MG TABS tablet Take 1 tablet (5 mg total) by mouth 2 (two) times daily. 180 tablet 3   ascorbic acid (VITAMIN C) 500 MG tablet Take 500 mg by mouth daily.     carvedilol  (COREG ) 6.25 MG tablet Take 1 tablet (6.25 mg total) by mouth 2 (two) times daily with a meal. 180 tablet 3   cholecalciferol (VITAMIN D3) 25 MCG (1000 UNIT) tablet Take 1 tablet (25 mcg total) by mouth daily for Vitamin D deficiency 90 tablet 3   empagliflozin  (JARDIANCE ) 10 MG TABS tablet Take 1 tablet (10 mg total) by mouth daily. 30 tablet 11   fluticasone  furoate-vilanterol (BREO ELLIPTA ) 100-25 MCG/ACT AEPB Inhale 1 puff into the lungs daily. 60 each 11   HYDROcodone -acetaminophen  (  NORCO/VICODIN) 5-325 MG tablet Take 1 tablet by mouth 4 (four) times daily. 20 tablet 0   losartan  (COZAAR ) 25 MG tablet Take 0.5 tablets (12.5 mg total) by mouth daily. 45 tablet 3   potassium chloride  SA (KLOR-CON  M) 20 MEQ tablet Take 2 tablets (40 mEq total) by mouth daily. 180 tablet 3   simvastatin  (ZOCOR ) 20 MG tablet Take 1 tablet (20 mg total) by mouth every evening for hyperlipidemia. 60 tablet 1   spironolactone  (ALDACTONE ) 25 MG tablet Take 1 tablet (25 mg total) by mouth daily. 90 tablet 3   torsemide  (DEMADEX ) 20 MG tablet Take 3 tablets (60 mg total) by mouth daily. PLEASE KEEP SCHEDULED APPOINTMENT FOR FUTURE REFILLS 90 tablet 0   umeclidinium-vilanterol (ANORO ELLIPTA ) 62.5-25 MCG/ACT AEPB Inhale 1 puff into the lungs daily at the same time each day. 180 each 3   albuterol  (VENTOLIN  HFA) 108 (90 Base) MCG/ACT inhaler Inhale 1-2 puffs into the lungs every 1 - 2 hours for up to 3 doses, then every 2 to 4 hours, as needed for shortness of breath. Contact PCP if shortness of breath persists after 6 puffs. 6.7 g 3   simvastatin  (ZOCOR )  20 MG tablet Take 1 tablet (20 mg total) by mouth every evening for hyperlipidemia. 90 tablet 3   No current facility-administered medications for this encounter.    Allergies:   Metolazone    Social History:  The patient  reports that he quit smoking about 13 years ago. His smoking use included cigarettes. He started smoking about 28 years ago. He has a 4.5 pack-year smoking history. He has never used smokeless tobacco. He reports that he does not drink alcohol and does not use drugs.   Family History:  The patient's family history includes Diabetes in his maternal grandfather and paternal grandmother; Lung cancer in his father; Stroke in his father and mother.   ROS:  Please see the history of present illness.   All other systems are personally reviewed and negative.  Recent Labs: 06/12/2023: B Natriuretic Peptide 63.0; BUN 15; Creatinine, Ser 0.96; Potassium 4.2; Sodium 137  Personally reviewed   Wt Readings from Last 3 Encounters:  05/02/24 111.7 kg (246 lb 3.2 oz)  06/12/23 110.9 kg (244 lb 9.6 oz)  06/02/23 110 kg (242 lb 8.1 oz)   BP 90/64   Pulse 92   Wt 111.7 kg (246 lb 3.2 oz)   SpO2 94%   BMI 39.74 kg/m   Physical Exam General:   No resp difficulty Neck: no JVD.  Cor: Regular rate & rhythm.  Lungs: clear Abdomen: soft, nontender, nondistended.  Extremities: no  edema Neuro: alert & oriented x3   ReDs reading: 32 %, normal    Assessment/Plan 1. Chronic systolic CHF: Nonischemic cardiomyopathy.  RHC in 4/16 showed preserved cardiac index by thermodilution but low by Fick.   Echo in 8/21 showed EF 30-35%, normal RV, no residual VSD, no definite pulmonic stenosis.  Echo in 3/23 showed EF 30-35%, mild LVH, no VSD, no significant pulmonary stenosis, moderate RVE with normal RV systolic function. LHC/RHC in 3/23 with no coronary disease, CI 3.28.  He had a [employer] ICD. Echo during recent admission (5/23), looked similar to 3/23.  TEE (5/23) showed LV EF 35-40%, mild RV  dilation with moderate RV dysfunction, mild PS (mean gradient 13 mmHg), no VSD, there is a small 0.5 x 0.9 cm vegetation on the ICD lead as it traverses the RA, no valvular vegetation noted.  Now, s/p  ICD extraction. Cardiac MRI (9/23) showed improved LVEF 48%, RVEF 40%. Echo in 1/25 showed EF 35-40%, mild LVH, moderate RV dysfunction, mild RV enlargement, PASP 57 mmHg, IVC dilated.  Admission in 1/25 likely triggered by both medication noncompliance and new atrial flutter.  - NYHA II. Appears euvolemic.  Continue torsemide  60 mg alternating with 40 mg the next.   - Continue losartan  12.5 mg daily. - Continue Coreg  6.25 mg bid.  - Continue spironolactone  25 mg daily.  - Continue Jardiance  10 mg daily.  - Check BMET  -Repeat ECHO next visit.  2. H/o MSSA bacteremia: ? Source from RLE cellulitis. Had St Jude ICD => TEE 09/04/21 showed 0.5 x 0.9 cm vegetation on the ICD lead as it traverses the RA, no valvular vegetation noted. ICD extracted 09/05/21.   3. H/o VSD: No residual shunt noted 3/23 echo.  Last RHC also did not suggest any significant left to right shunting. TEE 5/23 showed no definite VSD.  4. Pulmonary stenosis: Mild on TEE in 5/23, peak gradient 13 mmHg.  5. OSA: Continue CPAP 6. Atrial flutter:  SR today.  - Continue apixaban  5 mg bid.  7. Hyperlipidemia: LDL 50 1/25 8. Noncompliance:Seems to be doing much better with medication compliance.   Follow up 3 months with an ECHO and Dr Rolan Greig Mosses NP-C 05/02/2024 "

## 2024-05-02 ENCOUNTER — Ambulatory Visit (HOSPITAL_COMMUNITY)
Admission: RE | Admit: 2024-05-02 | Discharge: 2024-05-02 | Disposition: A | Source: Ambulatory Visit | Attending: Adult Health | Admitting: Adult Health

## 2024-05-02 ENCOUNTER — Ambulatory Visit (HOSPITAL_COMMUNITY): Payer: Self-pay | Admitting: Adult Health

## 2024-05-02 VITALS — BP 90/64 | HR 92 | Wt 246.2 lb

## 2024-05-02 DIAGNOSIS — I428 Other cardiomyopathies: Secondary | ICD-10-CM | POA: Insufficient documentation

## 2024-05-02 DIAGNOSIS — I110 Hypertensive heart disease with heart failure: Secondary | ICD-10-CM | POA: Diagnosis present

## 2024-05-02 DIAGNOSIS — I452 Bifascicular block: Secondary | ICD-10-CM | POA: Diagnosis not present

## 2024-05-02 DIAGNOSIS — G4733 Obstructive sleep apnea (adult) (pediatric): Secondary | ICD-10-CM | POA: Insufficient documentation

## 2024-05-02 DIAGNOSIS — E119 Type 2 diabetes mellitus without complications: Secondary | ICD-10-CM | POA: Diagnosis not present

## 2024-05-02 DIAGNOSIS — I370 Nonrheumatic pulmonary valve stenosis: Secondary | ICD-10-CM | POA: Diagnosis not present

## 2024-05-02 DIAGNOSIS — I5022 Chronic systolic (congestive) heart failure: Secondary | ICD-10-CM | POA: Diagnosis not present

## 2024-05-02 DIAGNOSIS — Z7984 Long term (current) use of oral hypoglycemic drugs: Secondary | ICD-10-CM | POA: Insufficient documentation

## 2024-05-02 DIAGNOSIS — I4892 Unspecified atrial flutter: Secondary | ICD-10-CM | POA: Insufficient documentation

## 2024-05-02 DIAGNOSIS — Z79899 Other long term (current) drug therapy: Secondary | ICD-10-CM | POA: Insufficient documentation

## 2024-05-02 DIAGNOSIS — Z8774 Personal history of (corrected) congenital malformations of heart and circulatory system: Secondary | ICD-10-CM | POA: Insufficient documentation

## 2024-05-02 DIAGNOSIS — Z7901 Long term (current) use of anticoagulants: Secondary | ICD-10-CM | POA: Diagnosis not present

## 2024-05-02 DIAGNOSIS — E785 Hyperlipidemia, unspecified: Secondary | ICD-10-CM | POA: Diagnosis not present

## 2024-05-02 LAB — BASIC METABOLIC PANEL WITH GFR
Anion gap: 10 (ref 5–15)
BUN: 14 mg/dL (ref 6–20)
CO2: 32 mmol/L (ref 22–32)
Calcium: 8.9 mg/dL (ref 8.9–10.3)
Chloride: 94 mmol/L — ABNORMAL LOW (ref 98–111)
Creatinine, Ser: 0.95 mg/dL (ref 0.61–1.24)
GFR, Estimated: >60 mL/min
Glucose, Bld: 117 mg/dL — ABNORMAL HIGH (ref 70–99)
Potassium: 4.2 mmol/L (ref 3.5–5.1)
Sodium: 137 mmol/L (ref 135–145)

## 2024-05-02 NOTE — Patient Instructions (Signed)
 No change in medications. Labs today - will call you if abnormal. Return to see Dr. Rolan with echo in 3 months - see below. Please call us  at [phone] if any questions or concerns prior to your next appointment.

## 2024-05-02 NOTE — Progress Notes (Signed)
"   ReDS Vest / Clip - 05/02/24 1100       ReDS Vest / Clip   Station Marker B    Ruler Value 40    ReDS Value Range Low volume    ReDS Actual Value 32          "

## 2024-05-04 ENCOUNTER — Other Ambulatory Visit (HOSPITAL_COMMUNITY): Payer: Self-pay

## 2024-08-08 ENCOUNTER — Ambulatory Visit (HOSPITAL_COMMUNITY): Admitting: Cardiology

## 2024-08-08 ENCOUNTER — Other Ambulatory Visit (HOSPITAL_COMMUNITY)
